# Patient Record
Sex: Female | Born: 1955 | Race: White | Hispanic: No | Marital: Married | Smoking: Never smoker
Health system: Southern US, Community
[De-identification: ages and names within clinical notes are randomized; demographics above are authoritative.]

## PROBLEM LIST (undated history)

## (undated) DIAGNOSIS — G4700 Insomnia, unspecified: Secondary | ICD-10-CM

## (undated) DIAGNOSIS — E559 Vitamin D deficiency, unspecified: Secondary | ICD-10-CM

## (undated) DIAGNOSIS — N943 Premenstrual tension syndrome: Secondary | ICD-10-CM

## (undated) DIAGNOSIS — L409 Psoriasis, unspecified: Secondary | ICD-10-CM

## (undated) DIAGNOSIS — Z9889 Other specified postprocedural states: Secondary | ICD-10-CM

## (undated) DIAGNOSIS — K219 Gastro-esophageal reflux disease without esophagitis: Secondary | ICD-10-CM

## (undated) DIAGNOSIS — J40 Bronchitis, not specified as acute or chronic: Secondary | ICD-10-CM

## (undated) DIAGNOSIS — R112 Nausea with vomiting, unspecified: Secondary | ICD-10-CM

## (undated) DIAGNOSIS — J329 Chronic sinusitis, unspecified: Secondary | ICD-10-CM

## (undated) DIAGNOSIS — K222 Esophageal obstruction: Secondary | ICD-10-CM

## (undated) DIAGNOSIS — K449 Diaphragmatic hernia without obstruction or gangrene: Secondary | ICD-10-CM

## (undated) DIAGNOSIS — M1990 Unspecified osteoarthritis, unspecified site: Secondary | ICD-10-CM

## (undated) DIAGNOSIS — E210 Primary hyperparathyroidism: Secondary | ICD-10-CM

## (undated) DIAGNOSIS — K5730 Diverticulosis of large intestine without perforation or abscess without bleeding: Secondary | ICD-10-CM

## (undated) DIAGNOSIS — Z8719 Personal history of other diseases of the digestive system: Secondary | ICD-10-CM

## (undated) DIAGNOSIS — F419 Anxiety disorder, unspecified: Secondary | ICD-10-CM

## (undated) DIAGNOSIS — J45909 Unspecified asthma, uncomplicated: Secondary | ICD-10-CM

## (undated) DIAGNOSIS — D649 Anemia, unspecified: Secondary | ICD-10-CM

## (undated) DIAGNOSIS — L4050 Arthropathic psoriasis, unspecified: Secondary | ICD-10-CM

## (undated) DIAGNOSIS — J189 Pneumonia, unspecified organism: Secondary | ICD-10-CM

## (undated) DIAGNOSIS — D131 Benign neoplasm of stomach: Secondary | ICD-10-CM

## (undated) HISTORY — DX: Other specified postprocedural states: Z98.890

## (undated) HISTORY — DX: Diverticulosis of large intestine without perforation or abscess without bleeding: K57.30

## (undated) HISTORY — DX: Unspecified asthma, uncomplicated: J45.909

## (undated) HISTORY — DX: Bronchitis, not specified as acute or chronic: J40

## (undated) HISTORY — PX: ENDOMETRIAL ABLATION: SHX621

## (undated) HISTORY — DX: Unspecified osteoarthritis, unspecified site: M19.90

## (undated) HISTORY — DX: Pneumonia, unspecified organism: J18.9

## (undated) HISTORY — PX: NASAL SINUS SURGERY: SHX719

## (undated) HISTORY — DX: Chronic sinusitis, unspecified: J32.9

## (undated) HISTORY — DX: Diaphragmatic hernia without obstruction or gangrene: K44.9

## (undated) HISTORY — DX: Vitamin D deficiency, unspecified: E55.9

## (undated) HISTORY — DX: Premenstrual tension syndrome: N94.3

## (undated) HISTORY — DX: Esophageal obstruction: K22.2

## (undated) HISTORY — DX: Gastro-esophageal reflux disease without esophagitis: K21.9

## (undated) HISTORY — DX: Arthropathic psoriasis, unspecified: L40.50

## (undated) HISTORY — DX: Psoriasis, unspecified: L40.9

## (undated) HISTORY — DX: Anxiety disorder, unspecified: F41.9

## (undated) HISTORY — DX: Personal history of other diseases of the digestive system: Z87.19

## (undated) HISTORY — DX: Insomnia, unspecified: G47.00

## (undated) HISTORY — DX: Primary hyperparathyroidism: E21.0

## (undated) HISTORY — DX: Benign neoplasm of stomach: D13.1

---

## 1998-04-03 ENCOUNTER — Ambulatory Visit (HOSPITAL_COMMUNITY): Admission: RE | Admit: 1998-04-03 | Discharge: 1998-04-03 | Payer: Self-pay | Admitting: Gynecology

## 1998-10-28 ENCOUNTER — Other Ambulatory Visit: Admission: RE | Admit: 1998-10-28 | Discharge: 1998-10-28 | Payer: Self-pay | Admitting: Gynecology

## 1999-06-23 ENCOUNTER — Ambulatory Visit (HOSPITAL_COMMUNITY): Admission: RE | Admit: 1999-06-23 | Discharge: 1999-06-23 | Payer: Self-pay | Admitting: Gynecology

## 1999-06-23 ENCOUNTER — Encounter: Payer: Self-pay | Admitting: Gynecology

## 1999-07-20 ENCOUNTER — Encounter: Payer: Self-pay | Admitting: Gynecology

## 1999-07-20 ENCOUNTER — Ambulatory Visit (HOSPITAL_COMMUNITY): Admission: RE | Admit: 1999-07-20 | Discharge: 1999-07-20 | Payer: Self-pay | Admitting: Gynecology

## 2000-05-26 ENCOUNTER — Other Ambulatory Visit: Admission: RE | Admit: 2000-05-26 | Discharge: 2000-05-26 | Payer: Self-pay | Admitting: Obstetrics and Gynecology

## 2000-08-22 ENCOUNTER — Encounter: Payer: Self-pay | Admitting: Obstetrics and Gynecology

## 2000-08-22 ENCOUNTER — Ambulatory Visit (HOSPITAL_COMMUNITY): Admission: RE | Admit: 2000-08-22 | Discharge: 2000-08-22 | Payer: Self-pay | Admitting: Obstetrics and Gynecology

## 2000-10-25 ENCOUNTER — Encounter: Admission: RE | Admit: 2000-10-25 | Discharge: 2001-01-23 | Payer: Self-pay | Admitting: Gynecology

## 2000-11-07 ENCOUNTER — Other Ambulatory Visit: Admission: RE | Admit: 2000-11-07 | Discharge: 2000-11-07 | Payer: Self-pay | Admitting: Gynecology

## 2001-05-01 ENCOUNTER — Inpatient Hospital Stay (HOSPITAL_COMMUNITY): Admission: AD | Admit: 2001-05-01 | Discharge: 2001-05-04 | Payer: Self-pay | Admitting: Gynecology

## 2001-05-01 ENCOUNTER — Encounter (INDEPENDENT_AMBULATORY_CARE_PROVIDER_SITE_OTHER): Payer: Self-pay

## 2001-05-01 ENCOUNTER — Encounter: Payer: Self-pay | Admitting: Gynecology

## 2001-05-05 ENCOUNTER — Encounter: Admission: RE | Admit: 2001-05-05 | Discharge: 2001-06-04 | Payer: Self-pay | Admitting: Gynecology

## 2001-06-11 ENCOUNTER — Other Ambulatory Visit: Admission: RE | Admit: 2001-06-11 | Discharge: 2001-06-11 | Payer: Self-pay | Admitting: Gynecology

## 2001-09-04 ENCOUNTER — Encounter: Payer: Self-pay | Admitting: Gynecology

## 2001-09-04 ENCOUNTER — Ambulatory Visit (HOSPITAL_COMMUNITY): Admission: RE | Admit: 2001-09-04 | Discharge: 2001-09-04 | Payer: Self-pay | Admitting: Gynecology

## 2002-09-17 ENCOUNTER — Encounter: Payer: Self-pay | Admitting: Gynecology

## 2002-09-17 ENCOUNTER — Ambulatory Visit (HOSPITAL_COMMUNITY): Admission: RE | Admit: 2002-09-17 | Discharge: 2002-09-17 | Payer: Self-pay | Admitting: Gynecology

## 2002-10-02 ENCOUNTER — Other Ambulatory Visit: Admission: RE | Admit: 2002-10-02 | Discharge: 2002-10-02 | Payer: Self-pay | Admitting: Gynecology

## 2002-12-19 HISTORY — PX: TUBAL LIGATION: SHX77

## 2003-12-30 ENCOUNTER — Other Ambulatory Visit: Admission: RE | Admit: 2003-12-30 | Discharge: 2003-12-30 | Payer: Self-pay | Admitting: Gynecology

## 2004-01-06 ENCOUNTER — Ambulatory Visit (HOSPITAL_COMMUNITY): Admission: RE | Admit: 2004-01-06 | Discharge: 2004-01-06 | Payer: Self-pay | Admitting: Gynecology

## 2004-02-27 ENCOUNTER — Ambulatory Visit (HOSPITAL_BASED_OUTPATIENT_CLINIC_OR_DEPARTMENT_OTHER): Admission: RE | Admit: 2004-02-27 | Discharge: 2004-02-27 | Payer: Self-pay | Admitting: Gynecology

## 2005-01-25 ENCOUNTER — Ambulatory Visit (HOSPITAL_COMMUNITY): Admission: RE | Admit: 2005-01-25 | Discharge: 2005-01-25 | Payer: Self-pay | Admitting: Gynecology

## 2005-02-11 ENCOUNTER — Other Ambulatory Visit: Admission: RE | Admit: 2005-02-11 | Discharge: 2005-02-11 | Payer: Self-pay | Admitting: Gynecology

## 2006-02-17 ENCOUNTER — Other Ambulatory Visit: Admission: RE | Admit: 2006-02-17 | Discharge: 2006-02-17 | Payer: Self-pay | Admitting: Gynecology

## 2006-03-06 ENCOUNTER — Ambulatory Visit (HOSPITAL_COMMUNITY): Admission: RE | Admit: 2006-03-06 | Discharge: 2006-03-06 | Payer: Self-pay | Admitting: Gynecology

## 2007-03-08 ENCOUNTER — Ambulatory Visit (HOSPITAL_COMMUNITY): Admission: RE | Admit: 2007-03-08 | Discharge: 2007-03-08 | Payer: Self-pay | Admitting: Gynecology

## 2007-03-14 ENCOUNTER — Other Ambulatory Visit: Admission: RE | Admit: 2007-03-14 | Discharge: 2007-03-14 | Payer: Self-pay | Admitting: Gynecology

## 2007-11-21 ENCOUNTER — Ambulatory Visit: Payer: Self-pay | Admitting: Internal Medicine

## 2007-12-06 ENCOUNTER — Encounter: Payer: Self-pay | Admitting: Internal Medicine

## 2007-12-06 ENCOUNTER — Ambulatory Visit: Payer: Self-pay | Admitting: Internal Medicine

## 2007-12-06 DIAGNOSIS — D131 Benign neoplasm of stomach: Secondary | ICD-10-CM | POA: Insufficient documentation

## 2007-12-06 DIAGNOSIS — K449 Diaphragmatic hernia without obstruction or gangrene: Secondary | ICD-10-CM | POA: Insufficient documentation

## 2007-12-06 DIAGNOSIS — K222 Esophageal obstruction: Secondary | ICD-10-CM | POA: Insufficient documentation

## 2007-12-19 LAB — HM COLONOSCOPY

## 2008-01-04 DIAGNOSIS — K5730 Diverticulosis of large intestine without perforation or abscess without bleeding: Secondary | ICD-10-CM | POA: Insufficient documentation

## 2008-01-04 DIAGNOSIS — K219 Gastro-esophageal reflux disease without esophagitis: Secondary | ICD-10-CM | POA: Insufficient documentation

## 2008-01-04 DIAGNOSIS — J45909 Unspecified asthma, uncomplicated: Secondary | ICD-10-CM | POA: Insufficient documentation

## 2008-03-28 ENCOUNTER — Ambulatory Visit (HOSPITAL_COMMUNITY): Admission: RE | Admit: 2008-03-28 | Discharge: 2008-03-28 | Payer: Self-pay | Admitting: Gynecology

## 2008-04-02 ENCOUNTER — Other Ambulatory Visit: Admission: RE | Admit: 2008-04-02 | Discharge: 2008-04-02 | Payer: Self-pay | Admitting: Gynecology

## 2008-04-17 LAB — CONVERTED CEMR LAB: Pap Smear: NORMAL

## 2008-09-19 ENCOUNTER — Ambulatory Visit: Payer: Self-pay | Admitting: *Deleted

## 2008-09-19 DIAGNOSIS — J309 Allergic rhinitis, unspecified: Secondary | ICD-10-CM | POA: Insufficient documentation

## 2008-09-19 DIAGNOSIS — B3731 Acute candidiasis of vulva and vagina: Secondary | ICD-10-CM | POA: Insufficient documentation

## 2008-09-19 DIAGNOSIS — M1990 Unspecified osteoarthritis, unspecified site: Secondary | ICD-10-CM | POA: Insufficient documentation

## 2008-09-19 DIAGNOSIS — B373 Candidiasis of vulva and vagina: Secondary | ICD-10-CM | POA: Insufficient documentation

## 2008-09-19 DIAGNOSIS — G4700 Insomnia, unspecified: Secondary | ICD-10-CM | POA: Insufficient documentation

## 2008-09-19 DIAGNOSIS — N943 Premenstrual tension syndrome: Secondary | ICD-10-CM | POA: Insufficient documentation

## 2008-10-16 ENCOUNTER — Ambulatory Visit: Payer: Self-pay | Admitting: *Deleted

## 2008-10-16 DIAGNOSIS — F329 Major depressive disorder, single episode, unspecified: Secondary | ICD-10-CM | POA: Insufficient documentation

## 2008-10-16 DIAGNOSIS — F419 Anxiety disorder, unspecified: Secondary | ICD-10-CM | POA: Insufficient documentation

## 2008-10-16 DIAGNOSIS — F3289 Other specified depressive episodes: Secondary | ICD-10-CM | POA: Insufficient documentation

## 2008-10-16 LAB — CONVERTED CEMR LAB
ALT: 24 units/L (ref 0–35)
AST: 20 units/L (ref 0–37)
Albumin: 3.8 g/dL (ref 3.5–5.2)
Alkaline Phosphatase: 60 units/L (ref 39–117)
BUN: 13 mg/dL (ref 6–23)
Basophils Absolute: 0.1 10*3/uL (ref 0.0–0.1)
Basophils Relative: 0.9 % (ref 0.0–3.0)
CO2: 30 meq/L (ref 19–32)
Calcium: 9.4 mg/dL (ref 8.4–10.5)
Chloride: 105 meq/L (ref 96–112)
Cholesterol: 193 mg/dL (ref 0–200)
Creatinine, Ser: 0.8 mg/dL (ref 0.4–1.2)
Eosinophils Absolute: 0.5 10*3/uL (ref 0.0–0.7)
Eosinophils Relative: 7.2 % — ABNORMAL HIGH (ref 0.0–5.0)
GFR calc Af Amer: 97 mL/min
GFR calc non Af Amer: 80 mL/min
Glucose, Bld: 101 mg/dL — ABNORMAL HIGH (ref 70–99)
HCT: 37.8 % (ref 36.0–46.0)
HDL: 48.8 mg/dL (ref >39.0)
Hemoglobin: 13.2 g/dL (ref 12.0–15.0)
LDL Cholesterol: 123 mg/dL — ABNORMAL HIGH (ref 0–99)
Lymphocytes Relative: 21.8 % (ref 12.0–46.0)
MCHC: 34.9 g/dL (ref 30.0–36.0)
MCV: 84.8 fL (ref 78.0–100.0)
Monocytes Absolute: 0.6 10*3/uL (ref 0.1–1.0)
Monocytes Relative: 9.1 % (ref 3.0–12.0)
Neutro Abs: 4 10*3/uL (ref 1.4–7.7)
Neutrophils Relative %: 61 % (ref 43.0–77.0)
Platelets: 229 10*3/uL (ref 150–400)
Potassium: 4.5 meq/L (ref 3.5–5.1)
RBC: 4.46 M/uL (ref 3.87–5.11)
RDW: 13 % (ref 11.5–14.6)
Sodium: 141 meq/L (ref 135–145)
TSH: 0.86 microintl units/mL (ref 0.35–5.50)
Total Bilirubin: 0.5 mg/dL (ref 0.3–1.2)
Total CHOL/HDL Ratio: 4
Total Protein: 7.1 g/dL (ref 6.0–8.3)
Triglycerides: 108 mg/dL (ref 0–149)
VLDL: 22 mg/dL (ref 0–40)
WBC: 6.6 10*3/uL (ref 4.5–10.5)

## 2008-10-20 DIAGNOSIS — E739 Lactose intolerance, unspecified: Secondary | ICD-10-CM | POA: Insufficient documentation

## 2008-11-25 ENCOUNTER — Ambulatory Visit: Payer: Self-pay | Admitting: *Deleted

## 2008-12-03 ENCOUNTER — Telehealth (INDEPENDENT_AMBULATORY_CARE_PROVIDER_SITE_OTHER): Payer: Self-pay | Admitting: *Deleted

## 2008-12-08 ENCOUNTER — Telehealth (INDEPENDENT_AMBULATORY_CARE_PROVIDER_SITE_OTHER): Payer: Self-pay | Admitting: *Deleted

## 2008-12-09 ENCOUNTER — Ambulatory Visit: Payer: Self-pay | Admitting: Gynecology

## 2008-12-16 ENCOUNTER — Ambulatory Visit: Payer: Self-pay | Admitting: *Deleted

## 2009-01-02 ENCOUNTER — Telehealth (INDEPENDENT_AMBULATORY_CARE_PROVIDER_SITE_OTHER): Payer: Self-pay | Admitting: *Deleted

## 2009-04-10 ENCOUNTER — Telehealth: Payer: Self-pay | Admitting: Internal Medicine

## 2009-04-16 ENCOUNTER — Ambulatory Visit: Payer: Self-pay | Admitting: Internal Medicine

## 2009-06-26 ENCOUNTER — Telehealth: Payer: Self-pay | Admitting: Internal Medicine

## 2009-07-17 ENCOUNTER — Ambulatory Visit (HOSPITAL_COMMUNITY): Admission: RE | Admit: 2009-07-17 | Discharge: 2009-07-17 | Payer: Self-pay | Admitting: Gynecology

## 2009-07-17 LAB — HM MAMMOGRAPHY: HM Mammogram: NORMAL

## 2009-07-21 ENCOUNTER — Encounter: Payer: Self-pay | Admitting: Gynecology

## 2009-07-21 ENCOUNTER — Ambulatory Visit: Payer: Self-pay | Admitting: Gynecology

## 2009-07-21 ENCOUNTER — Other Ambulatory Visit: Admission: RE | Admit: 2009-07-21 | Discharge: 2009-07-21 | Payer: Self-pay | Admitting: Gynecology

## 2009-07-21 LAB — CONVERTED CEMR LAB

## 2009-07-23 ENCOUNTER — Ambulatory Visit: Payer: Self-pay | Admitting: Internal Medicine

## 2009-07-23 DIAGNOSIS — G4700 Insomnia, unspecified: Secondary | ICD-10-CM | POA: Insufficient documentation

## 2009-08-27 ENCOUNTER — Telehealth: Payer: Self-pay | Admitting: Internal Medicine

## 2009-09-25 ENCOUNTER — Ambulatory Visit: Payer: Self-pay | Admitting: Internal Medicine

## 2009-09-29 ENCOUNTER — Telehealth: Payer: Self-pay | Admitting: Internal Medicine

## 2009-11-18 HISTORY — PX: TOTAL KNEE ARTHROPLASTY: SHX125

## 2009-11-26 ENCOUNTER — Ambulatory Visit: Payer: Self-pay | Admitting: Internal Medicine

## 2010-01-22 ENCOUNTER — Ambulatory Visit: Payer: Self-pay | Admitting: Internal Medicine

## 2010-01-29 ENCOUNTER — Telehealth: Payer: Self-pay | Admitting: Internal Medicine

## 2010-05-03 ENCOUNTER — Ambulatory Visit: Payer: Self-pay | Admitting: Family

## 2010-06-10 ENCOUNTER — Ambulatory Visit: Payer: Self-pay | Admitting: Internal Medicine

## 2010-06-10 DIAGNOSIS — N390 Urinary tract infection, site not specified: Secondary | ICD-10-CM | POA: Insufficient documentation

## 2010-06-10 LAB — CONVERTED CEMR LAB
Bilirubin Urine: NEGATIVE
Glucose, Urine, Semiquant: NEGATIVE
Ketones, urine, test strip: NEGATIVE
Nitrite: NEGATIVE
Protein, U semiquant: 100
Specific Gravity, Urine: 1.01
Urobilinogen, UA: 0.2
pH: 5

## 2010-06-11 ENCOUNTER — Encounter: Payer: Self-pay | Admitting: Internal Medicine

## 2010-07-16 ENCOUNTER — Telehealth: Payer: Self-pay | Admitting: Internal Medicine

## 2010-07-16 ENCOUNTER — Ambulatory Visit: Payer: Self-pay | Admitting: Internal Medicine

## 2010-07-16 LAB — CONVERTED CEMR LAB
Bilirubin Urine: NEGATIVE
Glucose, Urine, Semiquant: NEGATIVE
Ketones, urine, test strip: NEGATIVE
Nitrite: NEGATIVE
Protein, U semiquant: 30
Specific Gravity, Urine: 1.01
Urobilinogen, UA: 0.2
pH: 7

## 2010-08-30 ENCOUNTER — Ambulatory Visit: Payer: Self-pay | Admitting: Internal Medicine

## 2010-09-17 ENCOUNTER — Ambulatory Visit: Payer: Self-pay | Admitting: Family

## 2010-11-15 ENCOUNTER — Telehealth: Payer: Self-pay | Admitting: Family

## 2010-12-02 ENCOUNTER — Other Ambulatory Visit
Admission: RE | Admit: 2010-12-02 | Discharge: 2010-12-02 | Payer: Self-pay | Source: Home / Self Care | Admitting: Gynecology

## 2010-12-02 ENCOUNTER — Ambulatory Visit: Payer: Self-pay | Admitting: Gynecology

## 2010-12-16 ENCOUNTER — Ambulatory Visit: Payer: Self-pay | Admitting: Gynecology

## 2010-12-16 ENCOUNTER — Ambulatory Visit (HOSPITAL_COMMUNITY)
Admission: RE | Admit: 2010-12-16 | Discharge: 2010-12-16 | Payer: Self-pay | Source: Home / Self Care | Attending: Gynecology | Admitting: Gynecology

## 2010-12-17 ENCOUNTER — Ambulatory Visit: Admit: 2010-12-17 | Payer: Self-pay | Admitting: Family

## 2010-12-17 ENCOUNTER — Ambulatory Visit: Payer: Self-pay | Admitting: Gynecology

## 2011-01-10 ENCOUNTER — Encounter: Payer: Self-pay | Admitting: Gynecology

## 2011-01-14 ENCOUNTER — Ambulatory Visit: Admit: 2011-01-14 | Payer: Self-pay | Admitting: Gynecology

## 2011-01-16 LAB — CONVERTED CEMR LAB: Glucose, Bld: 99 mg/dL (ref 70–99)

## 2011-01-20 NOTE — Assessment & Plan Note (Signed)
Summary: uti/mhf   Vital Signs:  Patient profile:   55 year old female Height:      65.5 inches Weight:      186.50 pounds BMI:     30.67 O2 Sat:      99 % on Room air Temp:     97.5 degrees F oral Pulse rate:   86 / minute Pulse rhythm:   regular Resp:     18 per minute BP sitting:   106 / 70  (right arm) Cuff size:   large  Vitals Entered By: Glendell Docker CMA (June 10, 2010 2:19 PM)  O2 Flow:  Room air CC: Rm 3-  pain with urination, Dysuria Comments pain with urination , frequency and urgency onset 3 days ago, no longer taking Hydrocodone and Azithromycin is completed   Primary Care Provider:  DThomos Lemons DO  CC:  Rm 3-  pain with urination and Dysuria.  History of Present Illness:  Dysuria      This is a 55 year old woman who presents with Dysuria.  The symptoms began 3 days ago.  The patient reports urinary frequency.  The patient denies the following associated symptoms: vomiting, fever, shaking chills, and flank pain.    Allergies: 1)  ! 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   Lungs:  normal respiratory effort and normal breath sounds.   Heart:  normal rate, regular rhythm, and no gallop.   Abdomen:  soft, non-tender, and no masses.  no flank tenderness Extremities:  No lower extremity edema    Impression &  Recommendations:  Problem # 1:  UTI (ICD-599.0)  The following medications were removed from the medication list:    Azithromycin 250 Mg Tabs (Azithromycin) .Marland Kitchen... 2 tabs on day 1, then one by mouth once daily Her updated medication list for this problem includes:    Ciprofloxacin Hcl 500 Mg Tabs (Ciprofloxacin hcl) ..... One by mouth two times a day  Orders: T-Culture, Urine ([id number]) UA Dipstick w/o Micro (manual) (08657) Specimen Handling (99000)  Encouraged to push clear liquids, get enough rest, and take acetaminophen as needed. To be seen in 10 days if no improvement, sooner if worse.  Complete Medication List: 1)  Nexium 40 Mg Cpdr (Esomeprazole magnesium) .... Take 1 cap by mouth once a day 2)  Trazodone Hcl 100 Mg Tabs (Trazodone hcl) .... Take 1/2 to one  tab by mouth at bedtime as needed 3)  Singulair 10 Mg Tabs (Montelukast sodium) .... Take 1 tablet by mouth once a day 4)  Alprazolam 0.25 Mg Tabs (Alprazolam) .... As needed 5)  Spironolactone 25 Mg Tabs (Spironolactone) .... Take 1 tablet by mouth once a day as needed 6)  Meloxicam 7.5 Mg Tabs (Meloxicam) .... Take 1 tablet by mouth once a day 7)  Zyrtec Allergy 10 Mg Caps (Cetirizine hcl) .... Take 1 capsule by mouth once a day as needed 8)  Hydrocodone-acetaminophen  7.5-325 Mg Tabs (Hydrocodone-acetaminophen) .... One tablet every 6 hours as needed pain 9)  Ciprofloxacin Hcl 500 Mg Tabs (Ciprofloxacin hcl) .... One by mouth two times a day  Patient Instructions: 1)  Call our office if your symptoms do not  improve or gets worse. Prescriptions: CIPROFLOXACIN HCL 500 MG TABS (CIPROFLOXACIN HCL) one by mouth two times a day  #14 x 0   Entered and Authorized by:   D. Thomos Lemons DO   Signed by:   D. Thomos Lemons DO on 06/10/2010   Method used:   [facility] to        [facility]  [address] [phone]* (retail)       [address]       [place], [place]  [address]       Ph: [phone] or [phone]        Fax: [phone]   RxID:   [id number]   Laboratory Results   Urine Tests    Routine Urinalysis   Color: yellow Appearance: Clear Glucose: negative   (Normal Range: Negative) Bilirubin: negative   (Normal Range: Negative) Ketone: negative   (Normal Range: Negative) Spec. Gravity: 1.010   (Normal Range: 1.003-1.035) Blood: large   (Normal Range: Negative) pH: 5.0   (Normal Range: 5.0-8.0) Protein: 100   (Normal Range: Negative) Urobilinogen: 0.2   (Normal Range: 0-1) Nitrite: negative   (Normal Range: Negative) Leukocyte Esterace: large   (Normal Range: Negative)

## 2011-01-20 NOTE — Procedures (Signed)
Summary: Gastroenterology EGD  Gastroenterology EGD   Imported By: Lorre Munroe RN 01/07/2008 15:31:34  _____________________________________________________________________  External Attachment:    Type:   Image     Comment:   External Document

## 2011-01-20 NOTE — Progress Notes (Signed)
Summary: REPLACEMENT RX  Phone Note Refill Request Message from:  Fax from Pharmacy on November 15, 2010 3:01 PM  Refills Requested: Medication #1:  CLOTRIMAZOLE-BETAMETHASONE 1-0.05 % CREA apply two times a day as directed   Dosage confirmed as above?Dosage Confirmed   Brand Name Necessary? No   Supply Requested: 1 month THIS IS ON MANUFACTURERS BACK ORDER TILL NEXT YEAR.  PLEASE SEND RX FOR SUBSTITUTE [facility] FAX [phone] PHONE [phone]   Method Requested: Electronic Next Appointment Scheduled: NONE Initial call taken by: Roselle Locus,  November 15, 2010 3:02 PM  Follow-up for Phone Call        Please call patient and ask her what she is using cream for and I will try to come up with an alternative.  This one is on back order at the pharmacy. Follow-up by: Lemont Fillers FNP,  November 15, 2010 4:00 PM  Additional Follow-up for Phone Call Additional follow up Details #1::        Left message on machine to return my call. Nicki Guadalajara Fergerson CMA Duncan Dull)  November 15, 2010 4:20 PM     Additional Follow-up for Phone Call Additional follow up Details #2::    Pt states she uses this for eczema but was told the Clarithromycin was on back order as she was requesting a refill on the antibiotic, not the cream. [facility] and [facility] verified that Clotrimazole Betameth Cream is not on back order but the Clarithromycin is. No substitute given for antibiotic as pt will need to be seen. Pt is aware and states she will call us back if she feels she needs an appt.    Nicki Guadalajara Fergerson CMA Duncan Dull)  November 16, 2010 10:54 AM

## 2011-01-20 NOTE — Progress Notes (Signed)
Summary: Trazadone  Phone Note Refill Request Message from:  Fax from Pharmacy on June 26, 2009 4:53 PM  Refills Requested: Medication #1:  TRAZODONE HCL 100 MG TABS one tab by mouth at bedtime   Dosage confirmed as above?Dosage Confirmed   Brand Name Necessary? No   Supply Requested: 3 months   Last Refilled: 03/23/2009  Method Requested: Telephone to Pharmacy Next Appointment Scheduled: No Future Appointments on file Initial call taken by: Glendell Docker CMA,  June 26, 2009 4:53 PM  Follow-up for Phone Call        refill x 1 month only.  needs OV for refills Follow-up by: D. Thomos Lemons DO,  June 26, 2009 4:59 PM  Additional Follow-up for Phone Call Additional follow up Details #1::        Rx called to pharmacy    Additional Follow-up for Phone Call Additional follow up Details #2::    Patient advised per Dr Artist Pais instructions, she states that she has been taking this medication for years and it is more cost effective for her to get a 90 day supply. She was made aware that office visit is needed for refills patient is not in agreement, she states she has felt like she has been seen on a regualr basis.   Follow-up by: Glendell Docker CMA,  June 29, 2009 11:58 AM  Additional Follow-up for Phone Call Additional follow up Details #3:: Details for Additional Follow-up Action Taken: [facility] for 90 day supply with 1 refill Additional Follow-up by: D. Thomos Lemons DO,  July 13, 2009 9:23 AM  New/Updated Medications: TRAZODONE HCL 100 MG TABS (TRAZODONE HCL) Take 1 tab by mouth at bedtime as needed  Rx changed to 90 day supply per Dr Artist Pais ok, patient has been advised Glendell Docker CMA  July 13, 2009 10:54 AM   Prescriptions: TRAZODONE HCL 100 MG TABS (TRAZODONE HCL) Take 1 tab by mouth at bedtime as needed  #90 x 1   Entered by:   Glendell Docker CMA   Authorized by:   D. Thomos Lemons DO   Signed by:   Glendell Docker CMA on 07/13/2009   Method used:   Telephoned to ...       [facility]  [address]  [phone]* (retail)       [address]       [place], [place]  [address]       Ph: [phone] or [phone]       Fax: [phone]   RxID:   [id number] TRAZODONE HCL 100 MG TABS (TRAZODONE HCL) Take 1 tab by mouth at bedtime as needed  #30 x 0   Entered by:   Glendell Docker CMA   Authorized by:   D. Thomos Lemons DO   Signed by:   Glendell Docker CMA on 06/29/2009   Method used:   Telephoned to ...       [facility]  [address] [phone]* (retail)       [address]       [place], [place]  [address]       Ph: [phone] or [phone]       Fax: [phone]   RxID:   [id number]

## 2011-01-20 NOTE — Assessment & Plan Note (Signed)
Summary: f/u, fasting labs   Vital Signs:  Patient Profile:   55 Years Old Female Height:     65.5 inches Weight:      189 pounds BMI:     31.08 O2 treatment:    Room Air Temp:     97.7 degrees F oral Pulse rate:   80 / minute Pulse rhythm:   regular Resp:     20 per minute BP sitting:   130 / 84  (left arm) Cuff size:   large  Vitals Entered By: Darra Lis RMA (December 16, 2008 9:09 AM)                 Visit Type:  follow up PCP:  Paulo Fruit MD  Chief Complaint:  follow up.  History of Present Illness: patient had labs done in October which showed an elevated glucose - see labs below - and gave her a diagnosis of glucose intolerance.  She is here to repeat the fasting glucose level.  She is working on Patent examiner.  Her lipid levels were stable and acceptable with a slightly elevated LDL - but good HDL and risk ratio, her CBC and TSH were within normal limits and her CMP was within normal limits except for the glucose level.  Patient is seeing an Orthopedist for her right  knee - severe arthritis.  She has had a steroid injection and had physical therapy.  she is having some improvement.  She has a follow up with ortho in 6 months.  Patient is using trazodone for both insomnia and mild depression.  she says it is helping with both very well.    Patient is on singulair and zyrtec for her allergies.  They are working well.  GERD symptoms are stable on current meds.  Patient also requests a screening EKG because she says she checked with her insurance and they will cover it.  She has no chest pain or other cardiac complaints/symptoms.  She understands that often times insurance companies do not cover a screening EKG and that if her insurance denies coverage, she will be responsible for the cost.   Tests: (1) LIPID PROFILE (LIPID)   CHOLESTEROL               193 mg/dL                   1-610       ATP III Classification:             < 200       mg/dL       Desireable            200 - 239    mg/dL      Borderline High            > = 240      mg/dL      High   TRIGLYCERIDES             108 mg/dL                   9-604        Normal:  < 150 mg/dL        Borderline High:  150 - 199 mg/dL   HDL                       54.0 mg/dL                  >  39.0   VLDL CHOLESTEROL          22 mg/dl                    2-95   LDL CHOLESTEROL      [H]  621 mg/dl                   3-08  CHOL/HDL Ratio: CHD Risk                             4.0 CALC  Tests: (2) CBC WITH DIFF (CBCD)   WHITE CELL COUNT          6.6 K/uL                    4.5-10.5   RED CELL COUNT            4.46 Mil/uL                 3.87-5.11   HEMOGLOBIN                13.2 g/dL                   65.7-84.6   HEMATOCRIT                37.8 %                      36.0-46.0   MCV                       84.8 fl                     78.0-100.0   MCHC                      34.9 g/dL                   96.2-95.2   RDW                       13.0 %                      11.5-14.6   PLATELET COUNT            229 K/uL                    150-400   NEUTROPHIL %              61.0 %                      43.0-77.0   LYMPHOCYTE %              21.8 %                      12.0-46.0   MONOCYTE %                9.1 %                       3.0-12.0   EOSINOPHILS %        [H]  7.2 %  0.0-5.0   BASOPHILS %               0.9 %                       0.0-3.0  NEUTROPHILS, ABSOLUTE                             4.0 K/uL                    1.4-7.7   MONOCYTES, ABSOLUTE       0.6 K/uL                    0.1-1.0  EOSINOPHILS, ABSOLUTE                             0.5 K/uL                    0.0-0.7   BASOPHILS, ABSOLUTE       0.1 K/uL                    0.0-0.1  Tests: (3) COMPREHENSIVE PANEL (COMP)   SODIUM                    141 mEq/L                   135-145   POTASSIUM                 4.5 mEq/L                   3.5-5.1   CHLORIDE                  105 mEq/L                   96-112   CARBON DIOXIDE             30 mEq/L                    19-32   GLUCOSE              [H]  101 mg/dL                   21-30   BUN                       13 mg/dL                    8-65   CREATININE                0.8 mg/dL                   7.8-4.6   TOTAL BILIRUBIN           0.5 mg/dL                   9.6-2.9   ALKALINE PHOSPHATASE      60 U/L                      39-117   SGOT (AST)                20 U/L  0-37   SGPT (ALT)                24 U/L                      0-35   TOTAL PROTEIN             7.1 g/dL                    0.9-8.1   ALBUMIN                   3.8 g/dL                    1.9-1.4   CALCIUM                   9.4 mg/dL                   7.8-29.5  GFR (AFRICAN AMERICAN)                             97 mL/min  GFR (NON-AFRICAN AMERICAN)                             80 mL/min  Tests: (4) FastTSH (TSH)   FastTSH                   0.86 uIU/mL                 0.35-5.50   Updated Prior Medication List: NEXIUM 40 MG CPDR (ESOMEPRAZOLE MAGNESIUM) Take 1 tablet by mouth once a day TRAZODONE HCL 100 MG TABS (TRAZODONE HCL) one tab by mouth at bedtime SINGULAIR 10 MG TABS (MONTELUKAST SODIUM) Take 1 tablet by mouth once a day ALPRAZOLAM 0.25 MG TABS (ALPRAZOLAM) as needed SPIRONOLACTONE 25 MG TABS (SPIRONOLACTONE) as needed MELOXICAM 7.5 MG TABS (MELOXICAM) Take 1 tablet by mouth once a day ZYRTEC ALLERGY 10 MG TABS (CETIRIZINE HCL) one tab by mouth once daily LEVAQUIN 750 MG TABS (LEVOFLOXACIN) 1 tab by mouth x 5 days  Current Allergies (reviewed today): ! PCN  Past Medical History:    Reviewed history from 11/25/2008 and no changes required:       PMS (ICD-625.4) - gyn uses alprazolam and spironolactone prn for treatment       GASTRIC POLYP (ICD-211.1)       ESOPHAGEAL STRICTURE (ICD-530.3)       HIATAL HERNIA (ICD-553.3)       DIVERTICULOSIS, COLON (ICD-562.10)       ASTHMA (ICD-493.90)       recurrent bronchitis       GERD (ICD-530.81)  - sees GI       Allergic  rhinitis       recurrent sinusitis       Osteoarthritis - sees ortho - has had steroid shot in the knee  Past Surgical History:    Reviewed history from 01/04/2008 and no changes required:       tubal ligation and ablation 2004       sinus surgery     Review of Systems       no nausea / vomiting / diarrhea / fever / chills / chest pain / SOB    Physical Exam  General:     Well-developed, well nourished, well hydrated, in no acute distress, appropriate  and cooperative   Head:     Normocephalic and atraumatic without obvious abnormalities.  Eyes:     No corneal or conjunctival inflammation. EOMI. PERRLA. Vision grossly normal. Ears:     External ear shows no significant lesions or deformities.  Hearing is grossly normal. Nose:     External nasal examination shows no deformity or inflammation. Neck:     supple, full ROM Lungs:     Normal respiratory effort, chest expands symmetrically with good air flow noted. Lungs without rales or wheezes, or crackles.   Heart:     Normal rate and  rhythm. S1 and S2 normal, without gallop, murmur, click, rub  Extremities:     No clubbing, cyanosis, edema, or deformity noted, grossly normal full range of motion with all joints - except R knee with sl decrease - her baseline   Neurologic:     alert & oriented X3,  CN II-XII intact, gait normal.  no deficit in strength noted, no balance problems noted, neuro is grossly intact Skin:     Intact without suspicious lesions or rashes Psych:     Cognition and judgment appear intact. Alert and cooperative with normal attention span and concentration. No apparent delusions, illusions, hallucinations, normally interactive, good eye contact, not anxious or agitated.       Impression & Recommendations:  Problem # 1:  GLUCOSE INTOLERANCE (ICD-271.3) time to recheck her sugar level.  reviewed treatment protocol, possible progression, etc patient to return in 3-4 months and will recheck again.   patient is going to work on exercise / diet / weight loss Orders: TLB-Glucose, QUANT ([id number])   Problem # 2:  DEPRESSION, MILD (ICD-311) continue with current meds Her updated medication list for this problem includes:    Trazodone Hcl 100 Mg Tabs (Trazodone hcl) ..... One tab by mouth at bedtime    Alprazolam 0.25 Mg Tabs (Alprazolam) .Marland Kitchen... As needed   Problem # 3:  INSOMNIA (ICD-780.52) continue with current meds  Problem # 4:  GERD (ICD-530.81) continue with current meds Her updated medication list for this problem includes:    Nexium 40 Mg Cpdr (Esomeprazole magnesium) .Marland Kitchen... Take 1 tablet by mouth once a day   Problem # 5:  ALLERGIC RHINITIS (ICD-477.9) continue with current meds Her updated medication list for this problem includes:    Zyrtec Allergy 10 Mg Tabs (Cetirizine hcl) ..... One tab by mouth once daily   Problem # 6:  OSTEOARTHRITIS (ICD-715.90) She will continjue with ortho treament plan. Her updated medication list for this problem includes:    Meloxicam 7.5 Mg Tabs (Meloxicam) .Marland Kitchen... Take 1 tablet by mouth once a day   Problem # 7:  SCREENING FOR ISCHEMIC HEART DISEASE (ICD-V81.0) Patient also requests a screening EKG because she says she checked with her insurance and they will cover it.  She has no chest pain or other cardiac complaints/symptoms.  She understands that often times insurance companies do not cover a screening EKG and that if her insurance denies coverage, she will be responsible for the cost.  The EKG shows NSR with 70bpm and no ST elevation/depression or acute changes. Orders: EKG w/ Interpretation (93000)   Complete Medication List: 1)  Nexium 40 Mg Cpdr (Esomeprazole magnesium) .... Take 1 tablet by mouth once a day 2)  Trazodone Hcl 100 Mg Tabs (Trazodone hcl) .... One tab by mouth at bedtime 3)  Singulair 10 Mg Tabs (Montelukast sodium) .... Take 1 tablet by mouth once a day 4)  Alprazolam 0.25 Mg Tabs (Alprazolam) .... As  needed 5)  Spironolactone 25 Mg Tabs (Spironolactone) .... As needed 6)  Meloxicam 7.5 Mg Tabs (Meloxicam) .... Take 1 tablet by mouth once a day 7)  Zyrtec Allergy 10 Mg Tabs (Cetirizine hcl) .... One tab by mouth once daily   Patient Instructions: 1)  Please schedule a follow-up appointment in 3-4 months with MD in the am / fasting.  NOT just a lab appt.    ]

## 2011-01-20 NOTE — Progress Notes (Signed)
Summary: Phone note  Phone Note Call from Patient   Caller: Patient Reason for Call: Refill Medication Summary of Call: patient needs refill on trazodone.  she wants it changed to 100mg  because she takes 2 of the 50mg  tabs every night.  #90/1RF.   Initial call taken by: Paulo Fruit MD,  December 03, 2008 4:28 PM    New/Updated Medications: TRAZODONE HCL 100 MG TABS (TRAZODONE HCL) one tab by mouth at bedtime   Prescriptions: TRAZODONE HCL 100 MG TABS (TRAZODONE HCL) one tab by mouth at bedtime  #90 x 1   Entered and Authorized by:   Paulo Fruit MD   Signed by:   Paulo Fruit MD on 12/03/2008   Method used:   [facility]        [facility]  [address] [phone]* (retail)       [address]       [place], [place]  [address]       Ph: [phone] or [phone]       Fax: [phone]   RxID:   [id number]

## 2011-01-20 NOTE — Progress Notes (Signed)
Summary: ? Medication Reaction  ---- Converted from flag ---- ---- 09/29/2009 12:39 PM, Darral Dash wrote: Pt start medication  (  Cefuroxime 500mg  )  Sunday, face is red and itching, stopped medication.  Please advise    #[phone] ------------------------------  Phone Note Call from Patient Call back at Home Phone [phone]   Summary of Call: CALLED PATIENT SHE IS LONGER RED AND ITCHY  Initial call taken by: Marjorie Farquhar,  October 02, 2009 8:40 AM  Follow-up for Phone Call        recieved a flag from Helen regarding possible allegic reaction to Cefuroxine. Patient called and stated face was red and irritated. Call placed to patient at [phone], no answer voice message left informing patient call returned Follow-up by: Darlene Knight CMA,  September 29, 2009 5:56 PM  Additional Follow-up for Phone Call Additional follow up Details #1::        patient states that she had real red face and skin irritation, she states that it happened 3 days after the onset of the medication. She has taken Benadryl with some relief.   She took 2 days of medication and stopped. She does not feel any better ,and would like to know if Dr Tallen Schnorr would prescribed something different Additional Follow-up by: Darlene Knight CMA,  October 01, 2009 8:22 AM   New Allergies: CEFUROXIME AXETIL (CEFUROXIME AXETIL) Additional Follow-up for Phone Call Additional follow up Details #2::    If pt has still has red face, I need to see her in the office Follow-up by: D. Robert Sinai Illingworth DO,  October 01, 2009 9:54 AM  Additional Follow-up for Phone Call Additional follow up Details #3:: Details for Additional Follow-up Action Taken: see rx.  I suggest OV if pt not feeling better. Additional Follow-up by: D. Robert Shalaine Payson DO,  October 02, 2009 11:49 AM  New/Updated Medications: AZITHROMYCIN 250 MG TABS (AZITHROMYCIN) 2 tabs by mouth x 1, then one by mouth once daily x 4 days New Allergies: CEFUROXIME AXETIL (CEFUROXIME  AXETIL)Prescriptions: AZITHROMYCIN 250 MG TABS (AZITHROMYCIN) 2 tabs by mouth x 1, then one by mouth once daily x 4 days  #6 x 0   Entered and Authorized by:   D. Robert Nonnie Pickney DO   Signed by:   D. Robert Sandford Diop DO on 10/02/2009   Method used:   [facility]        [facility]  [address] [id number]       [address]       [place], [place]  [address]       Ph: [phone] or [phone]       Fax: [phone]   RxID:   [id number]  patient advised per Dr Artist Pais instructions. She is aware that a new rx has been sent to the pharmacy for her, and if no imrpovement she is to return for follow up Glendell Docker CMA  October 02, 2009 11:59 AM

## 2011-01-20 NOTE — Assessment & Plan Note (Signed)
Summary: ?UTI / tf,cma--Rm 3   Vital Signs:  Patient profile:   55 year old female Height:      65.5 inches Weight:      187.50 pounds BMI:     30.84 O2 Sat:      100 % on Room air Temp:     97.7 degrees F oral Pulse rate:   92 / minute Pulse rhythm:   regular Resp:     18 per minute BP sitting:   100 / 70  (right arm) Cuff size:   large  Vitals Entered By: Mervin Kung CMA Duncan Dull) (July 16, 2010 2:16 PM)  O2 Flow:  Room air CC: Room 3   Pt completed Cipro last month. Still has urinary frequency and urgency., Dysuria Is Patient Diabetic? No Comments Pt states she has completed Hydrocodone-Acet and Cipro. Nicki Guadalajara Fergerson CMA [facility] Dull)  July 16, 2010 2:21 PM    Primary Care Provider:  Dondra Spry DO  CC:  Room 3   Pt completed Cipro last month. Still has urinary frequency and urgency. and Dysuria.  History of Present Illness:  Dysuria      This is a 55 year old woman who presents with Dysuria.  The patient reports urinary frequency and urgency.  The patient denies the following associated symptoms: nausea, vomiting, fever, shaking chills, and flank pain.  History is significant for > 3 UTIs in one year.   hx of stress incontinence prev rx'ed estrace crm by urologist but hasn't used regularly  Preventive Screening-Counseling & Management  Alcohol-Tobacco     Smoking Status: never  Allergies: 1)  ! Pcn 2)  Cefuroxime Axetil (Cefuroxime Axetil)  Past History:  Past Medical History: PMS (ICD-625.4) - gyn uses alprazolam and spironolactone prn for treatment GASTRIC POLYP (ICD-211.1) ESOPHAGEAL STRICTURE (ICD-530.3)  HIATAL HERNIA (ICD-553.3)  DIVERTICULOSIS, COLON (ICD-562.10) ASTHMA (ICD-493.90)  recurrent bronchitis   GERD (ICD-530.81)  - sees GI   Allergic rhinitis recurrent sinusitis Osteoarthritis - sees ortho - has had steroid shot in the knee Post menopausal symptoms  Past Surgical History: tubal ligation and ablation 2004 sinus surgery        Right  knee replacement 11/2009   Physical Exam  General:  alert, well-developed, and well-nourished.   Lungs:  normal respiratory effort and normal breath sounds.   Heart:  normal rate, regular rhythm, and no gallop.   Abdomen:  soft and non-tender.  no flank tenderness   Impression & Recommendations:  Problem # 1:  UTI (ICD-599.0) freq UTIs.  use estrace crm.  perform kegel exercises Patient advised to call office if symptoms persist or worsen.  Her updated medication list for this problem includes:    Ciprofloxacin Hcl 500 Mg Tabs (Ciprofloxacin hcl) ..... One by mouth two times a day  Encouraged to push clear liquids, get enough rest, and take acetaminophen as needed. To be seen in 10 days if no improvement, sooner if worse.  Complete Medication List: 1)  Nexium 40 Mg Cpdr (Esomeprazole magnesium) .... Take 1 cap by mouth once a day 2)  Trazodone Hcl 100 Mg Tabs (Trazodone hcl) .... Take 1/2 to one  tab by mouth at bedtime as needed 3)  Singulair 10 Mg Tabs (Montelukast sodium) .... Take 1 tablet by mouth once a day 4)  Alprazolam 0.25 Mg Tabs (Alprazolam) .... As needed 5)  Spironolactone 25 Mg Tabs (Spironolactone) .... Take 1 tablet by mouth once a day as needed 6)  Meloxicam 7.5 Mg Tabs (Meloxicam) .Marland KitchenMarland KitchenMarland Kitchen  Take 1 tablet by mouth once a day 7)  Zyrtec Allergy 10 Mg Caps (Cetirizine hcl) .... Take 1 capsule by mouth once a day as needed 8)  Hydrocodone-acetaminophen 7.5-325 Mg Tabs (Hydrocodone-acetaminophen) .... One tablet every 6 hours as needed pain 9)  Ciprofloxacin Hcl 500 Mg Tabs (Ciprofloxacin hcl) .... One by mouth two times a day 10)  Estrace 0.1 Mg/gm Crea (Estradiol) .Marland Kitchen.. 1g pv 2-3 x per week 11)  Clotrimazole-betamethasone 1-0.05 % Crea (Clotrimazole-betamethasone) .... Apply two times a day as directed  Other Orders: T-Culture, Urine ([id number]) Specimen Handling (46270) UA Dipstick w/o Micro (manual) (35009)  Patient Instructions: 1)  Perform Kegel exercises 2)   Call our office if your symptoms do not  improve or gets worse. Prescriptions: CLOTRIMAZOLE-BETAMETHASONE 1-0.05 % CREA (CLOTRIMAZOLE-BETAMETHASONE) apply two times a day as directed  #30 gms x 0   Entered and Authorized by:   D. Thomos Lemons DO   Signed by:   D. Thomos Lemons DO on 07/16/2010   Method used:   Electronically to        [facility]  [address] [phone][facility] (retail)       [address]       [place], [place]  [address]       Ph: [phone] or [phone]       Fax: [phone]   RxID:   [id number] ESTRACE 0.1 MG/GM CREA (ESTRADIOL) 1g PV 2-3 x per week  #1 month x 3   Entered and Authorized by:   D. Thomos Lemons DO   Signed by:   D. Thomos Lemons DO on 07/16/2010   Method used:   [facility]ronically to        [facility]  [address] [phone]* (retail)       [address]       [place], [place]  [address]       Ph: [phone] or [phone]       Fax: [phone]   RxID:   [id number] CIPROFLOXACIN HCL 500 MG TABS (CIPROFLOXACIN HCL) one by mouth two times a day  #14 x 0   Entered and Authorized by:   D. Thomos Lemons DO   Signed by:   D. Thomos Lemons DO on 07/16/2010   Method used:   [facility]        [facility]  [address] [phone]* (retail)       [address]       [place], [place]  [address]       Ph: [phone] or [phone]       Fax: [phone]   RxID:   [id number]    Current Allergies (reviewed today): ! PCN CEFUROXIME AXETIL (CEFUROXIME AXETIL)  Laboratory Results   Urine Tests    Routine Urinalysis   Color: yellow Appearance: Hazy Glucose: negative   (Normal Range: Negative) Bilirubin: negative   (Normal Range: Negative) Ketone: negative   (Normal Range: Negative) Spec. Gravity: 1.010   (Normal Range: 1.003-1.035) Blood: large   (Normal Range: Negative) pH: 7.0   (Normal Range: 5.0-8.0) Protein: 30   (Normal Range: Negative) Urobilinogen: 0.2   (Normal Range: 0-1) Nitrite: negative   (Normal Range:  Negative) Leukocyte Esterace: moderate   (Normal Range: Negative)    Comments: Urine cultured. Nicki Guadalajara Fergerson CMA [facility])  July 16, 2010 2:27 PM

## 2011-01-20 NOTE — Assessment & Plan Note (Signed)
Summary: ?SINUS INFECTION-[facility] And[facility] Campanile   Vital Signs:  Patient Profile:   55 Years Old Female Height:     65.5 inches Weight:      191 pounds BMI:     31.41 O2 treatment:    Room Air Temp:     97.7 degrees F oral Pulse rate:   84 / minute Pulse rhythm:   regular Resp:     20 per minute BP sitting:   130 / 84  (left arm) Cuff size:   large  Vitals Entered By: Darra Lis RMA (November 25, 2008 11:38 AM)                 PCP:  Paulo Fruit MD  Chief Complaint:  Sinusitis and URI symptoms.  History of Present Illness: Patient c/o of increasing sinus headaches, lightheadedness with sinus drainage and coughing x one week. Patient with a history of recurrent sinus infections in the past.  Z-paks have not worked for her.  The only thing that has worked well in the past is Levaquin.     The patient reports nasal congestion, clear nasal discharge, sore throat, dry cough, earache, and sick contacts.  The patient denies fever, wheezing, rash, vomiting, and diarrhea.  The patient also reports sneezing and increasing sinus headache.  The patient denies muscle aches and severe fatigue.  She has felt a little short of breath intermittently and has had some coughing - she has asthma and has a history of recurrent bronchitis.      Updated Prior Medication List: NEXIUM 40 MG CPDR (ESOMEPRAZOLE MAGNESIUM) Take 1 tablet by mouth once a day TRAZODONE HCL 50 MG TABS (TRAZODONE HCL) 1-3  tab by mouth at bedtime as needed SINGULAIR 10 MG TABS (MONTELUKAST SODIUM) Take 1 tablet by mouth once a day ALPRAZOLAM 0.25 MG TABS (ALPRAZOLAM) as needed SPIRONOLACTONE 25 MG TABS (SPIRONOLACTONE) as needed MELOXICAM 7.5 MG TABS (MELOXICAM) Take 1 tablet by mouth once a day FEXOFENADINE HCL 180 MG TABS (FEXOFENADINE HCL) one tab by mouth once daily as needed Current Allergies (reviewed today): ! PCN  Past Medical History:    PMS (ICD-625.4) - gyn uses alprazolam and spironolactone prn for treatment  GASTRIC POLYP (ICD-211.1)    ESOPHAGEAL STRICTURE (ICD-530.3)    HIATAL HERNIA (ICD-553.3)    DIVERTICULOSIS, COLON (ICD-562.10)    ASTHMA (ICD-493.90)    recurrent bronchitis    GERD (ICD-530.81)  - sees GI    Allergic rhinitis    recurrent sinusitis    Osteoarthritis - sees ortho - has had steroid shot in the knee     Review of Systems       see HPI   Physical Exam  General:     Well-developed, well nourished, well hydrated, in no acute distress, but tired/ill appearing, non-toxic,  appropriate and cooperative   Head:     Normocephalic and atraumatic without obvious abnormalities.  Eyes:     No corneal or conjunctival inflammation. EOMI. PERRLA. Funduscopic exam benign, Vision grossly normal. Ears:     External ear shows no significant lesions or deformities.  Otoscopic examination reveals clear canals, tympanic membranes normal bilaterally. Hearing is grossly normal. Nose:     External nasal examination shows no deformity or inflammation.. Nasal mucosa are pale, boggy and moist without lesions or exudates. plus sinus pain with palpation Mouth:     Oral mucosa and oropharynx without lesions, erythema or exudates, but PND present    Neck:     supple, full  ROM, no LAN Lungs:     Normal respiratory effort, chest expands symmetrically with good air flow noted. Lungs without rales or wheezes, but few scattered crackles.   Heart:     Normal rate and  rhythm. S1 and S2 normal, without gallop, murmur, click, rub  Abdomen:     Bowel sounds positive, abdomen soft and non-tender without masses, organomegaly or hernias noted. no distention  Extremities:     No clubbing, cyanosis, edema, or deformity noted, grossly normal full range of motion with all joints - except R knee with sl decrease.   Neurologic:     alert & oriented X3,  CN II-XII intact, gait normal.  no deficit in strength noted, no balance problems noted, neuro is grossly intact Skin:     Intact without suspicious  lesions or rashes Psych:     Cognition and judgment appear intact. Alert and cooperative with normal attention span and concentration. No apparent delusions, illusions, hallucinations, normally interactive, good eye contact, not anxious or agitated.       Impression & Recommendations:  Problem # 1:  SINUSITIS, ACUTE (ICD-461.9) will treat with levaquin.  Get plenty of rest, drink lots of clear liquids, and use Tylenol or Ibuprofen for fever and comfort. For sinus symptoms,  use warm moist compresses, and over the counter decongestants (only as directed). Call if no improvement in 5-7 days, sooner if increasing pain, fever, or new symptoms.  Her updated medication list for this problem includes:    Levaquin 750 Mg Tabs (Levofloxacin) .Marland Kitchen... 1 tab by mouth x 5 days   Complete Medication List: 1)  Nexium 40 Mg Cpdr (Esomeprazole magnesium) .... Take 1 tablet by mouth once a day 2)  Trazodone Hcl 50 Mg Tabs (Trazodone hcl) .Marland Kitchen.. 1-3  tab by mouth at bedtime as needed 3)  Singulair 10 Mg Tabs (Montelukast sodium) .... Take 1 tablet by mouth once a day 4)  Alprazolam 0.25 Mg Tabs (Alprazolam) .... As needed 5)  Spironolactone 25 Mg Tabs (Spironolactone) .... As needed 6)  Meloxicam 7.5 Mg Tabs (Meloxicam) .... Take 1 tablet by mouth once a day 7)  Fexofenadine Hcl 180 Mg Tabs (Fexofenadine hcl) .... One tab by mouth once daily as needed 8)  Levaquin 750 Mg Tabs (Levofloxacin) .Marland Kitchen.. 1 tab by mouth x 5 days    Prescriptions: LEVAQUIN 750 MG TABS (LEVOFLOXACIN) 1 tab by mouth x 5 days  #5 x 0   Entered and Authorized by:   Paulo Fruit MD   Signed by:   Paulo Fruit MD on 11/25/2008   Method used:   [facility] to        [facility]  [address] [phone]* (retail)       [address]       [place], [place]  [address]       Ph: [phone] or [phone]       Fax: [phone]   RxID:   [id number]  ]

## 2011-01-20 NOTE — Assessment & Plan Note (Signed)
Summary: SINUS INFECTION/HEA   Vital Signs:  Patient profile:   55 year old female Height:      65.5 inches Weight:      185.50 pounds BMI:     30.51 Temp:     98.1 degrees F oral Pulse rate:   102 / minute Pulse rhythm:   regular Resp:     16 per minute BP sitting:   118 / 74  (right arm) Cuff size:   regular  Vitals Entered By: Melissa Middleton CMA (May 03, 2010 10:15 AM) CC: room 5  Productive cough, head congestion x 1 week. Is Patient Diabetic? No   CC:  room 5  Productive cough and head congestion x 1 week.Marland Kitchen  History of Present Illness: Melissa Middleton is a 55 year old female who presents today with complaint of "sinus infection."  Notes 1 week history of nasal drainage which has turned yellow/green drips into chest, + cough.  Uses Singulair at night for asthma/allergies as well as Zyrtec.  Has been using nasal saline spray without improvement.  Denies fever.  Feels "bad".    Preventive Screening-Counseling & Management  Alcohol-Tobacco     Alcohol drinks/day: 0     Smoking Status: never  Caffeine-Diet-Exercise     Caffeine use/day: 3 cups daily     Does Patient Exercise: no  Allergies (verified): 1)  ! Pcn  Past History:  Past Medical History: Allergies Asthma Gerd Arthritis  Past Surgical History: Right Knee Replacement--12/10 Sinus surgery. (deviated septum)--1992, 1993 C-section--2002 Tubal Ligation & ablation--2003  Family History: Family History of Arthritis--father Family History of Asthma--parents Diabetes--father,paternal grandmother HTN--parents  Social History: Married Never Smoked Alcohol use-no Regular exercise-no Smoking Status:  never Caffeine use/day:  3 cups daily Does Patient Exercise:  no  Physical Exam  General:  Well-developed,well-nourished,in no acute distress; alert,appropriate and cooperative throughout examination Head:  Normocephalic and atraumatic without obvious abnormalities. No apparent alopecia or balding. Eyes:   PERRLA Ears:  External ear exam shows no significant lesions or deformities.  Otoscopic examination reveals clear canals, tympanic membranes are intact bilaterally without bulging, retraction, inflammation or discharge. Hearing is grossly normal bilaterally. Mouth:  Oral mucosa and oropharynx without lesions or exudates.  Teeth in good repair. Neck:  No deformities, masses, or tenderness noted. Lungs:  Normal respiratory effort, chest expands symmetrically. Lungs are clear to auscultation, no crackles or wheezes. Heart:  Normal rate and regular rhythm. S1 and S2 normal without gallop, murmur, click, rub or other extra sounds.   Impression & Recommendations:  Problem # 1:  SINUSITIS (ICD-473.9) Assessment New Notes + rash on palms with Penicillin.  Will treat with Ceftin.   Pt advised to follow up/call per pt instructions. Her updated medication list for this problem includes:    Ceftin 500 Mg Tabs (Cefuroxime axetil) ..... One tab by mouth two times a day x 10 days  Complete Medication List: 1)  Nexium 40 Mg Cpdr (Esomeprazole magnesium) .... Take 1 tablet by mouth once a day 2)  Singulair 10 Mg Tabs (Montelukast sodium) .... Take 1 tablet by mouth once a day 3)  Zyrtec Allergy 10 Mg Tbdp (Cetirizine hcl) .... Take 1 tablet by mouth once a day 4)  Trazodone Hcl 100 Mg Tabs (Trazodone hcl) .... 1/2 to 1 tablet at bedtime 5)  Alprazolam 0.25 Mg Tabs (Alprazolam) .... 1/2 as needed. 6)  Ceftin 500 Mg Tabs (Cefuroxime axetil) .... One tab by mouth two times a day x 10 days  Patient Instructions:  1)   Call if you develop fever over 101, increasing sinus pressure, pain with eye movement, increased facial tenderness of swelling, or if you develop visual changes. Prescriptions: CEFTIN 500 MG TABS (CEFUROXIME AXETIL) one tab by mouth two times a day x 10 days  #20 x 0   Entered and Authorized by:   Melissa Fillers FNP   Signed by:   Melissa Fillers FNP on 05/03/2010   Method used:    [facility]        [facility]  [address] [phone]* (retail)       [address]       [place], [place]  [address]       Ph: [phone] or [phone]       Fax: [phone]   RxID:   [id number]   Current Allergies (reviewed today): ! PCN

## 2011-01-20 NOTE — Assessment & Plan Note (Signed)
Summary: ? Sinus Infection- jr   Vital Signs:  Patient profile:   55 year old female Height:      65.5 inches Weight:      191 pounds BMI:     31.41 Temp:     97.9 degrees F oral Pulse rate:   88 / minute Pulse rhythm:   regular Resp:     16 per minute BP sitting:   122 / 70  (right arm) Cuff size:   large  Vitals Entered By: Glendell Docker CMA (April 16, 2009 2:22 PM)  Primary Care Provider:  Paulo Fruit MD  CC:  sinus Infection.  History of Present Illness: c/o head congestion , sinus congestion , low grade fever for past week and half , taken zyrtec, with no relief.  She has hx of chronic allergic rhinitis.  She has seen allergist in the past.  She is allergic to tree pollen.  Allergies: 1)  ! Pcn  Past History:  Past Medical History:    PMS (ICD-625.4) - gyn uses alprazolam and spironolactone prn for treatment    GASTRIC POLYP (ICD-211.1)    ESOPHAGEAL STRICTURE (ICD-530.3)    HIATAL HERNIA (ICD-553.3)    DIVERTICULOSIS, COLON (ICD-562.10)    ASTHMA (ICD-493.90)     recurrent bronchitis    GERD (ICD-530.81)  - sees GI    Allergic rhinitis    recurrent sinusitis    Osteoarthritis - sees ortho - has had steroid shot in the knee  Past Surgical History:    tubal ligation and ablation 2004    sinus surgery   Social History:    Occupation: unemployed Environmental health practitioner    Married    2 children   Physical Exam  General:  alert, well-developed, and well-nourished.   Head:  normocephalic and atraumatic.   Eyes:  vision grossly intact, pupils equal, pupils round, and pupils reactive to light.   Ears:  R ear normal and L ear normal.   Nose:  nasal dischargemucosal pallor and mucosal edema.   Mouth:  postnasal drip.   Neck:  supple and no masses.   Lungs:  normal respiratory effort and normal breath sounds.   Heart:  normal rate, regular rhythm, and no gallop.     Impression & Recommendations:  Problem # 1:  RHINOSINUSITIS, ACUTE (ICD-461.8) Pt  has hx of allergic rhinitis.  She c/o nasal congestion and post nasal gtt.   Low grade fever.   No tooth ache or unilateral symptoms.   I suggested pt try nasal saline irrigation and xyzal for 3-5 days.   Red flag symptoms reviewed for bacterial sinusitis.  Rx for ceftin provided.  Her updated medication list for this problem includes:    Cefuroxime Axetil 500 Mg Tabs (Cefuroxime axetil) ..... One by mouth bid  Complete Medication List: 1)  Nexium 40 Mg Cpdr (Esomeprazole magnesium) .... Take 1 cap by mouth once a day 2)  Trazodone Hcl 100 Mg Tabs (Trazodone hcl) .... One tab by mouth at bedtime 3)  Singulair 10 Mg Tabs (Montelukast sodium) .... Take 1 tablet by mouth once a day (office visit required for new rx) 4)  Alprazolam 0.25 Mg Tabs (Alprazolam) .... As needed 5)  Spironolactone 25 Mg Tabs (Spironolactone) .... As needed 6)  Meloxicam 7.5 Mg Tabs (Meloxicam) .... Take 1 tablet by mouth once a day 7)  Xyzal 5 Mg Tabs (Levocetirizine dihydrochloride) .... One by mouth qd 8)  Cefuroxime Axetil 500 Mg Tabs (Cefuroxime axetil) .... One by mouth  bid  Patient Instructions: 1)  Use netti pot every 3 days. 2)  2 cups of warm water, 1/2 tsp sea salt, 1/2 tsp of baking soda. 3)  Call our office if your symptoms do not  improve or gets worse. Prescriptions: XYZAL 5 MG TABS (LEVOCETIRIZINE DIHYDROCHLORIDE) one by mouth qd  #90 x 1   Entered and Authorized by:   D. Thomos Lemons DO   Signed by:   D. Thomos Lemons DO on 04/16/2009   Method used:   [facility] to        [facility]  [address] [phone]* (retail)       [address]       [place], [place]  [address]       Ph: [phone] or [phone]       Fax: [phone]   RxID:   [id number] XYZAL 5 MG TABS (LEVOCETIRIZINE DIHYDROCHLORIDE) one by mouth qd  #30 x 5   Entered and Authorized by:   D. Thomos Lemons DO   Signed by:   D. Thomos Lemons DO on 04/16/2009   Method used:   [facility]ronically to        [facility]  [address] [phone]*  (retail)       [address]       [place], [place]  [address]       Ph: [phone] or [phone]       Fax: [phone]   RxID:   [id number] CEFUROXIME AXETIL 500 MG TABS (CEFUROXIME AXETIL) one by mouth bid  #20 x 0   Entered and Authorized by:   D. Thomos Lemons DO   Signed by:   D. Thomos Lemons DO on 04/16/2009   Method used:   Print then Give to Patient   RxID:   [id number]     Current Allergies (reviewed today): ! PCN

## 2011-01-20 NOTE — Assessment & Plan Note (Signed)
Summary: NS/NO PERFUMES-JR   Vital Signs:  Patient Profile:   55 Years Old Female Height:     65.5 inches Weight:      192 pounds BMI:     31.58 O2 treatment:    Room Air Temp:     98.1 degrees F oral Pulse rate:   88 / minute Pulse rhythm:   regular Resp:     20 per minute BP sitting:   140 / 70  (right arm) Cuff size:   regular  Vitals Entered By: Darra Lis RMA (September 19, 2008 11:18 AM)  Menstrual History: LMP - Character: ablation Menarche: 11 Menses interval: 28 days Menstrual flow: 5 days             Is Patient Diabetic? No     Visit Type:  new patient  PCP:  Paulo Fruit MD  Chief Complaint:  Establish care with new doctor.Marland Kitchen  History of Present Illness: Patient is here to establish care.  Patient c/o vaginal itching after taking an Abx.   Similar to previous yeast infections.  Would like diflucan.  Also, patient dealing with some insomnia and possible depression since loosing her job 2 years ago.  She has been on trazodone 50mg  for a few years for sleep, but nothing for depression.  She uses xanax as needed for PMS.  Depression Symptoms:  sleep disturbance, appetite disturbance, weight gain, low energy, decreased productivity / effectiveness, some feeling of inadequacy, decreased sense of enjoyment,  less active, crying spells, reduced motivation.   No suicidal/homicidal thoughts/plans.  No other concerns today.  She sees her gyn regularly and is UTD.     Updated Prior Medication List: NEXIUM 40 MG CPDR (ESOMEPRAZOLE MAGNESIUM) Take 1 tablet by mouth once a day TRAZODONE HCL 50 MG TABS (TRAZODONE HCL) one tab by mouth at bedtime as needed SINGULAIR 10 MG TABS (MONTELUKAST SODIUM) Take 1 tablet by mouth once a day ALPRAZOLAM 0.25 MG TABS (ALPRAZOLAM) as needed  Current Allergies (reviewed today): ! PCN  Past Medical History:    Current Problems:     PMS (ICD-625.4) - gyn uses alprazolam for treatment    GASTRIC POLYP (ICD-211.1)  ESOPHAGEAL STRICTURE (ICD-530.3)    HIATAL HERNIA (ICD-553.3)    DIVERTICULOSIS, COLON (ICD-562.10)    ASTHMA (ICD-493.90)    GERD (ICD-530.81)  - sees GI    Allergic rhinitis    Osteoarthritis - sees ortho - has had steroid shot in the knee  Past Surgical History:    Reviewed history from 01/04/2008 and no changes required:       tubal ligation and ablation 2004       sinus surgery   Family History:    Family History of Arthritis    Family History Diabetes 1st degree relative  Social History:    Occupation: unemployed Environmental health practitioner    Married    2 children   Risk Factors:  Tobacco use:  never Passive smoke exposure:  no Drug use:  no HIV high-risk behavior:  no Caffeine use:  1 drinks per day Alcohol use:  no Exercise:  no Seatbelt use:  100 % Sun Exposure:  remote  Family History Risk Factors:    Family History of MI in females < 55 years old:  no    Family History of MI in males < 38 years old:  no  Mammogram History:     Date of Last Mammogram:  04/17/2008    Results:  Normal Bilateral   PAP  Smear History:     Date of Last PAP Smear:  04/17/2008    Results:  Normal   Colonoscopy History:     Date of Last Colonoscopy:  12/19/2007    Results:  Diverticulosis    Review of Systems       The patient denies anorexia, fever, weight loss, weight gain, vision loss, decreased hearing, hoarseness, chest pain, syncope, dyspnea on exertion, peripheral edema, prolonged cough, headaches, hemoptysis, abdominal pain, melena, hematochezia, severe indigestion/heartburn, hematuria, incontinence, genital sores, muscle weakness, transient blindness, difficulty walking, depression, unusual weight change, abnormal bleeding, enlarged lymph nodes, angioedema, and breast masses.     Physical Exam  General:     Well-developed, well nourished, well hydrated, in no acute distress; appropriate and cooperative   Head:     Normocephalic and atraumatic without obvious  abnormalities.  Eyes:     No corneal or conjunctival inflammation. EOMI. PERRLA. Funduscopic exam benign, Vision grossly normal. Ears:     External ear shows no significant lesions or deformities.  Otoscopic examination reveals clear canals, tympanic membranes normal bilaterally. Hearing is grossly normal. Nose:     External nasal examination shows no deformity or inflammation.  Mouth:     Oral mucosa and oropharynx without lesions, erythema or exudates.  Teeth in good repair. no postnasal drip and no tongue abnormalities.   Neck:     supple, full ROM, no masses, and no thyromegaly.   Chest Wall:     No deformities, masses, or tenderness noted. Lungs:     Normal respiratory effort, chest expands symmetrically with good air flow noted. Lungs clear to auscultation with no crackles, rales or wheezes.   Heart:     Normal rate and  rhythm. S1 and S2 normal, without gallop, murmur, click, rub  Abdomen:     Bowel sounds positive, abdomen soft and non-tender without masses, organomegaly or hernias noted. no distention  Msk:     No gross deformity noted of cervical, thoracic or lumbar spine. normal ROM. no muscle atrophy noted.   Extremities:     No clubbing, cyanosis, edema, or deformity noted, grossly normal full range of motion with all joints.   Neurologic:     alert & oriented X3,  gait normal.  no deficit in strength noted, no balance problems noted, neuro is grossly intact Skin:     Intact without suspicious lesions or rashes Psych:     Cognition and judgment appear intact. Alert and cooperative with normal attention span and concentration. No apparent delusions, illusions, hallucinations, normally interactive, good eye contact, not anxious or agitated.  She is tearful when talking about loss of job, but appropriate to situation     Impression & Recommendations:  Problem # 1:  CANDIDIASIS, VAGINAL (ICD-112.1) Will treat with diflucan.  If symptoms increase / change / persist, she  will follow up with gyn Her updated medication list for this problem includes:    Fluconazole 150 Mg Tabs (Fluconazole) ..... One tab by mouth now  Greater than 45 minutes spent with patient today  Problem # 2:  INSOMNIA (ICD-780.52) Long d/w patient and Insomnia and possible mild depression.  patient will try trazodone 50mg  1-3 tabs by mouth at bedtime and hold off on using the xanax.  she will f/u with me in one month.  No suicidal / homicidal thoughts / plans.  Safety contract made.   Will do full PE and labs at f/u appt  Complete Medication List: 1)  Nexium 40 Mg Cpdr (  Esomeprazole magnesium) .... Take 1 tablet by mouth once a day 2)  Trazodone Hcl 50 Mg Tabs (Trazodone hcl) .Marland Kitchen.. 1-3  tab by mouth at bedtime as needed 3)  Singulair 10 Mg Tabs (Montelukast sodium) .... Take 1 tablet by mouth once a day 4)  Alprazolam 0.25 Mg Tabs (Alprazolam) .... As needed 5)  Fluconazole 150 Mg Tabs (Fluconazole) .... One tab by mouth now  Other Orders: Influenza Vaccine NON MCR ([address])   Patient Instructions: 1)  Please schedule a follow-up appointment in 1 month with MD fasting / early morning - not just a lab appt   Prescriptions: FLUCONAZOLE 150 MG TABS (FLUCONAZOLE) one tab by mouth now  #1 x 0   Entered and Authorized by:   Paulo Fruit MD   Signed by:   Paulo Fruit MD on 09/19/2008   Method used:   Electronically to        [facility]  [address] [phone]* (retail)       [address]       [place], [place]  [address]       Ph: [phone] or [phone]       Fax: [phone]   RxID:   [id number] TRAZODONE HCL 50 MG TABS (TRAZODONE HCL) 1-3  tab by mouth at bedtime as needed  #90 x 1   Entered and Authorized by:   Paulo Fruit MD   Signed by:   Paulo Fruit MD on 09/19/2008   Method used:   [facility]ron[facility] to        [facility]  [address] [phone]* (retail)       [address]       [place], [place]  [address]       Ph:  [phone] or [phone]       Fax: [phone]   RxID:   [id number]  ]  Influenza Vaccine    Vaccine Type: Fluvax Non-MCR    Site: left deltoid    Mfr: GlaxoSmithKline    Dose: 0.5 ml    Route: IM    Given by: Darra Lis RMA    Exp. Date: 06/17/2009    Lot #: [id number]  Flu Vaccine Consent Questions    Do you have a history of severe allergic reactions to this vaccine? no    Any prior history of allergic reactions to egg and/or gelatin? no    Do you have a sensitivity to the preservative Thimersol? no    Do you have a past history of Guillan-Barre Syndrome? no    Do you currently have an acute febrile illness? no    Have you ever had a severe reaction to latex? no    Vaccine information given and explained to patient? yes    Are you currently pregnant? no

## 2011-01-20 NOTE — Assessment & Plan Note (Signed)
Summary: bronchitis /hea--rm 6   Vital Signs:  Patient profile:   55 year old female Height:      65.5 inches Weight:      189.50 pounds BMI:     31.17 O2 Sat:      95 % on Room air Temp:     98.3 degrees F oral Pulse rate:   84 / minute Pulse rhythm:   regular Resp:     16 per minute BP sitting:   120 / 82  (right arm) Cuff size:   large  Vitals Entered By: Mervin Kung CMA Duncan Dull) (September 17, 2010 3:41 PM)  O2 Flow:  Room air CC: Rm 6  Pt states she completed antibiotic for sinus infection but feels like she now has bronchitis. Productive yellow cough. Is Patient Diabetic? No Comments Pt has completed Ceftin. Nicki Guadalajara Fergerson CMA Duncan Dull)  September 17, 2010 3:48 PM    CC:  Rm 6  Pt states she completed antibiotic for sinus infection but feels like she now has bronchitis. Productive yellow cough..  History of Present Illness: Melissa Middleton is a 55 year old female who was seen 9/12 for sinusitus.  Complicted Z-pack- then the symptoms started ot move into her chest.  Has been off abx, still feels draggy".  She took clarithromycin last night and again this morning- now feeling better. Has noted + wheezing.  Had cough/SOB, denies fever.  Nasal drainage has resolved.  Mild post-nasal drip.  Allergies: 1)  ! Pcn  Past History:  Past Medical History: Last updated: 05/03/2010 Allergies Asthma Gerd Arthritis  Past Surgical History: Last updated: 05/03/2010 Right Knee Replacement--12/10 Sinus surgery. (deviated septum)--1992, 1993 C-section--2002 Tubal Ligation & ablation--2003  Review of Systems       see HPI  Physical Exam  General:  Well-developed,well-nourished,in no acute distress; alert,appropriate and cooperative throughout examination Head:  Normocephalic and atraumatic without obvious abnormalities. No apparent alopecia or balding. Lungs:  Normal respiratory effort, chest expands symmetrically. Lungs are clear to auscultation, no crackles or wheezes. Heart:   Normal rate and regular rhythm. S1 and S2 normal without gallop, murmur, click, rub or other extra sounds.   Impression & Recommendations:  Problem # 1:  ACUTE BRONCHITIS (ICD-466.0) Assessment New Lungs are clear.  She is responding well to clarithromycin.  Will continue same.  Will also add albuterol as needed for wheezing.   The following medications were removed from the medication list:    Ceftin 500 Mg Tabs (Cefuroxime axetil) ..... One tab by mouth two times a day x 10 days Her updated medication list for this problem includes:    Singulair 10 Mg Tabs (Montelukast sodium) .Marland Kitchen... Take 1 tablet by mouth once a day    Clarithromycin 500 Mg Tabs (Clarithromycin) ..... One tablet by mouth two times a day x 7 days    Ventolin Hfa 108 (90 Base) Mcg/act Aers (Albuterol sulfate) .Marland Kitchen... 2 puffs every 6 hours as neede for wheezing  Complete Medication List: 1)  Nexium 40 Mg Cpdr (Esomeprazole magnesium) .... Take 1 tablet by mouth once a day 2)  Singulair 10 Mg Tabs (Montelukast sodium) .... Take 1 tablet by mouth once a day 3)  Zyrtec Allergy 10 Mg Tbdp (Cetirizine hcl) .... Take 1 tablet by mouth once a day 4)  Trazodone Hcl 100 Mg Tabs (Trazodone hcl) .... 1/2 to 1 tablet at bedtime 5)  Alprazolam 0.25 Mg Tabs (Alprazolam) .... 1/2 as needed. 6)  Clarithromycin 500 Mg Tabs (Clarithromycin) .... One tablet  by mouth two times a day x 7 days 7)  Ventolin Hfa 108 (90 Base) Mcg/act Aers (Albuterol sulfate) .... 2 puffs every 6 hours as neede for wheezing  Patient Instructions: 1)  Please call if symptoms worsen, if you develop fever, or if your symptoms are not resolved by the time you finish the antibiotics.  Prescriptions: CLARITHROMYCIN 500 MG TABS (CLARITHROMYCIN) one tablet by mouth two times a day x 7 days  #14 x 0   Entered and Authorized by:   Lemont Fillers FNP   Signed by:   Lemont Fillers FNP on 09/17/2010   Method used:   [facility] to        [facility]  [address]  [phone]* (retail)       [address]       [place], [place]  [address]       Ph: [phone] or [phone]       Fax: [phone]   RxID:   [id number]

## 2011-01-20 NOTE — Assessment & Plan Note (Signed)
Summary: CLEARANCE FOR SURGERY/MHF   Vital Signs:  Patient profile:   55 year old female Weight:      189.25 pounds BMI:     31.13 O2 Sat:      100 % on Room air Temp:     98.6 degrees F oral Pulse rate:   82 / minute Pulse rhythm:   regular Resp:     16 per minute BP sitting:   110 / 80  (right arm) Cuff size:   large  Vitals Entered By: Glendell Docker CMA (November 26, 2009 11:19 AM)  O2 Flow:  Room air  Primary Care Provider:  D. Thomos Lemons DO  CC:  Surgical Clearance and Pre-op Evaluation.  History of Present Illness:  Pre-Op Evaluation      I was asked to see this delightful patient today for Pre-op Evaluation. She is scheduled for right knee total knee replacement by Dr Louis Matte with [facility] on 12/07/2009.  The patient denies respiratory symptoms, chest pain, heavy ETOH use, and smoking.  Patient has no history of acute or recent MI, unstable or severe angina, and decompensated CHF.  There is no history of antiplatelet agents and diabetes meds.  No unexpected reactions to anesthesia with previous surgeries.  Preventive Screening-Counseling & Management  Alcohol-Tobacco     Alcohol drinks/day: 0     Smoking Status: never     Tobacco Counseling: not indicated; no tobacco use  Caffeine-Diet-Exercise     Caffeine use/day: 1 beveragea daily     Caffeine Counseling: decrease use of caffeine     Does Patient Exercise: no  Allergies: 1)  ! Pcn 2)  Cefuroxime Axetil (Cefuroxime Axetil)  Past History:  Past Medical History: PMS (ICD-625.4) - gyn uses alprazolam and spironolactone prn for treatment GASTRIC POLYP (ICD-211.1) ESOPHAGEAL STRICTURE (ICD-530.3)  HIATAL HERNIA (ICD-553.3) DIVERTICULOSIS, COLON (ICD-562.10) ASTHMA (ICD-493.90)  recurrent bronchitis   GERD (ICD-530.81)  - sees GI Allergic rhinitis recurrent sinusitis Osteoarthritis - sees ortho - has had steroid shot in the knee Post menopausal symptoms  Past Surgical  History: tubal ligation and ablation 2004 sinus surgery       Family History: Family History of Arthritis Family History Diabetes 1st degree relative       Social History: 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   Neck:  supple and no masses.   Lungs:  normal respiratory effort and normal breath sounds.   Heart:  normal rate, regular rhythm, no murmur, and no gallop.   Abdomen:  soft, non-tender, and normal bowel sounds.   Extremities:  No lower extremity edema  Neurologic:  cranial nerves II-XII intact and gait normal.   Psych:  normally interactive, good eye contact, not anxious appearing, and not depressed appearing.     Impression & Recommendations:  Problem # 1:  PREOPERATIVE EXAMINATION (ICD-V72.84) Low cardiac risk.  I would not pursue any additional pre op cardiac testing.   Her respiratory status is stable.   Pt advised to DC HRT due to increased risk of DVT.    Complete Medication List: 1)  Nexium 40 Mg Cpdr (Esomeprazole magnesium) .... Take 1 cap by mouth once a day 2)  Trazodone Hcl 100 Mg Tabs (Trazodone hcl) .... Take 1/2 to one  tab by mouth at bedtime as needed 3)  Singulair 10 Mg Tabs (Montelukast sodium) .... Take 1 tablet  by mouth once a day 4)  Alprazolam 0.25 Mg Tabs (Alprazolam) .... As needed 5)  Spironolactone 25 Mg Tabs (Spironolactone) .... Take 1 tablet by mouth once a day as needed 6)  Meloxicam 7.5 Mg Tabs (Meloxicam) .... Take 1 tablet by mouth once a day 7)  Zyrtec Allergy 10 Mg Caps (Cetirizine hcl) .... Take 1 capsule by mouth once a day as needed  Patient Instructions: 1)  Stop HRT (Hormone replacement therapy).   Current Allergies (reviewed today): ! PCN CEFUROXIME AXETIL (CEFUROXIME AXETIL)

## 2011-01-20 NOTE — Assessment & Plan Note (Signed)
Summary: congestion cough/mhf   Vital Signs:  Patient profile:   55 year old female Weight:      188 pounds BMI:     30.92 O2 Sat:      97 % on Room air Temp:     98.1 degrees F oral Pulse rate:   87 / minute Pulse rhythm:   regular Resp:     18 per minute BP sitting:   108 / 72  (right arm) Cuff size:   large  Vitals Entered By: Glendell Docker CMA (August 30, 2010 1:33 PM)  O2 Flow:  Room air CC: Head Congestion Is Patient Diabetic? No Pain Assessment Patient in pain? no        Primary Care Provider:  Dondra Spry DO  CC:  Head Congestion.  History of Present Illness: 55 y/o white female c/ o nasal drainage, headache , sluggish, productive cough green in color, denies temp-have not checked. ongoing for the past 10 days, taken Zyrtec, took rx for  clarithomycin she had at home.   she is feeling somewhat better today kids are also sick with URI  Preventive Screening-Counseling & Management  Alcohol-Tobacco     Smoking Status: never  Allergies: 1)  ! Pcn 2)  Cefuroxime Axetil (Cefuroxime Axetil)  Past History:  Past Medical History: PMS (ICD-625.4) - gyn uses alprazolam and spironolactone prn for treatment GASTRIC POLYP (ICD-211.1) ESOPHAGEAL STRICTURE (ICD-530.3)  HIATAL HERNIA (ICD-553.3)   DIVERTICULOSIS, COLON (ICD-562.10) ASTHMA (ICD-493.90)  recurrent bronchitis   GERD (ICD-530.81)  - sees GI   Allergic rhinitis recurrent sinusitis Osteoarthritis - sees ortho - has had steroid shot in the knee Post menopausal symptoms  Past Surgical History: tubal ligation and ablation 2004 sinus surgery        Right knee replacement 11/2009    Family History: Family History of Arthritis Family History Diabetes 1st degree relative         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   Ears:  R ear normal and L ear normal.   Nose:   mucosal erythema and mucosal edema.   Mouth:  postnasal drip.   Lungs:  normal respiratory effort and normal breath sounds.   Heart:  normal rate, regular rhythm, and no gallop.     Impression & Recommendations:  Problem # 1:  RHINOSINUSITIS, ACUTE (ICD-461.8) Probable viral and or allergic.  Pt advised to first try nasal saline irrigation and flonase.   we discussed signs of bacterial sinusitis and appropriate use of abx.   The following medications were removed from the medication list:    Ciprofloxacin Hcl 500 Mg Tabs (Ciprofloxacin hcl) ..... One by mouth two times a day Her updated medication list for this problem includes:    Fluticasone Propionate 50 Mcg/act Susp (Fluticasone propionate) .Marland Kitchen... 2 sprays each nostril qd    Azithromycin 250 Mg Tabs (Azithromycin) .Marland Kitchen... 2 tabs on day one, then one by mouth once daily x 4 days  Instructed on treatment. Call if symptoms persist or worsen.   Complete Medication List: 1)  Nexium 40 Mg Cpdr (Esomeprazole magnesium) .... Take 1 cap by mouth once a day 2)  Trazodone Hcl 100 Mg Tabs (Trazodone hcl) .... Take 1/2 to one  tab by mouth at bedtime as needed 3)  Singulair 10 Mg Tabs (Montelukast sodium) .... Take 1 tablet by mouth once a day 4)  Alprazolam  0.25 Mg Tabs (Alprazolam) .... As needed 5)  Spironolactone 25 Mg Tabs (Spironolactone) .... Take 1 tablet by mouth once a day as needed 6)  Meloxicam 7.5 Mg Tabs (Meloxicam) .... Take 1 tablet by mouth once a day 7)  Zyrtec Allergy 10 Mg Caps (Cetirizine hcl) .... Take 1 capsule by mouth once a day as needed 8)  Estrace 0.1 Mg/gm Crea (Estradiol) .Marland Kitchen.. 1g pv 2-3 x per week 9)  Clotrimazole-betamethasone 1-0.05 % Crea (Clotrimazole-betamethasone) .... Apply two times a day as directed 10)  Fluticasone Propionate 50 Mcg/act Susp (Fluticasone propionate) .... 2 sprays each nostril qd 11)  Azithromycin 250 Mg Tabs (Azithromycin) .... 2 tabs on day one, then one by mouth once daily x 4  days  Patient Instructions: 1)  Patient advised to call office if symptoms persist or worsen. Prescriptions: AZITHROMYCIN 250 MG TABS (AZITHROMYCIN) 2 tabs on day one, then one by mouth once daily x 4 days  #6 x 0   Entered and Authorized by:   D. Thomos Lemons DO   Signed by:   D. Thomos Lemons DO on 08/30/2010   Method used:   Print then Give to Patient   RxID:   [id number] CLOTRIMAZOLE-BETAMETHASONE 1-0.05 % CREA (CLOTRIMAZOLE-BETAMETHASONE) apply two times a day as directed  #30 grams x 1   Entered and Authorized by:   D. Thomos Lemons DO   Signed by:   D. Thomos Lemons DO on 08/30/2010   Method used:   [facility]        [facility]  [address] [phone][facility] (retail)       [address]       [place], [place]  [address]       Ph: [phone] or [phone]       Fax: [phone]   RxID:   [id number] FLUTICASONE PROPIONATE 50 MCG/ACT SUSP (FLUTICASONE PROPIONATE) 2 sprays each nostril qd  #1 x 2   Entered and Authorized by:   D. Thomos Lemons DO   Signed by:   D. Thomos Lemons DO on 08/30/2010   Method used:   [facility]        [facility]  [address] [phone]* (retail)       [address]       [place], [place]  [address]       Ph: [phone] or [phone]       Fax: [phone]   RxID:   [id number]   Current Allergies (reviewed today): ! PCN CEFUROXIME AXETIL (CEFUROXIME AXETIL)

## 2011-01-20 NOTE — Progress Notes (Signed)
Summary: EOB -Diagnoses  Change Request  Phone Note Call from Patient Call back at Home Phone [phone]   Caller: Mom Summary of Call: patient called and left voice message questioning her diagnoses for her last office visit. Her message states she was coded for persistent insomnia and she is stating she was just being seen for a medication refill. She is requesting a change in the diagnosis,because her insurance is making her responsible for payment. Initial call taken by: Glendell Docker CMA,  August 27, 2009 1:20 PM  Follow-up for Phone Call        Medication refill was for trazadone which is for chronic insomnia.  I can not change diagnosis Follow-up by: D. Thomos Lemons DO,  August 27, 2009 1:33 PM  Additional Follow-up for Phone Call Additional follow up Details #1::        Pt notified as directed   Additional Follow-up by: Darral Dash,  August 28, 2009 10:08 AM

## 2011-01-20 NOTE — Assessment & Plan Note (Signed)
Summary: Follow up /DK   Vital Signs:  Patient profile:   55 year old female Weight:      187.50 pounds BMI:     30.84 Temp:     98 degrees F oral Pulse rate:   76 / minute Pulse rhythm:   regular Resp:     16 per minute BP sitting:   104 / 78  (right arm) Cuff size:   large  Vitals Entered By: Glendell Docker CMA (July 23, 2009 2:27 PM)  Primary Care Provider:  Paulo Fruit MD  CC:  Follow up medication refill.  History of Present Illness: Follow up medication refill  55 year old white female for followup regarding chronic insomnia. Patient has been using trazodone. She denies adverse effect. She has difficulty maintaining sleep.  There is possible history of anxiety disorder.  She was previously on SSRI.  She was recently placed started on hormone replacement by her GYN. She feels her sleep has improved since starting hormone placement.  Allergies: 1)  ! 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   Lungs:  normal respiratory effort, normal breath sounds, and no wheezes.   Heart:  normal rate, regular rhythm, and no gallop.   Neurologic:  cranial nerves II-XII intact and gait normal.   Psych:  normally interactive, good eye contact, and not depressed appearing.       Impression & Recommendations:  Problem # 1:  INSOMNIA, CHRONIC (ICD-307.42) Continue trazadone for now.  Pt feels sleep is better since starting HRT.  She may have underlying anxiety disorder.   Her GYN tried SSRI but she did not continue.  Discuss further at next OV.  Complete Medication List: 1)  Nexium 40 Mg Cpdr (Esomeprazole magnesium) .... Take 1 cap by mouth once a day 2)  Trazodone Hcl 100 Mg Tabs (Trazodone hcl) .... Take 1/2 to one  tab by mouth at bedtime as needed 3)  Singulair 10 Mg Tabs (Montelukast sodium) .... Take 1 tablet by mouth once a day 4)  Alprazolam 0.25 Mg Tabs (Alprazolam) .... As needed 5)  Spironolactone 25 Mg Tabs (Spironolactone) .... Take 1 tablet by mouth once a day as needed 6)  Meloxicam 7.5 Mg Tabs (Meloxicam) .... Take 1 tablet by mouth once a day 7)  Zyrtec Allergy 10 Mg Caps (Cetirizine hcl) .... Take 1 capsule by mouth once a day as needed 8)  Angeliq 0.5-1 Mg Tabs (Drospirenone-estradiol) .... Take 1 tablet by mouth once a day  Other Orders: Tdap => 22yrs IM (30865) Admin 1st Vaccine (78469)  Patient Instructions: 1)  Please schedule a follow-up appointment in 6 months. Prescriptions: TRAZODONE HCL 100 MG TABS (TRAZODONE HCL) Take 1/2 to one  tab by mouth at bedtime as needed  #90 x 1   Entered and Authorized by:   D. Thomos Lemons DO   Signed by:   D. Thomos Lemons DO on 07/23/2009  Method used:   Electronically to        [facility]  [address] [phone]* (retail)       [address]       [place], [place]  [address]       Ph: [phone] or [phone]       Fax: [phone]   RxID:   [id number] SINGULAIR 10 MG TABS (MONTELUKAST SODIUM) Take 1 tablet by mouth once a day  #90 x 1   Entered and Authorized by:   D. Thomos Lemons DO   Signed by:   D. Thomos Lemons DO on 07/23/2009   Method used:   Electronically to        [facility]  [address] [phone]* (retail)       [address]       [place], [place]   [address]       Ph: [phone] or [phone]       Fax: [phone]   RxID:   [id number] NEXIUM 40 MG CPDR (ESOMEPRAZOLE MAGNESIUM) Take 1 cap by mouth once a day  #90 x 1   Entered and Authorized by:   D. Thomos Lemons DO   Signed by:   D. Thomos Lemons DO on 07/23/2009   Method used:   [facility]ically [facility]        [facility]  [address] [phone]* (retail)       [address]       [place], [place]  [address]       Ph: [phone] or [phone]       Fax: [phone]   RxID:   [id number]   Current Allergies (reviewed today): ! PCN   Preventive Care Screening  Pap Smear:    Date:  07/21/2009    Results:  Done   Mammogram:    Date:  07/17/2009    Results:  normal     Immunizations Administered:  Tetanus Vaccine:    Vaccine Type: Tdap    Site: left deltoid    Mfr: GlaxoSmithKline    Dose: 0.5 ml    Route: IM    Given by: Glendell Docker CMA    Exp. Date: 10/22/2011    Lot #: [id number]    VIS given: 11/06/07 version given July 23, 2009.

## 2011-01-20 NOTE — Progress Notes (Signed)
Summary: Singular Refill  Phone Note Refill Request Message from:  Fax from Pharmacy on April 10, 2009 4:33 PM  Refills Requested: Medication #1:  SINGULAIR 10 MG TABS Take 1 tablet by mouth once a day   Dosage confirmed as above?Dosage Confirmed   Brand Name Necessary? No   Supply Requested: 3 months   Last Refilled: 01/03/2009  Method Requested: Electronic Next Appointment Scheduled: NO FUTURE APPOINTMENTS  Initial call taken by: Glendell Docker CMA,  April 10, 2009 4:34 PM  Follow-up for Phone Call        refill x 3.   needs ov for further refills Follow-up by: D. Thomos Lemons DO,  April 10, 2009 4:41 PM    New/Updated Medications: SINGULAIR 10 MG TABS (MONTELUKAST SODIUM) Take 1 tablet by mouth once a day (OFFICE VISIT REQUIRED FOR NEW RX)   Prescriptions: SINGULAIR 10 MG TABS (MONTELUKAST SODIUM) Take 1 tablet by mouth once a day (OFFICE VISIT REQUIRED FOR NEW RX)  #90 x 0   Entered by:   Glendell Docker CMA   Authorized by:   D. Thomos Lemons DO   Signed by:   Glendell Docker CMA on 04/10/2009   Method used:   [facility]        [facility]  [address] [phone]* (retail)       [address]       [place], [place]  [address]       Ph: [phone] or [phone]       Fax: [phone]   RxID:   [id number]

## 2011-01-20 NOTE — Assessment & Plan Note (Signed)
Summary: sinus infection/mhf   Vital Signs:  Patient profile:   55 year old female Weight:      187.25 pounds BMI:     30.80 Temp:     97.5 degrees F oral Pulse rate:   80 / minute Pulse rhythm:   regular Resp:     16 per minute BP sitting:   120 / 80  (right arm) Cuff size:   large  Vitals Entered By: Glendell Docker CMA (September 25, 2009 2:08 PM)  Primary Care Provider:  Dondra Spry DO  CC:  Sinus Pain.  History of Present Illness: c/o headaches and sinus drainage, sluggish for the past week, took motrin with some relief , occipital and frontal pain  for the past week.  post nasal gtt.  light greenish discoloration to mucus  Allergies: 1)  ! 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   Head:  normocephalic and atraumatic.   Ears:  R ear normal and L ear normal.   Mouth:  pharynx pink and moist.   Neck:  supple and no masses.   Lungs:  normal respiratory effort and normal breath sounds.   Heart:  normal rate, regular rhythm, and no gallop.     Impression & Recommendations:  Problem # 1:  RHINOSINUSITIS, ACUTE (ICD-461.8)  We discussed pros and cons of abx therapy.  I suggest early or mild sinus disease.  Pt will try nasal saline irrigation.   If fever and / or  purulent nasal drainage - use ceftin as directed.   Patient advised to call office if symptoms persist or worsen.  Her updated medication list for this problem includes:    Cefuroxime Axetil 500 Mg Tabs (Cefuroxime axetil) ..... One by mouth two times a day  Complete Medication List: 1)  Nexium 40 Mg Cpdr (Esomeprazole magnesium) .... Take 1 cap by mouth once a day 2)  Trazodone Hcl 100 Mg Tabs (Trazodone hcl) .... Take 1/2 to one  tab by mouth at bedtime as needed 3)  Singulair 10 Mg Tabs (Montelukast sodium) .... Take 1 tablet by mouth once a day 4)  Alprazolam 0.25 Mg Tabs (Alprazolam) .... As needed 5)  Spironolactone 25 Mg Tabs (Spironolactone) .... Take 1 tablet by mouth once a day as needed 6)  Meloxicam 7.5 Mg Tabs (Meloxicam) .... Take 1 tablet by mouth once a day 7)  Zyrtec Allergy 10 Mg Caps (Cetirizine hcl) .... Take 1 capsule by mouth once a day as needed 8)  Activella 1-0.5 Mg Tabs (Estradiol-norethindrone acet) .... Take 1 tablet by mouth once a day 9)  Cefuroxime Axetil 500 Mg Tabs (Cefuroxime axetil) .... One by mouth two times a day  Patient Instructions: 1)  Use  nasal saline irrigation. 2)  Take tylenol as needed. 3)  Call our office if your symptoms do not  improve or  gets worse. Prescriptions: CEFUROXIME AXETIL 500 MG TABS (CEFUROXIME AXETIL) one by mouth two times a day  #20 x 0   Entered and Authorized by:   D. Thomos Lemons DO   Signed by:   D. Thomos Lemons DO on 09/25/2009   Method used:   Print then Give to Patient   RxID:   [id number]    Orders Added: 1)  Est. Patient Level III [29528]   Current Allergies (reviewed today): ! PCN  Appended Document: Orders Update    Clinical Lists Changes  Orders: Added new Service order of Influenza Vaccine NON MCR (41324) - Signed Added new Service order of Admin 1st Vaccine (40102) - Signed Observations: Added new observation of FLU VAX#1VIS: 07/28/2009 (09/25/2009 14:29) Added new observation of FLU  VAXLOT: [id number] (09/25/2009 14:29) Added new observation of FLU VAX EXP: 06/17/2010 (09/25/2009 14:29) Added new observation of FLU VAXBY: Darlene Knight CMA (09/25/2009 14:29) Added new observation of FLU VAXRTE: IM (09/25/2009 14:29) Added new observation of FLU VAX DSE: 0.5 ml (09/25/2009 14:29) Added new observation of FLU VAXMFR: GlaxoSmithKline (09/25/2009 14:29) Added new observation of FLU VAX SITE: left deltoid (09/25/2009 14:29) Added new observation of FLU VAX: Fluvax Non-MCR (09/25/2009 14:29)       Immunizations Administered:  Influenza Vaccine # 1:    Vaccine Type: Fluvax Non-MCR    Site: left deltoid    Mfr: GlaxoSmithKline    Dose: 0.5 ml    Route: IM    Given by: Glendell Docker CMA    Exp. Date: 06/17/2010    Lot #: [id number]    VIS given: 07/28/2009  Flu Vaccine Consent Questions:    Do you have a history of severe allergic reactions to this vaccine? no    Any prior history of allergic reactions to egg and/or gelatin? no    Do you have a sensitivity to the preservative Thimersol? no    Do you have a past history of Guillan-Barre Syndrome? no    Do you currently have an acute febrile illness? no    Have you ever had a severe reaction to latex? no    Vaccine information given and explained to patient? yes    Are you currently pregnant? no

## 2011-01-20 NOTE — Procedures (Signed)
Summary: Gastroenterology colon  Gastroenterology colon   Imported By: Lorre Munroe RN 01/07/2008 15:30:24  _____________________________________________________________________  External Attachment:    Type:   Image     Comment:   External Document

## 2011-01-20 NOTE — Progress Notes (Signed)
Summary: No improvement  Phone Note Call from Patient   Caller: Patient Summary of Call: Pt seen Dr Artist Pais last friday and is not any better wants  to know if she can get antibotic for her sinus infection Initial call taken by: Darral Dash,  January 29, 2010 8:13 AM  Follow-up for Phone Call        patient states she has an increase in weakness, and fever of  100, sinus drainage. She would like to know if she could get a rx other than pcn or Ceftin. She prefers Biaxin or Zpak. Follow-up by: Glendell Docker CMA,  January 29, 2010 8:34 AM  Additional Follow-up for Phone Call Additional follow up Details #1::        Left a detailed mess. letting pt. know Rx. was called into pharmacy. Additional Follow-up by: Michaelle Copas,  January 29, 2010 2:36 PM    New/Updated Medications: AZITHROMYCIN 250 MG TABS (AZITHROMYCIN) 2 tabs on day 1, then one by mouth once daily Prescriptions: AZITHROMYCIN 250 MG TABS (AZITHROMYCIN) 2 tabs on day 1, then one by mouth once daily  #6 x 0   Entered and Authorized by:   D. Thomos Lemons DO   Signed by:   D. Thomos Lemons DO on 01/29/2010   Method used:   [facility]        [facility]  [address] [phone]* (retail)       [address]       [place], [place]  [address]       Ph: [phone] or [phone]       Fax: [phone]   RxID:   [id number]

## 2011-01-20 NOTE — Assessment & Plan Note (Signed)
Summary: f/u   Vital Signs:  Patient Profile:   55 Years Old Female Height:     65.5 inches Weight:      193 pounds BMI:     31.74 O2 treatment:    Room Air Temp:     97.8 degrees F oral Pulse rate:   78 / minute Pulse rhythm:   regular Resp:     18 per minute BP sitting:   122 / 78  (left arm) Cuff size:   regular  Vitals Entered By: Darra Lis RMA (October 16, 2008 8:51 AM)                 Visit Type:  PE PCP:  Paulo Fruit MD  Chief Complaint:  Full PE.  History of Present Illness: Patient is her for a PE and fasting lab.  Patient sees gyn and is UTD on mammogram and pap.    Patient has been seeing an Orthopedist for her right  knee - severe arthritis.  She has had a steroid injection and has started physical therapy.  she is having some improvement.  Patient had her trazodone increased at her last appt for both insomnia and mild depression.  she says it is helping with both very well.    Patient wants to get a refill on allegra for her allergies.  Has used in the past.  she is on singulair already, but needs something as needed when she has flares.    GERD symptoms are stable on current meds.    Updated Prior Medication List: NEXIUM 40 MG CPDR (ESOMEPRAZOLE MAGNESIUM) Take 1 tablet by mouth once a day TRAZODONE HCL 50 MG TABS (TRAZODONE HCL) 1-3  tab by mouth at bedtime as needed SINGULAIR 10 MG TABS (MONTELUKAST SODIUM) Take 1 tablet by mouth once a day ALPRAZOLAM 0.25 MG TABS (ALPRAZOLAM) as needed SPIRONOLACTONE 25 MG TABS (SPIRONOLACTONE) as needed MELOXICAM 7.5 MG TABS (MELOXICAM) Take 1 tablet by mouth once a day  Current Allergies (reviewed today): ! PCN  Past Medical History:    Current Problems:     PMS (ICD-625.4) - gyn uses alprazolam and spironolactone prn for treatment    GASTRIC POLYP (ICD-211.1)    ESOPHAGEAL STRICTURE (ICD-530.3)    HIATAL HERNIA (ICD-553.3)    DIVERTICULOSIS, COLON (ICD-562.10)    ASTHMA (ICD-493.90)    GERD  (ICD-530.81)  - sees GI    Allergic rhinitis    Osteoarthritis - sees ortho - has had steroid shot in the knee  Past Surgical History:    Reviewed history from 01/04/2008 and no changes required:       tubal ligation and ablation 2004       sinus surgery   Family History:    Reviewed history from 09/19/2008 and no changes required:       Family History of Arthritis       Family History Diabetes 1st degree relative  Social History:    Reviewed history from 09/19/2008 and no changes required:       Occupation: unemployed Environmental health practitioner       Married       2 children   Risk Factors: Tobacco use:  never Passive smoke exposure:  no Drug use:  no HIV high-risk behavior:  no Caffeine use:  1 drinks per day Alcohol use:  no Exercise:  no Seatbelt use:  100 % Sun Exposure:  remote  Family History Risk Factors:    Family History of MI in females < 2  years old:  no    Family History of MI in males < 39 years old:  no  Colonoscopy History:    Date of Last Colonoscopy:  12/19/2007  Mammogram History:    Date of Last Mammogram:  04/17/2008  PAP Smear History:    Date of Last PAP Smear:  04/17/2008   Review of Systems       The patient denies anorexia, fever, weight loss, weight gain, vision loss, decreased hearing, hoarseness, chest pain, syncope, dyspnea on exertion, peripheral edema, prolonged cough, headaches, hemoptysis, abdominal pain, melena, hematochezia, severe indigestion/heartburn, hematuria, incontinence, genital sores, muscle weakness, transient blindness, difficulty walking, unusual weight change, abnormal bleeding, enlarged lymph nodes, angioedema, and breast masses.     Physical Exam  General:     Well-developed, well nourished, well hydrated, in no acute distress; appropriate and cooperative   Head:     Normocephalic and atraumatic without obvious abnormalities.  Eyes:     No corneal or conjunctival inflammation. EOMI. PERRLA. Funduscopic exam  benign, Vision grossly normal. Ears:     External ear shows no significant lesions or deformities.  Otoscopic examination reveals clear canals, tympanic membranes normal bilaterally. Hearing is grossly normal. Nose:     External nasal examination shows no deformity or inflammation.. Nasal mucosa are pink and moist without lesions or exudates. Mouth:     Oral mucosa and oropharynx without lesions, erythema or exudates.  no tongue abnormalities.   Neck:     supple, full ROM, no masses, and no thyromegaly.   Chest Wall:     No deformities, masses, or tenderness noted. Lungs:     Normal respiratory effort, chest expands symmetrically with good air flow noted. Lungs clear to auscultation with no crackles, rales or wheezes.   Heart:     Normal rate and  rhythm. S1 and S2 normal, without gallop, murmur, click, rub  Abdomen:     Bowel sounds positive, abdomen soft and non-tender without masses, organomegaly or hernias noted. no distention  Msk:     No gross deformity noted of cervical, thoracic or lumbar spine. normal ROM. no muscle atrophy noted.   Extremities:     No clubbing, cyanosis, edema, or deformity noted, grossly normal full range of motion with all joints - except R knee with sl decrease.   Neurologic:     alert & oriented X3,  CN II-XII intact, gait normal.  no deficit in strength noted, no balance problems noted, neuro is grossly intact Skin:     Intact without suspicious lesions or rashes Psych:     Cognition and judgment appear intact. Alert and cooperative with normal attention span and concentration. No apparent delusions, illusions, hallucinations, normally interactive, good eye contact, not anxious or agitated.       Impression & Recommendations:  Problem # 1:  WELL ADULT (ICD-V70.0) preventive topics reviewed Orders: TLB-CMP (Comprehensive Metabolic Pnl) (80053-COMP) TLB-CBC Platelet - w/Differential (85025-CBCD) TLB-TSH (Thyroid Stimulating Hormone)  (84443-TSH) TLB-Lipid Panel (80061-LIPID)   Problem # 2:  INSOMNIA (ICD-780.52) continue with current meds Orders: TLB-CMP (Comprehensive Metabolic Pnl) (80053-COMP) TLB-CBC Platelet - w/Differential (85025-CBCD) TLB-TSH (Thyroid Stimulating Hormone) (84443-TSH) TLB-Lipid Panel (80061-LIPID)   Problem # 3:  DEPRESSION, MILD (ICD-311) continue with current meds Her updated medication list for this problem includes:    Trazodone Hcl 50 Mg Tabs (Trazodone hcl) .Marland Kitchen... 1-3  tab by mouth at bedtime as needed    Alprazolam 0.25 Mg Tabs (Alprazolam) - given by GYN for PMS -  not  to be used within 8 hours of trazodone.  Orders: TLB-CMP (Comprehensive Metabolic Pnl) (80053-COMP) TLB-CBC Platelet - w/Differential (85025-CBCD) TLB-TSH (Thyroid Stimulating Hormone) (84443-TSH) TLB-Lipid Panel (80061-LIPID)  Her updated medication list for this problem includes:    Trazodone Hcl 50 Mg Tabs (Trazodone hcl) .Marland Kitchen... 1-3  tab by mouth at bedtime as needed    Alprazolam 0.25 Mg Tabs (Alprazolam) .Marland Kitchen... As needed   Problem # 4:  OSTEOARTHRITIS (ICD-715.90) Continue with ortho and current meds. Her updated medication list for this problem includes:    Meloxicam 7.5 Mg Tabs (Meloxicam) .Marland Kitchen... Take 1 tablet by mouth once a day  Orders: TLB-CMP (Comprehensive Metabolic Pnl) (80053-COMP) TLB-CBC Platelet - w/Differential (85025-CBCD) TLB-TSH (Thyroid Stimulating Hormone) (84443-TSH) TLB-Lipid Panel (80061-LIPID)   Problem # 5:  ALLERGIC RHINITIS (ICD-477.9) Med refilled Her updated medication list for this problem includes:    Fexofenadine Hcl 180 Mg Tabs (Fexofenadine hcl) ..... One tab by mouth once daily as needed   Problem # 6:  GERD (ICD-530.81) continue with current meds and handout given updated medication list for this problem includes:    Nexium 40 Mg Cpdr (Esomeprazole magnesium) .Marland Kitchen... Take 1 tablet by mouth once a day  Her updated medication list for this problem includes:     Nexium 40 Mg Cpdr (Esomeprazole magnesium) .Marland Kitchen... Take 1 tablet by mouth once a day   Problem # 7:  ENCOUNTER FOR LONG-TERM USE OF OTHER MEDICATIONS (ICD-V58.69)  Orders: TLB-CMP (Comprehensive Metabolic Pnl) (80053-COMP) TLB-CBC Platelet - w/Differential (85025-CBCD) TLB-TSH (Thyroid Stimulating Hormone) (84443-TSH) TLB-Lipid Panel (80061-LIPID)   Complete Medication List: 1)  Nexium 40 Mg Cpdr (Esomeprazole magnesium) .... Take 1 tablet by mouth once a day 2)  Trazodone Hcl 50 Mg Tabs (Trazodone hcl) .Marland Kitchen.. 1-3  tab by mouth at bedtime as needed 3)  Singulair 10 Mg Tabs (Montelukast sodium) .... Take 1 tablet by mouth once a day 4)  Alprazolam 0.25 Mg Tabs (Alprazolam) .... As needed 5)  Spironolactone 25 Mg Tabs (Spironolactone) .... As needed 6)  Meloxicam 7.5 Mg Tabs (Meloxicam) .... Take 1 tablet by mouth once a day 7)  Fexofenadine Hcl 180 Mg Tabs (Fexofenadine hcl) .... One tab by mouth once daily as needed   Patient Instructions: 1)  Please schedule a follow-up appointment in 6 months.   Prescriptions: FEXOFENADINE HCL 180 MG TABS (FEXOFENADINE HCL) one tab by mouth once daily as needed  #90 x 1   Entered and Authorized by:   Paulo Fruit MD   Signed by:   Paulo Fruit MD on 10/16/2008   Method used:   [facility]        [facility]  [address] [phone][facility] ([facility]ail[facility]       [address]       [place], [place]  [address]       Ph: [phone] or [phone]       Fax: [phone]   RxID:   [id number]  ]

## 2011-01-20 NOTE — Progress Notes (Signed)
Summary: rf nexium  Phone Note Refill Request Message from:  Patient on January 02, 2009 11:41 AM  Refills Requested: Medication #1:  NEXIUM 40 MG CPDR Take 1 tablet by mouth once a day original prescription written by Dr. Juanda Chance would like #[facility]   Method Requested: Electronic Initial call taken by: Shary Decamp,  January 02, 2009 11:42 AM  Follow-up for Phone Call        sent.  please let patient know.  thanks Follow-up by: Paulo Fruit MD,  January 02, 2009 12:00 PM  Additional Follow-up for Phone Call Additional follow up Details #1::        PT AWARE Additional Follow-up by: Shary Decamp,  January 02, 2009 12:03 PM    New/Updated Medications: NEXIUM 40 MG CPDR (ESOMEPRAZOLE MAGNESIUM) Take 1 cap by mouth once a day   Prescriptions: NEXIUM 40 MG CPDR (ESOMEPRAZOLE MAGNESIUM) Take 1 cap by mouth once a day  #90 x 1   Entered and Authorized by:   Paulo Fruit MD   Signed by:   Paulo Fruit MD on 01/02/2009   Method used:   [facility]        [facility]  [address] [phone]* (retail)       [address]       [place], [place]  [address]       Ph: [phone] or [phone]       Fax: [phone]   RxID:   [id number]

## 2011-01-20 NOTE — Assessment & Plan Note (Signed)
Summary: 6 MONTH ROV-CH   Vital Signs:  Patient profile:   55 year old female Weight:      183 pounds BMI:     30.10 O2 Sat:      99 % on Room air Temp:     97.8 degrees F oral Pulse rate:   84 / minute Pulse rhythm:   regular Resp:     16 per minute BP sitting:   100 / 74  (right arm) Cuff size:   large  Vitals Entered By: Glendell Docker CMA (January 22, 2010 4:16 PM)  O2 Flow:  Room air  Primary Care Provider:  D. Thomos Lemons DO  CC:  6 Month Follow up.  History of Present Illness: 6 Month Follow up  55 y/o white female for f/u.  Since prev visit ,  she had knee surgery.  a lot more traumatic than she thought.  going through rehab  URI - mild nasal congestion.  no purulent discharge. needs allergy meds refilled.  Preventive Screening-Counseling & Management  Alcohol-Tobacco     Smoking Status: never  Allergies: 1)  ! 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   Ears:  R ear normal and L ear normal.   Mouth:  pharynx pink and moist.   Lungs:  normal respiratory effort and normal breath sounds.   Heart:  normal rate, regular rhythm, and no gallop.      Impression & Recommendations:  Problem # 1:  ALLERGIC RHINITIS (ICD-477.9) Refilled meds.  Use nasal saline irrigation esp during spring months Her updated medication list for this problem includes:    Zyrtec Allergy 10 Mg Caps (Cetirizine hcl) .Marland Kitchen... Take 1 capsule by mouth once a day as needed  Problem # 2:  GERD (ICD-530.81) Assessment: Unchanged stable.  Maintain current medication regimen.  Her updated medication list for this problem includes:    Nexium 40 Mg Cpdr (Esomeprazole magnesium) .Marland Kitchen... Take 1 cap by mouth once a day  Complete Medication List: 1)  Nexium 40 Mg Cpdr (Esomeprazole magnesium) .... Take 1 cap by mouth once a day 2)  Trazodone Hcl 100 Mg Tabs (Trazodone hcl) .... Take 1/2 to one  tab by mouth at bedtime as needed 3)  Singulair 10 Mg Tabs (Montelukast sodium) .... Take 1 tablet by mouth once a day 4)  Alprazolam 0.25 Mg Tabs (Alprazolam) .... As needed 5)  Spironolactone 25 Mg Tabs (Spironolactone) .... Take 1 tablet by mouth once a day as needed 6)  Meloxicam 7.5 Mg Tabs (Meloxicam) .... Take 1 tablet by mouth once a day 7)  Zyrtec Allergy 10 Mg Caps (Cetirizine hcl) .... Take 1 capsule by mouth once a day as needed 8)  Hydrocodone-acetaminophen 7.5-325 Mg Tabs (Hydrocodone-acetaminophen) .Marland KitchenMarland KitchenMarland Kitchen  One tablet every 6 hours as needed pain 9)  Azithromycin 250 Mg Tabs (Azithromycin) .... 2 tabs on day 1, then one by mouth once daily  Patient Instructions: 1)  Please schedule a follow-up appointment in 6 months. 2)  BMP prior to visit, ICD-9: v70 3)  Lipid Panel prior to visit, ICD-9: v70 4)  TSH prior to visit, ICD-9: v70 5)  CBC prior to visit, ICD-9: v70 6)  Please return for lab work one (1) week before your next appointment.  (fasting) Prescriptions: SINGULAIR 10 MG TABS (MONTELUKAST SODIUM) Take 1 tablet by mouth once a day  #90 x 3   Entered and Authorized by:   D. Thomos Lemons DO   Signed by:   D. Thomos Lemons DO on 01/22/2010   Method used:    [facility]ically to        [facility]  [address] [phone][facility] (retail)       [address]       [place], [place]  [address]       Ph: [phone] or [phone]       Fax: [phone]   RxID:   [id number] TRAZODONE HCL 100 MG TABS (TRAZODONE HCL) Take 1/2 to one  tab by mouth at bedtime as needed  #90 x 3   Entered and Authorized by:   D. Thomos Lemons DO   Signed by:   D. Thomos Lemons DO on 01/22/2010   Method used:   [facility]ically [facility]        [facility]  [address] [phone]* (retail)       [address]       [place], [place]  [address]       Ph: [phone] or [phone]       Fax: [phone]   RxID:   [id number] NEXIUM 40 MG CPDR (ESOMEPRAZOLE MAGNESIUM) Take 1 cap by mouth once a day  #90 x 3   Entered and Authorized by:   D. Thomos Lemons DO   Signed by:   D. Thomos Lemons DO on 01/22/2010   Method used:   [facility]        [facility]  [address] [phone][facility] (retail)       [address]       [place], [place]  [address]       Ph: [phone] or [phone]       Fax: [phone]   RxID:   [id number]       Current Allergies (reviewed today): ! PCN CEFUROXIME AXETIL (CEFUROXIME AXETIL)

## 2011-01-20 NOTE — Progress Notes (Signed)
Summary: Urinary Discomfort  Phone Note Call from Patient Call back at Home Phone [phone]   Caller: Patient Call For: D. Thomos Lemons DO Summary of Call: Patient called and left voice message stating she has never recovered from the last UTI that she had, and would like to know if Dr Artist Pais would send a rx in to the pharmacy for her.   call was returned to patient at [phone]. She states she has urinary urgency,denies fever, but feels cold, taken Motrin with no relief. Patient denies any other symptoms, she states she does not feel well. She was informed that Dr Artist Pais does not prescribe antibiotics without an office visit, and was offered a appointment. She declined to schedule and states she would like for me to check with Dr Artist Pais. She states if she has to come in she will. She is also requesting a rx for Estrace cream for vaginal dryness Initial call taken by: Glendell Docker CMA,  July 16, 2010 9:02 AM  Follow-up for Phone Call        needs OV Follow-up by: D. Thomos Lemons DO,  July 16, 2010 9:37 AM  Additional Follow-up for Phone Call Additional follow up Details #1::        Pt advised and follow up scheduled for today at 2pm. Mervin Kung CMA Duncan Dull)  July 16, 2010 9:55 AM

## 2011-01-20 NOTE — Progress Notes (Signed)
Summary: Phone note  Phone Note Call from Patient   Caller: Patient Summary of Call: Patient called insurance company and EKG is part of her yearly physical and she wants to get one done. Patient states she wants it for a base line and the insurance will cover it if its part of her yearly physical coded as a screening EKG. Patient has an appointment the last week of  December-told patient she could discuss it at her next appointment and if Dr. Andrey Campanile agrees I could do it for her then. Initial call taken by: Darra Lis RMA,  December 08, 2008 2:24 PM

## 2011-02-10 ENCOUNTER — Other Ambulatory Visit (INDEPENDENT_AMBULATORY_CARE_PROVIDER_SITE_OTHER): Payer: PRIVATE HEALTH INSURANCE

## 2011-02-10 DIAGNOSIS — Z5189 Encounter for other specified aftercare: Secondary | ICD-10-CM

## 2011-02-17 ENCOUNTER — Telehealth: Payer: Self-pay | Admitting: Internal Medicine

## 2011-02-24 ENCOUNTER — Ambulatory Visit: Payer: PRIVATE HEALTH INSURANCE | Admitting: Internal Medicine

## 2011-02-24 NOTE — Progress Notes (Signed)
Summary: wants to talk about singulair  Phone Note Call from Patient Call back at Home Phone [phone]   Caller: Patient Call For: Lemont Fillers FNP Summary of Call: pt called regarding her singulair rx. she says that she wants to start getting 90 day instead of 30 day. [facility] [place] [phone]. and would like a call back. please assist. Initial call taken by: Elba Barman,  February 17, 2011 4:39 PM  Follow-up for Phone Call        Pt advised 90 day supply has been sent to the pharmacy but she needs to schedule a follow up appt and have labs 1 week prior to appt. Pt scheduled appt for 02/24/11 @ 11am. Labs order entered and faxed to the lab. Nicki Guadalajara Fergerson CMA Duncan Dull)  February 18, 2011 9:05 AM

## 2011-03-28 ENCOUNTER — Telehealth: Payer: Self-pay | Admitting: Internal Medicine

## 2011-03-28 NOTE — Telephone Encounter (Signed)
Refill- trazodone 100mg  tablet. Take 1/2 to 1 tablet at bedtime as needed. Qty 90. Last fill 1.8.12

## 2011-03-29 NOTE — Telephone Encounter (Signed)
Please verify with pharmacy who prescribed this previously- it was not me.

## 2011-03-30 NOTE — Telephone Encounter (Signed)
[facility], spoke with Junious Dresser she was informed refill denied, patient is due for office visit. She was advised to have patient call to schedule follow up appointment with Dr Artist Pais

## 2011-04-25 ENCOUNTER — Encounter: Payer: Self-pay | Admitting: Family

## 2011-05-02 ENCOUNTER — Telehealth: Payer: Self-pay | Admitting: Internal Medicine

## 2011-05-02 NOTE — Telephone Encounter (Signed)
Pt has appt on 05-11-11. Pt wants to know if she needs to go in this week for lab work.

## 2011-05-03 NOTE — Assessment & Plan Note (Signed)
[facility]                         GASTROENTEROLOGY OFFICE NOTE   [name], [name]                     MRN:          [id number]  DATE:11/21/2007                            DOB:          02/02/1956    REFERRING PHYSICIAN:  Aurelio Brash, N.P.   Melissa Middleton is a 55 year old white female with recent onset of  gastroesophageal reflux.  She also has some troubles with swallowing.  She has changed her bowel habits to having somewhat loose bowel  movements, which appear to be urgent, especially postprandially.  She  denies having history of gastroesophageal reflux until last year, when  she lost her job, became more sedentary and gained about 10-15 pounds.  She has not taken any acid-suppressing agent until recently, when she  was given Nexium 40 mg daily.  She has not started taking it yet.  Her  sister is a patient with Crohn's disease of the terminal ileum.  Ms.  Rallis has had some concern about possibility of Crohn's disease.  She  denies any low back pain, aphthous stomatitis, rectal bleeding.   MEDICATIONS:  1. Trazodone 50 mg q.h.s.  2. Singulair 10 mg daily.  3. Xanax 0.5 mg p.r.n.   PAST HISTORY:  Significant for asthmatic bronchitis, for which she takes  inhalers.   ALLERGIES:  She had two operations for deviated septum.  She had tubal  ligation.   FAMILY HISTORY:  Negative for colon cancer.  Positive for Crohn's  disease in her sister, diabetes in her father.   SOCIAL HISTORY:  Married with two children.  She is an Recruitment consultant.  She does not smoke and does not drink alcohol.   REVIEW OF SYSTEMS:  Positive for allergies.  She wears contact lenses.  She has leakage of urine and she has new depression over loss of her  job.   PHYSICAL EXAM:  Blood pressure 102/68, pulse 80 and weight 188 pounds.  She was alert, oriented, in no distress.  Sclera is anicteric.  Oral cavity normal.  NECK:  Supple, no  lymphadenopathy.  LUNGS:  Clear to auscultation, no wheezes or rales.  COR:  With normal S1, S2.  ABDOMEN:  Soft, nontender, with minimal discomfort at the subxiphoid  area in the midline.  Liver edge at costal margin.  Lower abdomen was  normal.  RECTAL EXAM:  Normal rectal tone.  Stool was soft and Hemoccult-  negative.   IMPRESSION:  1. Patient is a 55 year old white female with new-onset      gastroesophageal reflux disease, most likely related to change in      eating habits and weight-gain, as well as possibly due to stress.      She has some dysphagia to solids and liquids, which is suggestive      of esophageal dysmotility, but we need to rule out possibility of      early esophageal stricture or even Barrett's esophagus.  2. Change in the bowel habits, more consistent with irritable bowel      syndrome, rather than with Crohn's disease, although she has a  family history of Crohn's in her sister.  She is Hemoccult-negative      today.   PLAN:  1. Upper endoscopy scheduled with possible esophageal dilatation, if      needed.  2. Start Nexium 40 mg daily.  3. Antireflux measures, which will include weight-loss and dietary      modifications.  4. Patient is a good candidate for screening colonoscopy because of      her age of 61.  If the bowel habits continue to be urgent and even      incontinent, I would use antispasmodics or fiber supplements.     Melissa Middleton. Melissa Chance, MD  Electronically Signed    DMB/MedQ  DD: 11/21/2007  DT: 11/21/2007  Job #: [id number]

## 2011-05-05 NOTE — Telephone Encounter (Signed)
Look like labs already completed. PCP is Sandford Craze

## 2011-05-06 NOTE — H&P (Signed)
[facility]  Patient:    Melissa Middleton, Melissa Middleton Visit Number: [id number] MRN: [id number]          Service Type: Attending:  Nadyne Coombes. Fontaine, M.D. Dictated by:   Nadyne Coombes. Fontaine, M.D. Adm. Date:  10/26/01                           History and Physical  CHIEF COMPLAINT:              Sterilization.  HISTORY OF PRESENT ILLNESS:   A 55 year old G2 P2 female who desires permanent sterilization.  The patient has been counseled for both reversible contraception as well as vasectomy and she elects for permanent sterilization. She actually recently has had an IUD placed although has changed her mind and does not want this, and wants to proceed with  sterilization.  PAST MEDICAL HISTORY:         None.  PAST SURGICAL HISTORY:        Includes deviated nasal septum repair, wisdom teeth, and cesarean section for abruptio placentae.  ALLERGIES:                    PENICILLIN.  REVIEW OF SYSTEMS:            Noncontributory.  SOCIAL HISTORY:               Noncontributory.  FAMILY HISTORY:               Noncontributory.  MEDICATIONS:                  None.  ADMISSION PHYSICAL EXAMINATION:  VITAL SIGNS:                  Afebrile, vital signs are stable.  HEENT:                        Normal.  LUNGS:                        Clear.  CARDIAC:                      Regular rate without rubs, murmurs, or gallops.  ABDOMEN:                      Benign.  PELVIC:                       External, BUS, vagina normal.  Cervix normal. IUD string visualized.  The uterus normal size, nontender.  Adnexa without masses or tenderness.  ASSESSMENT:                   A 55 year old G2 P2 female, IUD birth control, who desires permanent sterilization after counseling for alternatives.  The risks, benefits, indications, and alternatives for the procedure were reviewed with her and I discussed expected intraoperative and postoperative courses.  I reviewed what is involved  with laparoscopic tubal sterilization to include instrumentation, insufflation, trocar placement, use of Falope rings, as well as bipolar cautery backup.  I reviewed the permanency of the procedure as well as the risk of failure and she understands and accepts the possibilities of failure.  The risk of inadvertent injury to internal organs including bowel, bladder, ureters, vessels, and nerves necessitating major exploratory reparative surgeries and future reparative surgeries including ostomy  formation were all discussed, understood, and accepted.  The risk of hemorrhage necessitating transfusion and the risk of transfusion were reviewed, as well as the risk of infection.  The patients questions were answered to her satisfaction and she is ready to proceed with surgery. Dictated by:   Nadyne Coombes. Fontaine, M.D. Attending:  Nadyne Coombes. Fontaine, M.D. DD:  10/23/01 TD:  10/23/01 Job: [id number]

## 2011-05-06 NOTE — H&P (Signed)
NAME:  Melissa Middleton, Melissa Middleton                        ACCOUNT NO.:  [id number]   MEDICAL RECORD NO.:  [id number]                   PATIENT TYPE:  AMB   LOCATION:  [facility]                                 FACILITY:  [facility]   PHYSICIAN:  Juan H. Lily Peer, M.D.             DATE OF BIRTH:  [birth year]   DATE OF ADMISSION:  DATE OF DISCHARGE:                                HISTORY & PHYSICAL   DATE OF ADMISSION:  Patient is scheduled for surgery Friday, February 27, 2004  at 12:30 P.M. at [facility].   CHIEF COMPLAINT:  1. Menorrhagia.  2. Request for elective permanent sterilization.   HISTORY OF PRESENT ILLNESS:  The patient is a 55 year old gravida 2, para 2  who has been suffering for quite some time with menorrhagia lasting up to 10  days for the past three months.  She had also complained of weight gain.  Her evaluation had consisted of endometrial biopsy on December 30, 2003 which  demonstrated fragmented proliferative endometrium with focal glandular and  stromal breakdown.  There was no evidence of hyperplasia and her Pap smear  recently was normal.  She had a TSH recently which was normal as well.  She  had a sonohysterogram done on February 10, 2004 which was essentially  unremarkable.  Her cavity was normal.  Uterus was normal size.  Right ovary  was normal.  Left ovary had a thick wall cyst characteristic of a corpus  luteum cyst measuring 26 X 25 mm.  The patient was also requesting an  elective permanent sterilization and several options were provided.  She  would like to proceed with a laparoscopic tubal sterilization at the same  time of her endometrial ablation.  She was seen for preoperative  consultation on February 10, 2004.  The risks, benefits, pros and cons were  discussed as well as literature information on both topics being provided.   PAST MEDICAL HISTORY:   ALLERGIES:  She is allergic to PENICILLIN.   OBSTETRICAL HISTORY:  She has had one normal  spontaneous vaginal delivery.   MEDICATIONS:  The patient takes Trazodone for insomnia.  She has been on  Ortho Tri-Cyclen low in an effort to regulate her cycles.  She takes Sarafem  for PMDD on a PRN basis and takes calcium supplementation.  She has also had  one Cesarean section as well.   FAMILY HISTORY:  Significant for diabetes in her father and grandmother but  both non-insulin-dependent diabetes mellitus and hypertension in mother,  father and grandparents.   PHYSICAL EXAMINATION:  VITAL SIGNS:  She is 5 feet, 6 inches tall with a  weight of 172 pounds.  Blood pressure 121/70.  HEENT:  Unremarkable.  NECK:  Supple.  Trachea midline.  No carotid bruits.  No thyromegaly.  LUNGS:  Clear to auscultation without any rhonchi or wheezes.  HEART:  Regular rate and rhythm with no murmurs or gallops.  BREAST:  Examination at time of her annual examination on December 30, 2003  was reportedly normal.  ABDOMEN:  Soft, nontender without rebound, guarding.  PELVIC:  Bartholin's, urethral, Skene's glands within normal limits.  Vagina  and cervix with no lesions or discharge.  Uterus anteverted, normal size,  shape and consistency.  Adnexa without any masses or tenderness.  Rectal  examination is deferred.   ASSESSMENT:  The patient is a 55 year old gravida 2, para 2 with  longstanding history of menorrhagia, negative work up.  Patient is  interested in proceeding with elective sterilization so we have scheduled  her to undergo a laparoscopic tubal sterilization followed by an endometrial  ablation.  Different techniques were discussed with the patient.  We will  proceed with hydrothermal ablation.  Literature information on both surgical  procedures was provided to the patient.  She is fully aware that she will  not be able to have any more children.  The risks, benefits, pros and cons  have been outlined.  We will follow accordingly.   PLAN:  1. Patient is scheduled for laparoscopic  tubal sterilization.  2. Endometrial ablation hydrothermal ablation technique on Friday, February 27, 2004 at 12:30 P.M. in [facility].                                               Juan H. Lily Peer, M.D.    JHF/MEDQ  D:  02/23/2004  T:  02/23/2004  Job:  [id number]

## 2011-05-06 NOTE — Op Note (Signed)
[facility]  Patient:    Melissa Middleton, Melissa Middleton                     MRN: [id number] Proc. Date: 05/01/01 Adm. Date:  [id number] Attending:  Merrily Pew                           Operative Report  PREOPERATIVE DIAGNOSES:       1. Third trimester bleeding.                               2. 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  POSTOPERATIVE DIAGNOSES:      1. Third trimester bleeding.                               2. 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  PROCEDURE:                    Primary low transverse cervical cesarean section.  SURGEON:                      Timothy P. Fontaine, M.D.  ANESTHESIA:                   Spinal.  ESTIMATED BLOOD LOSS:         Approximately 800 cc.  COMPLICATIONS:                None.  SPECIMENS:                    Samples of cord blood, arterial cord pH.  FINDINGS:                     At 29, normal female, Apgars 8 and 9, pH 7.27, weight 6 lb 1 oz.  Pelvic anatomy noted to be normal.  Placenta with large adherent clot at the lower edge with a significant amount of clot between the vertex and the cervix.  DESCRIPTION OF PROCEDURE:     The patient was taken to the operating room and underwent spinal anesthesia.  She was placed in the left tilt supine position and received an abdominal preparation with Betadine scrub and solution.  A Foley catheter was placed with sterile technique.  The patient was draped in the usual fashion.  After assuring adequate anesthesia, the abdomen was sharply entered through a Pfannenstiel incision, achieving adequate hemostasis at all levels.  A bladder flap was sharply and bluntly developed without difficulty.  The uterus was sharply entered in the lower uterine segment and bluntly extended laterally.  The bulging membranes were ruptured and the fluid noted to be clear.  The vertex was delivered.  The nares and mouth were suctioned. The rest of the infant was delivered.  The cord was doubly  clamped and cut.  The infant was handed to pediatrics in attendance.  Samples of cord blood were obtained as well as arterial cord pH.  The placenta was then spontaneously extruded and noted to be intact with description above.  The uterus was exteriorized and the endometrial canal explored with a sponge to remove all placental membrane fragments.  The uterine incision was closed in two layers using 0 Vicryl suture, first in a running interlocking stitch, followed by an imbricating stitch.  Several interrupted figure-of-eight sutures were  subsequently placed to achieve ultimate hemostasis.  The patient received 1 g Cefotan IV prophylaxis.  The uterus was returned to the abdomen, which was copiously irrigated.  Adequate hemostasis was visualized and the anterior fascia was then reapproximated using 0 Vicryl suture in a running stitch.  The subcutaneous tissues were irrigated and adequate hemostasis visualized.  The skin was reapproximated with staples.  A sterile dressing was applied and the patient was taken to the recovery room in good condition, having tolerated the procedure well. DD:  05/01/01 TD:  05/01/01 Job: [id number]

## 2011-05-06 NOTE — Op Note (Signed)
NAME:  Melissa Middleton, Melissa Middleton                        ACCOUNT NO.:  [id number]   MEDICAL RECORD NO.:  [id number]                   PATIENT TYPE:  AMB   LOCATION:  [facility]                                 FACILITY:  [facility]   PHYSICIAN:  Juan H. Lily Peer, M.D.             DATE OF BIRTH:  11-27-1956   DATE OF PROCEDURE:  02/27/2004  DATE OF DISCHARGE:                                 OPERATIVE REPORT   INDICATION FOR OPERATION:  A 55 year old, gravida 2, para 2, with history of  menorrhagia.  Work-up preoperatively has consisted of benign endometrial  biopsy and normal sonohysterogram and normal Pap smear.  The patient also  requesting elective permanent sterilization.   PREOPERATIVE DIAGNOSES:  1. Menorrhagia.  2. Request for elective permanent sterilization.   POSTOPERATIVE DIAGNOSES:  1. Menorrhagia.  2. Request for elective permanent sterilization.   ANESTHESIA:  General endotracheal anesthesia.   SURGEON:  Juan H. Lily Peer, M.D.   PROCEDURES PERFORMED:  1. Laparoscopic tubal sterilization procedure, Hulka clip technique,     bilateral.  2. Endometrial ablation, hydrothermal ablation technique.   DESCRIPTION OF OPERATION:  After the patient was adequately counseled, she  was taken to the operating room where she underwent a successful general  endotracheal anesthesia.  She was placed in the low lithotomy position.  The  abdomen, vagina, and perineum were prepped and draped in the usual sterile  fashion.  A red rubber Robinson advanced and utilized to evacuate the  bladder of its contents for approximately 50 mL.  Examination under  anesthesia demonstrated a slightly anteverted uterus and no palpable adnexal  masses.  A Hulka tenaculum was placed in an effort to manipulate during the  laparoscopic procedure.  An open laparoscopic technique was utilized in an  effort to gain entrance into the peritoneal cavity.  An additional 5 mm port  was made midline 2 cm from the symphysis pubis  under laparoscopic guidance.  A systematic inspection of the uterus and tubes with no gross abnormalities  noted.  The right fallopian tube was under tension, and the proximal one-  third portion was identified, and a Hulka clip was applied completely  occluding that segment of the tube.  A similar procedure was carried out on  the contralateral side.  Of note, 3 liters of carbon dioxide were used as an  insufflation.  Once this was completed, the carbon dioxide was removed from  the peritoneal cavity.  The instruments were removed.  The fascia had been  closed with a pursestring suture of 1-0 __________ 0 Vicryl suture, and the  skin was reapproximated with interrupted sutures of 4-0 plain catgut suture  as was the 5 mm port site as well.  Marcaine 0.25% for approximately 15 mL  was infiltrated in both incision sites for postoperative analgesia.  Attention then was placed in the endometrial ablation portion.  A speculum  was introduced into the vagina.  The Hulka tenaculum  was removed and changed  for a single-tooth tenaculum. The cervix was sounded to approximately 7 mm,  and the uterus was dilated to a 23 mm size dilator, and the [employer] HydroThermAblator ablation probe was introduced into the  endocervical canal under visualization.  Normal saline was the distending  medium.  A thorough inspection in the intrauterine cavity demonstrated a  normal endocervical canal.  No intrauterine pathology, and both tubal ostia  were identified.  A cooling phase was utilized for inspection of the uterine  cavity and when in proper placement of the probe was in the portion of the  internal cervical os, the heating phase commenced to get to a temperature of  80 degrees Celsius, and the endometrium was ablated with heated water for  approximately 10 minute period.  Following this, the cooling phase for  several minutes, and the indicator on the instrument indicated it was safe  to  remove the probe.  Pre and postablation pictures were obtained.  The  single-tooth tenaculum was removed.  Silver nitrate was applied to the  cervical lip where it had been bleeding a little bit from the tenaculum.  The patient was extubated, transferred to recovery room with stable vital  signs.  Blood loss from procedure was minimal, and fluid resuscitation  consisted of 1200 mL of lactated Ringer's.                                               Juan H. Lily Peer, M.D.    JHF/MEDQ  D:  02/27/2004  T:  02/27/2004  Job:  [id number]

## 2011-05-06 NOTE — Discharge Summary (Signed)
[facility]  Patient:    Melissa Middleton, Melissa Middleton                     MRN: [id number] Adm. Date:  [id number] Disc. Date: [id number] Attending:  Merrily Pew Dictator:   Antony Contras, N.P.                           Discharge Summary  DISCHARGE DIAGNOSES:          Intrauterine pregnancy at 35 1/2 weeks, abruption of lower edge of the placenta.  PROCEDURE:                    Low cervical transverse cesarean section with delivery of viable infant.  HISTORY OF PRESENT ILLNESS:   Patient is a 55 year old gravida 2, para 0-1-0-1 with an LMP of August 25, 2000, [facility] June 01, 2001.  Prenatal risk factors include a history of advanced maternal age, previous preterm delivery.  PRENATAL LABORATORIES:        Blood type O+.  Antibody screen negative.  RPR, HBSAG, HIV nonreactive.  Rubella immune.  Amniocentesis was normal.  HOSPITAL COURSE:              Patient presented on May 01, 2001 with painless vaginal bleeding.  She had had no precipitating event such as trauma.  General digital examination was performed to rule out imminent delivery.  Ultrasound did show tissue in front of the vertex.  It was decided at this point to proceed with cesarean section.  This was performed by Dr. Audie Box under spinal anesthesia.  Patient was delivered of an Apgar 8/9 female infant weighing 6 pounds 1 ounce.  The pelvic anatomy was noted to be normal.  The placenta showed a large adherent clot at the lower edge with a ______ amount of clot between the vertex and the cervix.  Postoperative patient remained afebrile, had no difficulty voiding.  She did have significant anemia with hematocrit 20.5, hemoglobin 6.8, WBC 10.4, platelets 260,000.  She was able to be discharged on her third postoperative day in satisfactory condition.  DISPOSITION:                  Follow-up in six weeks.  Continue with prenatal vitamins and iron.  Motrin and Tylox for pain. DD:  05/21/01 TD:   05/22/01 Job: [id number]

## 2011-05-06 NOTE — Telephone Encounter (Signed)
Call placed to patient at [phone] she was advised per Dr Artist Pais instructions. She states that she recently had the blood work done about a week ago. She does not have any concerns at this time

## 2011-05-06 NOTE — Telephone Encounter (Signed)
Call returned to patient at [phone], no answer, no voice mail

## 2011-05-06 NOTE — H&P (Signed)
[facility] of [facility]  Patient:    Melissa Middleton, Melissa Middleton                       MRN: [id number] Adm. Date:  05/01/01 Attending:  Marcial Pacas P. Fontaine, M.D.                         History and Physical  CHIEF COMPLAINT:              Third trimester bleeding.  HISTORY OF PRESENT ILLNESS:   The patient is a 55 year old G2, P32 female at 35-1/[redacted] weeks gestation who enters with complaints of painless vaginal bleeding.  The patient noted that she was doing well until this evening at approximately 12:00 a.m., at which time she had the onset of painless bleeding while she was sleeping, thinking that her water had broke and then realizing that she was bleeding like a heavy period.  Her prenatal course was otherwise uncomplicated, noting normal second trimester ultrasound without evidence of placenta previa.  The patient denies any contractions or pain.  She does have a history of preterm delivery at 36 weeks.  PAST MEDICAL HISTORY:         Uncomplicated.  PAST SURGICAL HISTORY:        Deviated nasal septum repair.  Wisdom teeth.  ALLERGIES:                    Penicillin.  REVIEW OF SYSTEMS:            Noncontributory.  ADMISSION PHYSICAL EXAMINATION:  VITAL SIGNS:                  Afebrile with stable vital signs.  HEENT:                        Normal.  LUNGS:                        Clear.  CARDIAC:                      Regular rate without murmurs, rubs, or gallops.  ABDOMEN:                      Benign.  The uterus is appropriate for dates. The uterus is firm and nontender.  External monitor shows a reactive fetus without regular contractions.  PERINEUM:                     Menses-type bleeding.  Gentle digital exam shows the presenting part high.  The cervix was not palpated although, again, this was limited to a lower vaginal exam.  ASSESSMENT:                   A 55 year old G75, P65 female at 35-1/[redacted] weeks gestation with painless third trimester bleeding and no  precipitating events such as trauma.  External monitor is without evidence of overt labor. Reactive fetus.  Significant bleeding on the perineum.  Gentle digital rule out imminent delivery without palpation of presenting part.  History of second trimester ultrasound without evidence of previa.  Will initiate with ultrasound now.  Rule out abruption or abnormal placentation.  Reevaluate bleeding.  After ultrasound, baseline labs to consider delivery as discussed with the patient. DD:  05/01/01 TD:  05/01/01 Job: [id number]

## 2011-05-11 ENCOUNTER — Ambulatory Visit (INDEPENDENT_AMBULATORY_CARE_PROVIDER_SITE_OTHER): Payer: PRIVATE HEALTH INSURANCE | Admitting: Internal Medicine

## 2011-05-11 ENCOUNTER — Encounter: Payer: Self-pay | Admitting: Internal Medicine

## 2011-05-11 VITALS — BP 120/90 | HR 65 | Temp 97.6°F | Resp 18 | Wt 192.0 lb

## 2011-05-11 DIAGNOSIS — M25561 Pain in right knee: Secondary | ICD-10-CM | POA: Insufficient documentation

## 2011-05-11 DIAGNOSIS — J309 Allergic rhinitis, unspecified: Secondary | ICD-10-CM

## 2011-05-11 DIAGNOSIS — G4700 Insomnia, unspecified: Secondary | ICD-10-CM

## 2011-05-11 DIAGNOSIS — M25569 Pain in unspecified knee: Secondary | ICD-10-CM

## 2011-05-11 MED ORDER — ESOMEPRAZOLE MAGNESIUM 40 MG PO CPDR: 40.0000 mg | DELAYED_RELEASE_CAPSULE | Freq: Every day | ORAL | Status: DC

## 2011-05-11 MED ORDER — MONTELUKAST SODIUM 10 MG PO TABS: 10.0000 mg | ORAL_TABLET | Freq: Every day | ORAL | Status: DC

## 2011-05-11 MED ORDER — TRAZODONE HCL 100 MG PO TABS: 100.0000 mg | ORAL_TABLET | Freq: Every day | ORAL | Status: DC

## 2011-05-11 NOTE — Assessment & Plan Note (Signed)
Trazodone not as effective as it was in the past.   I suggest pt slowly taper off over next 1-2 months I would like to avoid starting sedative in its place.  We discussed using otc melatonin short term. Patient advised to call office if insomnia worsens.

## 2011-05-11 NOTE — Assessment & Plan Note (Signed)
Pt not happy with results if right knee replacement.   She would like second opinion re: whether further improvement possible w/o redoing knee replacement. Refer to Dr. Despina Hick

## 2011-05-11 NOTE — Progress Notes (Signed)
Subjective:    Patient ID: Melissa Middleton, female    DOB: [birth year], 55 y.o.   MRN: [id number]  HPI  55 y/o white female with hx of allergic rhinitis, gerd, and chronic insomnia for followup.  Pt and her family has had bad year for allergies.  She notes otc zyrtec is somewhat expensive but it is the only antihistamine that seems to help her allergy symptoms.  She has tried otc claritin.  singulair also helpful.  Pt notes trazodone not as effective as it was it the past.    Hx of right knee replacement perform at [facility] hospital.  Pt not happy with results.  Despite rehab, she struggles with knee discomfort.  She feels like her right knee is now longer than left which throws off her gait  Review of Systems   negative for cough  Past Medical History  Diagnosis Date  . Premenstrual tension syndromes     gyn uses alprazolam and spironolactone prn ofr treatment  . Benign neoplasm of stomach     gastric polyps  . Stricture and stenosis of esophagus   . Diaphragmatic hernia without mention of obstruction or gangrene   . Diverticulosis of colon (without mention of hemorrhage)   . Unspecified asthma   . Esophageal reflux     see GI  . Bronchitis     recurrent  . Allergic rhinitis   . Sinusitis     recurrent  . Osteoarthritis     sees ortho-has had steroid shot in knee  . Post menopausal problems     symptoms    History   Social History  . Marital Status: Married    Spouse Name: N/A    Number of Children: 2  . Years of Education: N/A   Occupational History  .      Unemployed Environmental health practitioner   Social History Main Topics  . Smoking status: Never Smoker   . Smokeless tobacco: Not on file  . Alcohol Use: Not on file  . Drug Use: Not on file  . Sexually Active: Not on file   Other Topics Concern  . Not on file   Social History Narrative  . No narrative on file    Past Surgical History  Procedure Date  . Tubal ligation 2004    with ablation  . Nasal sinus  surgery   . 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 Arthritis Other   . Diabetes Other     Allergies  Allergen Reactions  . Cefuroxime Axetil     REACTION: red face/rash  . Penicillins     REACTION: rash    Current Outpatient Prescriptions on File Prior to Visit  Medication Sig Dispense Refill  . albuterol (VENTOLIN HFA) 108 (90 BASE) MCG/ACT inhaler Inhale 2 puffs into the lungs every 6 (six) hours as needed. For wheezing       . ALPRAZolam (XANAX) 0.25 MG tablet 1/2 - 1 tablet as needed       . cetirizine (ZYRTEC) 10 MG tablet Take 10 mg by mouth daily.        . clotrimazole-betamethasone (LOTRISONE) cream Apply topically 2 (two) times daily.        Marland Kitchen esomeprazole (NEXIUM) 40 MG capsule Take 40 mg by mouth daily before breakfast.        . estradiol (ESTRACE) 0.1 MG/GM vaginal cream Place 1 g vaginally 3 (three) times a  week.        . fluticasone (FLONASE) 50 MCG/ACT nasal spray 2 sprays each nostril once daily       . meloxicam (MOBIC) 7.5 MG tablet Take 7.5 mg by mouth daily.        . montelukast (SINGULAIR) 10 MG tablet Take 10 mg by mouth at bedtime.        Marland Kitchen spironolactone (ALDACTONE) 25 MG tablet Take 25 mg by mouth daily as needed.        . traZODone (DESYREL) 100 MG tablet Take 1/2-1 tablet by mouth at bedtime as needed         BP 120/90  Pulse 65  Temp(Src) 97.6 F (36.4 C) (Oral)  Resp 18  Wt 192 lb (87.091 kg)  SpO2 99%    Objective:   Physical Exam    Constitutional: Appears well-developed and well-nourished. No distress.  Right Ear: External ear normal.  Left Ear: External ear normal.  Mouth/Throat: Oropharynx is clear and moist.  Eyes: Conjunctivae are normal. Pupils are equal, round, and reactive to light.  Cardiovascular: Normal rate, regular rhythm and normal heart sounds.  Exam reveals no gallop and no friction rub.   No murmur heard. Pulmonary/Chest: Effort normal and breath sounds  normal.  No wheezes. No rales.  Neurological: Alert. No cranial nerve deficit.  Skin: Skin is warm and dry.  Psychiatric: Normal mood and affect. Behavior is normal.  Msk:  Slight gait abnormality Assessment & Plan:

## 2011-05-11 NOTE — Assessment & Plan Note (Signed)
No change.  Continue zyrtec and singulaire

## 2011-07-11 ENCOUNTER — Other Ambulatory Visit: Payer: Self-pay | Admitting: *Deleted

## 2011-07-11 DIAGNOSIS — F419 Anxiety disorder, unspecified: Secondary | ICD-10-CM

## 2011-07-11 MED ORDER — ALPRAZOLAM 0.25 MG PO TABS: 0.2500 mg | ORAL_TABLET | Freq: Every evening | ORAL | Status: DC | PRN

## 2011-07-11 NOTE — Telephone Encounter (Signed)
RX CALLED INTO [facility]. 

## 2011-07-11 NOTE — Telephone Encounter (Signed)
FAXED REFILL REQUEST FROM [facility]. JW

## 2011-10-05 ENCOUNTER — Telehealth: Payer: Self-pay | Admitting: *Deleted

## 2011-10-05 NOTE — Telephone Encounter (Signed)
PT CALLED COMPLAINING OF UTI. FREQUENT URINATION FOR A COUPLE OF DAYS, BLOATING, NO FEVER. NO BIRTH CONTROL NOR PERIODS. PT STARTED A NEW JOB AND WONT BE ABLE TO COME IN UNTIL SOMETIME NEXT WEEK. SHE WOULD LIKE RX TO HELP WITH UTI. PLEASE ADVISE

## 2011-10-06 NOTE — Telephone Encounter (Signed)
Please call in Septra DS one by mouth twice a day for 3 days #6, office visit if no relief.

## 2011-10-06 NOTE — Telephone Encounter (Signed)
Lm on pt voicemail the below note, rx called in to pharamacy

## 2011-10-11 ENCOUNTER — Other Ambulatory Visit: Payer: Self-pay | Admitting: *Deleted

## 2011-10-11 DIAGNOSIS — F419 Anxiety disorder, unspecified: Secondary | ICD-10-CM

## 2011-10-12 MED ORDER — ALPRAZOLAM 0.25 MG PO TABS: 0.2500 mg | ORAL_TABLET | Freq: Every evening | ORAL | Status: DC | PRN

## 2011-10-12 NOTE — Telephone Encounter (Signed)
Rx called in to pharmacy. 

## 2011-11-15 ENCOUNTER — Other Ambulatory Visit: Payer: Self-pay | Admitting: Internal Medicine

## 2011-11-29 ENCOUNTER — Telehealth: Payer: Self-pay | Admitting: Family

## 2011-11-29 MED ORDER — ESOMEPRAZOLE MAGNESIUM 40 MG PO CPDR: 40.0000 mg | DELAYED_RELEASE_CAPSULE | Freq: Every day | ORAL | Status: DC

## 2011-11-29 NOTE — Telephone Encounter (Signed)
Refill- nexium dr 40mg  capsule. Take one capsule before breakfast. Qty 90 last fill 9.16.12

## 2011-11-29 NOTE — Telephone Encounter (Signed)
Pt last seen in May and advised f/u in November. Pt is past due for appt. 90 day supply sent to pharmacy. Please call pt and arrange f/u.

## 2011-11-29 NOTE — Telephone Encounter (Signed)
Left message for patient to return my call.

## 2011-12-01 NOTE — Telephone Encounter (Signed)
Left message for patient to return my call.

## 2011-12-15 ENCOUNTER — Other Ambulatory Visit: Payer: Self-pay | Admitting: Internal Medicine

## 2011-12-15 NOTE — Telephone Encounter (Signed)
Per office note in May 2012, pt was advised to try and wean off Trazadone and try Melatonin OTC and to call if insomnia worsened. Left message for pt to return my call. Need to verify if she tried Melatonin and if so, how did she do while on it? Left message on machine to return my call.

## 2011-12-21 ENCOUNTER — Ambulatory Visit (INDEPENDENT_AMBULATORY_CARE_PROVIDER_SITE_OTHER): Payer: PRIVATE HEALTH INSURANCE | Admitting: Gynecology

## 2011-12-21 ENCOUNTER — Other Ambulatory Visit: Payer: Self-pay | Admitting: Gynecology

## 2011-12-21 ENCOUNTER — Other Ambulatory Visit (HOSPITAL_COMMUNITY)
Admission: RE | Admit: 2011-12-21 | Discharge: 2011-12-21 | Disposition: A | Discharge status: Home / Self Care | Payer: PRIVATE HEALTH INSURANCE | Source: Ambulatory Visit | Attending: Gynecology | Admitting: Gynecology

## 2011-12-21 ENCOUNTER — Encounter: Payer: Self-pay | Admitting: Gynecology

## 2011-12-21 DIAGNOSIS — R300 Dysuria: Secondary | ICD-10-CM

## 2011-12-21 DIAGNOSIS — Z833 Family history of diabetes mellitus: Secondary | ICD-10-CM

## 2011-12-21 DIAGNOSIS — Z01419 Encounter for gynecological examination (general) (routine) without abnormal findings: Secondary | ICD-10-CM

## 2011-12-21 DIAGNOSIS — N952 Postmenopausal atrophic vaginitis: Secondary | ICD-10-CM

## 2011-12-21 DIAGNOSIS — Z1211 Encounter for screening for malignant neoplasm of colon: Secondary | ICD-10-CM

## 2011-12-21 DIAGNOSIS — F419 Anxiety disorder, unspecified: Secondary | ICD-10-CM

## 2011-12-21 DIAGNOSIS — Z780 Asymptomatic menopausal state: Secondary | ICD-10-CM

## 2011-12-21 DIAGNOSIS — Z1231 Encounter for screening mammogram for malignant neoplasm of breast: Secondary | ICD-10-CM

## 2011-12-21 LAB — URINALYSIS
Bilirubin Urine: NEGATIVE
Glucose, UA: NEGATIVE mg/dL
Hgb urine dipstick: NEGATIVE
Ketones, ur: NEGATIVE mg/dL
Nitrite: NEGATIVE
Protein, ur: NEGATIVE mg/dL
Specific Gravity, Urine: 1.02 (ref 1.005–1.030)
Urobilinogen, UA: 0.2 mg/dL (ref 0.0–1.0)
pH: 7 (ref 5.0–8.0)

## 2011-12-21 LAB — CBC WITH DIFFERENTIAL/PLATELET
Basophils Absolute: 0 10*3/uL (ref 0.0–0.1)
Basophils Relative: 0 % (ref 0–1)
Eosinophils Absolute: 0.5 10*3/uL (ref 0.0–0.7)
Eosinophils Relative: 8 % — ABNORMAL HIGH (ref 0–5)
HCT: 40.6 % (ref 36.0–46.0)
Hemoglobin: 13 g/dL (ref 12.0–15.0)
Lymphocytes Relative: 25 % (ref 12–46)
Lymphs Abs: 1.4 10*3/uL (ref 0.7–4.0)
MCH: 27.5 pg (ref 26.0–34.0)
MCHC: 32 g/dL (ref 30.0–36.0)
MCV: 86 fL (ref 78.0–100.0)
Monocytes Absolute: 0.5 10*3/uL (ref 0.1–1.0)
Monocytes Relative: 9 % (ref 3–12)
Neutro Abs: 3.3 10*3/uL (ref 1.7–7.7)
Neutrophils Relative %: 57 % (ref 43–77)
Platelets: 297 10*3/uL (ref 150–400)
RBC: 4.72 MIL/uL (ref 3.87–5.11)
RDW: 14.1 % (ref 11.5–15.5)
WBC: 5.7 10*3/uL (ref 4.0–10.5)

## 2011-12-21 LAB — GLUCOSE, RANDOM: Glucose, Bld: 96 mg/dL (ref 70–99)

## 2011-12-21 LAB — POC HEMOCCULT BLD/STL (OFFICE/1-CARD/DIAGNOSTIC): Fecal Occult Blood, POC: NEGATIVE

## 2011-12-21 LAB — CHOLESTEROL, TOTAL: Cholesterol: 201 mg/dL — ABNORMAL HIGH (ref 0–200)

## 2011-12-21 LAB — TSH: TSH: 1.14 u[IU]/mL (ref 0.350–4.500)

## 2011-12-21 MED ORDER — NYSTATIN-TRIAMCINOLONE 100000-0.1 UNIT/GM-% EX OINT: TOPICAL_OINTMENT | Freq: Two times a day (BID) | CUTANEOUS | Status: AC

## 2011-12-21 MED ORDER — ALPRAZOLAM 0.25 MG PO TABS: 0.2500 mg | ORAL_TABLET | Freq: Every evening | ORAL | Status: DC | PRN

## 2011-12-21 NOTE — Progress Notes (Signed)
Melissa Middleton May 14, 1956 [id number]   History:    56 y.o.  for annual exam with several complaints today. She's been followed by her osteopathic physician for neck pains. She states that she was informed she is bone spurs and possible fusion. He has done some manipulation on her neck and has helped her and she's doing exercises. Review of her records indicated she had a normal bone density study in 2011 although she is not taking her calcium and vitamin D as previously prescribed. She was recently treated for UTI and is asymptomatic today for that standpoint. Her mammogram was in December 2011 and she in frequently does her self breast examination. Last colonoscopy normal 2008. For anxiety she takes adnexa 0.25 mg half tablet when necessary. She has history of vaginal atrophy and 1 a prescription refill for estrogen vaginal cream.  Past medical history,surgical history, family history and social history were all reviewed and documented in the EPIC chart.  Gynecologic History No LMP recorded. Patient has had an ablation. Contraception: tubal ligation Last Pap: 2011 Results were: normal Last mammogram: 2011. Results were: normal  Obstetric History OB History    Grav Para Term Preterm Abortions TAB SAB Ect Mult Living   2 2 2       2      # Outc Date GA Lbr Len/2nd Wgt Sex Del Anes PTL Lv   1 TRM     F SVD  No Yes   2 TRM     M CS  No Yes       ROS:  Was performed and pertinent positives and negatives are included in the history.  Exam: chaperone present  BP 118/80  Ht 5\' 5"  (1.651 m)  Wt 188 lb (85.276 kg)  BMI 31.28 kg/m2  Body mass index is 31.28 kg/(m^2).  General appearance : Well developed well nourished female. No acute distress HEENT: Neck supple, trachea midline, no carotid bruits, no thyroidmegaly Lungs: Clear to auscultation, no rhonchi or wheezes, or rib retractions  Heart: Regular rate and rhythm, no murmurs or gallops Breast:Examined in sitting and supine position  were symmetrical in appearance, no palpable masses or tenderness,  no skin retraction, no nipple inversion, no nipple discharge, no skin discoloration, no axillary or supraclavicular lymphadenopathy Abdomen: no palpable masses or tenderness, no rebound or guarding Extremities: no edema or skin discoloration or tenderness  Pelvic:  Bartholin, Urethra, Skene Glands: Within normal limits             Vagina: No gross lesions or discharge  Cervix: No gross lesions or discharge  Uterus  anteverted, normal size, shape and consistency, non-tender and mobile  Adnexa  Without masses or tenderness  Anus and perineum  normal   Rectovaginal  normal sphincter tone without palpated masses or tenderness. Rawness             Hemoccult obtained results pending at time of this dictation     Assessment/Plan:  56 y.o. female for annual exam with strong family history of diabetes. Patient also with history of psoriasis. Prescription for Estrace vaginal cream to apply twice a week was prescribed. Risk benefits and pros and cons discussed. Patient instructed to take her calcium and vitamin D twice a day for osteoporosis prevention. She will need to schedule her mammogram. Her bone density study will be due next year. We'll be checking a CBC, random blood sugar, TSH, cholesterol, urinalysis and Pap smear today along with fecal occult blood testing were all results pending  at time of this dictation. She was instructed to engage in weightbearing exercises 45 minutes 3 or 4 times a week. Mycolog cream was prescribed to apply perirectal to the apparent yeast infection. Prescription refill present 0.25 mg to take a tablet when necessary was prescribed.    Ok Edwards MD, 12:15 PM 12/21/2011

## 2011-12-21 NOTE — Patient Instructions (Signed)
Remember to schedule your mammogram and do your monthly self breast exam. Also, you need to take caltrate plus or oscal or citracal or viactiv one twice a day for bone health as well as exercise 3 times a week for .

## 2011-12-23 ENCOUNTER — Other Ambulatory Visit: Payer: Self-pay | Admitting: *Deleted

## 2011-12-23 ENCOUNTER — Encounter: Payer: Self-pay | Admitting: *Deleted

## 2011-12-23 DIAGNOSIS — R6889 Other general symptoms and signs: Secondary | ICD-10-CM

## 2011-12-23 NOTE — Progress Notes (Signed)
Patient ID: Melissa Middleton, female   DOB: 12-24-1955, 56 y.o.   MRN: [id number] Pt called stating that her prescription for gen. Xanax 0.25 mg never got to pharmacy, [facility]

## 2011-12-23 NOTE — Telephone Encounter (Signed)
Spoke with pt and she states she had forgot to try and wean off of Trazadone and try melatonin in its place. Pt is a little concerned that Melatonin may not help but will try it until her f/u on 12/28/11 with  Sandford Craze, NP.

## 2011-12-28 ENCOUNTER — Ambulatory Visit: Payer: PRIVATE HEALTH INSURANCE | Admitting: Family

## 2011-12-28 ENCOUNTER — Other Ambulatory Visit (HOSPITAL_COMMUNITY)
Admission: RE | Admit: 2011-12-28 | Discharge: 2011-12-28 | Disposition: A | Discharge status: Home / Self Care | Payer: 59 | Source: Ambulatory Visit | Attending: Gynecology | Admitting: Gynecology

## 2011-12-28 ENCOUNTER — Ambulatory Visit (HOSPITAL_COMMUNITY)
Admission: RE | Admit: 2011-12-28 | Discharge: 2011-12-28 | Disposition: A | Discharge status: Home / Self Care | Payer: 59 | Source: Ambulatory Visit | Attending: Gynecology | Admitting: Gynecology

## 2011-12-28 ENCOUNTER — Ambulatory Visit (INDEPENDENT_AMBULATORY_CARE_PROVIDER_SITE_OTHER): Payer: PRIVATE HEALTH INSURANCE | Admitting: Gynecology

## 2011-12-28 DIAGNOSIS — R6889 Other general symptoms and signs: Secondary | ICD-10-CM

## 2011-12-28 DIAGNOSIS — Z01419 Encounter for gynecological examination (general) (routine) without abnormal findings: Secondary | ICD-10-CM | POA: Insufficient documentation

## 2011-12-28 DIAGNOSIS — N952 Postmenopausal atrophic vaginitis: Secondary | ICD-10-CM

## 2011-12-28 DIAGNOSIS — R87616 Satisfactory cervical smear but lacking transformation zone: Secondary | ICD-10-CM

## 2011-12-28 DIAGNOSIS — Z1231 Encounter for screening mammogram for malignant neoplasm of breast: Secondary | ICD-10-CM | POA: Insufficient documentation

## 2011-12-28 LAB — LIPID PANEL
Cholesterol: 182 mg/dL (ref 0–200)
HDL: 52 mg/dL (ref >39)
LDL Cholesterol: 107 mg/dL — ABNORMAL HIGH (ref 0–99)
Total CHOL/HDL Ratio: 3.5 Ratio
Triglycerides: 115 mg/dL (ref <150)
VLDL: 23 mg/dL (ref 0–40)

## 2011-12-28 MED ORDER — ESTRADIOL 0.1 MG/GM VA CREA: 1.0000 g | TOPICAL_CREAM | VAGINAL | Status: DC

## 2011-12-28 NOTE — Progress Notes (Signed)
Patient is a 56 year old was seen the office on January 2 for her annual exam. Her lab work indicated a total cholesterol 201 and she returned today is instructed for fasting lipid profile also her CBC was repeated due to the fact that her eosinophil was slightly elevated at 8 normal being 0-5%. Also she was here for repeat Pap smear due to the fact that her Pap smear was normal but no endocervical cells were present the Pap smear was repeated today with a vigorous Cytobrush and submitted for cytological evaluation. Recent blood sugar TSH were normal and her fecal occult blood testing was negative. Because of her vaginal atrophy she had been prescribed Estrace vaginal cream to apply to 3 times a week. Risk benefits and pros and cons of been discussed all questions are answered and we'll follow accordingly. We'll see her back in one year or when necessary.

## 2011-12-28 NOTE — Patient Instructions (Signed)
Apply the Estrace vaginal cream three times a week for the vaginal dryness and to prevent infections. Have a good year!

## 2011-12-29 LAB — CBC WITH DIFFERENTIAL/PLATELET
Basophils Absolute: 0 10*3/uL (ref 0.0–0.1)
Basophils Relative: 0 % (ref 0–1)
Eosinophils Absolute: 0.5 10*3/uL (ref 0.0–0.7)
Eosinophils Relative: 8 % — ABNORMAL HIGH (ref 0–5)
HCT: 42.5 % (ref 36.0–46.0)
Hemoglobin: 13 g/dL (ref 12.0–15.0)
Lymphocytes Relative: 27 % (ref 12–46)
Lymphs Abs: 1.6 10*3/uL (ref 0.7–4.0)
MCH: 27.5 pg (ref 26.0–34.0)
MCHC: 30.6 g/dL (ref 30.0–36.0)
MCV: 89.9 fL (ref 78.0–100.0)
Monocytes Absolute: 0.4 10*3/uL (ref 0.1–1.0)
Monocytes Relative: 7 % (ref 3–12)
Neutro Abs: 3.5 10*3/uL (ref 1.7–7.7)
Neutrophils Relative %: 58 % (ref 43–77)
Platelets: 291 10*3/uL (ref 150–400)
RBC: 4.73 MIL/uL (ref 3.87–5.11)
RDW: 14.7 % (ref 11.5–15.5)
WBC: 6 10*3/uL (ref 4.0–10.5)

## 2012-01-04 ENCOUNTER — Ambulatory Visit: Payer: PRIVATE HEALTH INSURANCE | Admitting: Family

## 2012-01-11 ENCOUNTER — Encounter: Payer: Self-pay | Admitting: Family

## 2012-01-11 ENCOUNTER — Ambulatory Visit (INDEPENDENT_AMBULATORY_CARE_PROVIDER_SITE_OTHER): Payer: PRIVATE HEALTH INSURANCE | Admitting: Family

## 2012-01-11 VITALS — BP 122/82 | HR 83 | Temp 97.9°F | Resp 16 | Wt 186.0 lb

## 2012-01-11 DIAGNOSIS — F419 Anxiety disorder, unspecified: Secondary | ICD-10-CM

## 2012-01-11 DIAGNOSIS — M25561 Pain in right knee: Secondary | ICD-10-CM

## 2012-01-11 DIAGNOSIS — G4700 Insomnia, unspecified: Secondary | ICD-10-CM

## 2012-01-11 DIAGNOSIS — M25569 Pain in unspecified knee: Secondary | ICD-10-CM

## 2012-01-11 DIAGNOSIS — F411 Generalized anxiety disorder: Secondary | ICD-10-CM

## 2012-01-11 MED ORDER — MONTELUKAST SODIUM 10 MG PO TABS: 10.0000 mg | ORAL_TABLET | Freq: Every day | ORAL | Status: DC

## 2012-01-11 MED ORDER — ZOLPIDEM TARTRATE 5 MG PO TABS: 5.0000 mg | ORAL_TABLET | Freq: Every evening | ORAL | Status: DC | PRN

## 2012-01-11 MED ORDER — ESOMEPRAZOLE MAGNESIUM 40 MG PO CPDR: 40.0000 mg | DELAYED_RELEASE_CAPSULE | Freq: Every day | ORAL | Status: DC

## 2012-01-11 MED ORDER — TRAZODONE HCL 100 MG PO TABS: 100.0000 mg | ORAL_TABLET | Freq: Every day | ORAL | Status: DC

## 2012-01-11 NOTE — Assessment & Plan Note (Signed)
Will try weaning pt off of trazodone and try Ambien instead.  I also recommended that she try using tylenol at bedtime to help with knee pain and hopefully keep her more comfortable through the night so that she can sleep better.

## 2012-01-11 NOTE — Patient Instructions (Addendum)
Decrease trazadone to 50mg  at bedtime for 1 week, then 25mg  at bedtime for 1 week, then stop.   Start ambien when you are down to the 25mg  dose of trazodone.  Please follow up in 3 months.

## 2012-01-11 NOTE — Assessment & Plan Note (Signed)
Pt is requesting referral to Dr. Despina Hick for second opinion.

## 2012-01-11 NOTE — Progress Notes (Signed)
Subjective:    Patient ID: Melissa Middleton, female    DOB: 1956/12/16, 56 y.o.   MRN: [id number]  HPI  Melissa Middleton is a 56 yr old female who presents today with several concerns:  Insomnia- reports that she started trazadone HS for sleep.  She notes that she is waking up in the middle of the night with joint pain in her right knee and some anxiety. She has been using melatonin for 1 week with some improvement.    Anxiety- She denies current depression symptoms. She feel "high strung". Reports remote hx of panic attacks.  She uses xanax PRN- rarely. Does not feel that her anxiety is interfering much with her quality of life at this point.   R knee pain- she reports hx of R TKR in 2010, which "hasn't worked out that good." She complains of pain in the right knee- sometimes bothers her at night and makes it hard to sleep.  Review of Systems    see HPI  Past Medical History  Diagnosis Date  . Premenstrual tension syndromes     gyn uses alprazolam and spironolactone prn ofr treatment  . Benign neoplasm of stomach     gastric polyps  . Stricture and stenosis of esophagus   . Diaphragmatic hernia without mention of obstruction or gangrene   . Diverticulosis of colon (without mention of hemorrhage)   . Unspecified asthma   . Esophageal reflux     see GI  . Bronchitis     recurrent  . Allergic rhinitis   . Sinusitis     recurrent  . Osteoarthritis     sees ortho-has had steroid shot in knee  . Post menopausal problems     symptoms  . Insomnia   . Anxiety   . Psoriasis     History   Social History  . Marital Status: Married    Spouse Name: N/A    Number of Children: 2  . Years of Education: N/A   Occupational History  .      Administrative work   Social History Main Topics  . Smoking status: Never Smoker   . Smokeless tobacco: Never Used  . Alcohol Use: No  . Drug Use: No  . Sexually Active: Yes   Other Topics Concern  . Not on file   Social History Narrative    . No narrative on file    Past Surgical History  Procedure Date  . Tubal ligation 2004    with ablation  . Nasal sinus surgery   . Total knee arthroplasty 11/2009    Right knee  . Cesarean section   . Endometrial ablation     Family History  Problem Relation Age of Onset  . Arthritis Other   . Hypertension Mother   . Diabetes Father   . Hypertension Father   . Cancer Sister     MELANOMA  . Hypertension Maternal Grandfather   . Diabetes Paternal Grandmother   . Migraines Neg Hx     Allergies  Allergen Reactions  . Cefuroxime Axetil     REACTION: red face/rash  . Penicillins     REACTION: rash    Current Outpatient Prescriptions on File Prior to Visit  Medication Sig Dispense Refill  . albuterol (VENTOLIN HFA) 108 (90 BASE) MCG/ACT inhaler Inhale 2 puffs into the lungs every 6 (six) hours as needed. For wheezing       . ALPRAZolam (XANAX) 0.25 MG tablet Take 1 tablet (0.25 mg  total) by mouth at bedtime as needed for sleep. 1/2 - 1 tablet as needed  30 tablet  5  . cetirizine (ZYRTEC) 10 MG tablet Take 10 mg by mouth daily.        . clotrimazole-betamethasone (LOTRISONE) cream Apply topically 2 (two) times daily.        Marland Kitchen estradiol (ESTRACE) 0.1 MG/GM vaginal cream Place 1 g vaginally 3 (three) times a week.  42.5 g  10  . fluticasone (FLONASE) 50 MCG/ACT nasal spray 2 sprays each nostril once daily       . nystatin-triamcinolone ointment (MYCOLOG) Apply topically 2 (two) times daily.  30 g  1  . spironolactone (ALDACTONE) 25 MG tablet Take 25 mg by mouth daily as needed.          BP 122/82  Pulse 83  Temp(Src) 97.9 F (36.6 C) (Oral)  Resp 16  Wt 186 lb (84.369 kg)  SpO2 95%    Objective:   Physical Exam  Constitutional: She appears well-developed and well-nourished. No distress.  Cardiovascular: Normal rate and regular rhythm.   No murmur heard. Pulmonary/Chest: Effort normal and breath sounds normal. No respiratory distress. She has no wheezes. She has  no rales. She exhibits no tenderness.  Musculoskeletal: She exhibits no edema.  Skin: Skin is warm and dry.  Psychiatric: She has a normal mood and affect. Her behavior is normal. Judgment and thought content normal.          Assessment & Plan:

## 2012-01-11 NOTE — Assessment & Plan Note (Signed)
She describes the symptoms as mild at this time.  Continue PRN alprazolam for now- monitor.

## 2012-01-18 ENCOUNTER — Ambulatory Visit (HOSPITAL_COMMUNITY): Payer: PRIVATE HEALTH INSURANCE

## 2012-01-23 ENCOUNTER — Ambulatory Visit (INDEPENDENT_AMBULATORY_CARE_PROVIDER_SITE_OTHER): Payer: PRIVATE HEALTH INSURANCE | Admitting: Family

## 2012-01-23 ENCOUNTER — Encounter: Payer: Self-pay | Admitting: Family

## 2012-01-23 DIAGNOSIS — J329 Chronic sinusitis, unspecified: Secondary | ICD-10-CM

## 2012-01-23 MED ORDER — ALBUTEROL SULFATE HFA 108 (90 BASE) MCG/ACT IN AERS: 2.0000 | INHALATION_SPRAY | Freq: Four times a day (QID) | RESPIRATORY_TRACT | Status: DC | PRN

## 2012-01-23 MED ORDER — LEVOFLOXACIN 500 MG PO TABS: 500.0000 mg | ORAL_TABLET | Freq: Every day | ORAL | Status: AC

## 2012-01-23 NOTE — Patient Instructions (Addendum)
Please call if your symptoms worsen or if no improvement.   Sinusitis Sinuses are air pockets within the bones of your face. The growth of bacteria within a sinus leads to infection. The infection prevents the sinuses from draining. This infection is called sinusitis. SYMPTOMS  There will be different areas of pain depending on which sinuses have become infected.  The maxillary sinuses often produce pain beneath the eyes.   Frontal sinusitis may cause pain in the middle of the forehead and above the eyes.  Other problems (symptoms) include:  Toothaches.   Colored, pus-like (purulent) drainage from the nose.   Swelling, warmth, and tenderness over the sinus areas may be signs of infection.  TREATMENT  Sinusitis is most often determined by an exam.X-rays may be taken. If x-rays have been taken, make sure you obtain your results or find out how you are to obtain them. Your caregiver may give you medications (antibiotics). These are medications that will help kill the bacteria causing the infection. You may also be given a medication (decongestant) that helps to reduce sinus swelling.  HOME CARE INSTRUCTIONS   Only take over-the-counter or prescription medicines for pain, discomfort, or fever as directed by your caregiver.   Drink extra fluids. Fluids help thin the mucus so your sinuses can drain more easily.   Applying either moist heat or ice packs to the sinus areas may help relieve discomfort.   Use saline nasal sprays to help moisten your sinuses. The sprays can be found at your local drugstore.  SEEK IMMEDIATE MEDICAL CARE IF:  You have a fever.   You have increasing pain, severe headaches, or toothache.   You have nausea, vomiting, or drowsiness.   You develop unusual swelling around the face or trouble seeing.  MAKE SURE YOU:   Understand these instructions.   Will watch your condition.   Will get help right away if you are not doing well or get worse.  Document  Released: 12/05/2005 Document Revised: 08/17/2011 Document Reviewed: 07/04/2007 [facility] Patient Information 2012 [place], [place].

## 2012-01-23 NOTE — Progress Notes (Signed)
Subjective:    Patient ID: Melissa Middleton, female    DOB: 1956-05-15, 56 y.o.   MRN: [id number]  HPI  Melissa Middleton is a 56 yr old female who presents today with chief complaint of sinus pain pressure x 1 week.  Has associated HS cough.   Initially symptoms started with cough, congestion about 2 weeks ago.  Notes associated chills and subjective low grade fever.  Using sinus rinse without significant improvement.  Nasal congestion is light green in color.  She has continued zyrtec     Review of Systems See HPI  Past Medical History  Diagnosis Date  . Premenstrual tension syndromes     gyn uses alprazolam and spironolactone prn ofr treatment  . Benign neoplasm of stomach     gastric polyps  . Stricture and stenosis of esophagus   . Diaphragmatic hernia without mention of obstruction or gangrene   . Diverticulosis of colon (without mention of hemorrhage)   . Unspecified asthma   . Esophageal reflux     see GI  . Bronchitis     recurrent  . Allergic rhinitis   . Sinusitis     recurrent  . Osteoarthritis     sees ortho-has had steroid shot in knee  . Post menopausal problems     symptoms  . Insomnia   . Anxiety   . Psoriasis     History   Social History  . Marital Status: Married    Spouse Name: N/A    Number of Children: 2  . Years of Education: N/A   Occupational History  .      Administrative work   Social History Main Topics  . Smoking status: Never Smoker   . Smokeless tobacco: Never Used  . Alcohol Use: No  . Drug Use: No  . Sexually Active: Yes   Other Topics Concern  . Not on file   Social History Narrative  . No narrative on file    Past Surgical History  Procedure Date  . Tubal ligation 2004    with ablation  . Nasal sinus surgery   . Total knee arthroplasty 11/2009    Right knee  . Cesarean section   . Endometrial ablation     Family History  Problem Relation Age of Onset  . Arthritis Other   . Hypertension Mother   . Diabetes  Father   . Hypertension Father   . Cancer Sister     MELANOMA  . Hypertension Maternal Grandfather   . Diabetes Paternal Grandmother   . Migraines Neg Hx     Allergies  Allergen Reactions  . Cefuroxime Axetil     REACTION: red face/rash  . Penicillins     REACTION: rash    Current Outpatient Prescriptions on File Prior to Visit  Medication Sig Dispense Refill  . ALPRAZolam (XANAX) 0.25 MG tablet Take 1 tablet (0.25 mg total) by mouth at bedtime as needed for sleep. 1/2 - 1 tablet as needed  30 tablet  5  . cetirizine (ZYRTEC) 10 MG tablet Take 10 mg by mouth daily.        . clotrimazole-betamethasone (LOTRISONE) cream Apply topically 2 (two) times daily.        Marland Kitchen esomeprazole (NEXIUM) 40 MG capsule Take 1 capsule (40 mg total) by mouth daily before breakfast.  90 capsule  1  . estradiol (ESTRACE) 0.1 MG/GM vaginal cream Place 1 g vaginally 3 (three) times a week.  42.5 g  10  .  fluticasone (FLONASE) 50 MCG/ACT nasal spray 2 sprays each nostril once daily       . montelukast (SINGULAIR) 10 MG tablet Take 1 tablet (10 mg total) by mouth at bedtime.  90 tablet  1  . nystatin-triamcinolone ointment (MYCOLOG) Apply topically 2 (two) times daily.  30 g  1  . spironolactone (ALDACTONE) 25 MG tablet Take 25 mg by mouth daily as needed.        . traZODone (DESYREL) 100 MG tablet Take 1 tablet (100 mg total) by mouth at bedtime.  30 tablet  0  . zolpidem (AMBIEN) 5 MG tablet Take 1 tablet (5 mg total) by mouth at bedtime as needed for sleep.  30 tablet  0    BP 100/70  Pulse 81  Temp(Src) 97.8 F (36.6 C) (Oral)  Resp 16  Wt 185 lb 0.6 oz (83.934 kg)  SpO2 95%       Objective:   Physical Exam  Constitutional: She appears well-developed and well-nourished. No distress.  HENT:  Head: Normocephalic and atraumatic.       + frontal sinus tenderness to palpation. Bilateral TM's dull.  Eyes: Conjunctivae are normal. Pupils are equal, round, and reactive to light.  Cardiovascular:  Normal rate and regular rhythm.   No murmur heard. Pulmonary/Chest: Effort normal and breath sounds normal. No respiratory distress. She has no wheezes. She has no rales. She exhibits no tenderness.  Lymphadenopathy:    She has no cervical adenopathy.  Psychiatric: She has a normal mood and affect. Her behavior is normal. Judgment and thought content normal.          Assessment & Plan:

## 2012-01-23 NOTE — Assessment & Plan Note (Signed)
Pt allergic to pen/cephalosporins.  Will rx with levaquin.  She is instructed to call if symptoms worsen or if no improvement.

## 2012-01-25 ENCOUNTER — Telehealth: Payer: Self-pay | Admitting: Family

## 2012-01-25 NOTE — Telephone Encounter (Signed)
Patient has called back regarding the status of this.

## 2012-01-25 NOTE — Telephone Encounter (Signed)
Notified pt. She states she "is not going to return to see [place] as she knows what medications work for her and she did not feel the Levaquin was going to help when it was prescribed." I advised pt that Melissa needed to re-evaluate her before changing her medication. Pt states she "is not going to drive all the way back up here today, she will see her ENT or go to urgent care closer to her home." Pt states she is not happy with our office culture and will be coming by to get her records.

## 2012-01-25 NOTE — Telephone Encounter (Signed)
The levoquin is not working for her sinus infection.  She now has an ear ache as well.  She would like to change to biaxin 500mg  two times a day.  [facility].  She has been out of work 3 days and really needs to start this new med asap as she needs to go to work tomorrow.

## 2012-01-25 NOTE — Telephone Encounter (Signed)
I would like to see her back in the office please.

## 2012-03-07 ENCOUNTER — Ambulatory Visit (INDEPENDENT_AMBULATORY_CARE_PROVIDER_SITE_OTHER): Payer: PRIVATE HEALTH INSURANCE | Admitting: Internal Medicine

## 2012-03-07 ENCOUNTER — Encounter: Payer: Self-pay | Admitting: Internal Medicine

## 2012-03-07 VITALS — BP 100/80 | HR 90 | Temp 97.9°F | Resp 18 | Wt 184.0 lb

## 2012-03-07 DIAGNOSIS — J40 Bronchitis, not specified as acute or chronic: Secondary | ICD-10-CM

## 2012-03-07 DIAGNOSIS — B029 Zoster without complications: Secondary | ICD-10-CM | POA: Insufficient documentation

## 2012-03-07 MED ORDER — CLARITHROMYCIN 500 MG PO TABS: 500.0000 mg | ORAL_TABLET | Freq: Two times a day (BID) | ORAL | Status: AC

## 2012-03-07 NOTE — Assessment & Plan Note (Signed)
Attempt biaxin. Use albuterol mdi prn. Followup if no improvement or worsening.

## 2012-03-07 NOTE — Progress Notes (Signed)
  Subjective:    Patient ID: Melissa Middleton, female    DOB: 11/15/1956, 56 y.o.   MRN: [id number]  HPI Pt presents to clinic for evaluation of cough. Notes 6+d h/o cough productive for green sputum without hemoptysis. Notes intermittent wheezing without fever or chills. No alleviating or exacerbating factors. Reports episode of right facial shingles last month evaluated by ENT and optho. No visual changes. S/p ?acyclovir and prednisone with improvement. No pain but has improving dizziness after ?right ear involvement. Wishes to pursue zostavax.  Past Medical History  Diagnosis Date  . Premenstrual tension syndromes     gyn uses alprazolam and spironolactone prn ofr treatment  . Benign neoplasm of stomach     gastric polyps  . Stricture and stenosis of esophagus   . Diaphragmatic hernia without mention of obstruction or gangrene   . Diverticulosis of colon (without mention of hemorrhage)   . Unspecified asthma   . Esophageal reflux     see GI  . Bronchitis     recurrent  . Allergic rhinitis   . Sinusitis     recurrent  . Osteoarthritis     sees ortho-has had steroid shot in knee  . Post menopausal problems     symptoms  . Insomnia   . Anxiety   . Psoriasis    Past Surgical History  Procedure Date  . Tubal ligation 2004    with ablation  . Nasal sinus surgery   . Total knee arthroplasty 11/2009    Right knee  . Cesarean section   . Endometrial ablation     reports that she has never smoked. She has never used smokeless tobacco. She reports that she does not drink alcohol or use illicit drugs. family history includes Arthritis in her other; Cancer in her sister; Diabetes in her father and paternal grandmother; and Hypertension in her father, maternal grandfather, and mother.  There is no history of Migraines. Allergies  Allergen Reactions  . Cefuroxime Axetil     REACTION: red face/rash  . Penicillins     REACTION: rash     Review of Systems see hpi     Objective:     Physical Exam  Nursing note and vitals reviewed. Constitutional: She appears well-developed and well-nourished.  HENT:  Head: Normocephalic and atraumatic.  Right Ear: External ear normal.  Left Ear: External ear normal.  Nose: Nose normal.  Mouth/Throat: Oropharynx is clear and moist. No oropharyngeal exudate.  Eyes: Conjunctivae are normal. Pupils are equal, round, and reactive to light. Right eye exhibits no discharge. Left eye exhibits no discharge. No scleral icterus.  Neck: Neck supple.  Pulmonary/Chest: Effort normal and breath sounds normal. No respiratory distress. She has no wheezes. She has no rales.  Lymphadenopathy:    She has no cervical adenopathy.  Neurological: She is alert.  Skin: Skin is warm and dry. No rash noted.  Psychiatric: She has a normal mood and affect.          Assessment & Plan:

## 2012-03-07 NOTE — Assessment & Plan Note (Signed)
Understands zostavax studied in 60+yo population. Wishes to obtain. Prescription written

## 2012-03-26 ENCOUNTER — Encounter: Payer: Self-pay | Admitting: Internal Medicine

## 2012-03-26 ENCOUNTER — Ambulatory Visit (INDEPENDENT_AMBULATORY_CARE_PROVIDER_SITE_OTHER): Payer: PRIVATE HEALTH INSURANCE | Admitting: Internal Medicine

## 2012-03-26 VITALS — BP 118/80 | HR 81 | Temp 98.1°F | Resp 18 | Wt 181.0 lb

## 2012-03-26 DIAGNOSIS — G4700 Insomnia, unspecified: Secondary | ICD-10-CM

## 2012-03-26 DIAGNOSIS — R0989 Other specified symptoms and signs involving the circulatory and respiratory systems: Secondary | ICD-10-CM

## 2012-03-26 DIAGNOSIS — R0600 Dyspnea, unspecified: Secondary | ICD-10-CM | POA: Insufficient documentation

## 2012-03-26 DIAGNOSIS — R0609 Other forms of dyspnea: Secondary | ICD-10-CM

## 2012-03-26 MED ORDER — CLARITHROMYCIN 500 MG PO TABS: 500.0000 mg | ORAL_TABLET | Freq: Two times a day (BID) | ORAL | Status: AC

## 2012-03-26 MED ORDER — ZOLPIDEM TARTRATE 5 MG PO TABS: 5.0000 mg | ORAL_TABLET | Freq: Every evening | ORAL | Status: DC | PRN

## 2012-03-26 MED ORDER — ALBUTEROL SULFATE HFA 108 (90 BASE) MCG/ACT IN AERS: 2.0000 | INHALATION_SPRAY | Freq: Four times a day (QID) | RESPIRATORY_TRACT | Status: DC | PRN

## 2012-03-26 NOTE — Progress Notes (Signed)
  Subjective:    Patient ID: Melissa Middleton, female    DOB: December 04, 1956, 56 y.o.   MRN: [id number]  HPI Pt presents to clinic for evaluation of dyspnea and abnormal cxr. Treated 3/20 with course of biaxin with improvement. Seen at outside [facility] yesterday after intermittent dyspnea worsened. Denies f/c or wheezing. Outside cxr report reviewed demonstrating possible hyperinflation without infiltrate or mass. No known h/o asthma or copd and is a nonsmoker. Was given medrol dosepak and began late yesterday. No other alleviating or exacerbating factors. States has scheduled appointment with allergy in the near future. Notes chronic insomnia helped by [place] without side effects.   Past Medical History  Diagnosis Date  . Premenstrual tension syndromes     gyn uses alprazolam and spironolactone prn ofr treatment  . Benign neoplasm of stomach     gastric polyps  . Stricture and stenosis of esophagus   . Diaphragmatic hernia without mention of obstruction or gangrene   . Diverticulosis of colon (without mention of hemorrhage)   . Unspecified asthma   . Esophageal reflux     see GI  . Bronchitis     recurrent  . Allergic rhinitis   . Sinusitis     recurrent  . Osteoarthritis     sees ortho-has had steroid shot in knee  . Post menopausal problems     symptoms  . Insomnia   . Anxiety   . Psoriasis    Past Surgical History  Procedure Date  . Tubal ligation 2004    with ablation  . Nasal sinus surgery   . Total knee arthroplasty 11/2009    Right knee  . Cesarean section   . Endometrial ablation     reports that she has never smoked. She has never used smokeless tobacco. She reports that she does not drink alcohol or use illicit drugs. family history includes Arthritis in her other; Cancer in her sister; Diabetes in her father and paternal grandmother; and Hypertension in her father, maternal grandfather, and mother.  There is no history of Migraines. Allergies  Allergen Reactions  .  Cefuroxime Axetil     REACTION: red face/rash  . Penicillins     REACTION: rash     Review of Systems see hpi     Objective:   Physical Exam  Nursing note and vitals reviewed. Constitutional: She appears well-developed and well-nourished. No distress.  HENT:  Head: Normocephalic.  Right Ear: External ear normal.  Left Ear: External ear normal.  Eyes: Conjunctivae are normal. No scleral icterus.  Pulmonary/Chest: Effort normal and breath sounds normal. No respiratory distress. She has no wheezes. She has no rales.  Neurological: She is alert.  Skin: She is not diaphoretic.  Psychiatric: She has a normal mood and affect.          Assessment & Plan:

## 2012-03-26 NOTE — Assessment & Plan Note (Signed)
Stable. rf [employer]

## 2012-03-26 NOTE — Assessment & Plan Note (Signed)
Currently asx. Discussed possible pft and pt now with allergist appt soon. Add biaxin to medrol dosepak. Proceed with pft if allergy does not pursue. Pt agrees to contact clinic

## 2012-03-30 ENCOUNTER — Encounter: Payer: Self-pay | Admitting: Internal Medicine

## 2012-03-30 ENCOUNTER — Ambulatory Visit (INDEPENDENT_AMBULATORY_CARE_PROVIDER_SITE_OTHER): Payer: PRIVATE HEALTH INSURANCE | Admitting: Internal Medicine

## 2012-03-30 VITALS — BP 118/80 | HR 71 | Temp 98.1°F | Resp 18

## 2012-03-30 DIAGNOSIS — J45909 Unspecified asthma, uncomplicated: Secondary | ICD-10-CM

## 2012-03-30 NOTE — Progress Notes (Signed)
  Subjective:    Patient ID: Melissa Middleton, female    DOB: 10/02/1956, 56 y.o.   MRN: [id number]  HPI Pt presents to clinic for follow up of cough. Notes persistent cough and intermittent wheezing. Has allergy appointment pending. Has finished medrol dosepak and is taking biaxin. Believes was given 250mg  of biaxin instead of 500mg . Cough is intermittently productive for clear sputum. Recent cxr suggested hyperinflation without infiltrate. No other alleviating or exacerbating factors.   Past Medical History  Diagnosis Date  . Premenstrual tension syndromes     gyn uses alprazolam and spironolactone prn ofr treatment  . Benign neoplasm of stomach     gastric polyps  . Stricture and stenosis of esophagus   . Diaphragmatic hernia without mention of obstruction or gangrene   . Diverticulosis of colon (without mention of hemorrhage)   . Unspecified asthma   . Esophageal reflux     see GI  . Bronchitis     recurrent  . Allergic rhinitis   . Sinusitis     recurrent  . Osteoarthritis     sees ortho-has had steroid shot in knee  . Post menopausal problems     symptoms  . Insomnia   . Anxiety   . Psoriasis    Past Surgical History  Procedure Date  . Tubal ligation 2004    with ablation  . Nasal sinus surgery   . Total knee arthroplasty 11/2009    Right knee  . Cesarean section   . Endometrial ablation     reports that she has never smoked. She has never used smokeless tobacco. She reports that she does not drink alcohol or use illicit drugs. family history includes Arthritis in her other; Cancer in her sister; Diabetes in her father and paternal grandmother; and Hypertension in her father, maternal grandfather, and mother.  There is no history of Migraines. Allergies  Allergen Reactions  . Cefuroxime Axetil     REACTION: red face/rash  . Penicillins     REACTION: rash     Review of Systems see hpi     Objective:   Physical Exam  Nursing note and vitals  reviewed. Constitutional: She appears well-developed and well-nourished. No distress.  HENT:  Head: Normocephalic and atraumatic.  Right Ear: External ear normal.  Left Ear: External ear normal.  Eyes: Conjunctivae are normal. No scleral icterus.  Pulmonary/Chest: Effort normal and breath sounds normal. No respiratory distress. She has no wheezes. She has no rales.  Neurological: She is alert.  Skin: Skin is warm and dry. She is not diaphoretic.  Psychiatric: She has a normal mood and affect.          Assessment & Plan:

## 2012-03-30 NOTE — Assessment & Plan Note (Signed)
Pt contacted pharmacy and was apparently given biaxin 250mg  2 po bid but has been taking 1 bid. Change today to correct dosing. Stop qvar and begin sample of advair one puff bid and rinse mouth after use. Continue albuterol prn. Followup if no improvement or worsening.

## 2012-05-15 ENCOUNTER — Telehealth: Payer: Self-pay | Admitting: Internal Medicine

## 2012-05-15 NOTE — Telephone Encounter (Signed)
She got an rx for [employer].  She has used it every night and it has helped with sleep.  She has noticed the last few days that she feel depressed and gloomy.  She has not taken the [place] for three days.  Please call her

## 2012-05-15 NOTE — Telephone Encounter (Signed)
Attempted to reach pt, left message to return my call. 

## 2012-05-16 NOTE — Telephone Encounter (Signed)
Pt states she has noticed feelings of gloom and doom x 2 months while taking ambien. Has just felt uneasy.  Has stopped Ambien and has been taking melatonin without relief of sleep. States she taken Trazadone in the past. Wants to know if Ambien could be causing depression or was her depression helped by the Trazadone in the past? Wants to know if she should go back on Trazadone.  If so, pt requests new rx.  Pt aware Provider will not be in office until tomorrow and ok to wait for response. Please advise.

## 2012-05-17 ENCOUNTER — Telehealth: Payer: Self-pay | Admitting: Internal Medicine

## 2012-05-17 MED ORDER — TRAZODONE HCL 100 MG PO TABS: 100.0000 mg | ORAL_TABLET | Freq: Every evening | ORAL | Status: DC | PRN

## 2012-05-17 MED ORDER — MONTELUKAST SODIUM 10 MG PO TABS: 10.0000 mg | ORAL_TABLET | Freq: Every day | ORAL | Status: DC

## 2012-05-17 NOTE — Telephone Encounter (Signed)
Stay off ambien. Resume trazadone at previous dose. rf6

## 2012-05-17 NOTE — Telephone Encounter (Signed)
trazadone in theory yes but i think it's so mild its hard to tell sometimes. Remus Loffler can absolutely make a minority of people feel odd. Ok #90 rf 2

## 2012-05-17 NOTE — Telephone Encounter (Signed)
Pt notified and refill left on pharmacy voicemail with request to deactivate pt's Remus Loffler Rx as she will not be taking it any longer and to call if any questions.

## 2012-05-17 NOTE — Telephone Encounter (Signed)
Rx refill sent to pharmacy. 

## 2012-05-17 NOTE — Telephone Encounter (Signed)
Pt left message wanting to know if trazodone has antidepressant qualities and if you think that it why she did not notice depression while on it? Does Ambien have antidepressant qualities or were her symptoms a side effect of ambien? Also wants to know if she can get a 90 day supply at a time? Please advise.

## 2012-05-17 NOTE — Telephone Encounter (Signed)
Call returned to patient at [phone], she was informed per Dr Rodena Medin instructions and has verbalized understanding.

## 2012-05-17 NOTE — Telephone Encounter (Signed)
Refill- montelukast sod 10mg  tablet. Take one tablet at bedtime. Qty 90 last fill 2.23.13

## 2012-06-11 ENCOUNTER — Telehealth: Payer: Self-pay | Admitting: Internal Medicine

## 2012-06-11 MED ORDER — TRAZODONE HCL 100 MG PO TABS: 100.0000 mg | ORAL_TABLET | Freq: Every evening | ORAL | Status: DC | PRN

## 2012-06-11 NOTE — Telephone Encounter (Signed)
Rx sent to pharmacy   

## 2012-06-11 NOTE — Telephone Encounter (Signed)
Refill- trazodone 100mg  tablet. Qty 90

## 2012-06-27 ENCOUNTER — Telehealth: Payer: Self-pay | Admitting: *Deleted

## 2012-06-27 NOTE — Telephone Encounter (Signed)
Pt called c/o uti burning, frequent urination, pt requesting rx, pt informed [facility]. Pt said she didn't want to "drive all the way over here" she will go urgent care which is much closer.

## 2012-07-02 ENCOUNTER — Other Ambulatory Visit: Payer: Self-pay | Admitting: *Deleted

## 2012-07-02 DIAGNOSIS — F419 Anxiety disorder, unspecified: Secondary | ICD-10-CM

## 2012-07-02 MED ORDER — ALPRAZOLAM 0.25 MG PO TABS: 0.2500 mg | ORAL_TABLET | Freq: Every evening | ORAL | Status: DC | PRN

## 2012-07-02 NOTE — Telephone Encounter (Signed)
Last refill date 05/28/12

## 2012-07-27 ENCOUNTER — Telehealth: Payer: Self-pay | Admitting: Internal Medicine

## 2012-07-27 MED ORDER — ESOMEPRAZOLE MAGNESIUM 40 MG PO CPDR: 40.0000 mg | DELAYED_RELEASE_CAPSULE | Freq: Every day | ORAL | Status: DC

## 2012-07-27 NOTE — Telephone Encounter (Signed)
Refill- nexium dr 40mg  capsule. Take one capsule every day before breakfast. Qty 90 last fill 5.15.13

## 2012-07-27 NOTE — Telephone Encounter (Signed)
Rx done/SLS 

## 2012-11-02 ENCOUNTER — Telehealth: Payer: Self-pay | Admitting: Internal Medicine

## 2012-11-02 MED ORDER — MONTELUKAST SODIUM 10 MG PO TABS: 10.0000 mg | ORAL_TABLET | Freq: Every day | ORAL | Status: DC

## 2012-11-02 NOTE — Telephone Encounter (Signed)
Rx to pharmacy/SLS 

## 2012-11-02 NOTE — Telephone Encounter (Signed)
Refill- montelukast sod 10mg  tablet. Take one tablet at bedtime. Qty 90 last fill 8.20.13

## 2013-01-06 ENCOUNTER — Other Ambulatory Visit: Payer: Self-pay | Admitting: Internal Medicine

## 2013-01-07 NOTE — Telephone Encounter (Signed)
Trazodone request [last Rx 06.24.13 #90x1-Last OV 04.12.13]/SLS Please advise/SLS

## 2013-01-07 NOTE — Telephone Encounter (Signed)
I'm only sending in quantity 90 and no refills. If I do same number and 2 refills that gives another 9 months.  RX sent and pt informed and voiced understanding

## 2013-01-07 NOTE — Telephone Encounter (Signed)
forward

## 2013-01-07 NOTE — Telephone Encounter (Signed)
So OK to refill Trazodone with same sig, same number and 2 rf. After that it will have been a year since she has been in and she will need an appt for an annual exam to get more  meds

## 2013-02-12 ENCOUNTER — Other Ambulatory Visit: Payer: Self-pay | Admitting: Dermatology

## 2013-04-04 ENCOUNTER — Other Ambulatory Visit: Payer: Self-pay | Admitting: Gynecology

## 2013-04-04 DIAGNOSIS — Z1231 Encounter for screening mammogram for malignant neoplasm of breast: Secondary | ICD-10-CM

## 2013-04-11 ENCOUNTER — Other Ambulatory Visit: Payer: Self-pay | Admitting: Gynecology

## 2013-04-11 NOTE — Telephone Encounter (Signed)
Called in [place]

## 2013-04-15 ENCOUNTER — Other Ambulatory Visit: Payer: Self-pay | Admitting: Internal Medicine

## 2013-04-16 ENCOUNTER — Encounter: Payer: Self-pay | Admitting: Family

## 2013-04-16 ENCOUNTER — Ambulatory Visit (INDEPENDENT_AMBULATORY_CARE_PROVIDER_SITE_OTHER): Payer: PRIVATE HEALTH INSURANCE | Admitting: Family

## 2013-04-16 ENCOUNTER — Ambulatory Visit (HOSPITAL_COMMUNITY): Payer: PRIVATE HEALTH INSURANCE

## 2013-04-16 VITALS — BP 100/70 | HR 94 | Temp 97.6°F | Resp 16 | Wt 183.1 lb

## 2013-04-16 DIAGNOSIS — R209 Unspecified disturbances of skin sensation: Secondary | ICD-10-CM

## 2013-04-16 DIAGNOSIS — J45909 Unspecified asthma, uncomplicated: Secondary | ICD-10-CM

## 2013-04-16 DIAGNOSIS — F411 Generalized anxiety disorder: Secondary | ICD-10-CM

## 2013-04-16 DIAGNOSIS — R739 Hyperglycemia, unspecified: Secondary | ICD-10-CM | POA: Insufficient documentation

## 2013-04-16 DIAGNOSIS — R7302 Impaired glucose tolerance (oral): Secondary | ICD-10-CM

## 2013-04-16 DIAGNOSIS — F329 Major depressive disorder, single episode, unspecified: Secondary | ICD-10-CM

## 2013-04-16 DIAGNOSIS — F3289 Other specified depressive episodes: Secondary | ICD-10-CM

## 2013-04-16 DIAGNOSIS — F419 Anxiety disorder, unspecified: Secondary | ICD-10-CM

## 2013-04-16 DIAGNOSIS — R200 Anesthesia of skin: Secondary | ICD-10-CM | POA: Insufficient documentation

## 2013-04-16 DIAGNOSIS — R7309 Other abnormal glucose: Secondary | ICD-10-CM

## 2013-04-16 DIAGNOSIS — H8110 Benign paroxysmal vertigo, unspecified ear: Secondary | ICD-10-CM

## 2013-04-16 LAB — BASIC METABOLIC PANEL
BUN: 13 mg/dL (ref 6–23)
CO2: 29 mEq/L (ref 19–32)
Calcium: 9.6 mg/dL (ref 8.4–10.5)
Chloride: 104 mEq/L (ref 96–112)
Creat: 0.71 mg/dL (ref 0.50–1.10)
Glucose, Bld: 88 mg/dL (ref 70–99)
Potassium: 4.8 mEq/L (ref 3.5–5.3)
Sodium: 138 mEq/L (ref 135–145)

## 2013-04-16 LAB — FOLATE: Folate: 11 ng/mL

## 2013-04-16 LAB — HEMOGLOBIN A1C
Hgb A1c MFr Bld: 5.7 % — ABNORMAL HIGH (ref <5.7)
Mean Plasma Glucose: 117 mg/dL — ABNORMAL HIGH (ref <117)

## 2013-04-16 LAB — VITAMIN B12: Vitamin B-12: 432 pg/mL (ref 211–911)

## 2013-04-16 MED ORDER — ESOMEPRAZOLE MAGNESIUM 40 MG PO CPDR: 40.0000 mg | DELAYED_RELEASE_CAPSULE | Freq: Every day | ORAL | Status: DC

## 2013-04-16 MED ORDER — TRAZODONE HCL 100 MG PO TABS: 100.0000 mg | ORAL_TABLET | Freq: Every day | ORAL | Status: DC

## 2013-04-16 MED ORDER — MONTELUKAST SODIUM 10 MG PO TABS: 10.0000 mg | ORAL_TABLET | Freq: Every day | ORAL | Status: DC

## 2013-04-16 NOTE — Assessment & Plan Note (Signed)
This is being managed by ENT and is stable with prn meclizine.

## 2013-04-16 NOTE — Patient Instructions (Addendum)
Please follow up in 3 months for a complete physical.  Complete lab work prior to leaving.  

## 2013-04-16 NOTE — Assessment & Plan Note (Signed)
Stable with rare use of xanax. This is being prescribed by her GYN.

## 2013-04-16 NOTE — Assessment & Plan Note (Signed)
Bilateral toes- mild. Check b12, folate.  Exam is normal today.

## 2013-04-16 NOTE — Progress Notes (Signed)
Subjective:    Patient ID: Melissa Middleton, female    DOB: 1956/04/10, 57 y.o.   MRN: [id number]  HPI  Melissa Middleton is a 57 yr old female who presents today for follow up.  1) Depression/anxiety- she has chronic insomnia for which she uses trazadone.  She uses xanax on a prn basis. Reports that depression is well controlled as long as she is on the trazadone.   2) Asthma- se is currently maintained on singulair and prn albuterol. She is currently on allergy shots with Dr. Beaulah Dinning and on asthmanex.   3) Glucose intolerance- had glucose 101 back 4 yrs ago per medical record review. Melissa Middleton was diabetic.   4) Hx of vertigo- reports that he had shingles last year (right side of face) , and has some intermittent dizziness ever since.  Uses meclizine prn which helps. She is undergoing a formal evaluation with her ENT.   Review of Systems Reports occasional numbness bilateral toes.     Past Medical History  Diagnosis Date  . Premenstrual tension syndromes     gyn uses alprazolam and spironolactone prn ofr treatment  . Benign neoplasm of stomach     gastric polyps  . Stricture and stenosis of esophagus   . Diaphragmatic hernia without mention of obstruction or gangrene   . Diverticulosis of colon (without mention of hemorrhage)   . Unspecified asthma   . Esophageal reflux     see GI  . Bronchitis     recurrent  . Allergic rhinitis   . Sinusitis     recurrent  . Osteoarthritis     sees ortho-has had steroid shot in knee  . Post menopausal problems     symptoms  . Insomnia   . Anxiety   . Psoriasis     History   Social History  . Marital Status: Married    Spouse Name: N/A    Number of Children: 2  . Years of Education: N/A   Occupational History  .      Administrative work   Social History Main Topics  . Smoking status: Never Smoker   . Smokeless tobacco: Never Used  . Alcohol Use: No  . Drug Use: No  . Sexually Active: Yes   Other Topics Concern  . Not on file    Social History Narrative  . No narrative on file    Past Surgical History  Procedure Laterality Date  . Tubal ligation  2004    with ablation  . Nasal sinus surgery    . Total knee arthroplasty  11/2009    Right knee  . Cesarean section    . Endometrial ablation      Family History  Problem Relation Age of Onset  . Arthritis Other   . Hypertension Mother   . Diabetes Father   . Hypertension Father   . Cancer Sister     MELANOMA  . Hypertension Maternal Grandfather   . Diabetes Paternal Grandmother   . Migraines Neg Hx     Allergies  Allergen Reactions  . Cefuroxime Axetil     REACTION: red face/rash  . Penicillins     REACTION: rash    Current Outpatient Prescriptions on File Prior to Visit  Medication Sig Dispense Refill  . albuterol (VENTOLIN HFA) 108 (90 BASE) MCG/ACT inhaler Inhale 2 puffs into the lungs every 6 (six) hours as needed. For wheezing  1 Inhaler  3  . cetirizine (ZYRTEC) 10 MG tablet Take 10 mg by  mouth daily.        . clotrimazole-betamethasone (LOTRISONE) cream Apply topically 2 (two) times daily.        Marland Kitchen estradiol (ESTRACE) 0.1 MG/GM vaginal cream Place 1 g vaginally 3 (three) times a week.  42.5 g  10  . fluticasone (FLONASE) 50 MCG/ACT nasal spray 2 sprays each nostril once daily       . spironolactone (ALDACTONE) 25 MG tablet Take 25 mg by mouth daily as needed.         No current facility-administered medications on file prior to visit.    BP 100/70  Pulse 94  Temp(Src) 97.6 F (36.4 C) (Oral)  Resp 16  Wt 183 lb 1.9 oz (83.063 kg)  BMI 30.47 kg/m2  SpO2 97%    Objective:   Physical Exam  Constitutional: She is oriented to person, place, and time. She appears well-developed and well-nourished. No distress.  HENT:  Head: Normocephalic and atraumatic.  Cardiovascular: Normal rate and regular rhythm.   No murmur heard. Pulmonary/Chest: Effort normal and breath sounds normal. No respiratory distress. She has no wheezes. She  has no rales. She exhibits no tenderness.  Musculoskeletal: She exhibits no edema.  Neurological: She is alert and oriented to person, place, and time.  Reflex Scores:      Patellar reflexes are 2+ on the right side and 2+ on the left side.      Achilles reflexes are 2+ on the right side and 2+ on the left side. Bilateral foot exam- intact to light touch with monofilament.     Psychiatric: She has a normal mood and affect. Her behavior is normal. Judgment and thought content normal.          Assessment & Plan:

## 2013-04-16 NOTE — Assessment & Plan Note (Signed)
Check A1C 

## 2013-04-16 NOTE — Assessment & Plan Note (Signed)
Stable on Trazadone. Continue same.

## 2013-04-16 NOTE — Assessment & Plan Note (Signed)
Stable on singulair, asmanex,  Albuterol.

## 2013-04-17 ENCOUNTER — Encounter: Payer: Self-pay | Admitting: Family

## 2013-04-23 ENCOUNTER — Ambulatory Visit (HOSPITAL_COMMUNITY): Payer: PRIVATE HEALTH INSURANCE

## 2013-04-23 ENCOUNTER — Ambulatory Visit (INDEPENDENT_AMBULATORY_CARE_PROVIDER_SITE_OTHER): Payer: PRIVATE HEALTH INSURANCE | Admitting: Gynecology

## 2013-04-23 ENCOUNTER — Encounter: Payer: Self-pay | Admitting: Gynecology

## 2013-04-23 VITALS — BP 118/76 | Ht 64.75 in | Wt 185.0 lb

## 2013-04-23 DIAGNOSIS — Z01419 Encounter for gynecological examination (general) (routine) without abnormal findings: Secondary | ICD-10-CM

## 2013-04-23 DIAGNOSIS — L538 Other specified erythematous conditions: Secondary | ICD-10-CM

## 2013-04-23 DIAGNOSIS — N952 Postmenopausal atrophic vaginitis: Secondary | ICD-10-CM

## 2013-04-23 DIAGNOSIS — Z780 Asymptomatic menopausal state: Secondary | ICD-10-CM

## 2013-04-23 DIAGNOSIS — L304 Erythema intertrigo: Secondary | ICD-10-CM

## 2013-04-23 MED ORDER — NYSTATIN-TRIAMCINOLONE 100000-0.1 UNIT/GM-% EX CREA: TOPICAL_CREAM | Freq: Three times a day (TID) | CUTANEOUS | Status: DC

## 2013-04-23 MED ORDER — NONFORMULARY OR COMPOUNDED ITEM: Status: DC

## 2013-04-23 NOTE — Patient Instructions (Addendum)
Shingles Vaccine What You Need to Know WHAT IS SHINGLES?  Shingles is a painful skin rash, often with blisters. It is also called Herpes Zoster or just Zoster.  A shingles rash usually appears on one side of the face or body and lasts from 2 to 4 weeks. Its main symptom is pain, which can be quite severe. Other symptoms of shingles can include fever, headache, chills, and upset stomach. Very rarely, a shingles infection can lead to pneumonia, hearing problems, blindness, brain inflammation (encephalitis), or death.  For about 1 person in 5, severe pain can continue even after the rash clears up. This is called post-herpetic neuralgia.  Shingles is caused by the Varicella Zoster virus. This is the same virus that causes chickenpox. Only someone who has had a case of chickenpox or rarely, has gotten chickenpox vaccine, can get shingles. The virus stays in your body. It can reappear many years later to cause a case of shingles.  You cannot catch shingles from another person with shingles. However, a person who has never had chickenpox (or chickenpox vaccine) could get chickenpox from someone with shingles. This is not very common.  Shingles is far more common in people 50 and older than in younger people. It is also more common in people whose immune systems are weakened because of a disease such as cancer or drugs such as steroids or chemotherapy.  At least 1 million people get shingles per year in the [place]. SHINGLES VACCINE  A vaccine for shingles was licensed in 2006. In clinical trials, the vaccine reduced the risk of shingles by 50%. It can also reduce the pain in people who still get shingles after being vaccinated.  A single dose of shingles vaccine is recommended for adults 60 years of age and older. SOME PEOPLE SHOULD NOT GET SHINGLES VACCINE OR SHOULD WAIT A person should not get shingles vaccine if he or she:  Has ever had a life-threatening allergic reaction to gelatin, the  antibiotic neomycin, or any other component of shingles vaccine. Tell your caregiver if you have any severe allergies.  Has a weakened immune system because of current:  AIDS or another disease that affects the immune system.  Treatment with drugs that affect the immune system, such as prolonged use of high-dose steroids.  Cancer treatment, such as radiation or chemotherapy.  Cancer affecting the bone marrow or lymphatic system, such as leukemia or lymphoma.  Is pregnant, or might be pregnant. Women should not become pregnant until at least 4 weeks after getting shingles vaccine. Someone with a minor illness, such as a cold, may be vaccinated. Anyone with a moderate or severe acute illness should usually wait until he or she recovers before getting the vaccine. This includes anyone with a temperature of 101.3 F (38 C) or higher. WHAT ARE THE RISKS FROM SHINGLES VACCINE?  A vaccine, like any medicine, could possibly cause serious problems, such as severe allergic reactions. However, the risk of a vaccine causing serious harm, or death, is extremely small.  No serious problems have been identified with shingles vaccine. Mild Problems  Redness, soreness, swelling, or itching at the site of the injection (about 1 person in 3).  Headache (about 1 person in 70). Like all vaccines, shingles vaccine is being closely monitored for unusual or severe problems. WHAT IF THERE IS A MODERATE OR SEVERE REACTION? What should I look for? Any unusual condition, such as a severe allergic reaction or a high fever. If a severe allergic reaction   occurred, it would be within a few minutes to an hour after the shot. Signs of a serious allergic reaction can include difficulty breathing, weakness, hoarseness or wheezing, a fast heartbeat, hives, dizziness, paleness, or swelling of the throat. What should I do?  Call your caregiver, or get the person to a caregiver right away.  Tell the caregiver what  happened, the date and time it happened, and when the vaccination was given.  Ask the caregiver to report the reaction by filing a Vaccine Adverse Event Reporting System (VAERS) form. Or, you can file this report through the VAERS web site at www.vaers.LAgents.no or by calling 1-651-761-3217. VAERS does not provide medical advice. HOW CAN I LEARN MORE?  Ask your caregiver. He or she can give you the vaccine package insert or suggest other sources of information.  Contact the Centers for Disease Control and Prevention (CDC):  Call 740-876-4936 (1-800-CDC-INFO).  Visit the CDC website at PicCapture.uy CDC Shingles Vaccine VIS (09/23/08) Document Released: 10/02/2006 Document Revised: 02/27/2012 Document Reviewed: 09/23/2008 [facility] Patient Information 2013 [place], [place].  Intertrigo Intertrigo is a skin condition that occurs in between folds of skin in places on the body that rub together a lot and do not get much ventilation. It is caused by heat, moisture, friction, sweat retention, and lack of air circulation, which produces red, irritated patches and, sometimes, scaling or drainage. People who have diabetes, who are obese, or who have treatment with antibiotics are at increased risk for intertrigo. The most common sites for intertrigo to occur include:  The groin.  The breasts.  The armpits.  Folds of abdominal skin.  Webbed spaces between the fingers or toes. Intertrigo may be aggravated by:  Sweat.  Feces.  Yeast or bacteria that are present near skin folds.  Urine.  Vaginal discharge. HOME CARE INSTRUCTIONS  The following steps can be taken to reduce friction and keep the affected area cool and dry:  Expose skin folds to the air.  Keep deep skin folds separated with cotton or linen cloth. Avoid tight fitting clothing that could cause chafing.  Wear open-toed shoes or sandals to help reduce moisture between the toes.  Apply absorbent powders to affected  areas as directed by your caregiver.  Apply over-the-counter barrier pastes, such as zinc oxide, as directed by your caregiver.  If you develop a fungal infection in the affected area, your caregiver may have you use antifungal creams. SEEK MEDICAL CARE IF:   The rash is not improving after 1 week of treatment.  The rash is getting worse (more red, more swollen, more painful, or spreading).  You have a fever or chills. MAKE SURE YOU:   Understand these instructions.  Will watch your condition.  Will get help right away if you are not doing well or get worse. Document Released: 12/05/2005 Document Revised: 02/27/2012 Document Reviewed: 05/20/2010 [facility] Patient Information 2013 [place], [place].

## 2013-04-23 NOTE — Progress Notes (Signed)
Melissa Middleton Jan 16, 1956 [id number]   History:    57 y.o.  for annual gyn exam with complaint of a small lower abdominal rash. Also vaginal dryness. She had previously been prescribed Estrace vaginal cream to apply twice a week and she has not been using it regularly. Her last colonoscopy was normal in 2008. Her last bone density study was normal in 2011. Patient last year had an outbreak of shingles and has not had a vaccine yet. She does take Xanax 0.25 mg daily when necessary anxiety. She takes Aldactone 25 mg by mouth when necessary fluid retention. 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 Ever since then her Pap smears been normal. Her primary physician is Dr. Rodena Medin who has been doing her lab work.   Past medical history,surgical history, family history and social history were all reviewed and documented in the EPIC chart.  Gynecologic History No LMP recorded. Patient has had an ablation. Contraception: post menopausal status and tubal ligation Last Pap: 2013. Results were: see above Last mammogram: 2013. Results were: normal  Obstetric History OB History   Grav Para Term Preterm Abortions TAB SAB Ect Mult Living   2 2 2       2      # Outc Date GA Lbr Len/2nd Wgt Sex Del Anes PTL Lv   1 TRM     F SVD  No Yes   2 TRM     M CS  No Yes       ROS: A ROS was performed and pertinent positives and negatives are included in the history.  GENERAL: No fevers or chills. HEENT: No change in vision, no earache, sore throat or sinus congestion. NECK: No pain or stiffness. CARDIOVASCULAR: No chest pain or pressure. No palpitations. PULMONARY: No shortness of breath, cough or wheeze. GASTROINTESTINAL: No abdominal pain, nausea, vomiting or diarrhea, melena or bright red blood per rectum. GENITOURINARY: No urinary frequency, urgency, hesitancy or dysuria. MUSCULOSKELETAL: No joint or muscle pain, no back pain, no recent trauma. DERMATOLOGIC:  rash lower abdomen ENDOCRINE: No polyuria, polydipsia, no heat or cold intolerance. No recent change in weight. HEMATOLOGICAL: No anemia or easy bruising or bleeding. NEUROLOGIC: No headache, seizures, numbness, tingling or weakness. PSYCHIATRIC: No depression, no loss of interest in normal activity or change in sleep pattern.     Exam: chaperone present  BP 118/76  Ht 5' 4.75" (1.645 m)  Wt 185 lb (83.915 kg)  BMI 31.01 kg/m2  Body mass index is 31.01 kg/(m^2).  General appearance : Well developed well nourished female. No acute distress HEENT: Neck supple, trachea midline, no carotid bruits, no thyroidmegaly Lungs: Clear to auscultation, no rhonchi or wheezes, or rib retractions  Heart: Regular rate and rhythm, no murmurs or gallops Breast:Examined in sitting and supine position were symmetrical in appearance, no palpable masses or tenderness,  no skin retraction, no nipple inversion, no nipple discharge, no skin discoloration, no axillary or supraclavicular lymphadenopathy Abdomen: intertrigo lower abdomen Extremities: no edema or skin discoloration or tenderness  Pelvic:  Bartholin, Urethra, Skene Glands: Within normal limits             Vagina: No gross lesions or discharge  Cervix: No gross lesions or discharge  Uterus  anteverted, normal size, shape and consistency, non-tender and mobile  Adnexa  Without masses or tenderness  Anus and perineum  normal   Rectovaginal  normal sphincter tone without palpated masses or  tenderness             Hemoccult  Cards provided     Assessment/Plan:  57 y.o. female for annual exam with intertrigo of the lower abdomen. She will be prescribed mytrex cream to apply twice a day for the next 5-7 days. Patient's primary physician will be drawn her lab work. I've given her prescription for the shingles vaccine to obtain her local pharmacy. I've given her literature information on the Tdap vaccine to reason to discuss with her primary physician. She  will also be schedule her bone density study which is overdue. She was reminded to follow up with her mammogram next week and to continue to do her monthly self breast examination. We discussed importance of calcium vitamin D for osteoporosis prevention. No Pap smear was done today. The new screening guidelines were discussed.    Ok Edwards MD, 5:22 PM 04/23/2013

## 2013-04-24 ENCOUNTER — Encounter: Payer: Self-pay | Admitting: Gynecology

## 2013-04-25 ENCOUNTER — Other Ambulatory Visit: Payer: Self-pay | Admitting: Gynecology

## 2013-04-25 ENCOUNTER — Telehealth: Payer: Self-pay

## 2013-04-25 MED ORDER — PREDNISONE 10 MG PO KIT: PACK | ORAL | Status: DC

## 2013-04-25 NOTE — Telephone Encounter (Signed)
Patient said when she was in Tuesday she had a rash and discussed it with you. She had been on Macrobid since Friday and it had been getting worse.  It was decided she was probably having allergic reaction so she discontinued it.  It had been prescribed by a NP at [facility].  She said the rash is now very itchy and she stayed home from work today because she is so itchy.  She has not taken anymore Macrobid since she saw you Tuesday.    She said Benadryl helps a little but she wonders if she needs something more. She said she was so miserable last night after she had taken a [place] and she had one Prednisone tablet leftover from old Rx and she took it and she think it helped. She was wondering if perhaps a round of Prednisone might bring her relief?   She mentioned being curious what her u/a from 5/6 visit with you will show but I do not see it was ordered.

## 2013-04-25 NOTE — Telephone Encounter (Signed)
Patient asked if you think five days of Macrobid was enough. Her UTI symptoms have completely resolved. I let her know that we did not do u/a on 04/23/13.  What to rec?

## 2013-04-25 NOTE — Telephone Encounter (Signed)
That should be fine because she is not having any symptoms. If she does have some symptoms have her come by and we can run a urine culture. I would hold off on any antibiotic now since she is doing fine.

## 2013-04-25 NOTE — Telephone Encounter (Signed)
Please call in a prescription for Sterapred DS   10 mg(for 12 days) and take as directed on the indicated card because her numbers tablets will very over the course of the 12 days.

## 2013-04-25 NOTE — Telephone Encounter (Signed)
Patient advised Sterapred caleld in and of below info.

## 2013-04-30 ENCOUNTER — Ambulatory Visit (HOSPITAL_COMMUNITY)
Admission: RE | Admit: 2013-04-30 | Discharge: 2013-04-30 | Disposition: A | Discharge status: Home / Self Care | Payer: 59 | Source: Ambulatory Visit | Attending: Gynecology | Admitting: Gynecology

## 2013-04-30 DIAGNOSIS — Z1231 Encounter for screening mammogram for malignant neoplasm of breast: Secondary | ICD-10-CM | POA: Insufficient documentation

## 2013-07-15 ENCOUNTER — Telehealth: Payer: Self-pay

## 2013-07-15 ENCOUNTER — Other Ambulatory Visit: Payer: Self-pay | Admitting: Gynecology

## 2013-07-15 NOTE — Telephone Encounter (Signed)
The patient may have Xanax 0.25 mg to take 1 by mouth daily #90 with 3 refills

## 2013-07-15 NOTE — Telephone Encounter (Signed)
Pharmacy contacted [place].  Patient's Rx insurance plan requires 90 day supply Rx.  Patient is requesting refill on Xanax 0.25mg .

## 2013-07-15 NOTE — Telephone Encounter (Signed)
Called into pharmacy

## 2013-07-16 ENCOUNTER — Telehealth: Payer: Self-pay | Admitting: *Deleted

## 2013-07-16 ENCOUNTER — Encounter: Payer: Self-pay | Admitting: Family

## 2013-07-16 ENCOUNTER — Ambulatory Visit (INDEPENDENT_AMBULATORY_CARE_PROVIDER_SITE_OTHER): Payer: PRIVATE HEALTH INSURANCE | Admitting: Family

## 2013-07-16 VITALS — BP 116/82 | HR 75 | Temp 97.5°F | Resp 18 | Ht 65.25 in | Wt 182.1 lb

## 2013-07-16 DIAGNOSIS — Z0000 Encounter for general adult medical examination without abnormal findings: Secondary | ICD-10-CM | POA: Insufficient documentation

## 2013-07-16 LAB — CBC WITH DIFFERENTIAL/PLATELET
Basophils Absolute: 0 10*3/uL (ref 0.0–0.1)
Basophils Relative: 0 % (ref 0–1)
Eosinophils Absolute: 0.5 10*3/uL (ref 0.0–0.7)
Eosinophils Relative: 7 % — ABNORMAL HIGH (ref 0–5)
HCT: 39.7 % (ref 36.0–46.0)
Hemoglobin: 13.3 g/dL (ref 12.0–15.0)
Lymphocytes Relative: 29 % (ref 12–46)
Lymphs Abs: 2.2 10*3/uL (ref 0.7–4.0)
MCH: 28.1 pg (ref 26.0–34.0)
MCHC: 33.5 g/dL (ref 30.0–36.0)
MCV: 83.9 fL (ref 78.0–100.0)
Monocytes Absolute: 0.7 10*3/uL (ref 0.1–1.0)
Monocytes Relative: 9 % (ref 3–12)
Neutro Abs: 4.2 10*3/uL (ref 1.7–7.7)
Neutrophils Relative %: 55 % (ref 43–77)
Platelets: 326 10*3/uL (ref 150–400)
RBC: 4.73 MIL/uL (ref 3.87–5.11)
RDW: 14.1 % (ref 11.5–15.5)
WBC: 7.5 10*3/uL (ref 4.0–10.5)

## 2013-07-16 LAB — LIPID PANEL
Cholesterol: 205 mg/dL — ABNORMAL HIGH (ref 0–200)
HDL: 59 mg/dL (ref >39)
LDL Cholesterol: 122 mg/dL — ABNORMAL HIGH (ref 0–99)
Total CHOL/HDL Ratio: 3.5 Ratio
Triglycerides: 118 mg/dL (ref <150)
VLDL: 24 mg/dL (ref 0–40)

## 2013-07-16 LAB — HEPATIC FUNCTION PANEL
ALT: 11 U/L (ref 0–35)
AST: 14 U/L (ref 0–37)
Albumin: 4.3 g/dL (ref 3.5–5.2)
Alkaline Phosphatase: 66 U/L (ref 39–117)
Bilirubin, Direct: 0.1 mg/dL (ref 0.0–0.3)
Indirect Bilirubin: 0.4 mg/dL (ref 0.0–0.9)
Total Bilirubin: 0.5 mg/dL (ref 0.3–1.2)
Total Protein: 7.1 g/dL (ref 6.0–8.3)

## 2013-07-16 MED ORDER — TRAZODONE HCL 100 MG PO TABS: 100.0000 mg | ORAL_TABLET | Freq: Every day | ORAL | Status: DC

## 2013-07-16 MED ORDER — ESOMEPRAZOLE MAGNESIUM 40 MG PO CPDR: 40.0000 mg | DELAYED_RELEASE_CAPSULE | Freq: Every day | ORAL | Status: DC

## 2013-07-16 MED ORDER — ZOSTER VACCINE LIVE 19400 UNT/0.65ML ~~LOC~~ SOLR: 0.6500 mL | Freq: Once | SUBCUTANEOUS | Status: DC

## 2013-07-16 MED ORDER — MONTELUKAST SODIUM 10 MG PO TABS: 10.0000 mg | ORAL_TABLET | Freq: Every day | ORAL | Status: DC

## 2013-07-16 NOTE — Assessment & Plan Note (Signed)
Rx provided for zostavax. She will bring to her pharmacy.  Obtain fasting lab work. Pt will schedule Dexa.  Td up to date.

## 2013-07-16 NOTE — Telephone Encounter (Signed)
I am happy to write letter.

## 2013-07-16 NOTE — Telephone Encounter (Signed)
Received message from pt stating she checked with her insurance company re: coverage for shingles vaccine and was told we would need to submit a letter of medical necessity. Pt has previous history of shingles that she states has affected her hearing.  Please advise.

## 2013-07-16 NOTE — Progress Notes (Signed)
Subjective:    Patient ID: Melissa Middleton, female    DOB: [birth year], 57 y.o.   MRN: [id number]  HPI  Melissa Middleton is a 57 yr old female who presents today for CPX.  Immunizations: tetanus up to date Diet: reports that her diet is healthy, but eats too many sweets.  Exercise: not exercising regularly. Colonoscopy: up to date Dexa: pending per gyn Pap Smear: up to date Mammogram: up to date  Psoriasis- has spots on her knees.  Not using regularly.  She also has area on her scalp.  Using a medicated shampoo.  Plans to restart steroid cream.   She is concerned about her posture and the fact that she hold her head forward.  Trying to reduce stress. She is now having some neck pain.  Seeing a Land.  She was told by her chiropractor that she has "bone spurs" in her neck.    Vertigo- did VGN with audiology.  She has discussed with Dr. Verdie Drown.  They have placed referral for vestibular rehab.     Review of Systems  Constitutional: Negative for unexpected weight change.  HENT: Negative for hearing loss and congestion.   Eyes: Negative for visual disturbance.  Respiratory: Negative for cough.   Cardiovascular: Negative for chest pain.  Gastrointestinal: Negative for nausea, vomiting and diarrhea.  Genitourinary: Negative for dysuria and frequency.  Musculoskeletal:       Some neck pain  Skin: Positive for rash.  Neurological:       Some headaches with neck pain  Psychiatric/Behavioral:       Reports that she had some anxiety earlier in July.  Cut back work hours which she thinks will help   Past Medical History  Diagnosis Date  . Premenstrual tension syndromes     gyn uses alprazolam and spironolactone prn ofr treatment  . Benign neoplasm of stomach     gastric polyps  . Stricture and stenosis of esophagus   . Diaphragmatic hernia without mention of obstruction or gangrene   . Diverticulosis of colon (without mention of hemorrhage)   . Unspecified asthma(493.90)   .  Esophageal reflux     see GI  . Bronchitis     recurrent  . Allergic rhinitis   . Sinusitis     recurrent  . Osteoarthritis     sees ortho-has had steroid shot in knee  . Post menopausal problems     symptoms  . Insomnia   . Anxiety   . Psoriasis     History   Social History  . Marital Status: Married    Spouse Name: N/A    Number of Children: 2  . Years of Education: N/A   Occupational History  .      Administrative work   Social History Main Topics  . Smoking status: Never Smoker   . Smokeless tobacco: Never Used  . Alcohol Use: No  . Drug Use: No  . Sexually Active: Yes   Other Topics Concern  . Not on file   Social History Narrative  . No narrative on file    Past Surgical History  Procedure Laterality Date  . Tubal ligation  2004    with ablation  . Nasal sinus surgery    . Total knee arthroplasty  11/2009    Right knee  . Cesarean section    . Endometrial ablation      Family History  Problem Relation Age of Onset  . Arthritis Other   . Hypertension  Mother   . Diabetes Father   . Hypertension Father   . Cancer Sister     MELANOMA  . Hypertension Maternal Grandfather   . Diabetes Paternal Grandmother   . Migraines Neg Hx     Allergies  Allergen Reactions  . Macrodantin (Nitrofurantoin Macrocrystal) Itching and Rash  . Cefuroxime Axetil Rash and Other (See Comments)    REACTION: red face/rash  . Penicillins Rash    Current Outpatient Prescriptions on File Prior to Visit  Medication Sig Dispense Refill  . albuterol (VENTOLIN HFA) 108 (90 BASE) MCG/ACT inhaler Inhale 2 puffs into the lungs every 6 (six) hours as needed. For wheezing  1 Inhaler  3  . ALPRAZolam (XANAX) 0.25 MG tablet Take 0.25 mg by mouth daily as needed.       . cetirizine (ZYRTEC) 10 MG tablet Take 10 mg by mouth daily.        . clotrimazole-betamethasone (LOTRISONE) cream Apply topically 2 (two) times daily.        . fluticasone (FLONASE) 50 MCG/ACT nasal spray 2  sprays each nostril once daily       . meclizine (ANTIVERT) 25 MG tablet Take 25 mg by mouth 3 (three) times daily as needed.      . NONFORMULARY OR COMPOUNDED ITEM Estradiol .02% 1 ML Prefilled Applicator Sig: apply vaginally twice a week #90 Day Supply with 4 refills  1 each  4  . nystatin-triamcinolone (MYCOLOG II) cream Apply topically 3 (three) times daily.  30 g  2  . spironolactone (ALDACTONE) 25 MG tablet Take 25 mg by mouth daily as needed.         No current facility-administered medications on file prior to visit.    BP 116/82  Pulse 75  Temp(Src) 97.5 F (36.4 C) (Oral)  Resp 18  Ht 5' 5.25" (1.657 m)  Wt 182 lb 1.9 oz (82.609 kg)  BMI 30.09 kg/m2  SpO2 99%       Objective:   Physical Exam  Skin:  Psoriatic rash noted overlying bilateral anterior knees, anterior scalp line.      Physical Exam  Constitutional: She is oriented to person, place, and time. She appears well-developed and well-nourished. No distress.  HENT:  Head: Normocephalic and atraumatic.  Right Ear: Tympanic membrane and ear canal normal.  Left Ear: Tympanic membrane and ear canal normal.  Mouth/Throat: Oropharynx is clear and moist.  Eyes: Pupils are equal, round, and reactive to light. No scleral icterus.  Neck: Normal range of motion. No thyromegaly present.  Cardiovascular: Normal rate and regular rhythm.   No murmur heard. Pulmonary/Chest: Effort normal and breath sounds normal. No respiratory distress. He has no wheezes. She has no rales. She exhibits no tenderness.  Abdominal: Soft. Bowel sounds are normal. He exhibits no distension and no mass. There is no tenderness. There is no rebound and no guarding.  Musculoskeletal: She exhibits no edema.  Lymphadenopathy:    She has no cervical adenopathy.  Neurological: She is alert and oriented to person, place, and time.  She exhibits normal muscle tone. Coordination normal.  Skin: Skin is warm and dry.  Psychiatric: She has a normal mood  and affect. Her behavior is normal. Judgment and thought content normal.  Breast/pelvic: deferred to GYN          Assessment & Plan:        Assessment & Plan:

## 2013-07-16 NOTE — Telephone Encounter (Signed)
See letter.

## 2013-07-16 NOTE — Patient Instructions (Addendum)
Please complete lab work prior to leaving. Let me know if you would like referral for physical therapy for your neck.   Please follow up in 6 months, sooner if problems/concerns.

## 2013-07-17 ENCOUNTER — Encounter: Payer: Self-pay | Admitting: Family

## 2013-07-17 LAB — URINALYSIS, ROUTINE W REFLEX MICROSCOPIC
Bilirubin Urine: NEGATIVE
Glucose, UA: NEGATIVE mg/dL
Hgb urine dipstick: NEGATIVE
Ketones, ur: NEGATIVE mg/dL
Leukocytes, UA: NEGATIVE
Nitrite: NEGATIVE
Protein, ur: NEGATIVE mg/dL
Specific Gravity, Urine: 1.021 (ref 1.005–1.030)
Urobilinogen, UA: 0.2 mg/dL (ref 0.0–1.0)
pH: 6 (ref 5.0–8.0)

## 2013-07-17 LAB — BASIC METABOLIC PANEL WITH GFR
BUN: 15 mg/dL (ref 6–23)
CO2: 28 mEq/L (ref 19–32)
Calcium: 9.6 mg/dL (ref 8.4–10.5)
Chloride: 103 mEq/L (ref 96–112)
Creat: 0.85 mg/dL (ref 0.50–1.10)
GFR, Est African American: 88 mL/min
GFR, Est Non African American: 76 mL/min
Glucose, Bld: 92 mg/dL (ref 70–99)
Potassium: 4.6 mEq/L (ref 3.5–5.3)
Sodium: 140 mEq/L (ref 135–145)

## 2013-07-17 LAB — TSH: TSH: 1.217 u[IU]/mL (ref 0.350–4.500)

## 2013-07-17 NOTE — Telephone Encounter (Signed)
Left detailed message on pt's cell# re: letter completion. Letter placed at front desk for pt to pick up and to call if any questions.

## 2013-08-08 ENCOUNTER — Telehealth: Payer: Self-pay | Admitting: Family

## 2013-08-08 NOTE — Telephone Encounter (Signed)
Caller: Brylyn/Patient; Phone: [phone]; Reason for Call: Called to request copy of letter from [facility].  Peggyann Juba regarding medical necessity for shingles vaccine be faxed to [employer]; Attention Medical Review at fax [phone].  Please include insurered ID name and number, FUSAKO TANABE ID#: [id number].  Although the office mailed the letter 3 weeks ago, the insurance company never received it.  Please call Perlita's cell (above) to advise when fax has been sent.

## 2013-08-09 NOTE — Telephone Encounter (Signed)
Letter reprinted and faxed to number below. Notified pt.

## 2013-10-16 ENCOUNTER — Other Ambulatory Visit: Payer: Self-pay | Admitting: Family

## 2013-10-16 NOTE — Telephone Encounter (Signed)
Rx request Denied; Last Rx confirmation 07.29.14, #90x1 refill/SLS

## 2013-10-24 ENCOUNTER — Other Ambulatory Visit: Payer: Self-pay

## 2013-12-06 ENCOUNTER — Ambulatory Visit: Payer: 59 | Admitting: Physician Assistant

## 2013-12-30 ENCOUNTER — Telehealth: Payer: Self-pay | Admitting: Family

## 2013-12-30 NOTE — Telephone Encounter (Signed)
Pt wants to know if she has had the pneumonia injection, if she has and we don't have proof, will she be able to get one. Please advise

## 2013-12-30 NOTE — Telephone Encounter (Signed)
She got a letter in July from [place] stating it was medically necessary for her to have a shingles shot.  Her insurance denied it.  She needs a more detailed letter stating she was out of work for 6 weeks due to shingles and the complications that resulted.  She went to eye doctor and then she had to go to the ear doctor because  It was affecting the vestibular nerve causing vertigo.   Every doctor she has seen has said the it is likely to come back in the same area and so it was medically necessary for there to have the shot even though she is not 60 yet.

## 2013-12-31 ENCOUNTER — Encounter: Payer: Self-pay | Admitting: Family

## 2013-12-31 NOTE — Telephone Encounter (Signed)
Notified pt. She requests letter be mailed to her home address. States she will call back to arrange a time to come in for her pneumonia vaccine.  Letter mailed.

## 2013-12-31 NOTE — Telephone Encounter (Signed)
We do not have record of her receiving pneumovax.  Due to asthma history, I would recommend that she receive a dose of the standard pneumovax.  See medical necessity letter re: zostavax.

## 2014-01-14 ENCOUNTER — Ambulatory Visit: Payer: PRIVATE HEALTH INSURANCE | Admitting: Family

## 2014-03-21 ENCOUNTER — Other Ambulatory Visit: Payer: Self-pay | Admitting: Gynecology

## 2014-03-25 NOTE — Telephone Encounter (Signed)
Called into pharmacy

## 2014-04-06 ENCOUNTER — Other Ambulatory Visit: Payer: Self-pay | Admitting: Family

## 2014-04-27 ENCOUNTER — Other Ambulatory Visit: Payer: Self-pay | Admitting: Family

## 2014-04-28 NOTE — Telephone Encounter (Signed)
eScribe request from [facility] for refill on Trazodone & Singulair Last filled - 07.29.14, #90x1 [Both Rxs] Last AEX - 07.24.2014 Next AEX - 6-Mths [no follow-up appt scheduled] Please Advise on refills/SLS

## 2014-04-29 NOTE — Telephone Encounter (Signed)
30 day supply sent to pharmacy. Please call pt to arrange appt within 30 days.

## 2014-04-29 NOTE — Telephone Encounter (Signed)
Ok to send 30 tabs of each, no additional refills until seen in office.

## 2014-04-30 NOTE — Telephone Encounter (Signed)
Informed patient of medication refill and she states that she will call back to schedule appointment °

## 2014-05-13 ENCOUNTER — Other Ambulatory Visit: Payer: Self-pay | Admitting: Gynecology

## 2014-05-13 DIAGNOSIS — Z1231 Encounter for screening mammogram for malignant neoplasm of breast: Secondary | ICD-10-CM

## 2014-05-19 ENCOUNTER — Ambulatory Visit (INDEPENDENT_AMBULATORY_CARE_PROVIDER_SITE_OTHER): Payer: 59 | Admitting: Family

## 2014-05-19 ENCOUNTER — Encounter: Payer: Self-pay | Admitting: Family

## 2014-05-19 ENCOUNTER — Ambulatory Visit (HOSPITAL_COMMUNITY)
Admission: RE | Admit: 2014-05-19 | Discharge: 2014-05-19 | Disposition: A | Discharge status: Home / Self Care | Payer: 59 | Source: Ambulatory Visit | Attending: Gynecology | Admitting: Gynecology

## 2014-05-19 VITALS — BP 130/88 | HR 83 | Temp 98.2°F | Resp 16 | Ht 65.25 in | Wt 189.0 lb

## 2014-05-19 DIAGNOSIS — G4700 Insomnia, unspecified: Secondary | ICD-10-CM

## 2014-05-19 DIAGNOSIS — F419 Anxiety disorder, unspecified: Secondary | ICD-10-CM

## 2014-05-19 DIAGNOSIS — K219 Gastro-esophageal reflux disease without esophagitis: Secondary | ICD-10-CM

## 2014-05-19 DIAGNOSIS — Z1231 Encounter for screening mammogram for malignant neoplasm of breast: Secondary | ICD-10-CM | POA: Insufficient documentation

## 2014-05-19 DIAGNOSIS — F411 Generalized anxiety disorder: Secondary | ICD-10-CM

## 2014-05-19 DIAGNOSIS — J45909 Unspecified asthma, uncomplicated: Secondary | ICD-10-CM

## 2014-05-19 MED ORDER — MONTELUKAST SODIUM 10 MG PO TABS: ORAL_TABLET | ORAL | Status: DC

## 2014-05-19 MED ORDER — FLUTICASONE-SALMETEROL 250-50 MCG/DOSE IN AEPB: 1.0000 | INHALATION_SPRAY | Freq: Two times a day (BID) | RESPIRATORY_TRACT | Status: DC

## 2014-05-19 MED ORDER — ESOMEPRAZOLE MAGNESIUM 40 MG PO CPDR: DELAYED_RELEASE_CAPSULE | ORAL | Status: DC

## 2014-05-19 MED ORDER — TRAZODONE HCL 100 MG PO TABS: ORAL_TABLET | ORAL | Status: DC

## 2014-05-19 NOTE — Assessment & Plan Note (Signed)
Uncontrolled.  D/C asmanex and start advair.

## 2014-05-19 NOTE — Progress Notes (Signed)
Pre visit review using our clinic review tool, if applicable. No additional management support is needed unless otherwise documented below in the visit note. 

## 2014-05-19 NOTE — Patient Instructions (Signed)
Schedule complete physical in August.

## 2014-05-19 NOTE — Progress Notes (Signed)
Subjective:    Patient ID: Melissa Middleton, female    DOB: 07/03/1956, 58 y.o.   MRN: [id number]  HPI Melissa Middleton is here for refill of her medications: nexium, montelukast, and trazadone.  GERD- reports no reflux as long as she takes her nexium.   Sleep-reports that she cannot sleep without taking the trazadone. Sleeps pretty well on trazodone.  Asthma - uses albuterol 3-4 times per week. She also uses singulair and asmanex.  Anxiety- uses xanax rarely Review of Systems  Constitutional: Negative for fever, chills and fatigue.  Respiratory:       No nocturnal dyspnea.  Gastrointestinal: Negative for nausea, diarrhea and constipation.       No blood in stool.   Takes allergy injection weekly  Past Medical History  Diagnosis Date  . Premenstrual tension syndromes     gyn uses alprazolam and spironolactone prn ofr treatment  . Benign neoplasm of stomach     gastric polyps  . Stricture and stenosis of esophagus   . Diaphragmatic hernia without mention of obstruction or gangrene   . Diverticulosis of colon (without mention of hemorrhage)   . Unspecified asthma(493.90)   . Esophageal reflux     see GI  . Bronchitis     recurrent  . Allergic rhinitis   . Sinusitis     recurrent  . Osteoarthritis     sees ortho-has had steroid shot in knee  . Post menopausal problems     symptoms  . Insomnia   . Anxiety   . Psoriasis     History   Social History  . Marital Status: Married    Spouse Name: N/A    Number of Children: 2  . Years of Education: N/A   Occupational History  .      Administrative work   Social History Main Topics  . Smoking status: Never Smoker   . Smokeless tobacco: Never Used  . Alcohol Use: No  . Drug Use: No  . Sexual Activity: Yes   Other Topics Concern  . Not on file   Social History Narrative  . No narrative on file    Past Surgical History  Procedure Laterality Date  . Tubal ligation  2004    with ablation  . Nasal sinus  surgery    . Total knee arthroplasty  11/2009    Right knee  . Cesarean section    . Endometrial ablation      Family History  Problem Relation Age of Onset  . Arthritis Other   . Hypertension Mother   . Diabetes Father   . Hypertension Father   . Cancer Sister     MELANOMA  . Hypertension Maternal Grandfather   . Diabetes Paternal Grandmother   . Migraines Neg Hx     Allergies  Allergen Reactions  . Macrodantin [Nitrofurantoin Macrocrystal] Itching and Rash  . Cefuroxime Axetil Rash and Other (See Comments)    REACTION: red face/rash  . Penicillins Rash    Current Outpatient Prescriptions on File Prior to Visit  Medication Sig Dispense Refill  . albuterol (VENTOLIN HFA) 108 (90 BASE) MCG/ACT inhaler Inhale 2 puffs into the lungs every 6 (six) hours as needed. For wheezing  1 Inhaler  3  . ALPRAZolam (XANAX) 0.25 MG tablet TAKE 1 TABLET EVERY DAY AS NEEDED  90 tablet  0  . cetirizine (ZYRTEC) 10 MG tablet Take 10 mg by mouth daily.        . clotrimazole-betamethasone (LOTRISONE)  cream Apply topically 2 (two) times daily.        . fluticasone (FLONASE) 50 MCG/ACT nasal spray 2 sprays each nostril once daily       . meclizine (ANTIVERT) 25 MG tablet Take 25 mg by mouth 3 (three) times daily as needed.      . montelukast (SINGULAIR) 10 MG tablet TAKE 1 TABLET (10 MG TOTAL) BY MOUTH AT BEDTIME.  30 tablet  0  . NEXIUM 40 MG capsule TAKE 1 CAPSULE (40 MG TOTAL) BY MOUTH DAILY BEFORE BREAKFAST.  90 capsule  1  . NONFORMULARY OR COMPOUNDED ITEM Estradiol .02% 1 ML Prefilled Applicator Sig: apply vaginally twice a week #90 Day Supply with 4 refills  1 each  4  . nystatin-triamcinolone (MYCOLOG II) cream Apply topically 3 (three) times daily.  30 g  2  . spironolactone (ALDACTONE) 25 MG tablet Take 25 mg by mouth daily as needed.        . traZODone (DESYREL) 100 MG tablet TAKE 1 TABLET (100 MG TOTAL) BY MOUTH AT BEDTIME.  30 tablet  0  . zoster vaccine live, PF, (ZOSTAVAX) 10258  UNT/0.65ML injection Inject 19,400 Units into the skin once.  1 each  0   No current facility-administered medications on file prior to visit.    BP 130/88  Pulse 83  Temp(Src) 98.2 F (36.8 C) (Oral)  Resp 16  Ht 5' 5.25" (1.657 m)  Wt 189 lb (85.73 kg)  BMI 31.22 kg/m2  SpO2 99%        Objective:   Physical Exam  Constitutional: She is oriented to person, place, and time. She appears well-developed and well-nourished. No distress.  HENT:  Head: Normocephalic and atraumatic.  Mouth/Throat: No oropharyngeal exudate.  Eyes: No scleral icterus.  Cardiovascular: Normal rate, regular rhythm and normal heart sounds.   No murmur heard. Pulmonary/Chest: Effort normal and breath sounds normal. No respiratory distress.  Abdominal: Soft. Bowel sounds are normal. She exhibits no distension and no mass. There is no tenderness.  Lymphadenopathy:    She has no cervical adenopathy.  Neurological: She is alert and oriented to person, place, and time.  Skin: Skin is warm and dry. She is not diaphoretic.  Psychiatric: She has a normal mood and affect. Her behavior is normal.       Assessment & Plan:  Patient seen by [facility] NP-student.  I have personally seen and examined patient and agree with Melissa Middleton's assessment and plan.

## 2014-05-19 NOTE — Assessment & Plan Note (Signed)
Stable on trazadone.

## 2014-05-19 NOTE — Assessment & Plan Note (Signed)
Stable on prn xanax.  

## 2014-05-19 NOTE — Assessment & Plan Note (Signed)
Stable on prn nexium.

## 2014-05-20 ENCOUNTER — Ambulatory Visit: Payer: 59 | Admitting: Family

## 2014-06-15 ENCOUNTER — Telehealth: Payer: Self-pay | Admitting: Gynecology

## 2014-06-15 MED ORDER — SULFAMETHOXAZOLE-TRIMETHOPRIM 800-160 MG PO TABS: 1.0000 | ORAL_TABLET | Freq: Two times a day (BID) | ORAL | Status: DC

## 2014-06-15 NOTE — Telephone Encounter (Signed)
On call note:  History of UTI's and knows symptoms.  Started last night with pulling sensation, mild dysuria and frequency.  No fevers, chill, N/V, low back pain.  Septra DS bid X 3. OV if continues or worsens.

## 2014-06-27 ENCOUNTER — Encounter: Payer: Self-pay | Admitting: Gynecology

## 2014-06-27 ENCOUNTER — Ambulatory Visit (INDEPENDENT_AMBULATORY_CARE_PROVIDER_SITE_OTHER): Payer: PRIVATE HEALTH INSURANCE | Admitting: Gynecology

## 2014-06-27 VITALS — BP 124/80 | Ht 65.0 in | Wt 181.0 lb

## 2014-06-27 DIAGNOSIS — F419 Anxiety disorder, unspecified: Secondary | ICD-10-CM

## 2014-06-27 DIAGNOSIS — N951 Menopausal and female climacteric states: Secondary | ICD-10-CM

## 2014-06-27 DIAGNOSIS — R609 Edema, unspecified: Secondary | ICD-10-CM

## 2014-06-27 DIAGNOSIS — Z01419 Encounter for gynecological examination (general) (routine) without abnormal findings: Secondary | ICD-10-CM

## 2014-06-27 DIAGNOSIS — Z780 Asymptomatic menopausal state: Secondary | ICD-10-CM

## 2014-06-27 DIAGNOSIS — N952 Postmenopausal atrophic vaginitis: Secondary | ICD-10-CM

## 2014-06-27 DIAGNOSIS — F411 Generalized anxiety disorder: Secondary | ICD-10-CM

## 2014-06-27 DIAGNOSIS — E8779 Other fluid overload: Secondary | ICD-10-CM

## 2014-06-27 MED ORDER — SPIRONOLACTONE 25 MG PO TABS: 25.0000 mg | ORAL_TABLET | Freq: Every day | ORAL | Status: DC | PRN

## 2014-06-27 MED ORDER — ALPRAZOLAM 0.25 MG PO TABS: 0.2500 mg | ORAL_TABLET | Freq: Every evening | ORAL | Status: DC | PRN

## 2014-06-27 MED ORDER — NONFORMULARY OR COMPOUNDED ITEM: Status: DC

## 2014-06-27 NOTE — Progress Notes (Signed)
Melissa Middleton March 14, 1956 [id number]   History:    58 y.o.  for annual gyn exam with complaints of vaginal dryness, anxiety and fluid retention as she has in the past.She had previously been prescribed Estrace vaginal cream to apply twice a week and she has not been using it regularly. Her last colonoscopy was normal in 2008. Her last bone density study was normal in 2011. Patient had a number shingles in 2013 has not had a shingles vaccine as of yet.She does take Xanax 0.25 mg daily when necessary anxiety. She takes Aldactone 25 mg by mouth when necessary fluid retention. 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 Ever since then her Pap smears been normal. Her primary physician is Dr. Elizebeth Koller who has been doing her lab work.   Past medical history,surgical history, family history and social history were all reviewed and documented in the EPIC chart.  Gynecologic History No LMP recorded. Patient has had an ablation. Contraception: post menopausal status Last Pap: 2013. Results were: normal Last mammogram: 2015. Results were: normal  Obstetric History OB History  Gravida Para Term Preterm AB SAB TAB Ectopic Multiple Living  2 2 2       2     # Outcome Date GA Lbr Len/2nd Weight Sex Delivery Anes PTL Lv  2 TRM     M CS  N Y  1 TRM     F SVD  N Y       ROS: A ROS was performed and pertinent positives and negatives are included in the history.  GENERAL: No fevers or chills. HEENT: No change in vision, no earache, sore throat or sinus congestion. NECK: No pain or stiffness. CARDIOVASCULAR: No chest pain or pressure. No palpitations. PULMONARY: No shortness of breath, cough or wheeze. GASTROINTESTINAL: No abdominal pain, nausea, vomiting or diarrhea, melena or bright red blood per rectum. GENITOURINARY: No urinary frequency, urgency, hesitancy or dysuria. MUSCULOSKELETAL: No joint or muscle pain, no back pain, no recent trauma.  DERMATOLOGIC: No rash, no itching, no lesions. ENDOCRINE: No polyuria, polydipsia, no heat or cold intolerance. No recent change in weight. HEMATOLOGICAL: No anemia or easy bruising or bleeding. NEUROLOGIC: No headache, seizures, numbness, tingling or weakness. PSYCHIATRIC: No depression, no loss of interest in normal activity or change in sleep pattern.     Exam: chaperone present  BP 124/80  Ht 5\' 5"  (1.651 m)  Wt 181 lb (82.101 kg)  BMI 30.12 kg/m2  Body mass index is 30.12 kg/(m^2).  General appearance : Well developed well nourished female. No acute distress HEENT: Neck supple, trachea midline, no carotid bruits, no thyroidmegaly Lungs: Clear to auscultation, no rhonchi or wheezes, or rib retractions  Heart: Regular rate and rhythm, no murmurs or gallops Breast:Examined in sitting and supine position were symmetrical in appearance, no palpable masses or tenderness,  no skin retraction, no nipple inversion, no nipple discharge, no skin discoloration, no axillary or supraclavicular lymphadenopathy Abdomen: no palpable masses or tenderness, no rebound or guarding Extremities: no edema or skin discoloration or tenderness  Pelvic:  Bartholin, Urethra, Skene Glands: Within normal limits             Vagina: No gross lesions or discharge, atrophic changes  Cervix: No gross lesions or discharge  Uterus  anteverted, normal size, shape and consistency, non-tender and mobile  Adnexa  Without masses or tenderness  Anus and perineum  normal   Rectovaginal  normal sphincter tone without palpated masses or tenderness             Hemoccult card provided     Assessment/Plan:  58 y.o. female for annual exam who was reminded of her overdue colonoscopy. Patient was prescribed estradiol vaginal cream to apply twice a week for vaginal atrophy. She was given prescription Xanax 0.25 mg to take 1 by mouth daily when necessary anxiety. Her fluid retention she was prescribed once again Aldactone 25 mg  which she takes daily which has helped. Pap smear was not done today the cords the new guidelines. She will schedule her bone density study in the next 2 weeks. We discussed importance of monthly self breast examination. We discussed the importance of calcium vitamin D and regular exercise for osteoporosis prevention. She was reminded to submit to the office of fecal Hemoccult cards for testing.  Note: This dictation was prepared with  Dragon/digital dictation along withSmart phrase technology. Any transcriptional errors that result from this process are unintentional.   Terrance Mass MD, 5:27 PM 06/27/2014

## 2014-06-27 NOTE — Patient Instructions (Signed)

## 2014-08-11 ENCOUNTER — Ambulatory Visit (INDEPENDENT_AMBULATORY_CARE_PROVIDER_SITE_OTHER): Payer: PRIVATE HEALTH INSURANCE

## 2014-08-11 ENCOUNTER — Other Ambulatory Visit: Payer: Self-pay | Admitting: Gynecology

## 2014-08-11 DIAGNOSIS — Z13820 Encounter for screening for osteoporosis: Secondary | ICD-10-CM

## 2014-08-11 DIAGNOSIS — Z780 Asymptomatic menopausal state: Secondary | ICD-10-CM

## 2014-08-15 ENCOUNTER — Other Ambulatory Visit: Payer: Self-pay | Admitting: *Deleted

## 2014-08-15 DIAGNOSIS — Z01419 Encounter for gynecological examination (general) (routine) without abnormal findings: Secondary | ICD-10-CM

## 2014-09-04 ENCOUNTER — Other Ambulatory Visit: Payer: Self-pay | Admitting: Anesthesiology

## 2014-09-04 ENCOUNTER — Other Ambulatory Visit: Payer: PRIVATE HEALTH INSURANCE

## 2014-09-04 DIAGNOSIS — Z01419 Encounter for gynecological examination (general) (routine) without abnormal findings: Secondary | ICD-10-CM

## 2014-09-08 ENCOUNTER — Encounter: Payer: Self-pay | Admitting: Medical

## 2014-09-08 ENCOUNTER — Ambulatory Visit (INDEPENDENT_AMBULATORY_CARE_PROVIDER_SITE_OTHER): Payer: 59 | Admitting: Medical

## 2014-09-08 VITALS — BP 123/83 | HR 72 | Temp 98.0°F | Ht 65.5 in | Wt 180.0 lb

## 2014-09-08 DIAGNOSIS — N390 Urinary tract infection, site not specified: Secondary | ICD-10-CM | POA: Insufficient documentation

## 2014-09-08 DIAGNOSIS — J3089 Other allergic rhinitis: Secondary | ICD-10-CM

## 2014-09-08 DIAGNOSIS — R21 Rash and other nonspecific skin eruption: Secondary | ICD-10-CM

## 2014-09-08 DIAGNOSIS — K219 Gastro-esophageal reflux disease without esophagitis: Secondary | ICD-10-CM

## 2014-09-08 DIAGNOSIS — R3915 Urgency of urination: Secondary | ICD-10-CM

## 2014-09-08 DIAGNOSIS — J309 Allergic rhinitis, unspecified: Secondary | ICD-10-CM

## 2014-09-08 DIAGNOSIS — N3000 Acute cystitis without hematuria: Secondary | ICD-10-CM

## 2014-09-08 DIAGNOSIS — R822 Biliuria: Secondary | ICD-10-CM

## 2014-09-08 DIAGNOSIS — J0100 Acute maxillary sinusitis, unspecified: Secondary | ICD-10-CM

## 2014-09-08 LAB — POCT URINALYSIS DIPSTICK
Glucose, UA: NEGATIVE
Ketones, UA: NEGATIVE
Nitrite, UA: NEGATIVE
Protein, UA: 30
Spec Grav, UA: >=1.03
Urobilinogen, UA: 1
pH, UA: 5

## 2014-09-08 MED ORDER — BECLOMETHASONE DIPROPIONATE 40 MCG/ACT IN AERS: 2.0000 | INHALATION_SPRAY | Freq: Two times a day (BID) | RESPIRATORY_TRACT | Status: DC

## 2014-09-08 MED ORDER — ESOMEPRAZOLE MAGNESIUM 40 MG PO CPDR: DELAYED_RELEASE_CAPSULE | ORAL | Status: DC

## 2014-09-08 MED ORDER — MONTELUKAST SODIUM 10 MG PO TABS: ORAL_TABLET | ORAL | Status: DC

## 2014-09-08 MED ORDER — TRIAMCINOLONE ACETONIDE 0.1 % EX CREA: 1.0000 "application " | TOPICAL_CREAM | Freq: Two times a day (BID) | CUTANEOUS | Status: DC

## 2014-09-08 MED ORDER — SULFAMETHOXAZOLE-TMP DS 800-160 MG PO TABS: 1.0000 | ORAL_TABLET | Freq: Two times a day (BID) | ORAL | Status: DC

## 2014-09-08 MED ORDER — METHYLPREDNISOLONE ACETATE 80 MG/ML IJ SUSP
40.0000 mg | Freq: Once | INTRAMUSCULAR | Status: AC
  Administered 2014-09-08: 40 mg via INTRAMUSCULAR

## 2014-09-08 NOTE — Assessment & Plan Note (Signed)
Found in urine today. I sent message to Santiago Glad LPN to call pt and advise come in a week to get cmp.

## 2014-09-08 NOTE — Assessment & Plan Note (Addendum)
Possible early. Rx of bactrim pending urine culture. Presently treated for sinusitis as well with bactrim.

## 2014-09-08 NOTE — Assessment & Plan Note (Signed)
Hx of intermittent on her rt knee. Hx of psoriasis. End of exam she added would I rx cream. So I advised moisturize cream bid and tx of triamcinolone sent in.

## 2014-09-08 NOTE — Assessment & Plan Note (Signed)
Controlled. Refill nexium

## 2014-09-08 NOTE — Assessment & Plan Note (Signed)
Secondary to allergic rhinitis. Rx of bactrim Ds.

## 2014-09-08 NOTE — Assessment & Plan Note (Signed)
Pt will continue zyrtec, fluticasone, and singulair. Adding dep medrol today. Pt declined oral prednisone taper.

## 2014-09-08 NOTE — Progress Notes (Signed)
Subjective:    Patient ID: Melissa Middleton, female    DOB: [birth year], 58 y.o.   MRN: [id number]  HPI  Pt in for cough, congestion and runny nose. Pt states twice year usually  Fall and early spring gets sinus infections. Pt states nasal congestion for about a week and getting gradually worse. Pt gets allergy injections. She is on zyrtec and a nasal spray.(Recently has been on the nasal spray).  Pt wheezed a little bit last night. Pt not diabetic.  Pt used neb machine last night with albuterol and it did help. Pt is also on singulair.  Pt also  Pt had one day of frequent urination. Hesitant flow. Mild chills. Maybe mild low grade fever(from sinus or uti??). No vomiting. No back pain. No suprapubic pressure.  Gerd- Pt needs refill of nexium. Pt has a hiatal hernia.   Pt mentioned at end of exam rt knee area rash. She states history of psoriasis. She request rx of steroid cream. She states she had psoriais.          Review of Systems  Constitutional: Positive for fever, chills and fatigue.       Maybe fever.  HENT: Positive for postnasal drip, rhinorrhea, sinus pressure and sneezing. Negative for ear discharge, ear pain, facial swelling, sore throat and trouble swallowing.   Respiratory: Positive for cough and wheezing. Negative for chest tightness.        Last night cough.  Cardiovascular: Negative for chest pain and palpitations.  Gastrointestinal: Negative for nausea, abdominal pain, diarrhea, constipation, blood in stool, abdominal distention and rectal pain.       History of reflux. Needs refill of medication.  Genitourinary: Positive for frequency. Negative for dysuria, urgency, hematuria, flank pain, enuresis, difficulty urinating and pelvic pain.       Hesitant urine flow.  Musculoskeletal: Negative for back pain.  Skin:       Rt knee slight pretibial rash.  Hematological: Negative for adenopathy. Does not bruise/bleed easily.  Psychiatric/Behavioral: Negative.       Objective:   Physical Exam  Constitutional: She is oriented to person, place, and time. She appears well-developed and well-nourished. No distress.  HENT:  Head: Normocephalic and atraumatic.  Left Ear: External ear normal.  Nose: Nose normal.  Rt ear canal moderate wax.  Moderate red turbinates. Boggy.  PND present.  Maxillary- sinus pressure.  Eyes: Conjunctivae and EOM are normal. Pupils are equal, round, and reactive to light. Right eye exhibits no discharge. Left eye exhibits no discharge. No scleral icterus.  Neck: Normal range of motion. Neck supple. No JVD present. No tracheal deviation present. No thyromegaly present.  Cardiovascular: Normal rate, regular rhythm and normal heart sounds.  Exam reveals no gallop and no friction rub.   No murmur heard. Pulmonary/Chest: Effort normal and breath sounds normal. No stridor. No respiratory distress. She has no wheezes. She has no rales. She exhibits no tenderness.  Even unlabored.  Abdominal: Soft. Bowel sounds are normal. She exhibits no distension and no mass. There is no tenderness. There is no rebound and no guarding.  No suprapubic tenderness.  Musculoskeletal:  No cva tenderness.  Lymphadenopathy:    She has no cervical adenopathy.  Neurological: She is alert and oriented to person, place, and time. No cranial nerve deficit. Coordination normal.  Skin: She is not diaphoretic.  Rt knee- small area rt pretibial region faint pink rash. Area about 1cm in diameter.  Psychiatric: She has a normal mood and affect.  Her behavior is normal. Judgment and thought content normal.          Assessment & Plan:

## 2014-09-08 NOTE — Patient Instructions (Signed)
For your allergic rhinitis and probable sinusitis. I am adding depo medrol 40 mg im.(You requested this vs low dose taper steroid) You are on zyrtec, fluticasone and singulair so this would be the next step to treat your allergies. Please notify your allergist regarding use of the injection. Also will send bactrim ds to your pharmacy.  For possible uti, I wrote bactrim as well. Urine culture is pending.  For your wheezing. I am prescribing qvar. Use as directed. Continue albuterol neb treatment as well.  Follow up in 7-10 days or as needed.  Rx of triamcinolone for your rash and moisturize the rt knee.  Rx of nexium for your gerd.

## 2014-09-09 ENCOUNTER — Encounter: Payer: Self-pay | Admitting: Gynecology

## 2014-09-09 ENCOUNTER — Other Ambulatory Visit: Payer: Self-pay | Admitting: *Deleted

## 2014-09-09 DIAGNOSIS — E559 Vitamin D deficiency, unspecified: Secondary | ICD-10-CM

## 2014-09-09 MED ORDER — VITAMIN D (ERGOCALCIFEROL) 1.25 MG (50000 UNIT) PO CAPS: 50000.0000 [IU] | ORAL_CAPSULE | ORAL | Status: DC

## 2014-09-11 ENCOUNTER — Telehealth: Payer: Self-pay | Admitting: *Deleted

## 2014-09-11 NOTE — Telephone Encounter (Signed)
Pt called asking if she should also take vitamin d 2 along with the Rx for vitamin d 50,000 units. I explained to pt Dr.Fernandez only want her to take the Rx vitamin d 50,000 units for now until we recheck level. Pt will follow the above

## 2014-09-17 ENCOUNTER — Telehealth: Payer: Self-pay | Admitting: Medical

## 2014-09-17 NOTE — Telephone Encounter (Signed)
Looks like he is handling her vitamin d deficiency. I can't really say what needs to be done without knowing actual level. Also need actually dexa scan report. I see summary but would actually like report. She could ask gyn office to send our office both.

## 2014-09-17 NOTE — Telephone Encounter (Signed)
Patient notified of lab results. States she went to GYN who ordered labs and she has started on Vitamin D because hers was too low. Is there any thing else she needs to be tested for . Please advise.

## 2014-09-17 NOTE — Telephone Encounter (Signed)
Pt had a very low cont of e.coli. Only 9,000. Bactrim was on sensitivity. So 3 days should have be adequate. How is she?

## 2014-09-18 NOTE — Telephone Encounter (Signed)
Patient states all should be in her chart because she sees Dr. Jerilee Hoh who is affiliated with [facility]. There are Bone Density results in chart.

## 2014-09-21 ENCOUNTER — Telehealth: Payer: Self-pay | Admitting: Medical

## 2014-09-21 NOTE — Telephone Encounter (Signed)
Will send not to pt thru LPN regarding her bone density. Dexa scan ordered by gyn.

## 2014-09-22 NOTE — Telephone Encounter (Signed)
Called patient regarding Bone Denisty report. Per ES patient will need to consult Gyn regarding this because they ordered. Advised patient to refer to GYN left message on her answering machine.

## 2014-10-14 ENCOUNTER — Telehealth: Payer: Self-pay | Admitting: *Deleted

## 2014-10-14 ENCOUNTER — Ambulatory Visit: Payer: 59 | Admitting: Medical

## 2014-10-14 DIAGNOSIS — Z0289 Encounter for other administrative examinations: Secondary | ICD-10-CM

## 2014-10-14 NOTE — Telephone Encounter (Signed)
Pt did not show for appointment 10/14/2014 at 10am for "sinus issues still"

## 2014-10-20 ENCOUNTER — Encounter: Payer: Self-pay | Admitting: Gynecology

## 2014-11-13 ENCOUNTER — Other Ambulatory Visit: Payer: Self-pay | Admitting: Family

## 2014-11-17 NOTE — Telephone Encounter (Signed)
90 day supply of trazadone sent to pharmacy. Pt last seen by PCP in June and advised physical in August. If pt had physical with gynecologist she will be due for 6 month follow up in December. Please call pt to arrange appt.

## 2014-11-17 NOTE — Telephone Encounter (Signed)
Left message for patient to return my call.

## 2014-11-17 NOTE — Telephone Encounter (Signed)
Appointment scheduled.

## 2014-12-09 ENCOUNTER — Other Ambulatory Visit: Payer: Self-pay | Admitting: Gynecology

## 2014-12-09 NOTE — Telephone Encounter (Signed)
Rx called in to pharmacy. 

## 2014-12-10 ENCOUNTER — Encounter: Payer: Self-pay | Admitting: Family

## 2014-12-10 ENCOUNTER — Ambulatory Visit (INDEPENDENT_AMBULATORY_CARE_PROVIDER_SITE_OTHER): Payer: 59 | Admitting: Family

## 2014-12-10 VITALS — BP 123/76 | HR 81 | Temp 97.9°F | Ht 65.5 in | Wt 183.4 lb

## 2014-12-10 DIAGNOSIS — E559 Vitamin D deficiency, unspecified: Secondary | ICD-10-CM

## 2014-12-10 DIAGNOSIS — Z0000 Encounter for general adult medical examination without abnormal findings: Secondary | ICD-10-CM

## 2014-12-10 DIAGNOSIS — J0100 Acute maxillary sinusitis, unspecified: Secondary | ICD-10-CM

## 2014-12-10 LAB — CBC WITH DIFFERENTIAL/PLATELET
Basophils Absolute: 0 10*3/uL (ref 0.0–0.1)
Basophils Relative: 0.4 % (ref 0.0–3.0)
Eosinophils Absolute: 0.5 10*3/uL (ref 0.0–0.7)
Eosinophils Relative: 7.9 % — ABNORMAL HIGH (ref 0.0–5.0)
HCT: 38.7 % (ref 36.0–46.0)
Hemoglobin: 12.4 g/dL (ref 12.0–15.0)
Lymphocytes Relative: 28.9 % (ref 12.0–46.0)
Lymphs Abs: 1.7 10*3/uL (ref 0.7–4.0)
MCHC: 32 g/dL (ref 30.0–36.0)
MCV: 85.8 fl (ref 78.0–100.0)
Monocytes Absolute: 0.6 10*3/uL (ref 0.1–1.0)
Monocytes Relative: 10.2 % (ref 3.0–12.0)
Neutro Abs: 3.1 10*3/uL (ref 1.4–7.7)
Neutrophils Relative %: 52.6 % (ref 43.0–77.0)
Platelets: 266 10*3/uL (ref 150.0–400.0)
RBC: 4.51 Mil/uL (ref 3.87–5.11)
RDW: 14.3 % (ref 11.5–15.5)
WBC: 5.9 10*3/uL (ref 4.0–10.5)

## 2014-12-10 LAB — URINALYSIS, ROUTINE W REFLEX MICROSCOPIC
Bilirubin Urine: NEGATIVE
Hgb urine dipstick: NEGATIVE
Ketones, ur: NEGATIVE
Leukocytes, UA: NEGATIVE
Nitrite: NEGATIVE
RBC / HPF: NONE SEEN (ref 0–?)
Specific Gravity, Urine: <=1.005 — AB (ref 1.000–1.030)
Total Protein, Urine: NEGATIVE
Urine Glucose: NEGATIVE
Urobilinogen, UA: 0.2 (ref 0.0–1.0)
WBC, UA: NONE SEEN (ref 0–?)
pH: 6.5 (ref 5.0–8.0)

## 2014-12-10 LAB — HEPATIC FUNCTION PANEL
ALT: 20 U/L (ref 0–35)
AST: 18 U/L (ref 0–37)
Albumin: 4 g/dL (ref 3.5–5.2)
Alkaline Phosphatase: 72 U/L (ref 39–117)
Bilirubin, Direct: 0 mg/dL (ref 0.0–0.3)
Total Bilirubin: 0.4 mg/dL (ref 0.2–1.2)
Total Protein: 7.4 g/dL (ref 6.0–8.3)

## 2014-12-10 LAB — BASIC METABOLIC PANEL
BUN: 14 mg/dL (ref 6–23)
CO2: 26 mEq/L (ref 19–32)
Calcium: 9.2 mg/dL (ref 8.4–10.5)
Chloride: 103 mEq/L (ref 96–112)
Creatinine, Ser: 0.9 mg/dL (ref 0.4–1.2)
GFR: 67.31 mL/min (ref >60.00)
Glucose, Bld: 91 mg/dL (ref 70–99)
Potassium: 4.1 mEq/L (ref 3.5–5.1)
Sodium: 135 mEq/L (ref 135–145)

## 2014-12-10 LAB — LIPID PANEL
Cholesterol: 188 mg/dL (ref 0–200)
HDL: 52.4 mg/dL (ref >39.00)
LDL Cholesterol: 119 mg/dL — ABNORMAL HIGH (ref 0–99)
NonHDL: 135.6
Total CHOL/HDL Ratio: 4
Triglycerides: 85 mg/dL (ref 0.0–149.0)
VLDL: 17 mg/dL (ref 0.0–40.0)

## 2014-12-10 LAB — TSH: TSH: 1.03 u[IU]/mL (ref 0.35–4.50)

## 2014-12-10 LAB — VITAMIN D 25 HYDROXY (VIT D DEFICIENCY, FRACTURES): VITD: 45.25 ng/mL (ref 30.00–100.00)

## 2014-12-10 MED ORDER — DOXYCYCLINE HYCLATE 100 MG PO TABS: 100.0000 mg | ORAL_TABLET | Freq: Two times a day (BID) | ORAL | Status: DC

## 2014-12-10 NOTE — Patient Instructions (Addendum)
Please complete your lab work piror to leaving. Try to work on getting regular exercise and on weight loss. Start augmentin for sinus. Add mucinex to help break up the congestion- 600mg  twice daily. Call if symptoms worsen or if symptoms do not improve.  Follow up in 6 months.

## 2014-12-10 NOTE — Progress Notes (Signed)
Subjective:    Patient ID: Melissa Middleton, female    DOB: [birth year], 58 y.o.   MRN: [id number]  HPI  Melissa Middleton is a 58 yr old female who presents today for cpx.  Immunizations: tetanus and flu shot up to date- wants to wait until 60 for shingles vaccine. Diet: reports diet is healthy Exercise: no Colonoscopy: 2008- due 2018 Dexa:8/15 Pap Smear: up to date Mammogram: 6/15  Reports that she started bactrim 5 days ago for "sinus."  Had 1 week of sinus pressure/drainage prior to starting the bactrim (was her daughter's prescription).  Notes slow improvement in her symptoms.    Vitamin D- just finished the 12 weeks of vit D.     Review of Systems  Constitutional:       Wt Readings from Last 3 Encounters: 12/10/14 : 183 lb 6.4 oz (83.19 kg) 09/08/14 : 180 lb (81.647 kg) 06/27/14 : 181 lb (82.101 kg)   HENT: Negative for hearing loss.   Eyes: Negative for visual disturbance.  Cardiovascular: Negative for leg swelling.  Gastrointestinal: Negative for nausea, vomiting and diarrhea.  Genitourinary: Negative for dysuria and frequency.  Musculoskeletal: Negative for myalgias and arthralgias.  Skin:       Reports mild psoriasis  Neurological: Negative for headaches.  Psychiatric/Behavioral:       Denies depression/anxiety Last xanax dose was a few days ago, just takes part of a pill   Past Medical History  Diagnosis Date  . Premenstrual tension syndromes     gyn uses alprazolam and spironolactone prn ofr treatment  . Benign neoplasm of stomach     gastric polyps  . Stricture and stenosis of esophagus   . Diaphragmatic hernia without mention of obstruction or gangrene   . Diverticulosis of colon (without mention of hemorrhage)   . Unspecified asthma(493.90)   . Esophageal reflux     see GI  . Bronchitis     recurrent  . Allergic rhinitis   . Sinusitis     recurrent  . Osteoarthritis     sees ortho-has had steroid shot in knee  . Post menopausal problems    symptoms  . Insomnia   . Anxiety   . Psoriasis   . Vitamin D deficiency     History   Social History  . Marital Status: Married    Spouse Name: N/A    Number of Children: 2  . Years of Education: N/A   Occupational History  .      Administrative work   Social History Main Topics  . Smoking status: Never Smoker   . Smokeless tobacco: Never Used  . Alcohol Use: No  . Drug Use: No  . Sexual Activity: Yes   Other Topics Concern  . Not on file   Social History Narrative    Past Surgical History  Procedure Laterality Date  . Tubal ligation  2004    with ablation  . Nasal sinus surgery    . Total knee arthroplasty  11/2009    Right knee  . Cesarean section    . Endometrial ablation      Family History  Problem Relation Age of Onset  . Arthritis Other   . Hypertension Mother   . Diabetes Father   . Hypertension Father   . Cancer Sister     MELANOMA  . Hypertension Maternal Grandfather   . Diabetes Paternal Grandmother   . Migraines Neg Hx     Allergies  Allergen Reactions  .  Macrodantin [Nitrofurantoin Macrocrystal] Itching and Rash  . Cefuroxime Axetil Rash and Other (See Comments)    REACTION: red face/rash  . Penicillins Rash    Current Outpatient Prescriptions on File Prior to Visit  Medication Sig Dispense Refill  . albuterol (VENTOLIN HFA) 108 (90 BASE) MCG/ACT inhaler Inhale 2 puffs into the lungs every 6 (six) hours as needed. For wheezing 1 Inhaler 3  . ALPRAZolam (XANAX) 0.25 MG tablet TAKE 1 TABLET BY MOUTH AT BEDTIME AS NEEDED FOR ANXIETY 90 tablet 1  . beclomethasone (QVAR) 40 MCG/ACT inhaler Inhale 2 puffs into the lungs 2 (two) times daily. 1 Inhaler 12  . cetirizine (ZYRTEC) 10 MG tablet Take 10 mg by mouth daily.      Marland Kitchen esomeprazole (NEXIUM) 40 MG capsule TAKE 1 CAPSULE (40 MG TOTAL) BY MOUTH DAILY BEFORE BREAKFAST. 90 capsule 1  . fluticasone (FLONASE) 50 MCG/ACT nasal spray 2 sprays each nostril once daily     . Fluticasone-Salmeterol  (ADVAIR) 250-50 MCG/DOSE AEPB Inhale 1 puff into the lungs 2 (two) times daily. 60 each 2  . montelukast (SINGULAIR) 10 MG tablet TAKE 1 TABLET (10 MG TOTAL) BY MOUTH AT BEDTIME. 90 tablet 1  . NONFORMULARY OR COMPOUNDED ITEM Estradiol .02% 1 ML Prefilled Applicator Sig: apply vaginally twice a week #90 Day Supply with 4 refills 1 each 4  . spironolactone (ALDACTONE) 25 MG tablet Take 1 tablet (25 mg total) by mouth daily as needed. 90 tablet 4  . sulfamethoxazole-trimethoprim (BACTRIM DS) 800-160 MG per tablet Take 1 tablet by mouth 2 (two) times daily. 14 tablet 0  . traZODone (DESYREL) 100 MG tablet TAKE 1 TABLET (100 MG TOTAL) BY MOUTH AT BEDTIME. 90 tablet 0  . triamcinolone cream (KENALOG) 0.1 % Apply 1 application topically 2 (two) times daily. 30 g 0  . Vitamin D, Ergocalciferol, (DRISDOL) 50000 UNITS CAPS capsule Take 1 capsule (50,000 Units total) by mouth every 7 (seven) days. 12 capsule 0   No current facility-administered medications on file prior to visit.    BP 123/76 mmHg  Pulse 81  Temp(Src) 97.9 F (36.6 C) (Oral)  Ht 5' 5.5" (1.664 m)  Wt 183 lb 6.4 oz (83.19 kg)  BMI 30.04 kg/m2  SpO2 97%       Objective:   Physical Exam  Constitutional: She is oriented to person, place, and time. She appears well-developed and well-nourished. No distress.  HENT:  Head: Normocephalic and atraumatic.  Right Ear: Tympanic membrane and ear canal normal.  Left Ear: Tympanic membrane and ear canal normal.  Mouth/Throat: No oropharyngeal exudate or posterior oropharyngeal edema.  Cardiovascular: Normal rate and regular rhythm.   No murmur heard. Pulmonary/Chest: Effort normal and breath sounds normal. No respiratory distress. She has no wheezes. She has no rales. She exhibits no tenderness.  Abdominal: Soft. Bowel sounds are normal.  Musculoskeletal: She exhibits no edema.  Lymphadenopathy:    She has no cervical adenopathy.  Neurological: She is alert and oriented to person,  place, and time.  Skin: Skin is warm and dry.  Psychiatric: She has a normal mood and affect. Her behavior is normal. Judgment and thought content normal.          Assessment & Plan:

## 2014-12-10 NOTE — Progress Notes (Signed)
Pre visit review using our clinic review tool, if applicable. No additional management support is needed unless otherwise documented below in the visit note. 

## 2014-12-10 NOTE — Assessment & Plan Note (Signed)
Discussed healthy diet, exercise, weight loss.  

## 2014-12-10 NOTE — Assessment & Plan Note (Signed)
rx with doxy (she is allergic to penicillin).

## 2014-12-13 ENCOUNTER — Other Ambulatory Visit: Payer: Self-pay | Admitting: Family

## 2014-12-13 ENCOUNTER — Encounter: Payer: Self-pay | Admitting: Family

## 2014-12-13 DIAGNOSIS — E559 Vitamin D deficiency, unspecified: Secondary | ICD-10-CM

## 2014-12-13 MED ORDER — CHOLECALCIFEROL 75 MCG (3000 UT) PO TABS: 1.0000 | ORAL_TABLET | Freq: Every day | ORAL | Status: DC

## 2014-12-15 ENCOUNTER — Telehealth: Payer: Self-pay | Admitting: Family

## 2014-12-15 NOTE — Telephone Encounter (Signed)
Pt inquiring about lab results taken 12/10/2014

## 2014-12-15 NOTE — Telephone Encounter (Signed)
Pt was informed that Per Melissa----Vit D looks good. Stop weekly supplement and start otc vit D 3000 units once daily. Cholesterol is improved. Other labs look good. Hope you had a nice Christmas!   Pt stated understanding.  She stated that she continues not to feel well, but has not gotten any worse.  She said that she would call back to schedule another appointment if her symptoms failed to improve.

## 2014-12-29 NOTE — Telephone Encounter (Signed)
Please contact pt re: unread mychart message. 

## 2014-12-29 NOTE — Telephone Encounter (Signed)
Left detailed message on cell# and for pt to call if any questions.

## 2015-05-06 ENCOUNTER — Other Ambulatory Visit: Payer: Self-pay | Admitting: Medical

## 2015-06-08 ENCOUNTER — Telehealth: Payer: Self-pay | Admitting: Family

## 2015-06-08 NOTE — Telephone Encounter (Signed)
Caller name: Luretta Everly Relationship to patient: self Can be reached: [phone] Pharmacy:  Reason for call: Pt states that she received a letter for a $50 fee from October. She is quite upset and states that this is an exorbitant fee and that she is not going to pay it. She said if she missed an appt it was because we didn't give her a card or call and remind her. Advised pt that the appt from October 2015 was for a sinus infection that she had called in for and was scheduled for the following day. Pt was very upset and stated she remembers calling to cancel that appt and went to the urgent care. She said that if the fee is not waived that she will change doctors and has no problem with that. Pt said to call and leave her a voicemail letting her know if we are going to waive the fee. If we don't then she will not be scheduling her yearly appt.

## 2015-06-09 NOTE — Telephone Encounter (Signed)
Pt called again wanting to speak with the office manager. States to call [phone].

## 2015-06-11 NOTE — Telephone Encounter (Signed)
Will waive no-show fee, left message explaining to patient, asked her to give me a call if she had any further questions

## 2015-06-24 ENCOUNTER — Telehealth: Payer: Self-pay | Admitting: Family

## 2015-06-24 ENCOUNTER — Other Ambulatory Visit: Payer: Self-pay | Admitting: Gynecology

## 2015-06-24 DIAGNOSIS — Z1231 Encounter for screening mammogram for malignant neoplasm of breast: Secondary | ICD-10-CM

## 2015-06-24 MED ORDER — ESOMEPRAZOLE MAGNESIUM 40 MG PO CPDR: DELAYED_RELEASE_CAPSULE | ORAL | Status: DC

## 2015-06-24 NOTE — Telephone Encounter (Signed)
90 day supply sent. Notified pt. 

## 2015-06-24 NOTE — Telephone Encounter (Signed)
Relation to pt: self  Call back number: [phone] Pharmacy: [id number] - [place], [place] - [address]  Reason for call:  Pt requesting a 90 day supply of esomeprazole (NEXIUM) 40 MG capsule. Pt states 90 day supply only due to medication being cheaper. Pt scheduled follow up for 07/06/2015

## 2015-07-02 ENCOUNTER — Other Ambulatory Visit: Payer: Self-pay | Admitting: Family

## 2015-07-06 ENCOUNTER — Encounter: Payer: Self-pay | Admitting: Family

## 2015-07-06 ENCOUNTER — Ambulatory Visit (INDEPENDENT_AMBULATORY_CARE_PROVIDER_SITE_OTHER): Payer: 59 | Admitting: Family

## 2015-07-06 VITALS — BP 120/80 | HR 77 | Temp 98.1°F | Resp 16 | Ht 66.0 in | Wt 192.4 lb

## 2015-07-06 DIAGNOSIS — J45909 Unspecified asthma, uncomplicated: Secondary | ICD-10-CM

## 2015-07-06 DIAGNOSIS — E559 Vitamin D deficiency, unspecified: Secondary | ICD-10-CM | POA: Diagnosis not present

## 2015-07-06 MED ORDER — ALBUTEROL SULFATE HFA 108 (90 BASE) MCG/ACT IN AERS: 2.0000 | INHALATION_SPRAY | Freq: Four times a day (QID) | RESPIRATORY_TRACT | Status: DC | PRN

## 2015-07-06 MED ORDER — ESOMEPRAZOLE MAGNESIUM 40 MG PO CPDR: DELAYED_RELEASE_CAPSULE | ORAL | Status: DC

## 2015-07-06 MED ORDER — MOMETASONE FUROATE 220 MCG/INH IN AEPB: 2.0000 | INHALATION_SPRAY | Freq: Every day | RESPIRATORY_TRACT | Status: DC

## 2015-07-06 MED ORDER — MONTELUKAST SODIUM 10 MG PO TABS: 10.0000 mg | ORAL_TABLET | Freq: Every day | ORAL | Status: DC

## 2015-07-06 NOTE — Patient Instructions (Signed)
Start asmanex. Follow up in 3 months.

## 2015-07-06 NOTE — Progress Notes (Signed)
Pre visit review using our clinic review tool, if applicable. No additional management support is needed unless otherwise documented below in the visit note. 

## 2015-07-06 NOTE — Progress Notes (Addendum)
Subjective:    Patient ID: Melissa Middleton, female    DOB: December 13, 1956, 59 y.o.   MRN: [id number]  HPI  [name] is a 59 yr old female who presents today for follow up.  1) Asthma- reports asthma has been well controlled.  Using singulair.  She stopped advair, but would like to resume asmanex.   2) Vit D deficiency- she is taking vit D 3000.    3) L ear- fullness, mild dizziness with head turning.     Review of Systems See HPI  Past Medical History  Diagnosis Date  . Premenstrual tension syndromes     gyn uses alprazolam and spironolactone prn ofr treatment  . Benign neoplasm of stomach     gastric polyps  . Stricture and stenosis of esophagus   . Diaphragmatic hernia without mention of obstruction or gangrene   . Diverticulosis of colon (without mention of hemorrhage)   . Unspecified asthma(493.90)   . Esophageal reflux     see GI  . Bronchitis     recurrent  . Allergic rhinitis   . Sinusitis     recurrent  . Osteoarthritis     sees ortho-has had steroid shot in knee  . Post menopausal problems     symptoms  . Insomnia   . Anxiety   . Psoriasis   . Vitamin D deficiency     History   Social History  . Marital Status: Married    Spouse Name: N/A  . Number of Children: 2  . Years of Education: N/A   Occupational History  .      Administrative work   Social History Main Topics  . Smoking status: Never Smoker   . Smokeless tobacco: Never Used  . Alcohol Use: No  . Drug Use: No  . Sexual Activity: Yes   Other Topics Concern  . Not on file   Social History Narrative    Past Surgical History  Procedure Laterality Date  . Tubal ligation  2004    with ablation  . Nasal sinus surgery    . Total knee arthroplasty  11/2009    Right knee  . Cesarean section    . Endometrial ablation      Family History  Problem Relation Age of Onset  . Arthritis Other   . Hypertension Mother   . Diabetes Father   . Hypertension Father   . Cancer Sister       MELANOMA  . Hypertension Maternal Grandfather   . Diabetes Paternal Grandmother   . Migraines Neg Hx     Allergies  Allergen Reactions  . Macrodantin [Nitrofurantoin Macrocrystal] Itching and Rash  . Cefuroxime Axetil Rash and Other (See Comments)    REACTION: red face/rash  . Penicillins Rash    Current Outpatient Prescriptions on File Prior to Visit  Medication Sig Dispense Refill  . ALPRAZolam (XANAX) 0.25 MG tablet TAKE 1 TABLET BY MOUTH AT BEDTIME AS NEEDED FOR ANXIETY 90 tablet 1  . cetirizine (ZYRTEC) 10 MG tablet Take 10 mg by mouth daily.      . Cholecalciferol 3000 UNITS TABS Take 1 tablet by mouth daily.    Marland Kitchen esomeprazole (NEXIUM) 40 MG capsule TAKE 1 CAPSULE (40 MG TOTAL) BY MOUTH DAILY BEFORE BREAKFAST. 90 capsule 0  . fluticasone (FLONASE) 50 MCG/ACT nasal spray 2 sprays each nostril once daily     . Fluticasone-Salmeterol (ADVAIR) 250-50 MCG/DOSE AEPB Inhale 1 puff into the lungs 2 (two) times daily. Gage  each 2  . montelukast (SINGULAIR) 10 MG tablet TAKE 1 TABLET (10 MG TOTAL) BY MOUTH AT BEDTIME. 90 tablet 1  . NONFORMULARY OR COMPOUNDED ITEM Estradiol .02% 1 ML Prefilled Applicator Sig: apply vaginally twice a week #90 Day Supply with 4 refills 1 each 4  . spironolactone (ALDACTONE) 25 MG tablet Take 1 tablet (25 mg total) by mouth daily as needed. 90 tablet 4  . traZODone (DESYREL) 100 MG tablet TAKE 1 TABLET (100 MG TOTAL) BY MOUTH AT BEDTIME. 90 tablet 0  . triamcinolone cream (KENALOG) 0.1 % Apply 1 application topically 2 (two) times daily. 30 g 0  . [DISCONTINUED] albuterol (VENTOLIN HFA) 108 (90 BASE) MCG/ACT inhaler Inhale 2 puffs into the lungs every 6 (six) hours as needed. For wheezing 1 Inhaler 3  . beclomethasone (QVAR) 40 MCG/ACT inhaler Inhale 2 puffs into the lungs 2 (two) times daily. (Patient not taking: Reported on 07/06/2015) 1 Inhaler 12   No current facility-administered medications on file prior to visit.    BP 120/80 mmHg  Pulse 77   Temp(Src) 98.1 F (36.7 C) (Oral)  Resp 16  Ht 5\' 6"  (1.676 m)  Wt 192 lb 6.4 oz (87.272 kg)  BMI 31.07 kg/m2  SpO2 96%       Objective:   Physical Exam  Constitutional: She is oriented to person, place, and time. She appears well-developed and well-nourished. No distress.  HENT:  Right Ear: Tympanic membrane and ear canal normal.  Left Ear: Tympanic membrane and ear canal normal.  Cardiovascular: Normal rate and regular rhythm.   No murmur heard. Pulmonary/Chest: Effort normal and breath sounds normal. No respiratory distress. She has no wheezes. She has no rales. She exhibits no tenderness.  Neurological: She is alert and oriented to person, place, and time.  Skin: Skin is warm and dry.  Psychiatric: She has a normal mood and affect. Her behavior is normal. Judgment and thought content normal.          Assessment & Plan:

## 2015-07-12 NOTE — Assessment & Plan Note (Signed)
Vit d level was normal in December.  Continue vit d supplement.

## 2015-07-12 NOTE — Assessment & Plan Note (Signed)
Rx asmanex, continue singulari, albuterol prn.

## 2015-07-16 ENCOUNTER — Ambulatory Visit (HOSPITAL_COMMUNITY)
Admission: RE | Admit: 2015-07-16 | Discharge: 2015-07-16 | Disposition: A | Discharge status: Home / Self Care | Payer: 59 | Source: Ambulatory Visit | Attending: Gynecology | Admitting: Gynecology

## 2015-07-16 DIAGNOSIS — Z1231 Encounter for screening mammogram for malignant neoplasm of breast: Secondary | ICD-10-CM

## 2015-07-24 ENCOUNTER — Other Ambulatory Visit: Payer: Self-pay | Admitting: Gynecology

## 2015-08-05 ENCOUNTER — Encounter: Payer: Self-pay | Admitting: Gynecology

## 2015-08-05 ENCOUNTER — Other Ambulatory Visit (HOSPITAL_COMMUNITY)
Admission: RE | Admit: 2015-08-05 | Discharge: 2015-08-05 | Disposition: A | Discharge status: Home / Self Care | Payer: 59 | Source: Ambulatory Visit | Attending: Gynecology | Admitting: Gynecology

## 2015-08-05 ENCOUNTER — Ambulatory Visit (INDEPENDENT_AMBULATORY_CARE_PROVIDER_SITE_OTHER): Payer: PRIVATE HEALTH INSURANCE | Admitting: Gynecology

## 2015-08-05 VITALS — BP 128/80 | Ht 66.0 in | Wt 190.0 lb

## 2015-08-05 DIAGNOSIS — F4322 Adjustment disorder with anxiety: Secondary | ICD-10-CM | POA: Diagnosis not present

## 2015-08-05 DIAGNOSIS — M4120 Other idiopathic scoliosis, site unspecified: Secondary | ICD-10-CM | POA: Diagnosis not present

## 2015-08-05 DIAGNOSIS — Z1151 Encounter for screening for human papillomavirus (HPV): Secondary | ICD-10-CM | POA: Insufficient documentation

## 2015-08-05 DIAGNOSIS — E8770 Fluid overload, unspecified: Secondary | ICD-10-CM

## 2015-08-05 DIAGNOSIS — Z01411 Encounter for gynecological examination (general) (routine) with abnormal findings: Secondary | ICD-10-CM | POA: Insufficient documentation

## 2015-08-05 DIAGNOSIS — Z8639 Personal history of other endocrine, nutritional and metabolic disease: Secondary | ICD-10-CM | POA: Diagnosis not present

## 2015-08-05 DIAGNOSIS — Z01419 Encounter for gynecological examination (general) (routine) without abnormal findings: Secondary | ICD-10-CM

## 2015-08-05 DIAGNOSIS — N952 Postmenopausal atrophic vaginitis: Secondary | ICD-10-CM

## 2015-08-05 DIAGNOSIS — R609 Edema, unspecified: Secondary | ICD-10-CM | POA: Insufficient documentation

## 2015-08-05 MED ORDER — ALPRAZOLAM 0.25 MG PO TABS: 0.2500 mg | ORAL_TABLET | Freq: Every evening | ORAL | Status: DC | PRN

## 2015-08-05 NOTE — Progress Notes (Signed)
Melissa Middleton 1956-04-08 [id number]   History:    59 y.o.  for annual gyn exam with complaint of vaginal dryness. Patient has been followed by her chiropractor and is scheduled to see Dr. Nelva Bush orthopedic surgeons as result of her symptomatic scoliosis. Patient suffers from anxiety for which she takes Xanax 0.25 mg when necessary daily. Patient passes had history vitamin D deficiency and wants corrected she has done well he is not complaining of any titers or muscle fatigue. She takes vitamin D 2000 units daily. 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 Ever since then her Pap smears been normal. Her primary physician is Dr. Elizebeth Koller who has been doing her lab work. Patient's last colonoscopy was normal in 2008 and her last bone density study was normal in 2015.  Past medical history,surgical history, family history and social history were all reviewed and documented in the EPIC chart.  Gynecologic History No LMP recorded. Patient has had an ablation. Contraception: post menopausal status Last Pap: 2013. Results were: normal Last mammogram: 2016. Results were: normal  Obstetric History OB History  Gravida Para Term Preterm AB SAB TAB Ectopic Multiple Living  2 2 2       2     # Outcome Date GA Lbr Len/2nd Weight Sex Delivery Anes PTL Lv  2 Term     M CS-Unspec  N Y  1 Term     F Vag-Spont  N Y       ROS: A ROS was performed and pertinent positives and negatives are included in the history.  GENERAL: No fevers or chills. HEENT: No change in vision, no earache, sore throat or sinus congestion. NECK: No pain or stiffness. CARDIOVASCULAR: No chest pain or pressure. No palpitations. PULMONARY: No shortness of breath, cough or wheeze. GASTROINTESTINAL: No abdominal pain, nausea, vomiting or diarrhea, melena or bright red blood per rectum. GENITOURINARY: No  urinary frequency, urgency, hesitancy or dysuria. MUSCULOSKELETAL: No joint or muscle pain, no back pain, no recent trauma. DERMATOLOGIC: No rash, no itching, no lesions. ENDOCRINE: No polyuria, polydipsia, no heat or cold intolerance. No recent change in weight. HEMATOLOGICAL: No anemia or easy bruising or bleeding. NEUROLOGIC: No headache, seizures, numbness, tingling or weakness. PSYCHIATRIC: No depression, no loss of interest in normal activity or change in sleep pattern.     Exam: chaperone present  BP 128/80 mmHg  Ht 5\' 6"  (1.676 m)  Wt 190 lb (86.183 kg)  BMI 30.68 kg/m2  Body mass index is 30.68 kg/(m^2).  General appearance : Well developed well nourished female. No acute distress HEENT: Eyes: no retinal hemorrhage or exudates,  Neck supple, trachea midline, no carotid bruits, no thyroidmegaly Lungs: Clear to auscultation, no rhonchi or wheezes, or rib retractions  Heart: Regular rate and rhythm, no murmurs or gallops Breast:Examined in sitting and supine position were symmetrical in appearance, no palpable masses or tenderness,  no skin retraction, no nipple inversion, no nipple discharge, no skin discoloration, no axillary or supraclavicular lymphadenopathy Abdomen: no palpable masses or tenderness, no rebound or guarding Extremities: no edema or skin discoloration or tenderness  Pelvic:  Bartholin, Urethra, Skene Glands: Within normal limits             Vagina: No gross lesions or discharge  Cervix: No gross lesions or discharge  Uterus  anteverted, normal size, shape and consistency, non-tender and  mobile  Adnexa  Without masses or tenderness  Anus and perineum  normal   Rectovaginal  normal sphincter tone without palpated masses or tenderness             Hemoccult cards will be provided     Assessment/Plan:  59 y.o. female for annual exam with vaginal atrophy. Vaginal estrogen prescription to apply twice a week was provided as well as for Xanax 0.25 mg to take when  necessary. She does not need a refill for her Aldactone. Because of her history vitamin D deficiency will check her vitamin D level today. Her PCP will be doing the rest of her blood work. She will submit to the office fecal Hemoccult cards for testing.   Terrance Mass MD, 5:07 PM 08/05/2015

## 2015-08-05 NOTE — Patient Instructions (Signed)
Scoliosis  Scoliosis is the name given to a spine that curves sideways. Scoliosis can cause twisting of your shoulders, hips, chest, back, and rib cage.   CAUSES   The cause of scoliosis is not always known. It may be caused by a birth defect or by a disease that can cause muscular dysfunction and imbalance, such as cerebral palsy and muscular dystrophy.   RISK FACTORS  Having a disease that causes muscle disease or dysfunction.  SIGNS AND SYMPTOMS  Scoliosis often has no signs or symptoms. If they are present, they may include:  · Unequal size of one body side compared to the other (asymmetry).  · Visible curvature of the spine.  · Pain. The pain may limit physical activity.  · Shortness of breath.  · Bowel or bladder issues.  DIAGNOSIS  A skilled health care provider will perform an evaluation. This will involve:  · Taking your history.  · Performing a physical examination.  · Performing a neurological exam to detect nerve or muscle function loss.  · Range of motion studies on the spine.  · X-rays.  An MRI may also be obtained.  TREATMENT   Treatment varies depending on the nature, extent, and severity of the disease. If the curvature is not great, you may need only observation. A brace may be used to prevent scoliosis from progressing. A brace may also be needed during growth spurts. Physical therapy may be of benefit. Surgery may be required.   HOME CARE INSTRUCTIONS   · Your health care provider may suggest exercises to strengthen your muscles. Perform them as directed.  · Ask your health care provider before participating in any sports.    · If you have been prescribed an orthopedic brace, wear it as instructed by your health care provider.   SEEK MEDICAL CARE IF:  Your brace causes the skin to become sore (chafe) or is uncomfortable.   SEEK IMMEDIATE MEDICAL CARE IF:  · You have back pain that is not relieved by the medicines prescribed by your health care provider.    · Your legs feel weak or you lose  function in your legs.  · You lose some bowel or bladder control.    Document Released: 12/02/2000 Document Revised: 12/10/2013 Document Reviewed: 08/12/2013  Exit[employer]® Patient Information ©2015 [employer]. This information is not intended to replace advice given to you by your health care provider. Make sure you discuss any questions you have with your health care provider.

## 2015-08-07 LAB — CYTOLOGY - PAP

## 2015-08-11 ENCOUNTER — Telehealth: Payer: Self-pay

## 2015-08-11 ENCOUNTER — Other Ambulatory Visit: Payer: Self-pay | Admitting: Gynecology

## 2015-08-11 DIAGNOSIS — E559 Vitamin D deficiency, unspecified: Secondary | ICD-10-CM

## 2015-08-11 MED ORDER — VITAMIN D (ERGOCALCIFEROL) 1.25 MG (50000 UNIT) PO CAPS: 50000.0000 [IU] | ORAL_CAPSULE | ORAL | Status: DC

## 2015-08-11 NOTE — Telephone Encounter (Signed)
In that case once we get her vitamin D level back to normal instead of her taking vitamin D3 2000 units daily we will put her then on 50,000 units every other week thereafter

## 2015-08-11 NOTE — Telephone Encounter (Signed)
-----   Message from Terrance Mass, MD sent at 08/10/2015  4:42 PM EDT ----- Please inform patient that she has vitamin D deficiency. Call in prescription for vitamin D 50,000 units. I would like her to take 1 tablet every weekly for 12 weeks. Upon completion of the 12 weeks I would like her to begin taking vitamin D3 2000 units every day thereafter

## 2015-08-11 NOTE — Telephone Encounter (Signed)
Like always yes

## 2015-08-11 NOTE — Telephone Encounter (Signed)
So do you want to recheck her level after 12 weeks on 50,000units weekly? You did not indicate that in your original result note.

## 2015-08-11 NOTE — Telephone Encounter (Signed)
Dr. JF-Patient said last year her Vit D level was 22 and she had been taking 2000 units Vit D prior to that. She said you had her take 50000 units x 12 weeks and she felt remarkably better and even thanked you for it.  She said that she has since taken 2000 units Vit D every day and now it is low again.  She asked if after she finishes this round of 93734 units if perhaps she should take more than 2000 units daily since that does not seem to be maintaining her at a normal level.

## 2015-08-11 NOTE — Telephone Encounter (Signed)
Patient informed that she will need to return for VIT d level check. ONce level to normal will go to 50,000 units Vit D qoweek.  Order placed. Patient will call for lab appt at end of Rx.

## 2015-08-14 ENCOUNTER — Other Ambulatory Visit: Payer: Self-pay | Admitting: Family

## 2015-08-14 NOTE — Telephone Encounter (Signed)
Refill trazadone. rx sent in.

## 2015-08-14 NOTE — Telephone Encounter (Signed)
Trazodone. Melissa's patient  Last seen 07/06/15 and filled 11/17/14 #90  Please advise     KP

## 2015-08-17 ENCOUNTER — Telehealth: Payer: Self-pay | Admitting: Family

## 2015-08-17 MED ORDER — TRAZODONE HCL 100 MG PO TABS: ORAL_TABLET | ORAL | Status: DC

## 2015-08-17 NOTE — Telephone Encounter (Signed)
She was given 90 day supply.  OK to add 1 refill.

## 2015-08-17 NOTE — Telephone Encounter (Signed)
Pt just got her refill on trazodone. She said it has no refills and she is only to f/u every 6 months. She wants to know why she doesn't get refills to last until then. She wants to get 1 year on prescriptions ideally, but at least every 6 months.

## 2015-08-17 NOTE — Telephone Encounter (Signed)
Spoke with pharmacy. They are unable to add additional refills to current Rx. Sent another 90 day Rx with note to place on hold for November refill and advised pt to speak with pharmacist for her next refill.

## 2015-09-14 ENCOUNTER — Telehealth: Payer: Self-pay | Admitting: Medical

## 2015-09-14 NOTE — Telephone Encounter (Signed)
Looked like pt never got cmp that I requested she get due to bilirubin in her urine. Will you call her and remind/ask her to come in and get that done.

## 2015-09-14 NOTE — Telephone Encounter (Signed)
Pt had some biliruin in her urine and I wanted to get cmp. It looks like she did not get this done. Will you call pt and ask her to come in to get that checked.

## 2015-09-15 NOTE — Telephone Encounter (Signed)
Spoke with pt and she already got a urine sample at Urgent Care recently. Pt does not feel that she needs to come in again due to having symptoms.

## 2015-09-29 ENCOUNTER — Ambulatory Visit (INDEPENDENT_AMBULATORY_CARE_PROVIDER_SITE_OTHER): Payer: PRIVATE HEALTH INSURANCE

## 2015-09-29 DIAGNOSIS — J309 Allergic rhinitis, unspecified: Secondary | ICD-10-CM

## 2015-10-05 ENCOUNTER — Telehealth: Payer: Self-pay

## 2015-10-05 ENCOUNTER — Encounter: Payer: Self-pay | Admitting: Medical

## 2015-10-05 ENCOUNTER — Telehealth: Payer: Self-pay | Admitting: Family

## 2015-10-05 ENCOUNTER — Ambulatory Visit (INDEPENDENT_AMBULATORY_CARE_PROVIDER_SITE_OTHER): Payer: 59 | Admitting: Medical

## 2015-10-05 ENCOUNTER — Ambulatory Visit (INDEPENDENT_AMBULATORY_CARE_PROVIDER_SITE_OTHER): Payer: PRIVATE HEALTH INSURANCE | Admitting: *Deleted

## 2015-10-05 VITALS — BP 118/86 | HR 91 | Temp 98.3°F | Resp 16 | Ht 66.0 in | Wt 183.0 lb

## 2015-10-05 DIAGNOSIS — N390 Urinary tract infection, site not specified: Secondary | ICD-10-CM | POA: Diagnosis not present

## 2015-10-05 DIAGNOSIS — R82998 Other abnormal findings in urine: Secondary | ICD-10-CM

## 2015-10-05 DIAGNOSIS — J3489 Other specified disorders of nose and nasal sinuses: Secondary | ICD-10-CM

## 2015-10-05 DIAGNOSIS — R319 Hematuria, unspecified: Secondary | ICD-10-CM | POA: Diagnosis not present

## 2015-10-05 DIAGNOSIS — R5383 Other fatigue: Secondary | ICD-10-CM

## 2015-10-05 DIAGNOSIS — J309 Allergic rhinitis, unspecified: Secondary | ICD-10-CM

## 2015-10-05 DIAGNOSIS — M791 Myalgia: Secondary | ICD-10-CM

## 2015-10-05 DIAGNOSIS — IMO0001 Reserved for inherently not codable concepts without codable children: Secondary | ICD-10-CM

## 2015-10-05 DIAGNOSIS — M609 Myositis, unspecified: Secondary | ICD-10-CM

## 2015-10-05 LAB — POCT URINALYSIS DIPSTICK
Bilirubin, UA: NEGATIVE
Glucose, UA: NEGATIVE
Ketones, UA: NEGATIVE
Nitrite, UA: NEGATIVE
Protein, UA: NEGATIVE
Spec Grav, UA: 1.025
Urobilinogen, UA: 0.2
pH, UA: 6

## 2015-10-05 LAB — POCT INFLUENZA A/B: Influenza A, POC: NEGATIVE

## 2015-10-05 MED ORDER — SULFAMETHOXAZOLE-TRIMETHOPRIM 800-160 MG PO TABS: 1.0000 | ORAL_TABLET | Freq: Two times a day (BID) | ORAL | Status: DC

## 2015-10-05 NOTE — Progress Notes (Signed)
Subjective:    Patient ID: Melissa Middleton, female    DOB: [birth year], 59 y.o.   MRN: [id number]  HPI   Pt in stating that she feels tired. She has some pnd. She gets allergy injection. No other allergy type symptoms.  Pt feel mild achy since Saturday. Diffuse achiness. Decreased appetite. Mild ha points to her sinus area.  Pt states last two times she would get Uti symptoms feeling wiped out and tired without classic uti symptoms.  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 She states with her work she needs speed and accuracy.  Pt not smoker.   Review of Systems  Constitutional: Positive for fatigue. Negative for fever and chills.  Respiratory: Negative for cough, chest tightness, shortness of breath and wheezing.   Cardiovascular: Negative for chest pain and palpitations.  Gastrointestinal: Negative for abdominal pain.  Genitourinary: Negative for dysuria, urgency, frequency, flank pain, vaginal pain and pelvic pain.  Musculoskeletal: Positive for myalgias. Negative for back pain, joint swelling and neck stiffness.  Skin: Negative for pallor and rash.  Neurological: Positive for headaches.       Frontal sinus pressure.     Past Medical History  Diagnosis Date  . Premenstrual tension syndromes     gyn uses alprazolam and spironolactone prn ofr treatment  . Benign neoplasm of stomach     gastric polyps  . Stricture and stenosis of esophagus   . Diaphragmatic hernia without mention of obstruction or gangrene   . Diverticulosis of colon (without mention of hemorrhage)   . Unspecified asthma(493.90)   . Esophageal reflux     see GI  . Bronchitis     recurrent  . Allergic rhinitis   . Sinusitis     recurrent  . Osteoarthritis     sees ortho-has had steroid shot in knee  . Post menopausal problems     symptoms  . Insomnia   . Anxiety   . Psoriasis   . Vitamin D deficiency     Social History   Social History  . Marital Status: Married   Spouse Name: N/A  . Number of Children: 2  . Years of Education: N/A   Occupational History  .      Administrative work   Social History Main Topics  . Smoking status: Never Smoker   . Smokeless tobacco: Never Used  . Alcohol Use: No  . Drug Use: No  . Sexual Activity: Yes     Comment: INTERCOURSE AGE 60, SEXUAL PARTNERS LESS THAN 5   Other Topics Concern  . Not on file   Social History Narrative    Past Surgical History  Procedure Laterality Date  . Tubal ligation  2004    with ablation  . Nasal sinus surgery    . Total knee arthroplasty  11/2009    Right knee  . Cesarean section    . Endometrial ablation      Family History  Problem Relation Age of Onset  . Arthritis Other   . Hypertension Mother   . Diabetes Father   . Hypertension Father   . Cancer Sister     MELANOMA  . Hypertension Maternal Grandfather   . Diabetes Paternal Grandmother   . Migraines Neg Hx     Allergies  Allergen Reactions  . Macrodantin [Nitrofurantoin Macrocrystal] Itching and Rash  . Cefuroxime Axetil Rash and Other (See Comments)    REACTION: red face/rash  . Penicillins Rash    Current  Outpatient Prescriptions on File Prior to Visit  Medication Sig Dispense Refill  . albuterol (PROVENTIL HFA;VENTOLIN HFA) 108 (90 BASE) MCG/ACT inhaler Inhale 2 puffs into the lungs every 6 (six) hours as needed for wheezing or shortness of breath. 3 Inhaler 1  . ALPRAZolam (XANAX) 0.25 MG tablet Take 1 tablet (0.25 mg total) by mouth at bedtime as needed for anxiety. 90 tablet 4  . cetirizine (ZYRTEC) 10 MG tablet Take 10 mg by mouth daily.      . Cholecalciferol 3000 UNITS TABS Take 1 tablet by mouth daily.    Marland Kitchen esomeprazole (NEXIUM) 40 MG capsule TAKE 1 CAPSULE (40 MG TOTAL) BY MOUTH DAILY BEFORE BREAKFAST. 90 capsule 1  . Estradiol POWD APPLY 1ML VAGINALLY TWICE WEEKLY. 24 g 0  . fluticasone (FLONASE) 50 MCG/ACT nasal spray 2 sprays each nostril once daily     . mometasone (ASMANEX 60  METERED DOSES) 220 MCG/INH inhaler Inhale 2 puffs into the lungs daily. 3 Inhaler 1  . montelukast (SINGULAIR) 10 MG tablet Take 1 tablet (10 mg total) by mouth at bedtime. 90 tablet 1  . NONFORMULARY OR COMPOUNDED ITEM Estradiol .02% 1 ML Prefilled Applicator Sig: apply vaginally twice a week #90 Day Supply with 4 refills 1 each 4  . spironolactone (ALDACTONE) 25 MG tablet Take 1 tablet (25 mg total) by mouth daily as needed. 90 tablet 4  . traZODone (DESYREL) 100 MG tablet TAKE 1 TABLET (100 MG TOTAL) BY MOUTH AT BEDTIME. 90 tablet 0  . Vitamin D, Ergocalciferol, (DRISDOL) 50000 UNITS CAPS capsule Take 1 capsule (50,000 Units total) by mouth every 7 (seven) days. 12 capsule 0   No current facility-administered medications on file prior to visit.    BP 118/86 mmHg  Pulse 91  Temp(Src) 98.3 F (36.8 C) (Oral)  Resp 16  Ht 5\' 6"  (1.676 m)  Wt 183 lb (83.008 kg)  BMI 29.55 kg/m2  SpO2 97%       Objective:   Physical Exam   General  Mental Status - Alert. General Appearance - Well groomed. Not in acute distress.  Skin Rashes- No Rashes.  HEENT Head- Normal. Ear Auditory Canal - Left- Normal. Right - Normal.Tympanic Membrane- Left- Normal. Right- Normal. Eye Sclera/Conjunctiva- Left- Normal. Right- Normal. Nose & Sinuses Nasal Mucosa- Left-  Mild  boggy or Congested. Right-  Mild   boggy or Congested. Faint frontal sinus pressure. Mouth & Throat Lips: Upper Lip- Normal: no dryness, cracking, pallor, cyanosis, or vesicular eruption. Lower Lip-Normal: no dryness, cracking, pallor, cyanosis or vesicular eruption. Buccal Mucosa- Bilateral- No Aphthous ulcers. Oropharynx- No Discharge or Erythema. Tonsils: Characteristics- Bilateral- No Erythema or Congestion. Size/Enlargement- Bilateral- No enlargement. Discharge- bilateral-None.  Neck Neck- Supple. No Masses.   Chest and Lung Exam Auscultation: Breath Sounds:- even and unlabored.  Cardiovascular Auscultation:Rythm-  Regular, rate and rhythm. Murmurs & Other Heart Sounds:Ausculatation of the heart reveal- No Murmurs.  Lymphatic Head & Neck General Head & Neck Lymphatics: Bilateral: Description- No Localized lymphadenopathy.   Abdomen Palpation/Percussion: Palpation and Percussion of the abdomen reveal- Non Tender, No Rebound tenderness, No Rigidity(guarding), No Palpable abdominal masses and No jar tenderness. No suprapubic tenderness. Liver:-Normal. Spleen:- Normal. Other Characteristics- No Costovertebral angle tenderness- Left or Costovertebral angle tenderness- Right.  Auscultation: Auscultation of the abdomen reveals- Bowel Sounds normal.  Back- no cva tenderness    Neurologic Cranial Nerve exam:- CN III-XII intact(No nystagmus), symmetric smile. Strength:- 5/5 equal and symmetric strength both upper and lower extremities.  Assessment & Plan:  With your history of uti in the past with minimal symptoms(along with leukocytes in urine), I  decided to rx bactim ds antibiotic.  Bactrim antibiotic may cover you sinuses in event early infection.(antibiotic choice limited based on allergy history.) you could also use flonase otc as well.  I do want to repeat your urine dip in 7-10 days since you report hx of blood in urine when at other clinic.   Your flu test was was negative today. When in for repeat urine would recommend you get flu vaccine.  If you fatigue has not improved by follow up would recommend cbc, tsh, and cmp.

## 2015-10-05 NOTE — Progress Notes (Signed)
Pre visit review using our clinic review tool, if applicable. No additional management support is needed unless otherwise documented below in the visit note. 

## 2015-10-05 NOTE — Telephone Encounter (Signed)
Noted and agree with need for office visit.

## 2015-10-05 NOTE — Patient Instructions (Addendum)
With your history of uti in the past with minimal symptoms(along with leukocytes in urine), I  decided to rx bactim ds antibiotic.  Bactrim antibiotic may cover you sinuses in event early infection.(antibiotic choice limited based on allergy history.) you could also use flonase otc as well.  I do want to repeat your urine dip in 7-10 days since you report hx of blood in urine when at other clinic.   Your flu test was was negative today. When in for repeat urine would recommend you get flu vaccine.  If you fatigue has not improved by follow up would recommend cbc, tsh, and cmp.

## 2015-10-05 NOTE — Telephone Encounter (Signed)
Called patient,states she has had problems in the past and would like to know if she could come in and submit Urine for testing. Discuseed with M.Osullivans nurse,states she need for OV. Appointment scheduled for patient being seen by E. Saguier today.

## 2015-10-05 NOTE — Telephone Encounter (Signed)
Caller name: Melissa Middleton   Relationship to patient: Self   Can be reached: [phone]   Reason for call: Pt called in asking if she can just walk in to Lab to be tested for a UTI, informed pt no, she will have to schedule an appt. She says that CMA told her otherwise and requested a call back to discuss further.

## 2015-10-05 NOTE — Telephone Encounter (Signed)
Patient in for Anticoagulation visit.

## 2015-10-07 ENCOUNTER — Telehealth: Payer: Self-pay | Admitting: *Deleted

## 2015-10-07 LAB — URINE CULTURE: Colony Count: >=100000

## 2015-10-07 NOTE — Telephone Encounter (Signed)
LMOM with contact name and number for return call RE: results and further provider instructions/SLS Medication Detail      Disp Refills Start End     sulfamethoxazole-trimethoprim (BACTRIM DS,SEPTRA DS) 800-160 MG tablet 14 tablet 0 10/05/2015     Sig - Route: Take 1 tablet by mouth 2 (two) times daily. - Oral    E-Prescribing Status: Receipt confirmed by pharmacy (10/05/2015 4:59 PM EDT)   Pharmacy    [id number] - [place], [place] - [id number]   SEE Result Note.

## 2015-10-07 NOTE — Telephone Encounter (Signed)
-----   Message from Mackie Pai, PA-C sent at 10/07/2015  7:59 AM EDT ----- Pt has uti. Over 100,000 bacteria. Rx bactrim should help but will confirm when sensitivity report comes in.

## 2015-10-13 ENCOUNTER — Ambulatory Visit (INDEPENDENT_AMBULATORY_CARE_PROVIDER_SITE_OTHER): Payer: PRIVATE HEALTH INSURANCE | Admitting: *Deleted

## 2015-10-13 DIAGNOSIS — J309 Allergic rhinitis, unspecified: Secondary | ICD-10-CM | POA: Diagnosis not present

## 2015-10-21 ENCOUNTER — Ambulatory Visit (INDEPENDENT_AMBULATORY_CARE_PROVIDER_SITE_OTHER): Payer: PRIVATE HEALTH INSURANCE

## 2015-10-21 DIAGNOSIS — J309 Allergic rhinitis, unspecified: Secondary | ICD-10-CM

## 2015-10-26 ENCOUNTER — Encounter: Payer: Self-pay | Admitting: Medical

## 2015-10-26 ENCOUNTER — Telehealth: Payer: Self-pay | Admitting: Medical

## 2015-10-26 ENCOUNTER — Ambulatory Visit (INDEPENDENT_AMBULATORY_CARE_PROVIDER_SITE_OTHER): Payer: 59 | Admitting: Medical

## 2015-10-26 VITALS — BP 124/80 | HR 88 | Temp 97.8°F | Ht 66.0 in | Wt 186.0 lb

## 2015-10-26 DIAGNOSIS — N390 Urinary tract infection, site not specified: Secondary | ICD-10-CM

## 2015-10-26 DIAGNOSIS — R5383 Other fatigue: Secondary | ICD-10-CM

## 2015-10-26 DIAGNOSIS — K921 Melena: Secondary | ICD-10-CM | POA: Diagnosis not present

## 2015-10-26 DIAGNOSIS — Z1211 Encounter for screening for malignant neoplasm of colon: Secondary | ICD-10-CM

## 2015-10-26 DIAGNOSIS — R82998 Other abnormal findings in urine: Secondary | ICD-10-CM

## 2015-10-26 DIAGNOSIS — R319 Hematuria, unspecified: Secondary | ICD-10-CM

## 2015-10-26 LAB — POCT URINALYSIS DIPSTICK
Bilirubin, UA: NEGATIVE
Blood, UA: NEGATIVE
Glucose, UA: NEGATIVE
Leukocytes, UA: NEGATIVE
Nitrite, UA: NEGATIVE
Spec Grav, UA: >=1.03
Urobilinogen, UA: 0.2
pH, UA: 5.5

## 2015-10-26 NOTE — Progress Notes (Signed)
Pre visit review using our clinic review tool, if applicable. No additional management support is needed unless otherwise documented below in the visit note. 

## 2015-10-26 NOTE — Progress Notes (Signed)
Subjective:    Patient ID: Melissa Middleton, female    DOB: 24-Jul-1956, 59 y.o.   MRN: [id number]  HPI  Pt in states that she feels tired all day yesterday. She has slight had ha. Pt feels very stressed. She just found out yesterday that her sister has ovarian cancer.  Pt has uti on last visit. I rx'd bactrim.  Pt has some hx of hematuria on last ua.   Last time in had vague symptoms but had uti by culture. In fact recently when she had uti she states had no symptoms.   Pt does feel fatigued and sluggish last 24 hours.   Pt saw Dr. Toney Rakes in August her pap smear was negative. Bi-manual exam have been done.    Review of Systems  Constitutional: Positive for fatigue. Negative for fever and chills.  Respiratory: Negative for cough, chest tightness, shortness of breath and wheezing.   Cardiovascular: Negative for chest pain and palpitations.  Gastrointestinal: Negative for abdominal pain.       Occasional bright red blood when she wipes. No rectal itching.   Musculoskeletal: Negative for arthralgias and gait problem.  Neurological: Negative for dizziness, weakness, light-headedness, numbness and headaches.  Hematological: Negative for adenopathy. Does not bruise/bleed easily.     Past Medical History  Diagnosis Date  . Premenstrual tension syndromes     gyn uses alprazolam and spironolactone prn ofr treatment  . Benign neoplasm of stomach     gastric polyps  . Stricture and stenosis of esophagus   . Diaphragmatic hernia without mention of obstruction or gangrene   . Diverticulosis of colon (without mention of hemorrhage)   . Unspecified asthma(493.90)   . Esophageal reflux     see GI  . Bronchitis     recurrent  . Allergic rhinitis   . Sinusitis     recurrent  . Osteoarthritis     sees ortho-has had steroid shot in knee  . Post menopausal problems     symptoms  . Insomnia   . Anxiety   . Psoriasis   . Vitamin D deficiency     Social History   Social  History  . Marital Status: Married    Spouse Name: N/A  . Number of Children: 2  . Years of Education: N/A   Occupational History  .      Administrative work   Social History Main Topics  . Smoking status: Never Smoker   . Smokeless tobacco: Never Used  . Alcohol Use: No  . Drug Use: No  . Sexual Activity: Yes     Comment: INTERCOURSE AGE 78, SEXUAL PARTNERS LESS THAN 5   Other Topics Concern  . Not on file   Social History Narrative    Past Surgical History  Procedure Laterality Date  . Tubal ligation  2004    with ablation  . Nasal sinus surgery    . Total knee arthroplasty  11/2009    Right knee  . Cesarean section    . Endometrial ablation      Family History  Problem Relation Age of Onset  . Arthritis Other   . Hypertension Mother   . Diabetes Father   . Hypertension Father   . Cancer Sister     MELANOMA  . Hypertension Maternal Grandfather   . Diabetes Paternal Grandmother   . Migraines Neg Hx     Allergies  Allergen Reactions  . Macrodantin [Nitrofurantoin Macrocrystal] Itching and Rash  . Cefuroxime Axetil Rash  and Other (See Comments)    REACTION: red face/rash  . Penicillins Rash    Current Outpatient Prescriptions on File Prior to Visit  Medication Sig Dispense Refill  . albuterol (PROVENTIL HFA;VENTOLIN HFA) 108 (90 BASE) MCG/ACT inhaler Inhale 2 puffs into the lungs every 6 (six) hours as needed for wheezing or shortness of breath. 3 Inhaler 1  . ALPRAZolam (XANAX) 0.25 MG tablet Take 1 tablet (0.25 mg total) by mouth at bedtime as needed for anxiety. 90 tablet 4  . cetirizine (ZYRTEC) 10 MG tablet Take 10 mg by mouth daily.      . Cholecalciferol 3000 UNITS TABS Take 1 tablet by mouth daily.    Marland Kitchen esomeprazole (NEXIUM) 40 MG capsule TAKE 1 CAPSULE (40 MG TOTAL) BY MOUTH DAILY BEFORE BREAKFAST. 90 capsule 1  . Estradiol POWD APPLY 1ML VAGINALLY TWICE WEEKLY. 24 g 0  . fluticasone (FLONASE) 50 MCG/ACT nasal spray 2 sprays each nostril once  daily     . mometasone (ASMANEX 60 METERED DOSES) 220 MCG/INH inhaler Inhale 2 puffs into the lungs daily. 3 Inhaler 1  . montelukast (SINGULAIR) 10 MG tablet Take 1 tablet (10 mg total) by mouth at bedtime. 90 tablet 1  . NONFORMULARY OR COMPOUNDED ITEM Estradiol .02% 1 ML Prefilled Applicator Sig: apply vaginally twice a week #90 Day Supply with 4 refills 1 each 4  . spironolactone (ALDACTONE) 25 MG tablet Take 1 tablet (25 mg total) by mouth daily as needed. 90 tablet 4  . sulfamethoxazole-trimethoprim (BACTRIM DS,SEPTRA DS) 800-160 MG tablet Take 1 tablet by mouth 2 (two) times daily. 14 tablet 0  . traZODone (DESYREL) 100 MG tablet TAKE 1 TABLET (100 MG TOTAL) BY MOUTH AT BEDTIME. 90 tablet 0  . Vitamin D, Ergocalciferol, (DRISDOL) 50000 UNITS CAPS capsule Take 1 capsule (50,000 Units total) by mouth every 7 (seven) days. 12 capsule 0   No current facility-administered medications on file prior to visit.    BP 124/80 mmHg  Pulse 88  Temp(Src) 97.8 F (36.6 C) (Oral)  Ht 5\' 6"  (1.676 m)  Wt 186 lb (84.369 kg)  BMI 30.04 kg/m2  SpO2 98%       Objective:   Physical Exam   General Appearance- Not in acute distress.  HEENT Eyes- Scleraeral/Conjuntiva-bilat- Not Yellow. Mouth & Throat- Normal.  Chest and Lung Exam Auscultation: Breath sounds:-Normal. Adventitious sounds:- No Adventitious sounds.  Cardiovascular Auscultation:Rythm - Regular. Heart Sounds -Normal heart sounds.  Abdomen Inspection:-Inspection Normal.  Palpation/Perucssion: Palpation and Percussion of the abdomen reveal- Non Tender, No Rebound tenderness, No rigidity(Guarding) and No Palpable abdominal masses.  Liver:-Normal.  Spleen:- Normal.    Back- no cva tenderness. Rectal- on inspection. No obvious hemorrhoid. No fissure. No bleeding.       Assessment & Plan:  Will get a urine dip today and get a culture. If any blood in urine the refer to urologist.  Ask that you contact Dr. Toney Rakes and  explain sister hx of ovarian cancer. He may want to do ca-125 and [place].  For fatigue get cbc, tsh  and cmp.  If stool card test + then will refer to GI for colonsocpy.  Follow up in 7 days or as needed

## 2015-10-26 NOTE — Telephone Encounter (Signed)
Did pt have any blood in her urine on October 26, 2015. If so I want to refer her to urology.

## 2015-10-26 NOTE — Telephone Encounter (Signed)
No blood in urine, I also put results in.

## 2015-10-26 NOTE — Patient Instructions (Addendum)
Will get a urine dip today and get a culture. If any blood in urine the refer to urologist.  Ask that you contact Dr. Toney Rakes and explain sister hx of ovarian cancer. He may want to do ca-125 and [place].  For fatigue get cbc, tsh  and cmp.  If stool card test + then will refer to GI for colonsocpy.  Follow up in 7 days or as needed

## 2015-10-27 ENCOUNTER — Other Ambulatory Visit: Payer: Self-pay

## 2015-10-27 ENCOUNTER — Ambulatory Visit (INDEPENDENT_AMBULATORY_CARE_PROVIDER_SITE_OTHER): Payer: PRIVATE HEALTH INSURANCE

## 2015-10-27 DIAGNOSIS — J309 Allergic rhinitis, unspecified: Secondary | ICD-10-CM

## 2015-10-27 LAB — COMPREHENSIVE METABOLIC PANEL
ALT: 15 U/L (ref 0–35)
AST: 16 U/L (ref 0–37)
Albumin: 4.1 g/dL (ref 3.5–5.2)
Alkaline Phosphatase: 73 U/L (ref 39–117)
BUN: 16 mg/dL (ref 6–23)
CO2: 29 mEq/L (ref 19–32)
Calcium: 9.6 mg/dL (ref 8.4–10.5)
Chloride: 103 mEq/L (ref 96–112)
Creatinine, Ser: 0.81 mg/dL (ref 0.40–1.20)
GFR: 76.76 mL/min (ref >60.00)
Glucose, Bld: 99 mg/dL (ref 70–99)
Potassium: 3.6 mEq/L (ref 3.5–5.1)
Sodium: 142 mEq/L (ref 135–145)
Total Bilirubin: 0.3 mg/dL (ref 0.2–1.2)
Total Protein: 7.2 g/dL (ref 6.0–8.3)

## 2015-10-27 LAB — CBC WITH DIFFERENTIAL/PLATELET
Basophils Absolute: 0 10*3/uL (ref 0.0–0.1)
Basophils Relative: 0.5 % (ref 0.0–3.0)
Eosinophils Absolute: 0.3 10*3/uL (ref 0.0–0.7)
Eosinophils Relative: 4.3 % (ref 0.0–5.0)
HCT: 40.2 % (ref 36.0–46.0)
Hemoglobin: 13.1 g/dL (ref 12.0–15.0)
Lymphocytes Relative: 21.9 % (ref 12.0–46.0)
Lymphs Abs: 1.7 10*3/uL (ref 0.7–4.0)
MCHC: 32.5 g/dL (ref 30.0–36.0)
MCV: 86.3 fl (ref 78.0–100.0)
Monocytes Absolute: 0.6 10*3/uL (ref 0.1–1.0)
Monocytes Relative: 7.6 % (ref 3.0–12.0)
Neutro Abs: 5.1 10*3/uL (ref 1.4–7.7)
Neutrophils Relative %: 65.7 % (ref 43.0–77.0)
Platelets: 300 10*3/uL (ref 150.0–400.0)
RBC: 4.66 Mil/uL (ref 3.87–5.11)
RDW: 14.1 % (ref 11.5–15.5)
WBC: 7.7 10*3/uL (ref 4.0–10.5)

## 2015-10-27 LAB — TSH: TSH: 0.87 u[IU]/mL (ref 0.35–4.50)

## 2015-10-27 MED ORDER — EPINEPHRINE 0.3 MG/0.3ML IJ SOAJ: 0.3000 mg | Freq: Once | INTRAMUSCULAR | Status: DC

## 2015-10-30 ENCOUNTER — Telehealth: Payer: Self-pay | Admitting: Medical

## 2015-10-30 ENCOUNTER — Telehealth: Payer: Self-pay | Admitting: *Deleted

## 2015-10-30 LAB — URINE CULTURE: Colony Count: 15000

## 2015-10-30 MED ORDER — LEVOFLOXACIN 500 MG PO TABS: 500.0000 mg | ORAL_TABLET | Freq: Every day | ORAL | Status: DC

## 2015-10-30 NOTE — Telephone Encounter (Signed)
Pt called to follow up telephone encounter on 08/11/15. Pt will come have Vitamin D level rechecked as noted.

## 2015-10-30 NOTE — Telephone Encounter (Signed)
Sent in levofloxin. If symptomatic for uti.

## 2015-10-31 ENCOUNTER — Other Ambulatory Visit: Payer: Self-pay | Admitting: Gynecology

## 2015-11-03 ENCOUNTER — Ambulatory Visit (INDEPENDENT_AMBULATORY_CARE_PROVIDER_SITE_OTHER): Payer: PRIVATE HEALTH INSURANCE | Admitting: *Deleted

## 2015-11-03 ENCOUNTER — Telehealth: Payer: Self-pay | Admitting: Medical

## 2015-11-03 DIAGNOSIS — Z1211 Encounter for screening for malignant neoplasm of colon: Secondary | ICD-10-CM

## 2015-11-03 DIAGNOSIS — J309 Allergic rhinitis, unspecified: Secondary | ICD-10-CM

## 2015-11-03 NOTE — Telephone Encounter (Signed)
referral colonoscopy put in.

## 2015-11-04 ENCOUNTER — Telehealth: Payer: Self-pay | Admitting: Medical

## 2015-11-04 NOTE — Telephone Encounter (Signed)
Patient mailed off sample yesterday afternoon 11/03/15. Awaiting results.

## 2015-11-04 NOTE — Telephone Encounter (Signed)
Let pt know that GI office  pointed out she had colonoscopy  just recently. But still doe the stool test for blood. If that comes back positive will recommend seeing GI. If test come back negative then won't refer.

## 2015-11-04 NOTE — Telephone Encounter (Signed)
Acutually she is due for one in 2018. But if stool positive for blood will refer her sooner. So please do the test.

## 2015-11-05 ENCOUNTER — Other Ambulatory Visit: Payer: 59

## 2015-11-05 ENCOUNTER — Telehealth: Payer: Self-pay | Admitting: Family

## 2015-11-05 DIAGNOSIS — Z1211 Encounter for screening for malignant neoplasm of colon: Secondary | ICD-10-CM

## 2015-11-05 NOTE — Telephone Encounter (Signed)
Order in      KP 

## 2015-11-05 NOTE — Telephone Encounter (Signed)
Caller name: Niner   Relationship to patient: [facility] office   Can be reached: [phone]  Reason for call: she called in to get a pt's lab order status changed to future. (IFOB)

## 2015-11-09 ENCOUNTER — Other Ambulatory Visit (INDEPENDENT_AMBULATORY_CARE_PROVIDER_SITE_OTHER): Payer: 59

## 2015-11-09 DIAGNOSIS — Z1211 Encounter for screening for malignant neoplasm of colon: Secondary | ICD-10-CM | POA: Diagnosis not present

## 2015-11-09 LAB — FECAL OCCULT BLOOD, IMMUNOCHEMICAL: Fecal Occult Bld: NEGATIVE

## 2015-11-10 ENCOUNTER — Ambulatory Visit (INDEPENDENT_AMBULATORY_CARE_PROVIDER_SITE_OTHER): Payer: PRIVATE HEALTH INSURANCE

## 2015-11-10 DIAGNOSIS — J309 Allergic rhinitis, unspecified: Secondary | ICD-10-CM | POA: Diagnosis not present

## 2015-12-01 ENCOUNTER — Ambulatory Visit (INDEPENDENT_AMBULATORY_CARE_PROVIDER_SITE_OTHER): Payer: PRIVATE HEALTH INSURANCE

## 2015-12-01 DIAGNOSIS — J309 Allergic rhinitis, unspecified: Secondary | ICD-10-CM | POA: Diagnosis not present

## 2015-12-08 ENCOUNTER — Encounter: Payer: Self-pay | Admitting: Gynecology

## 2015-12-08 ENCOUNTER — Ambulatory Visit (INDEPENDENT_AMBULATORY_CARE_PROVIDER_SITE_OTHER): Payer: PRIVATE HEALTH INSURANCE | Admitting: Gynecology

## 2015-12-08 VITALS — BP 132/86

## 2015-12-08 DIAGNOSIS — Z8041 Family history of malignant neoplasm of ovary: Secondary | ICD-10-CM | POA: Diagnosis not present

## 2015-12-08 DIAGNOSIS — Z8639 Personal history of other endocrine, nutritional and metabolic disease: Secondary | ICD-10-CM | POA: Diagnosis not present

## 2015-12-08 DIAGNOSIS — Z780 Asymptomatic menopausal state: Secondary | ICD-10-CM | POA: Diagnosis not present

## 2015-12-08 NOTE — Progress Notes (Signed)
    patient is a 59 year old the presented to the office today to check her vitamin D level. Patient with history vitamin D deficiency in the past. At the time of office visit on August of this year her vitamin D level was 28.8 and she was started on 50,000 units of vitamin D daily weekly for 12 weeks and has completed and is here for vitamin D level. Patient prior to that had been on vitamin D 2000 units daily. Patient also brought up to my attention that her sister which is several years younger than her was diagnosed recently with stage IC ovarian cancer and is currently undergoing chemotherapy. Review of patient's record we had done an ultrasound here in the office back in 2011 because of vaginismus and her ultrasound was otherwise normal.    I have recommended that beginning next year at time of her annual exam we delivered ultrasound of her pelvis for ovarian cancer screening. If she feels any abdominal bloating or any unusual pain before that she'll return to the office for screening. If her vitamin D level has not returned back to normal we'll consider placing on vitamin D 50,000 units every other week for 6 months and then going back to vitamin D 3 2000 units daily after rechecking her vitamin D level. We discussed importance  Of weightbearing exercises as well for osteoporosis prevention. Patient's bone density study  In 2015 was normal adenoid no statistically significant changes when compared with 2011  With the exception of right hip decrease in bone mineralization of 10% but otherwise normal T scores.

## 2015-12-22 ENCOUNTER — Ambulatory Visit (INDEPENDENT_AMBULATORY_CARE_PROVIDER_SITE_OTHER): Payer: PRIVATE HEALTH INSURANCE | Admitting: *Deleted

## 2015-12-22 DIAGNOSIS — J309 Allergic rhinitis, unspecified: Secondary | ICD-10-CM | POA: Diagnosis not present

## 2015-12-23 ENCOUNTER — Ambulatory Visit: Payer: 59 | Admitting: Family

## 2015-12-23 ENCOUNTER — Telehealth: Payer: Self-pay

## 2015-12-23 NOTE — Telephone Encounter (Signed)
Per staff message from Dr. Moshe Salisbury  "Please tell patient that her vitamin D level is slightly below normal. I would like her to take a 50,000 unit tablet every other week for 6 months and then I would like to check her vitamin D level at that time and then go back to the vitamin D 2000 units daily. For now have her stop the vitamin D3 2000 units daily."

## 2015-12-29 ENCOUNTER — Other Ambulatory Visit: Payer: Self-pay | Admitting: Gynecology

## 2015-12-29 DIAGNOSIS — E559 Vitamin D deficiency, unspecified: Secondary | ICD-10-CM

## 2015-12-29 MED ORDER — VITAMIN D (ERGOCALCIFEROL) 1.25 MG (50000 UNIT) PO CAPS: ORAL_CAPSULE | ORAL | Status: DC

## 2015-12-29 NOTE — Telephone Encounter (Signed)
Patient called. Informed. Rx sent. Order and recall placed.

## 2016-01-05 ENCOUNTER — Ambulatory Visit (INDEPENDENT_AMBULATORY_CARE_PROVIDER_SITE_OTHER): Payer: PRIVATE HEALTH INSURANCE | Admitting: Pediatrics

## 2016-01-05 ENCOUNTER — Encounter: Payer: Self-pay | Admitting: Pediatrics

## 2016-01-05 VITALS — BP 142/82 | HR 96 | Temp 97.9°F | Resp 20 | Ht 65.4 in | Wt 192.7 lb

## 2016-01-05 DIAGNOSIS — J3089 Other allergic rhinitis: Secondary | ICD-10-CM

## 2016-01-05 DIAGNOSIS — J4541 Moderate persistent asthma with (acute) exacerbation: Secondary | ICD-10-CM

## 2016-01-05 DIAGNOSIS — L501 Idiopathic urticaria: Secondary | ICD-10-CM | POA: Insufficient documentation

## 2016-01-05 DIAGNOSIS — J302 Other seasonal allergic rhinitis: Secondary | ICD-10-CM | POA: Insufficient documentation

## 2016-01-05 DIAGNOSIS — J4540 Moderate persistent asthma, uncomplicated: Secondary | ICD-10-CM | POA: Insufficient documentation

## 2016-01-05 LAB — PULMONARY FUNCTION TEST

## 2016-01-05 MED ORDER — FLUTICASONE PROPIONATE 50 MCG/ACT NA SUSP: NASAL | Status: DC

## 2016-01-05 MED ORDER — ALBUTEROL SULFATE HFA 108 (90 BASE) MCG/ACT IN AERS: 2.0000 | INHALATION_SPRAY | RESPIRATORY_TRACT | Status: DC | PRN

## 2016-01-05 NOTE — Patient Instructions (Signed)
Continue on her current medications Add prednisone 20 mg twice a day for 3 days, 20 mg on day 4, 10 mg on day 5 She may get her next allergy injection due next week Continue on Zyrtec 10 mg once a day but she may use Zyrtec 10 mg twice a day if needed for itching She will call me if she does not clear up on this treatment plan

## 2016-01-05 NOTE — Progress Notes (Signed)
[place] [id number] Dept: [phone]  FOLLOW UP NOTE  Patient ID: Melissa Middleton, female    DOB: [birth year]  Age: 60 y.o. MRN: [id number] Date of Office Visit: 01/05/2016  Assessment Chief Complaint: Rash  HPI Melissa Middleton presents for evaluation of an itchy rash that has been present for about a week. She had a cold last week and has had some coughing. She has had a flushing sensation and has felt itchy. She does not have a history of hives. She is tolerating her allergy injections well and gets her allergy injections every 3 weeks. Except for the cough for the past week her asthma has been well controlled. Her nasal symptoms are well controlled..  Current medications are Pro-air 2 puffs every 4 hours if needed, fluticasone 2 sprays per nostril once a day, Asmanex 2 20-2 puffs once a day, cetirizine 10 mg once a day,  esomeprazole 40 mg once a day, montelukast 10 mg once a day. Her other medications are outlined in the chart     Drug Allergies:  Allergies  Allergen Reactions  . Macrodantin [Nitrofurantoin Macrocrystal] Itching and Rash  . Cefuroxime Axetil Rash and Other (See Comments)    REACTION: red face/rash  . Penicillins Rash    Physical Exam: BP 142/82 mmHg  Pulse 96  Temp(Src) 97.9 F (36.6 C) (Oral)  Resp 20  Ht 5' 5.4" (1.661 m)  Wt 192 lb 10.9 oz (87.4 kg)  BMI 31.68 kg/m2   Physical Exam  Constitutional: She is oriented to person, place, and time. She appears well-developed and well-nourished.  HENT:  Eyes normal. Ears normal. Nose normal. Pharynx normal.  Neck: Neck supple. No thyromegaly present.  Cardiovascular:  S1 and S2 normal no murmurs  Pulmonary/Chest:  Clear to percussion and auscultation  Lymphadenopathy:    She has no cervical adenopathy.  Neurological: She is alert and oriented to person, place, and time.  Skin:  Mild erythema around her neck and upper thorax  Psychiatric: She has a normal mood and affect. Her  behavior is normal. Judgment and thought content normal.  Vitals reviewed.   Diagnostics:  FVC 2.79 L FEV1 1.81 L. Predicted FVC 3.45 L predicted FEV1 2.67 L-shows a mild reduction in the FEV1  Assessment and Plan: 1. Moderate persistent asthma, with acute exacerbation   2. Idiopathic urticaria   3. Other allergic rhinitis     Meds ordered this encounter  Medications  . fluticasone (FLONASE) 50 MCG/ACT nasal spray    Sig: TWO SPRAYS EACH NOSTRIL ONCE A DAY FOR NASAL CONGESTION OR DRAINAGE.    Dispense:  16 g    Refill:  5  . albuterol (PROVENTIL HFA;VENTOLIN HFA) 108 (90 Base) MCG/ACT inhaler    Sig: Inhale 2 puffs into the lungs every 4 (four) hours as needed for wheezing or shortness of breath.    Dispense:  8 g    Refill:  1    Patient Instructions  Continue on her current medications Add prednisone 20 mg twice a day for 3 days, 20 mg on day 4, 10 mg on day 5 She may get her next allergy injection due next week Continue on Zyrtec 10 mg once a day but she may use Zyrtec 10 mg twice a day if needed for itching She will call me if she does not clear up on this treatment plan    Return in about 6 months (around 07/04/2016).    Thank you for the opportunity to  care for this patient.  Please do not hesitate to contact me with questions.  Penne Lash, M.D.  [facility] [address] [place], [place] [address] [phone]

## 2016-01-06 ENCOUNTER — Encounter: Payer: Self-pay | Admitting: Pediatrics

## 2016-01-12 ENCOUNTER — Ambulatory Visit (INDEPENDENT_AMBULATORY_CARE_PROVIDER_SITE_OTHER): Payer: PRIVATE HEALTH INSURANCE | Admitting: *Deleted

## 2016-01-12 DIAGNOSIS — J309 Allergic rhinitis, unspecified: Secondary | ICD-10-CM

## 2016-01-14 ENCOUNTER — Other Ambulatory Visit: Payer: Self-pay | Admitting: Family

## 2016-01-14 DIAGNOSIS — J3089 Other allergic rhinitis: Secondary | ICD-10-CM | POA: Diagnosis not present

## 2016-01-15 DIAGNOSIS — J301 Allergic rhinitis due to pollen: Secondary | ICD-10-CM | POA: Diagnosis not present

## 2016-01-15 NOTE — Telephone Encounter (Signed)
Refilled patients rx request refill for #90 w 1 rf

## 2016-01-23 ENCOUNTER — Telehealth: Payer: Self-pay | Admitting: Family

## 2016-01-25 NOTE — Telephone Encounter (Signed)
Asmanex refill sent to pharmacy. Pt last seen by PCP 06/2015 and advised 3 month follow up. Pt is past due and will need office visit before further refills can be given.  Please call pt to schedule f/u with Melissa soon. Thanks!

## 2016-01-28 NOTE — Telephone Encounter (Signed)
Called patient made her aware that at last visit, Melissa wanted patient follow up in 3 months.  Pt stated she didn't understand why.  Try to explain the process here in our office.  Pt stated she's constantly going to the doctor and doesn't need to go as often as advised, but would call back tomorrow to schedule appt.

## 2016-01-28 NOTE — Telephone Encounter (Signed)
Called pt. Pt would like a call back from [place]. She says that she isn't sure why she need a FU appt.   CB: [phone]

## 2016-01-29 NOTE — Telephone Encounter (Signed)
Noted  

## 2016-02-02 ENCOUNTER — Ambulatory Visit (INDEPENDENT_AMBULATORY_CARE_PROVIDER_SITE_OTHER): Payer: PRIVATE HEALTH INSURANCE | Admitting: *Deleted

## 2016-02-02 DIAGNOSIS — J309 Allergic rhinitis, unspecified: Secondary | ICD-10-CM | POA: Diagnosis not present

## 2016-02-03 ENCOUNTER — Other Ambulatory Visit: Payer: Self-pay | Admitting: Gynecology

## 2016-02-04 NOTE — Telephone Encounter (Signed)
Per office note on 08/05/15 # 90 with 4 refill, it is time for refill Rx will be called, with 1 refill that will give pt enough until annual in august.

## 2016-02-09 ENCOUNTER — Encounter: Payer: Self-pay | Admitting: Medical

## 2016-02-09 ENCOUNTER — Ambulatory Visit (INDEPENDENT_AMBULATORY_CARE_PROVIDER_SITE_OTHER): Payer: 59 | Admitting: Medical

## 2016-02-09 VITALS — BP 123/85 | HR 82 | Temp 98.7°F | Wt 191.0 lb

## 2016-02-09 DIAGNOSIS — L853 Xerosis cutis: Secondary | ICD-10-CM | POA: Diagnosis not present

## 2016-02-09 DIAGNOSIS — L309 Dermatitis, unspecified: Secondary | ICD-10-CM | POA: Diagnosis not present

## 2016-02-09 MED ORDER — TRIAMCINOLONE ACETONIDE 0.1 % EX CREA: 1.0000 "application " | TOPICAL_CREAM | Freq: Two times a day (BID) | CUTANEOUS | Status: DC

## 2016-02-09 MED ORDER — AMMONIUM LACTATE 12 % EX CREA: TOPICAL_CREAM | CUTANEOUS | Status: DC

## 2016-02-09 MED ORDER — HYDROXYZINE HCL 25 MG PO TABS: 25.0000 mg | ORAL_TABLET | Freq: Three times a day (TID) | ORAL | Status: DC | PRN

## 2016-02-09 MED ORDER — PREDNISONE 10 MG PO TABS: ORAL_TABLET | ORAL | Status: DC

## 2016-02-09 NOTE — Progress Notes (Signed)
Subjective:    Patient ID: Melissa Middleton, female    DOB: [birth year], 60 y.o.   MRN: [id number]  HPI   Pt in for described dry skin and rash to her skin over past month. Pt states in past she feels like her skin flaky. This has been for years. But recently rash on her face is new. Pt did put some lavender on her skin. Hx of sensitive skin to some soaps. So she thought lavender might have set off allergic reaction.  Pt went to her allergist 1 month ago. Pt allergist gave her prednisone and she felt better. Pt thinks rash is more prominent on her face then and now. But she states she did respond to prednisone but then her symptoms return.  Pt sees allergist and get some immunotherapy. Pt has been on zytec and singulair.  Pt states sensitive skin to her hands/mcp joints. Sometimes over the years. Will occur if she gets a lot of water exposure/washing her hands also.   Hx of rt knee circular red dry lesion since 2010.   Pt took benadryl last night. Seemed to help some.     Review of Systems  Constitutional: Negative for fever, chills and fatigue.  Respiratory: Negative for cough, shortness of breath and wheezing.   Cardiovascular: Negative for chest pain and palpitations.  Skin:       See hpi.  Hematological: Negative for adenopathy. Does not bruise/bleed easily.   Past Medical History  Diagnosis Date  . Premenstrual tension syndromes     gyn uses alprazolam and spironolactone prn ofr treatment  . Benign neoplasm of stomach     gastric polyps  . Stricture and stenosis of esophagus   . Diaphragmatic hernia without mention of obstruction or gangrene   . Diverticulosis of colon (without mention of hemorrhage)   . Unspecified asthma(493.90)   . Esophageal reflux     see GI  . Bronchitis     recurrent  . Allergic rhinitis   . Sinusitis     recurrent  . Osteoarthritis     sees ortho-has had steroid shot in knee  . Post menopausal problems     symptoms  . Insomnia   .  Anxiety   . Psoriasis   . Vitamin D deficiency     Social History   Social History  . Marital Status: Married    Spouse Name: N/A  . Number of Children: 2  . Years of Education: N/A   Occupational History  .      Administrative work   Social History Main Topics  . Smoking status: Never Smoker   . Smokeless tobacco: Never Used  . Alcohol Use: No  . Drug Use: No  . Sexual Activity: Yes     Comment: INTERCOURSE AGE 47, SEXUAL PARTNERS LESS THAN 5   Other Topics Concern  . Not on file   Social History Narrative    Past Surgical History  Procedure Laterality Date  . Tubal ligation  2004    with ablation  . Nasal sinus surgery    . Total knee arthroplasty  11/2009    Right knee  . Cesarean section    . Endometrial ablation      Family History  Problem Relation Age of Onset  . Arthritis Other   . Hypertension Mother   . Diabetes Father   . Hypertension Father   . Cancer Sister     MELANOMA  . Hypertension Maternal Grandfather   .  Diabetes Paternal Grandmother   . Migraines Neg Hx     Allergies  Allergen Reactions  . Macrodantin [Nitrofurantoin Macrocrystal] Itching and Rash  . Cefuroxime Axetil Rash and Other (See Comments)    REACTION: red face/rash  . Penicillins Rash    Current Outpatient Prescriptions on File Prior to Visit  Medication Sig Dispense Refill  . albuterol (PROVENTIL HFA;VENTOLIN HFA) 108 (90 Base) MCG/ACT inhaler Inhale 2 puffs into the lungs every 4 (four) hours as needed for wheezing or shortness of breath. 8 g 1  . ALPRAZolam (XANAX) 0.25 MG tablet TAKE 1 TABLET AT BEDTIME AS NEEDED FOR ANXIETY 90 tablet 1  . ASMANEX 60 METERED DOSES 220 MCG/INH inhaler INHALE 2 PUFFS INTO THE LUNGS DAILY. 3 Inhaler 0  . Cholecalciferol 3000 UNITS TABS Take 1 tablet by mouth daily.    Marland Kitchen EPINEPHrine (EPIPEN 2-PAK) 0.3 mg/0.3 mL IJ SOAJ injection Inject 0.3 mLs (0.3 mg total) into the muscle once. 2 Device 1  . esomeprazole (NEXIUM) 40 MG capsule TAKE 1  CAPSULE BY MOUTH DAILY BEFORE BREAKFAST. 90 capsule 1  . fluticasone (FLONASE) 50 MCG/ACT nasal spray TWO SPRAYS EACH NOSTRIL ONCE A DAY FOR NASAL CONGESTION OR DRAINAGE. 16 g 5  . montelukast (SINGULAIR) 10 MG tablet Take 1 tablet (10 mg total) by mouth at bedtime. 90 tablet 1  . NONFORMULARY OR COMPOUNDED ITEM Estradiol .02% 1 ML Prefilled Applicator Sig: apply vaginally twice a week #90 Day Supply with 4 refills 1 each 4  . spironolactone (ALDACTONE) 25 MG tablet Take 1 tablet (25 mg total) by mouth daily as needed. 90 tablet 4  . traZODone (DESYREL) 100 MG tablet TAKE 1 TABLET (100 MG TOTAL) BY MOUTH AT BEDTIME. 90 tablet 0  . Vitamin D, Ergocalciferol, (DRISDOL) 50000 units CAPS capsule Take one capsule po q other week. 6 capsule 1   No current facility-administered medications on file prior to visit.    BP 123/85 mmHg  Pulse 82  Temp(Src) 98.7 F (37.1 C)  Wt 191 lb (86.637 kg)  SpO2 99%       Objective:   Physical Exam  General  Mental Status - Alert. General Appearance - Well groomed. Not in acute distress.  Skin Mild faint redness to face. Dry flaky skin very prominent behind her ears. Some dry appearance to hands. Left hand most prominent at base of 5th digit.  reft lower ext- below tibial tuberosity region. 3.0 cm circucular reddish dry lesion with flaky skin.  Chest and Lung Exam Auscultation: Breath Sounds:-Clear even and unlabored.  Cardiovascular Auscultation:Rythm- Regular, rate and rhythm. Murmurs & Other Heart Sounds:Ausculatation of the heart reveal- No Murmurs.  Lymphatic Head & Neck General Head & Neck Lymphatics: Bilateral: Description- No Localized lymphadenopathy.       Assessment & Plan:  You have dry feeling skin with history  of rashes consistent with eczema type presentation.  Will rx lac-hydrin to use twice daily.  kenalog cream to use twice to daily to small dry inflamed patches(but not on face).  Taper dose of prednisone and  hydroxyzine for itching.  You will see dermatologist end of March. Follow up here as needed before then.

## 2016-02-09 NOTE — Patient Instructions (Addendum)
You have dry feeling skin with history  of rashes consistent with eczema type presentation.  Will rx lac-hydrin to use twice daily.  kenalog cream to use twice to daily to small dry inflamed patches(but not on face).  Taper dose of prednisone and hydroxyzine for itching.  You will see dermatologist end of March. Follow up here as needed before then.

## 2016-02-09 NOTE — Progress Notes (Signed)
Pre visit review using our clinic review tool, if applicable. No additional management support is needed unless otherwise documented below in the visit note. 

## 2016-02-18 ENCOUNTER — Other Ambulatory Visit: Payer: Self-pay | Admitting: Family

## 2016-02-18 NOTE — Telephone Encounter (Signed)
Last seen 07/06/15 and filled 08/17/15 #90  Please advise      KP

## 2016-02-22 ENCOUNTER — Ambulatory Visit: Payer: 59 | Admitting: Family

## 2016-02-22 ENCOUNTER — Ambulatory Visit (INDEPENDENT_AMBULATORY_CARE_PROVIDER_SITE_OTHER): Payer: PRIVATE HEALTH INSURANCE

## 2016-02-22 DIAGNOSIS — J309 Allergic rhinitis, unspecified: Secondary | ICD-10-CM

## 2016-02-29 ENCOUNTER — Ambulatory Visit (INDEPENDENT_AMBULATORY_CARE_PROVIDER_SITE_OTHER): Payer: 59 | Admitting: Family

## 2016-02-29 ENCOUNTER — Encounter: Payer: Self-pay | Admitting: Family

## 2016-02-29 VITALS — BP 126/62 | HR 75 | Temp 98.1°F | Resp 16 | Ht 66.0 in | Wt 191.6 lb

## 2016-02-29 DIAGNOSIS — L309 Dermatitis, unspecified: Secondary | ICD-10-CM | POA: Diagnosis not present

## 2016-02-29 DIAGNOSIS — E559 Vitamin D deficiency, unspecified: Secondary | ICD-10-CM

## 2016-02-29 DIAGNOSIS — J45909 Unspecified asthma, uncomplicated: Secondary | ICD-10-CM

## 2016-02-29 DIAGNOSIS — F419 Anxiety disorder, unspecified: Secondary | ICD-10-CM | POA: Diagnosis not present

## 2016-02-29 MED ORDER — MONTELUKAST SODIUM 10 MG PO TABS: 10.0000 mg | ORAL_TABLET | Freq: Every day | ORAL | Status: DC

## 2016-02-29 MED ORDER — HYDROXYZINE HCL 25 MG PO TABS: 25.0000 mg | ORAL_TABLET | Freq: Three times a day (TID) | ORAL | Status: DC | PRN

## 2016-02-29 MED ORDER — ALBUTEROL SULFATE HFA 108 (90 BASE) MCG/ACT IN AERS: 2.0000 | INHALATION_SPRAY | RESPIRATORY_TRACT | Status: DC | PRN

## 2016-02-29 MED ORDER — TRIAMCINOLONE ACETONIDE 0.1 % EX CREA: 1.0000 "application " | TOPICAL_CREAM | Freq: Two times a day (BID) | CUTANEOUS | Status: DC

## 2016-02-29 NOTE — Progress Notes (Signed)
Pre visit review using our clinic review tool, if applicable. No additional management support is needed unless otherwise documented below in the visit note. 

## 2016-02-29 NOTE — Patient Instructions (Addendum)
You may use triamcinolone cream to the lesion on your right knee.  For facial itching- ok to use OTC hydrocortisone sparingly, but not near the eyes.   Do not use triamcinolone on your face.

## 2016-02-29 NOTE — Progress Notes (Signed)
Subjective:    Patient ID: Melissa Middleton, female    DOB: 14-Jun-1956, 60 y.o.   MRN: [id number]  HPI  Ms. Critcher is a 60 yr old female who presents today for follow up of multiple medical problems:  1) Asthma- currently using singulair, asmanex and prn albuterol.Reports that this has been wel controlled.  Rarely needing albuterol.    2) Anxiety- She is maintained on prn xanax. She takes HS.  Reports that she is taking trazadone PRN but not on the nights she takes xanax.  Falls asleep easily but sometimes wakes during the night.    3) Vit D deficiency-  She continues vit D 50000 iu daily. This was recently checked by Dr. Toney Rakes (GYN) has been taking every 2 weeks.      Review of Systems  Past Medical History  Diagnosis Date  . Premenstrual tension syndromes     gyn uses alprazolam and spironolactone prn ofr treatment  . Benign neoplasm of stomach     gastric polyps  . Stricture and stenosis of esophagus   . Diaphragmatic hernia without mention of obstruction or gangrene   . Diverticulosis of colon (without mention of hemorrhage)   . Unspecified asthma(493.90)   . Esophageal reflux     see GI  . Bronchitis     recurrent  . Allergic rhinitis   . Sinusitis     recurrent  . Osteoarthritis     sees ortho-has had steroid shot in knee  . Post menopausal problems     symptoms  . Insomnia   . Anxiety   . Psoriasis   . Vitamin D deficiency     Social History   Social History  . Marital Status: Married    Spouse Name: N/A  . Number of Children: 2  . Years of Education: N/A   Occupational History  .      Administrative work   Social History Main Topics  . Smoking status: Never Smoker   . Smokeless tobacco: Never Used  . Alcohol Use: No  . Drug Use: No  . Sexual Activity: Yes     Comment: INTERCOURSE AGE 26, SEXUAL PARTNERS LESS THAN 5   Other Topics Concern  . Not on file   Social History Narrative    Past Surgical History  Procedure Laterality Date    . Tubal ligation  2004    with ablation  . Nasal sinus surgery    . Total knee arthroplasty  11/2009    Right knee  . Cesarean section    . Endometrial ablation      Family History  Problem Relation Age of Onset  . Arthritis Other   . Hypertension Mother   . Diabetes Father   . Hypertension Father   . Cancer Sister     MELANOMA  . Hypertension Maternal Grandfather   . Diabetes Paternal Grandmother   . Migraines Neg Hx     Allergies  Allergen Reactions  . Macrodantin [Nitrofurantoin Macrocrystal] Itching and Rash  . Cefuroxime Axetil Rash and Other (See Comments)    REACTION: red face/rash  . Penicillins Rash    Current Outpatient Prescriptions on File Prior to Visit  Medication Sig Dispense Refill  . albuterol (PROVENTIL HFA;VENTOLIN HFA) 108 (90 Base) MCG/ACT inhaler Inhale 2 puffs into the lungs every 4 (four) hours as needed for wheezing or shortness of breath. 8 g 1  . ALPRAZolam (XANAX) 0.25 MG tablet TAKE 1 TABLET AT BEDTIME AS NEEDED FOR ANXIETY  90 tablet 1  . ammonium lactate (LAC-HYDRIN) 12 % cream Apply to area twice daily 140 g 1  . ASMANEX 60 METERED DOSES 220 MCG/INH inhaler INHALE 2 PUFFS INTO THE LUNGS DAILY. 3 Inhaler 0  . EPINEPHrine (EPIPEN 2-PAK) 0.3 mg/0.3 mL IJ SOAJ injection Inject 0.3 mLs (0.3 mg total) into the muscle once. 2 Device 1  . esomeprazole (NEXIUM) 40 MG capsule TAKE 1 CAPSULE BY MOUTH DAILY BEFORE BREAKFAST. 90 capsule 1  . fluticasone (FLONASE) 50 MCG/ACT nasal spray TWO SPRAYS EACH NOSTRIL ONCE A DAY FOR NASAL CONGESTION OR DRAINAGE. 16 g 5  . hydrOXYzine (ATARAX/VISTARIL) 25 MG tablet Take 1 tablet (25 mg total) by mouth 3 (three) times daily as needed. 30 tablet 0  . montelukast (SINGULAIR) 10 MG tablet Take 1 tablet (10 mg total) by mouth at bedtime. 90 tablet 1  . NONFORMULARY OR COMPOUNDED ITEM Estradiol .02% 1 ML Prefilled Applicator Sig: apply vaginally twice a week #90 Day Supply with 4 refills 1 each 4  . spironolactone  (ALDACTONE) 25 MG tablet Take 1 tablet (25 mg total) by mouth daily as needed. 90 tablet 4  . traZODone (DESYREL) 100 MG tablet TAKE 1 TABLET AT BEDTIME 90 tablet 0  . triamcinolone cream (KENALOG) 0.1 % Apply 1 application topically 2 (two) times daily. 30 g 0  . Vitamin D, Ergocalciferol, (DRISDOL) 50000 units CAPS capsule Take one capsule po q other week. 6 capsule 1   No current facility-administered medications on file prior to visit.    BP 126/62 mmHg  Pulse 75  Temp(Src) 98.1 F (36.7 C) (Oral)  Resp 16  Ht 5\' 6"  (1.676 m)  Wt 191 lb 9.6 oz (86.909 kg)  BMI 30.94 kg/m2  SpO2 99%       Objective:   Physical Exam  Constitutional: She appears well-developed and well-nourished.  Cardiovascular: Normal rate, regular rhythm and normal heart sounds.   No murmur heard. Pulmonary/Chest: Effort normal and breath sounds normal. No respiratory distress. She has no wheezes.  Skin:  Pink round eczematous lesion right knee. Mild dry skin noted on face.   Psychiatric: She has a normal mood and affect. Her behavior is normal. Judgment and thought content normal.          Assessment & Plan:  Eczema- advised pt as follows: You may use triamcinolone cream to the lesion on your right knee.  For facial itching- ok to use OTC hydrocortisone sparingly, but not near the eyes.   Do not use triamcinolone on your face.

## 2016-03-01 NOTE — Assessment & Plan Note (Signed)
Stable on current meds, continue same.  

## 2016-03-01 NOTE — Assessment & Plan Note (Signed)
Stable on current meds.  Continue same. 

## 2016-03-01 NOTE — Assessment & Plan Note (Signed)
She will follow up with Dr. Toney Rakes for vit D level.

## 2016-03-17 ENCOUNTER — Ambulatory Visit: Payer: Self-pay

## 2016-03-22 ENCOUNTER — Ambulatory Visit (INDEPENDENT_AMBULATORY_CARE_PROVIDER_SITE_OTHER): Payer: PRIVATE HEALTH INSURANCE

## 2016-03-22 DIAGNOSIS — J309 Allergic rhinitis, unspecified: Secondary | ICD-10-CM | POA: Diagnosis not present

## 2016-03-29 ENCOUNTER — Ambulatory Visit (INDEPENDENT_AMBULATORY_CARE_PROVIDER_SITE_OTHER): Payer: PRIVATE HEALTH INSURANCE | Admitting: *Deleted

## 2016-03-29 DIAGNOSIS — J309 Allergic rhinitis, unspecified: Secondary | ICD-10-CM | POA: Diagnosis not present

## 2016-04-05 ENCOUNTER — Ambulatory Visit (INDEPENDENT_AMBULATORY_CARE_PROVIDER_SITE_OTHER): Payer: PRIVATE HEALTH INSURANCE | Admitting: *Deleted

## 2016-04-05 DIAGNOSIS — J309 Allergic rhinitis, unspecified: Secondary | ICD-10-CM | POA: Diagnosis not present

## 2016-04-12 ENCOUNTER — Ambulatory Visit (INDEPENDENT_AMBULATORY_CARE_PROVIDER_SITE_OTHER): Payer: PRIVATE HEALTH INSURANCE | Admitting: *Deleted

## 2016-04-12 DIAGNOSIS — J309 Allergic rhinitis, unspecified: Secondary | ICD-10-CM

## 2016-04-20 ENCOUNTER — Ambulatory Visit (INDEPENDENT_AMBULATORY_CARE_PROVIDER_SITE_OTHER): Payer: PRIVATE HEALTH INSURANCE

## 2016-04-20 DIAGNOSIS — J309 Allergic rhinitis, unspecified: Secondary | ICD-10-CM

## 2016-05-10 ENCOUNTER — Ambulatory Visit (INDEPENDENT_AMBULATORY_CARE_PROVIDER_SITE_OTHER): Payer: PRIVATE HEALTH INSURANCE

## 2016-05-10 DIAGNOSIS — J309 Allergic rhinitis, unspecified: Secondary | ICD-10-CM

## 2016-05-13 ENCOUNTER — Other Ambulatory Visit: Payer: Self-pay | Admitting: Family

## 2016-05-13 MED ORDER — ZOSTER VACCINE LIVE 19400 UNT/0.65ML ~~LOC~~ SUSR: 0.6500 mL | Freq: Once | SUBCUTANEOUS | Status: DC

## 2016-05-13 NOTE — Telephone Encounter (Signed)
Pt would like to have a shingles vacc Rx.  Pt says that she has confirmed with her insurance. She says that they will cover once she turned 22.   Pharmacy: [facility]    CB: [phone]

## 2016-05-13 NOTE — Telephone Encounter (Signed)
Melissa Middleton-- I have pended medication for your approval. Please advise.

## 2016-05-17 NOTE — Telephone Encounter (Signed)
Notified pt. 

## 2016-05-17 NOTE — Telephone Encounter (Signed)
Pt states that her current pharmacy is unable to give the shingles vaccine but they may transfer her Rx to another pharmacy that can give vaccines. Pt will let us know if she has any problems getting the vaccine.

## 2016-05-31 ENCOUNTER — Ambulatory Visit (INDEPENDENT_AMBULATORY_CARE_PROVIDER_SITE_OTHER): Payer: PRIVATE HEALTH INSURANCE | Admitting: *Deleted

## 2016-05-31 DIAGNOSIS — J309 Allergic rhinitis, unspecified: Secondary | ICD-10-CM | POA: Diagnosis not present

## 2016-06-22 ENCOUNTER — Ambulatory Visit (INDEPENDENT_AMBULATORY_CARE_PROVIDER_SITE_OTHER): Payer: PRIVATE HEALTH INSURANCE

## 2016-06-22 DIAGNOSIS — J309 Allergic rhinitis, unspecified: Secondary | ICD-10-CM

## 2016-07-05 DIAGNOSIS — J3089 Other allergic rhinitis: Secondary | ICD-10-CM | POA: Diagnosis not present

## 2016-07-06 DIAGNOSIS — J301 Allergic rhinitis due to pollen: Secondary | ICD-10-CM | POA: Diagnosis not present

## 2016-07-12 ENCOUNTER — Ambulatory Visit (INDEPENDENT_AMBULATORY_CARE_PROVIDER_SITE_OTHER): Payer: PRIVATE HEALTH INSURANCE | Admitting: *Deleted

## 2016-07-12 DIAGNOSIS — J309 Allergic rhinitis, unspecified: Secondary | ICD-10-CM | POA: Diagnosis not present

## 2016-08-02 ENCOUNTER — Ambulatory Visit (INDEPENDENT_AMBULATORY_CARE_PROVIDER_SITE_OTHER): Payer: PRIVATE HEALTH INSURANCE

## 2016-08-02 DIAGNOSIS — J309 Allergic rhinitis, unspecified: Secondary | ICD-10-CM

## 2016-08-05 ENCOUNTER — Encounter: Payer: PRIVATE HEALTH INSURANCE | Admitting: Gynecology

## 2016-08-09 ENCOUNTER — Other Ambulatory Visit: Payer: Self-pay | Admitting: Gynecology

## 2016-08-09 ENCOUNTER — Telehealth: Payer: Self-pay | Admitting: *Deleted

## 2016-08-09 DIAGNOSIS — Z1231 Encounter for screening mammogram for malignant neoplasm of breast: Secondary | ICD-10-CM

## 2016-08-09 DIAGNOSIS — Z8041 Family history of malignant neoplasm of ovary: Secondary | ICD-10-CM

## 2016-08-09 NOTE — Telephone Encounter (Signed)
Rosemarie Ax will call and relay, order placed.

## 2016-08-09 NOTE — Telephone Encounter (Signed)
Annual exam first and then follow-up for the ultrasound

## 2016-08-09 NOTE — Telephone Encounter (Signed)
Pt has annual scheduled on 08/23/16, per note on 12/08/15 "I have recommended that beginning next year at time of her annual exam we delivered ultrasound of her pelvis for ovarian cancer screening."  Pt would like to have ultrasound same day of annual, aware insurance may not pay for same day as annual. Pt asked can she have ultrasound before annual or do you prefer have annual first the schedule ultrasound later? Please advise

## 2016-08-12 ENCOUNTER — Other Ambulatory Visit: Payer: Self-pay | Admitting: Family

## 2016-08-16 ENCOUNTER — Ambulatory Visit (HOSPITAL_BASED_OUTPATIENT_CLINIC_OR_DEPARTMENT_OTHER)
Admission: RE | Admit: 2016-08-16 | Discharge: 2016-08-16 | Disposition: A | Discharge status: Home / Self Care | Payer: 59 | Source: Ambulatory Visit | Attending: Gynecology | Admitting: Gynecology

## 2016-08-16 DIAGNOSIS — Z1231 Encounter for screening mammogram for malignant neoplasm of breast: Secondary | ICD-10-CM

## 2016-08-23 ENCOUNTER — Encounter: Payer: Self-pay | Admitting: Gynecology

## 2016-08-23 ENCOUNTER — Ambulatory Visit (INDEPENDENT_AMBULATORY_CARE_PROVIDER_SITE_OTHER): Payer: PRIVATE HEALTH INSURANCE | Admitting: Gynecology

## 2016-08-23 ENCOUNTER — Ambulatory Visit (INDEPENDENT_AMBULATORY_CARE_PROVIDER_SITE_OTHER): Payer: PRIVATE HEALTH INSURANCE

## 2016-08-23 VITALS — BP 130/78 | Ht 65.0 in | Wt 188.0 lb

## 2016-08-23 DIAGNOSIS — Z01419 Encounter for gynecological examination (general) (routine) without abnormal findings: Secondary | ICD-10-CM

## 2016-08-23 DIAGNOSIS — E8770 Fluid overload, unspecified: Secondary | ICD-10-CM

## 2016-08-23 DIAGNOSIS — Z8041 Family history of malignant neoplasm of ovary: Secondary | ICD-10-CM

## 2016-08-23 DIAGNOSIS — Z780 Asymptomatic menopausal state: Secondary | ICD-10-CM

## 2016-08-23 DIAGNOSIS — F419 Anxiety disorder, unspecified: Secondary | ICD-10-CM | POA: Diagnosis not present

## 2016-08-23 DIAGNOSIS — N952 Postmenopausal atrophic vaginitis: Secondary | ICD-10-CM

## 2016-08-23 DIAGNOSIS — Z79890 Hormone replacement therapy (postmenopausal): Secondary | ICD-10-CM | POA: Diagnosis not present

## 2016-08-23 DIAGNOSIS — R609 Edema, unspecified: Secondary | ICD-10-CM

## 2016-08-23 DIAGNOSIS — J309 Allergic rhinitis, unspecified: Secondary | ICD-10-CM

## 2016-08-23 DIAGNOSIS — Z8639 Personal history of other endocrine, nutritional and metabolic disease: Secondary | ICD-10-CM | POA: Diagnosis not present

## 2016-08-23 MED ORDER — ALPRAZOLAM 0.25 MG PO TABS: 0.2500 mg | ORAL_TABLET | Freq: Every evening | ORAL | 1 refills | Status: DC | PRN

## 2016-08-23 MED ORDER — ESTRADIOL 10 MCG VA TABS: 1.0000 | ORAL_TABLET | VAGINAL | 4 refills | Status: DC

## 2016-08-23 MED ORDER — ESTRADIOL 10 MCG VA TABS: 1.0000 | ORAL_TABLET | VAGINAL | 11 refills | Status: DC

## 2016-08-23 MED ORDER — SPIRONOLACTONE 25 MG PO TABS: 25.0000 mg | ORAL_TABLET | Freq: Every day | ORAL | 4 refills | Status: DC | PRN

## 2016-08-23 NOTE — Progress Notes (Signed)
Called into pharmacy

## 2016-08-23 NOTE — Progress Notes (Signed)
Melissa Middleton 09-22-1956 [id number]   History:    60 y.o.  for annual gyn exam with no complaints today but requesting refill on her Xanax which she takes for anxiety on a when necessary basis. Also she has had history vitamin D deficiency in the past currently taking 2000 units daily. We'll check her vitamin D level today. Her PCP has been doing her blood work which she is scheduled for November. She is overdue for bone density study which was normal in 2015.Patient also brought up to my attention that her sister which is several years younger than her was diagnosed recently with stage IC ovarian cancer and is currently undergoing chemotherapy. Review of patient's record we had done an ultrasound here in the office back in 2011 because of vaginismus and her ultrasound was otherwise normal. Last year we had discussed about annual ultrasounds and CA 125 for ovarian cancer screening which she will have this year. Patient with no previous history of any abnormal Pap smears in the past. She had a normal colonoscopy in 2008. She also suffers from vaginal atrophy for which in the past she been using vaginal estrogen and did not get the prescription refill. She denies any vaginal bleeding.  Past medical history,surgical history, family history and social history were all reviewed and documented in the EPIC chart.  Gynecologic History No LMP recorded. Patient has had an ablation. Contraception: post menopausal status Last Pap: 2016. Results were: normal Last mammogram: 2017. Results were: normal  Obstetric History OB History  Gravida Para Term Preterm AB Living  2 2 2     2   SAB TAB Ectopic Multiple Live Births          2    # Outcome Date GA Lbr Len/2nd Weight Sex Delivery Anes PTL Lv  2 Term     M CS-Unspec  N LIV  1 Term     F Vag-Spont  N LIV       ROS: A ROS was performed and pertinent positives and negatives are included in the history.  GENERAL: No fevers or chills. HEENT: No change  in vision, no earache, sore throat or sinus congestion. NECK: No pain or stiffness. CARDIOVASCULAR: No chest pain or pressure. No palpitations. PULMONARY: No shortness of breath, cough or wheeze. GASTROINTESTINAL: No abdominal pain, nausea, vomiting or diarrhea, melena or bright red blood per rectum. GENITOURINARY: No urinary frequency, urgency, hesitancy or dysuria. MUSCULOSKELETAL: No joint or muscle pain, no back pain, no recent trauma. DERMATOLOGIC: No rash, no itching, no lesions. ENDOCRINE: No polyuria, polydipsia, no heat or cold intolerance. No recent change in weight. HEMATOLOGICAL: No anemia or easy bruising or bleeding. NEUROLOGIC: No headache, seizures, numbness, tingling or weakness. PSYCHIATRIC: No depression, no loss of interest in normal activity or change in sleep pattern.     Exam: chaperone present  BP 130/78   Ht 5\' 5"  (1.651 m)   Wt 188 lb (85.3 kg)   BMI 31.28 kg/m   Body mass index is 31.28 kg/m.  General appearance : Well developed well nourished female. No acute distress HEENT: Eyes: no retinal hemorrhage or exudates,  Neck supple, trachea midline, no carotid bruits, no thyroidmegaly Lungs: Clear to auscultation, no rhonchi or wheezes, or rib retractions  Heart: Regular rate and rhythm, no murmurs or gallops Breast:Examined in sitting and supine position were symmetrical in appearance, no palpable masses or tenderness,  no skin retraction, no nipple inversion, no nipple discharge, no skin discoloration, no axillary  or supraclavicular lymphadenopathy Abdomen: no palpable masses or tenderness, no rebound or guarding Extremities: no edema or skin discoloration or tenderness  Pelvic:  Bartholin, Urethra, Skene Glands: Within normal limits             Vagina: No gross lesions or discharge, atrophic changes  Cervix: No gross lesions or discharge  Uterus  anteverted, normal size, shape and consistency, non-tender and mobile  Adnexa  Without masses or tenderness  Anus  and perineum  normal   Rectovaginal  normal sphincter tone without palpated masses or tenderness             Hemoccult cards will be provided     Assessment/Plan:  60 y.o. female for annual exam will return back to the office next week for an ultrasound screen for ovarian cancer due to her sister's history of ovarian cancer. We'll also have her stop by the lab to obtain a CA 125 ovarian cancer screening blood tests along with a vitamin D level because of her past history vitamin D deficiency. Pap smear not indicated this year. Prescription refill for Xanax 0.25 mg to take 1 by mouth daily at bedtime when necessary was provided. We are going to change her vaginal estrogen to Vagifem 10 g tablet intravaginally twice a week. The risks benefits and pros and cons were discussed. We discussed importance of calcium vitamin D and weightbearing exercises for osteoporosis prevention. In October she will need her follow-up bone density study.   Terrance Mass MD, 5:05 PM 08/23/2016  Patient ID: Melissa Middleton, female   DOB: 10-14-1956, 60 y.o.   MRN: [id number]

## 2016-08-23 NOTE — Patient Instructions (Addendum)
CA-125 Tumor Marker Test WHY AM I HAVING THIS TEST? This test is used to check the level of cancer antigen 125 (CA-125) in your blood. The CA-125 tumor marker test can be helpful in detecting ovarian cancer. The test is only performed if you are considered at high risk for ovarian cancer. Your health care provider may recommend this test if:  You have a strong family history of ovarian cancer.  You have a breast cancer antigen (BRCA) genetic defect. If you have already been diagnosed with ovarian cancer, your health care provider may use this test to help identify the extent of the disease and to monitor your response to treatment. WHAT KIND OF SAMPLE IS TAKEN? A blood sample is required for this test. It is usually collected by inserting a needle into a vein. HOW DO I PREPARE FOR THE TEST? There is no preparation required for this test. WHAT ARE THE REFERENCE RANGES? Reference ranges are considered healthy ranges established after testing a large group of healthy people. Reference ranges may vary among different people, labs, and hospitals. It is your responsibility to obtain your test results. Ask the lab or department performing the test when and how you will get your results. The reference range for this test is 0-35 units/mL or less than 35 kunits/L (SI units). WHAT DO THE RESULTS MEAN? Increased levels of CA-125 may indicate:  Certain types of cancer, including:  Ovarian cancer.  Pancreatic cancer.  Colon cancer.  Lung cancer.  Breast cancer.  Lymphoma.  Noncancerous (benign) disorders, including:  Cirrhosis.  Pregnancy.  Endometriosis.  Pancreatitis.  Pelvic inflammatory disease (PID). Talk with your health care provider to discuss your results, treatment options, and if necessary, the need for more tests. Talk with your health care provider if you have any questions about your results.   This information is not intended to replace advice given to you by your  health care provider. Make sure you discuss any questions you have with your health care provider.   Document Released: 12/27/2004 Document Revised: 12/26/2014 Document Reviewed: 04/24/2014 Elsevier Interactive Patient Education 2016 [place]. Bone Densitometry Bone densitometry is an imaging test that uses a special X-ray to measure the amount of calcium and other minerals in your bones (bone density). This test is also known as a bone mineral density test or dual-energy X-ray absorptiometry (DXA). The test can measure bone density at your hip and your spine. It is similar to having a regular X-ray. You may have this test to:  Diagnose a condition that causes weak or thin bones (osteoporosis).  Predict your risk of a broken bone (fracture).  Determine how well osteoporosis treatment is working. LET [facility] CARE PROVIDER KNOW ABOUT:  Any allergies you have.  All medicines you are taking, including vitamins, herbs, eye drops, creams, and over-the-counter medicines.  Previous problems you or members of your family have had with the use of anesthetics.  Any blood disorders you have.  Previous surgeries you have had.  Medical conditions you have.  Possibility of pregnancy.  Any other medical test you had within the previous 14 days that used contrast material. RISKS AND COMPLICATIONS Generally, this is a safe procedure. However, problems can occur and may include the following:  This test exposes you to a very small amount of radiation.  The risks of radiation exposure may be greater to unborn children. BEFORE THE PROCEDURE  Do not take any calcium supplements for 24 hours before having the test. You can otherwise  eat and drink what you usually do.  Take off all metal jewelry, eyeglasses, dental appliances, and any other metal objects. PROCEDURE  You may lie on an exam table. There will be an X-ray generator below you and an imaging device above you.  Other devices,  such as boxes or braces, may be used to position your body properly for the scan.  You will need to lie still while the machine slowly scans your body.  The images will show up on a computer monitor. AFTER THE PROCEDURE You may need more testing at a later time.   This information is not intended to replace advice given to you by your health care provider. Make sure you discuss any questions you have with your health care provider.   Document Released: 12/27/2004 Document Revised: 12/26/2014 Document Reviewed: 05/15/2014 Elsevier Interactive Patient Education 2016 Elsevier Inc. Estradiol vaginal tablets What is this medicine? ESTRADIOL (es tra DYE ole) vaginal tablet is used to help relieve symptoms of vaginal irritation and dryness that occurs in some women during menopause. This medicine may be used for other purposes; ask your health care provider or pharmacist if you have questions. What should I tell my health care provider before I take this medicine? They need to know if you have any of these conditions: -abnormal vaginal bleeding -blood vessel disease or blood clots -breast, cervical, endometrial, ovarian, liver, or uterine cancer -dementia -diabetes -gallbladder disease -heart disease or recent heart attack -high blood pressure -high cholesterol -high level of calcium in the blood -hysterectomy -kidney disease -liver disease -migraine headaches -protein C deficiency -protein S deficiency -stroke -systemic lupus erythematosus (SLE) -tobacco smoker -an unusual or allergic reaction to estrogens, other hormones, medicines, foods, dyes, or preservatives -pregnant or trying to get pregnant -breast-feeding How should I use this medicine? This medicine is only for use in the vagina. Do not take by mouth. Wash and dry your hands before and after use. Read package directions carefully. Unwrap the applicator package. Be sure to use a new applicator for each dose. Use at the  same time each day. If the tablet has fallen out of the applicator, but is still in the package, carefully place it back into the applicator. If the tablet has fallen out of the package, that applicator should be thrown out and you should use a new applicator containing a new tablet. Lie on your back, part and bend your knees. Gently insert the applicator as far as comfortably possible into the vagina. Then, gently press the plunger until the plunger is fully depressed. This will release the tablet into the vagina. Gently remove the applicator. Throw away the applicator after use. Do not use your medicine more often than directed. Do not stop using except on the advice of your doctor or health care professional. Talk to your pediatrician regarding the use of this medicine in children. This medicine is not approved for use in children. A patient package insert for the product will be given with each prescription and refill. Read this sheet carefully each time. The sheet may change frequently. Overdosage: If you think you have taken too much of this medicine contact a poison control center or emergency room at once. NOTE: This medicine is only for you. Do not share this medicine with others. What if I miss a dose? If you miss a dose, take it as soon as you can. If it is almost time for your next dose, take only that dose. Do not take double or  extra doses. What may interact with this medicine? Do not take this medicine with any of the following medications: -aromatase inhibitors like aminoglutethimide, anastrozole, exemestane, letrozole, testolactone This medicine may also interact with the following medications: -antibiotics used to treat tuberculosis like rifabutin, rifampin and rifapentene -raloxifene or tamoxifen -warfarin This list may not describe all possible interactions. Give your health care provider a list of all the medicines, herbs, non-prescription drugs, or dietary supplements you use.  Also tell them if you smoke, drink alcohol, or use illegal drugs. Some items may interact with your medicine. What should I watch for while using this medicine? Visit your health care professional for regular checks on your progress. You will need a regular breast and pelvic exam. You should also discuss the need for regular mammograms with your health care professional, and follow his or her guidelines. This medicine can make your body retain fluid, making your fingers, hands, or ankles swell. Your blood pressure can go up. Contact your doctor or health care professional if you feel you are retaining fluid. If you have any reason to think you are pregnant; stop taking this medicine at once and contact your doctor or health care professional. Tobacco smoking increases the risk of getting a blood clot or having a stroke, especially if you are more than 60 years old. You are strongly advised not to smoke. If you wear contact lenses and notice visual changes, or if the lenses begin to feel uncomfortable, consult your eye care specialist. If you are going to have elective surgery, you may need to stop taking this medicine beforehand. Consult your health care professional for advice prior to scheduling the surgery. What side effects may I notice from receiving this medicine? Side effects that you should report to your doctor or health care professional as soon as possible: -allergic reactions like skin rash, itching or hives, swelling of the face, lips, or tongue -breast tissue changes or discharge -changes in vision -chest pain -confusion, trouble speaking or understanding -dark urine -general ill feeling or flu-like symptoms -light-colored stools -nausea, vomiting -pain, swelling, warmth in the leg -right upper belly pain -severe headaches -shortness of breath -sudden numbness or weakness of the face, arm or leg -trouble walking, dizziness, loss of balance or coordination -unusual vaginal  bleeding -yellowing of the eyes or skin Side effects that usually do not require medical attention (report to your doctor or health care professional if they continue or are bothersome): -hair loss -increased hunger or thirst -increased urination -symptoms of vaginal infection like itching, irritation or unusual discharge -unusually weak or tired This list may not describe all possible side effects. Call your doctor for medical advice about side effects. You may report side effects to FDA at 1-800-FDA-1088. Where should I keep my medicine? Keep out of the reach of children. Store at room temperature between 15 and 30 degrees C (59 and 86 degrees F). Throw away any unused medicine after the expiration date. NOTE: This sheet is a summary. It may not cover all possible information. If you have questions about this medicine, talk to your doctor, pharmacist, or health care provider.    2016, Elsevier/Gold Standard. (2014-11-19 09:22:51)

## 2016-08-24 LAB — CA 125: CA 125: 13 U/mL (ref <35)

## 2016-08-24 LAB — VITAMIN D 25 HYDROXY (VIT D DEFICIENCY, FRACTURES): Vit D, 25-Hydroxy: 49 ng/mL (ref 30–100)

## 2016-08-30 ENCOUNTER — Ambulatory Visit (INDEPENDENT_AMBULATORY_CARE_PROVIDER_SITE_OTHER): Payer: PRIVATE HEALTH INSURANCE

## 2016-08-30 DIAGNOSIS — J309 Allergic rhinitis, unspecified: Secondary | ICD-10-CM | POA: Diagnosis not present

## 2016-09-06 ENCOUNTER — Ambulatory Visit (INDEPENDENT_AMBULATORY_CARE_PROVIDER_SITE_OTHER): Payer: PRIVATE HEALTH INSURANCE

## 2016-09-06 DIAGNOSIS — J309 Allergic rhinitis, unspecified: Secondary | ICD-10-CM | POA: Diagnosis not present

## 2016-09-07 ENCOUNTER — Ambulatory Visit: Payer: PRIVATE HEALTH INSURANCE

## 2016-09-07 ENCOUNTER — Ambulatory Visit (INDEPENDENT_AMBULATORY_CARE_PROVIDER_SITE_OTHER): Payer: PRIVATE HEALTH INSURANCE | Admitting: Gynecology

## 2016-09-07 ENCOUNTER — Encounter: Payer: Self-pay | Admitting: Gynecology

## 2016-09-07 VITALS — BP 130/80

## 2016-09-07 DIAGNOSIS — Z8041 Family history of malignant neoplasm of ovary: Secondary | ICD-10-CM | POA: Diagnosis not present

## 2016-09-07 DIAGNOSIS — N3281 Overactive bladder: Secondary | ICD-10-CM

## 2016-09-07 MED ORDER — MIRABEGRON ER 25 MG PO TB24: 25.0000 mg | ORAL_TABLET | Freq: Every day | ORAL | 11 refills | Status: DC

## 2016-09-07 NOTE — Progress Notes (Signed)
   Patient is a 60 year old was seen in the office early this month for her annual exam. She is here as part of her annual screening ultrasound and CA 125 because of her sister's history of ovarian cancer whereby she was diagnosed with stage IC and is currently going chemotherapy. Patient CA 125 here in the office early this month was normal. She brought to my attention that she has a lot of urgency during the day if she does not get to the bathroom time she may leak some urine. She also has to get up at night once and up to 2 times to urinate. She denies any dysuria, back pain, fever, chills, nausea, vomiting.  Ultrasound today: Uterus measured 9.0 x 5.6 x 4.97 m with endometrial stripe of 2.7 mm. No apparent adnexal masses. A small right involuting follicle 9 x 6 mm was noted. Left ovary was normal.  Assessment/plan: #1 patient's sister with history of ovarian cancer. Patient's ultrasound and CA 125 urine the office normal. 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 She will be a candidate for Myrbetriq 25 mg daily to help with her symptoms. Patient has no history of hypertension or closed angle call,. The risks benefits and pros and cons of medication were discussed and literature information was provided.

## 2016-09-07 NOTE — Patient Instructions (Signed)
Mirabegron extended-release tablets What is this medicine? MIRABEGRON (MIR a BEG ron) is used to treat overactive bladder. This medicine reduces the amount of bathroom visits. It Bonebrake also help to control wetting accidents. This medicine Trost be used for other purposes; ask your health care provider or pharmacist if you have questions. What should I tell my health care provider before I take this medicine? They need to know if you have any of these conditions: -difficulty passing urine -high blood pressure -kidney disease -liver disease -an unusual or allergic reaction to mirabegron, other medicines, foods, dyes, or preservatives -pregnant or trying to get pregnant -breast-feeding How should I use this medicine? Take this medicine by mouth with a glass of water. Follow the directions on the prescription label. Do not cut, crush or chew this medicine. You can take it with or without food. If it upsets your stomach, take it with food. Take your medicine at regular intervals. Do not take it more often than directed. Do not stop taking except on your doctor's advice. Talk to your pediatrician regarding the use of this medicine in children. Special care Jamerson be needed. Overdosage: If you think you have taken too much of this medicine contact a poison control center or emergency room at once. NOTE: This medicine is only for you. Do not share this medicine with others. What if I miss a dose? If you miss a dose, take it as soon as you can. If it is almost time for your next dose, take only that dose. Do not take double or extra doses. What Coccia interact with this medicine? -certain medicines for bladder problems like fesoterodine, oxybutynin, solifenacin, tolterodine -desipramine -digoxin -flecainide -ketoconazole -MAOIs like Carbex, Eldepryl, Marplan, Nardil, and Parnate -metoprolol -propafenone -thioridazine -warfarin This list Oregon not describe all possible interactions. Give your health care  provider a list of all the medicines, herbs, non-prescription drugs, or dietary supplements you use. Also tell them if you smoke, drink alcohol, or use illegal drugs. Some items Miotke interact with your medicine. What should I watch for while using this medicine? It Hendel take 8 weeks to notice the full benefit from this medicine. You Cassar need to limit your intake tea, coffee, caffeinated sodas, and alcohol. These drinks Markert make your symptoms worse. Visit your doctor or health care professional for regular checks on your progress. Check your blood pressure as directed. Ask your doctor or health care professional what your blood pressure should be and when you should contact him or her. What side effects Shadoan I notice from receiving this medicine? Side effects that you should report to your doctor or health care professional as soon as possible: -allergic reactions like skin rash, itching or hives, swelling of the face, lips, or tongue -chest pain or palpitations -severe or sudden headache -high blood pressure -fast, irregular heartbeat -redness, blistering, peeling or loosening of the skin, including inside the mouth -signs of infection like fever or chills; cough; sore throat; pain or difficulty passing urine -trouble passing urine or change in the amount of urine Side effects that usually do not require medical attention (Report these to your doctor or health care professional if they continue or are bothersome.): -constipation -diarrhea -dizziness -dry eyes -joint pain -mild headache -nausea -runny nose This list Mundorf not describe all possible side effects. Call your doctor for medical advice about side effects. You Trim report side effects to FDA at 1-800-FDA-1088. Where should I keep my medicine? Keep out of the reach of children. Store   at room temperature between 15 and 30 degrees C (59 and 86 degrees F). Throw away any unused medicine after the expiration date. NOTE: This sheet is a  summary. It may not cover all possible information. If you have questions about this medicine, talk to your doctor, pharmacist, or health care provider.    2016, Elsevier/Gold Standard. (2015-08-06 10:22:20) Overactive Bladder, Adult Overactive bladder is a group of urinary symptoms. With overactive bladder, you may suddenly feel the need to pass urine (urinate) right away. After feeling this sudden urge, you might also leak urine if you cannot get to the bathroom fast enough (urinary incontinence). These symptoms might interfere with your daily work or social activities. Overactive bladder symptoms may also wake you up at night. Overactive bladder affects the nerve signals between your bladder and your brain. Your bladder may get the signal to empty before it is full. Very sensitive muscles can also make your bladder squeeze too soon. CAUSES Many things can cause an overactive bladder. Possible causes include:  Urinary tract infection.  Infection of nearby tissues, such as the prostate.  Prostate enlargement.  Being pregnant with twins or more (multiples).  Surgery on the uterus or urethra.  Bladder stones, inflammation, or tumors.  Drinking too much caffeine or alcohol.  Certain medicines, especially those that you take to help your body get rid of extra fluid (diuretics) by increasing urine production.  Muscle or nerve weakness, especially from:  A spinal cord injury.  Stroke.  Multiple sclerosis.  Parkinson disease.  Diabetes. This can cause a high urine volume that fills the bladder so quickly that the normal urge to urinate is triggered very strongly.  Constipation. A buildup of too much stool can put pressure on your bladder. RISK FACTORS You may be at greater risk for overactive bladder if you:  Are an older adult.  Smoke.  Are going through menopause.  Have prostate problems.  Have a neurological disease, such as stroke, dementia, Parkinson disease, or  multiple sclerosis (MS).  Eat or drink things that irritate the bladder. These include alcohol, spicy food, and caffeine.  Are overweight or obese. SIGNS AND SYMPTOMS  The signs and symptoms of an overactive bladder include:  Sudden, strong urges to urinate.  Leaking urine.  Urinating eight or more times per day.  Waking up to urinate two or more times per night. DIAGNOSIS Your health care provider may suspect overactive bladder based on your symptoms. The health care provider will do a physical exam and take your medical history. Blood or urine tests may also be done. For example, you might need to have a bladder function test to check how well you can hold your urine. You might also need to see a health care provider who specializes in the urinary tract (urologist). TREATMENT Treatment for overactive bladder depends on the cause of your condition and whether it is mild or severe. Certain treatments can be done in your health care provider's office or clinic. You can also make lifestyle changes at home. Options include: Behavioral Treatments  Biofeedback. A specialist uses sensors to help you become aware of your body's signals.  Keeping a daily log of when you need to urinate and what happens after the urge. This may help you manage your condition.  Bladder training. This helps you learn to control the urge to urinate by following a schedule that directs you to urinate at regular intervals (timed voiding). At first, you might have to wait a few minutes after  feeling the urge. In time, you should be able to schedule bathroom visits an hour or more apart.  Kegel exercises. These are exercises to strengthen the pelvic floor muscles, which support the bladder. Toning these muscles can help you control urination, even if your bladder muscles are overactive. A specialist will teach you how to do these exercises correctly. They require daily practice.  Weight loss. If you are obese or  overweight, losing weight might relieve your symptoms of overactive bladder. Talk to your health care provider about losing weight and whether there is a specific program or method that would work best for you.  Diet change. This might help if constipation is making your overactive bladder worse. Your health care provider or a dietitian can explain ways to change what you eat to ease constipation. You might also need to consume less alcohol and caffeine or drink other fluids at different times of the day.  Stopping smoking.  Wearing pads to absorb leakage while you wait for other treatments to take effect. Physical Treatments  Electrical stimulation. Electrodes send gentle pulses of electricity to strengthen the nerves or muscles that help to control the bladder. Sometimes, the electrodes are placed outside of the body. In other cases, they might be placed inside the body (implanted). This treatment can take several months to have an effect.  Supportive devices. Women may need a plastic device that fits into the vagina and supports the bladder (pessary). Medicines Several medicines can help treat overactive bladder and are usually used along with other treatments. Some are injected into the muscles involved in urination. Others come in pill form. Your health care provider may prescribe:  Antispasmodics. These medicines block the signals that the nerves send to the bladder. This keeps the bladder from releasing urine at the wrong time.  Tricyclic antidepressants. These types of antidepressants also relax bladder muscles. Surgery  You may have a device implanted to help manage the nerve signals that indicate when you need to urinate.  You may have surgery to implant electrodes for electrical stimulation.  Sometimes, very severe cases of overactive bladder require surgery to change the shape of the bladder. HOME CARE INSTRUCTIONS   Take medicines only as directed by your health care  provider.  Use any implants or a pessary as directed by your health care provider.  Make any diet or lifestyle changes that are recommended by your health care provider. These might include:  Drinking less fluid or drinking at different times of the day. If you need to urinate often during the night, you may need to stop drinking fluids early in the evening.  Cutting down on caffeine or alcohol. Both can make an overactive bladder worse. Caffeine is found in coffee, tea, and sodas.  Doing Kegel exercises to strengthen muscles.  Losing weight if you need to.  Eating a healthy and balanced diet to prevent constipation.  Keep a journal or log to track how much and when you drink and also when you feel the need to urinate. This will help your health care provider to monitor your condition. SEEK MEDICAL CARE IF:  Your symptoms do not get better after treatment.  Your pain and discomfort are getting worse.  You have more frequent urges to urinate.  You have a fever. SEEK IMMEDIATE MEDICAL CARE IF: You are not able to control your bladder at all.   This information is not intended to replace advice given to you by your health care provider. Make sure you  discuss any questions you have with your health care provider.   Document Released: 10/01/2009 Document Revised: 12/26/2014 Document Reviewed: 04/30/2014 Elsevier Interactive Patient Education [employer].

## 2016-09-09 ENCOUNTER — Telehealth: Payer: Self-pay | Admitting: *Deleted

## 2016-09-09 MED ORDER — MIRABEGRON ER 25 MG PO TB24: 25.0000 mg | ORAL_TABLET | Freq: Every day | ORAL | 3 refills | Status: DC

## 2016-09-09 NOTE — Telephone Encounter (Signed)
Pt Rx for Mybetriq 25 mg will be cheaper for 90 day supply rather 30 day supply. Rx will be sent. Pt aware.

## 2016-09-13 ENCOUNTER — Ambulatory Visit (INDEPENDENT_AMBULATORY_CARE_PROVIDER_SITE_OTHER): Payer: PRIVATE HEALTH INSURANCE

## 2016-09-13 DIAGNOSIS — J309 Allergic rhinitis, unspecified: Secondary | ICD-10-CM | POA: Diagnosis not present

## 2016-09-20 ENCOUNTER — Ambulatory Visit (INDEPENDENT_AMBULATORY_CARE_PROVIDER_SITE_OTHER): Payer: PRIVATE HEALTH INSURANCE | Admitting: *Deleted

## 2016-09-20 DIAGNOSIS — J309 Allergic rhinitis, unspecified: Secondary | ICD-10-CM | POA: Diagnosis not present

## 2016-09-28 ENCOUNTER — Other Ambulatory Visit: Payer: Self-pay | Admitting: Family

## 2016-09-29 ENCOUNTER — Telehealth: Payer: Self-pay | Admitting: Family

## 2016-09-29 NOTE — Telephone Encounter (Signed)
Patient is requesting a refill of esomeprazole (NEXIUM) 40 MG capsule   Patient phone: [phone] Pharmacy: [id number] - [place], [place] - [address]

## 2016-09-30 MED ORDER — ESOMEPRAZOLE MAGNESIUM 40 MG PO CPDR: DELAYED_RELEASE_CAPSULE | ORAL | 0 refills | Status: DC

## 2016-10-10 ENCOUNTER — Ambulatory Visit (INDEPENDENT_AMBULATORY_CARE_PROVIDER_SITE_OTHER): Payer: 59 | Admitting: Family

## 2016-10-10 ENCOUNTER — Encounter: Payer: Self-pay | Admitting: Family

## 2016-10-10 VITALS — BP 122/82 | HR 73 | Temp 98.3°F | Resp 16 | Ht 66.0 in | Wt 190.8 lb

## 2016-10-10 DIAGNOSIS — M1990 Unspecified osteoarthritis, unspecified site: Secondary | ICD-10-CM

## 2016-10-10 DIAGNOSIS — F419 Anxiety disorder, unspecified: Secondary | ICD-10-CM | POA: Diagnosis not present

## 2016-10-10 DIAGNOSIS — K219 Gastro-esophageal reflux disease without esophagitis: Secondary | ICD-10-CM

## 2016-10-10 DIAGNOSIS — J45909 Unspecified asthma, uncomplicated: Secondary | ICD-10-CM | POA: Diagnosis not present

## 2016-10-10 NOTE — Progress Notes (Signed)
Subjective:    Patient ID: Melissa Middleton, female    DOB: 12/22/1955, 60 y.o.   MRN: [id number]  HPI  Melissa Middleton is a 60 yr old female who presents today for follow up.  1) anxiety- reports that she has had a lot of stress.  Work is stressful.    2) GERD-maintained on nexium. Stable.   3) Joint pain- reports "pain all over" x 2 weeks. Has swelling in hands x 1 week.   4) Psoriasis-  She is using knalog (left lateral small finger, righ tknee) also has rrash beneath her breasts. Reports that for the past few months her rings have felt tighter. She notes some increased swlling at the base of the 1st and second   5) Asthma- she has not been using asthmanex recently. Stable.   Review of Systems See HPI  Past Medical History:  Diagnosis Date  . Allergic rhinitis   . Anxiety   . Benign neoplasm of stomach    gastric polyps  . Bronchitis    recurrent  . Diaphragmatic hernia without mention of obstruction or gangrene   . Diverticulosis of colon (without mention of hemorrhage)   . Esophageal reflux    see GI  . Insomnia   . Osteoarthritis    sees ortho-has had steroid shot in knee  . Post menopausal problems    symptoms  . Premenstrual tension syndromes    gyn uses alprazolam and spironolactone prn ofr treatment  . Psoriasis   . Sinusitis    recurrent  . Stricture and stenosis of esophagus   . Unspecified asthma(493.90)   . Vitamin D deficiency      Social History   Social History  . Marital status: Married    Spouse name: N/A  . Number of children: 2  . Years of education: N/A   Occupational History  .  Unemployed    Administrative work   Social History Main Topics  . Smoking status: Never Smoker  . Smokeless tobacco: Never Used  . Alcohol use No  . Drug use: No  . Sexual activity: Yes     Comment: INTERCOURSE AGE 37, SEXUAL PARTNERS LESS THAN 5   Other Topics Concern  . Not on file   Social History Narrative  . No narrative on file    Past  Surgical History:  Procedure Laterality Date  . CESAREAN SECTION    . ENDOMETRIAL ABLATION    . NASAL SINUS SURGERY    . TOTAL KNEE ARTHROPLASTY  11/2009   Right knee  . TUBAL LIGATION  2004   with ablation    Family History  Problem Relation Age of Onset  . Arthritis Other   . Hypertension Mother   . Diabetes Father   . Hypertension Father   . Cancer Sister     MELANOMA  . Hypertension Maternal Grandfather   . Diabetes Paternal Grandmother   . Migraines Neg Hx     Allergies  Allergen Reactions  . Macrodantin [Nitrofurantoin Macrocrystal] Itching and Rash  . Cefuroxime Axetil Rash and Other (See Comments)    REACTION: red face/rash  . Penicillins Rash    Current Outpatient Prescriptions on File Prior to Visit  Medication Sig Dispense Refill  . albuterol (PROVENTIL HFA;VENTOLIN HFA) 108 (90 Base) MCG/ACT inhaler Inhale 2 puffs into the lungs every 4 (four) hours as needed for wheezing or shortness of breath. 8 g 1  . ALPRAZolam (XANAX) 0.25 MG tablet Take 1 tablet (0.25 mg total)  by mouth at bedtime as needed for anxiety. 90 tablet 1  . ammonium lactate (LAC-HYDRIN) 12 % cream Apply to area twice daily 140 g 1  . ASMANEX 60 METERED DOSES 220 MCG/INH inhaler INHALE 2 PUFFS INTO THE LUNGS DAILY. (Patient taking differently: INHALE 2 PUFFS INTO THE LUNGS DAILY, AS NEEDED) 3 Inhaler 0  . EPINEPHrine (EPIPEN 2-PAK) 0.3 mg/0.3 mL IJ SOAJ injection Inject 0.3 mLs (0.3 mg total) into the muscle once. 2 Device 1  . esomeprazole (NEXIUM) 40 MG capsule TAKE 1 CAPSULE BY MOUTH DAILY BEFORE BREAKFAST. 90 capsule 0  . Estradiol 10 MCG TABS vaginal tablet Place 1 tablet (10 mcg total) vaginally 2 (two) times a week. 12 tablet 4  . fluticasone (FLONASE) 50 MCG/ACT nasal spray TWO SPRAYS EACH NOSTRIL ONCE A DAY FOR NASAL CONGESTION OR DRAINAGE. 16 g 5  . montelukast (SINGULAIR) 10 MG tablet TAKE 1 TABLET AT BEDTIME 90 tablet 0  . spironolactone (ALDACTONE) 25 MG tablet Take 1 tablet (25 mg  total) by mouth daily as needed. 90 tablet 4  . traZODone (DESYREL) 100 MG tablet TAKE 1 TABLET AT BEDTIME 90 tablet 0  . triamcinolone cream (KENALOG) 0.1 % Apply 1 application topically 2 (two) times daily. 15 g 0   No current facility-administered medications on file prior to visit.     BP 122/82 (BP Location: Right Arm, Cuff Size: Normal)   Pulse 73   Temp 98.3 F (36.8 C) (Oral)   Resp 16   Ht 5\' 6"  (1.676 m)   Wt 190 lb 12.8 oz (86.5 kg)   SpO2 97% Comment: room air  BMI 30.80 kg/m       Objective:   Physical Exam  Constitutional: She is oriented to person, place, and time. She appears well-developed and well-nourished.  Cardiovascular: Normal rate, regular rhythm and normal heart sounds.   No murmur heard. Pulmonary/Chest: Effort normal and breath sounds normal. No respiratory distress. She has no wheezes.  Musculoskeletal:  + swelling of bilateral 1st and second MCP joints  Neurological: She is alert and oriented to person, place, and time.  Skin: Skin is warm and dry.  Psychiatric: She has a normal mood and affect. Her behavior is normal. Judgment and thought content normal.          Assessment & Plan:  Arthritis-  ? Psoriatic arthritis. Will refer her to rheumatology for further evaluation.

## 2016-10-10 NOTE — Progress Notes (Signed)
Pre visit review using our clinic review tool, if applicable. No additional management support is needed unless otherwise documented below in the visit note. 

## 2016-10-10 NOTE — Patient Instructions (Signed)
You will be contacted about your referral to rheumatology.

## 2016-10-11 ENCOUNTER — Ambulatory Visit (INDEPENDENT_AMBULATORY_CARE_PROVIDER_SITE_OTHER): Payer: PRIVATE HEALTH INSURANCE | Admitting: *Deleted

## 2016-10-11 DIAGNOSIS — J309 Allergic rhinitis, unspecified: Secondary | ICD-10-CM | POA: Diagnosis not present

## 2016-10-12 NOTE — Assessment & Plan Note (Signed)
Stable on nexium.  Continue same.

## 2016-10-12 NOTE — Assessment & Plan Note (Signed)
Reports that she has some work stress.  Overall anxiety is stable Uses xanax HS prn and this is prescribed by her gynecologist.

## 2016-10-12 NOTE — Assessment & Plan Note (Signed)
Stable.  Monitor.  

## 2016-10-17 ENCOUNTER — Ambulatory Visit (INDEPENDENT_AMBULATORY_CARE_PROVIDER_SITE_OTHER): Payer: PRIVATE HEALTH INSURANCE

## 2016-10-17 DIAGNOSIS — Z13820 Encounter for screening for osteoporosis: Secondary | ICD-10-CM

## 2016-10-17 DIAGNOSIS — Z79890 Hormone replacement therapy (postmenopausal): Secondary | ICD-10-CM

## 2016-10-17 DIAGNOSIS — Z8639 Personal history of other endocrine, nutritional and metabolic disease: Secondary | ICD-10-CM

## 2016-10-17 DIAGNOSIS — Z780 Asymptomatic menopausal state: Secondary | ICD-10-CM

## 2016-10-18 ENCOUNTER — Other Ambulatory Visit: Payer: Self-pay | Admitting: Gynecology

## 2016-10-18 DIAGNOSIS — Z8639 Personal history of other endocrine, nutritional and metabolic disease: Secondary | ICD-10-CM

## 2016-10-18 DIAGNOSIS — Z79890 Hormone replacement therapy (postmenopausal): Secondary | ICD-10-CM

## 2016-10-18 DIAGNOSIS — Z13820 Encounter for screening for osteoporosis: Secondary | ICD-10-CM

## 2016-10-24 ENCOUNTER — Encounter: Payer: 59 | Admitting: Family

## 2016-11-01 ENCOUNTER — Ambulatory Visit (INDEPENDENT_AMBULATORY_CARE_PROVIDER_SITE_OTHER): Payer: PRIVATE HEALTH INSURANCE | Admitting: Pediatrics

## 2016-11-01 ENCOUNTER — Ambulatory Visit: Payer: Self-pay

## 2016-11-01 ENCOUNTER — Encounter: Payer: Self-pay | Admitting: Pediatrics

## 2016-11-01 VITALS — BP 110/80 | HR 81 | Temp 97.9°F | Resp 16

## 2016-11-01 DIAGNOSIS — J4540 Moderate persistent asthma, uncomplicated: Secondary | ICD-10-CM

## 2016-11-01 DIAGNOSIS — J309 Allergic rhinitis, unspecified: Secondary | ICD-10-CM

## 2016-11-01 DIAGNOSIS — K219 Gastro-esophageal reflux disease without esophagitis: Secondary | ICD-10-CM

## 2016-11-01 DIAGNOSIS — J3089 Other allergic rhinitis: Secondary | ICD-10-CM

## 2016-11-01 MED ORDER — MONTELUKAST SODIUM 10 MG PO TABS: 10.0000 mg | ORAL_TABLET | Freq: Every day | ORAL | 5 refills | Status: DC

## 2016-11-01 NOTE — Progress Notes (Signed)
  [place] [address] Dept: [phone]  FOLLOW UP NOTE  Patient ID: Melissa Middleton, female    DOB: 09-26-1956  Age: 60 y.o. MRN: [id number] Date of Office Visit: 11/01/2016  Assessment  Chief Complaint: Asthma and Allergies  HPI [name] presents for follow-up of asthma and allergic rhinitis. Her asthma has been well controlled. Her nasal symptoms are well controlled. She is on allergy injections every 3 weeks. She has had a flu vaccination  Current medications will be summarized in her after visit summary. Her other current medications are outlined in the chart.   Drug Allergies:  Allergies  Allergen Reactions  . Macrodantin [Nitrofurantoin Macrocrystal] Itching and Rash  . Cefuroxime Axetil Rash and Other (See Comments)    REACTION: red face/rash  . Penicillins Rash    Physical Exam: BP 110/80   Pulse 81   Temp 97.9 F (36.6 C) (Oral)   Resp 16   SpO2 96%    Physical Exam  Constitutional: She is oriented to person, place, and time. She appears well-developed and well-nourished.  HENT:  Eyes normal. Ears normal. Nose normal. Pharynx normal.  Neck: Neck supple.  Cardiovascular:  S1 and S2 normal no murmurs  Pulmonary/Chest:  Clear to percussion auscultation  Neurological: She is alert and oriented to person, place, and time.  Psychiatric: She has a normal mood and affect. Her behavior is normal. Judgment and thought content normal.  Vitals reviewed.   Diagnostics:  FVC 3.09 L FEV1 2.40 L. Predicted FVC 2.57 L predicted FEV1 2.76 L-the spirometry is in the normal range  Assessment and Plan: 1. Moderate persistent asthma without complication   2. Other allergic rhinitis   3. Gastroesophageal reflux disease without esophagitis     Meds ordered this encounter  Medications  . montelukast (SINGULAIR) 10 MG tablet    Sig: Take 1 tablet (10 mg total) by mouth at bedtime.    Dispense:  30 tablet    Refill:  5    On hold, patient  will call.    Patient Instructions  Zyrtec 10 mg once a day if needed for runny nose Fluticasone 2 sprays per nostril once a day if needed for stuffy nose Pro-air 2 puffs every 4 hours if needed for wheezing or coughing spells Asmanex 220-2 puffs once a day to prevent coughing or wheezing Montelukast 10 mg once a day for coughing or wheezing Esomeprazole 40 mg once a day for reflux Continue your other medications Allergy injections every 4 weeks for the next year   Return in about 1 year (around 11/01/2017).    Thank you for the opportunity to care for this patient.  Please do not hesitate to contact me with questions.  Penne Lash, M.D.  [facility] [address] [place], [place] [address] [phone]

## 2016-11-01 NOTE — Patient Instructions (Signed)
Zyrtec 10 mg once a day if needed for runny nose Fluticasone 2 sprays per nostril once a day if needed for stuffy nose Pro-air 2 puffs every 4 hours if needed for wheezing or coughing spells Asmanex 220-2 puffs once a day to prevent coughing or wheezing Montelukast 10 mg once a day for coughing or wheezing Esomeprazole 40 mg once a day for reflux Continue your other medications Allergy injections every 4 weeks for the next year

## 2016-11-09 ENCOUNTER — Other Ambulatory Visit: Payer: Self-pay | Admitting: Family

## 2016-11-16 DIAGNOSIS — J3089 Other allergic rhinitis: Secondary | ICD-10-CM | POA: Diagnosis not present

## 2016-11-17 DIAGNOSIS — J301 Allergic rhinitis due to pollen: Secondary | ICD-10-CM | POA: Diagnosis not present

## 2016-11-23 ENCOUNTER — Other Ambulatory Visit: Payer: Self-pay | Admitting: Anesthesiology

## 2016-11-23 DIAGNOSIS — Z1211 Encounter for screening for malignant neoplasm of colon: Secondary | ICD-10-CM

## 2016-11-29 ENCOUNTER — Ambulatory Visit (INDEPENDENT_AMBULATORY_CARE_PROVIDER_SITE_OTHER): Payer: PRIVATE HEALTH INSURANCE

## 2016-11-29 DIAGNOSIS — J309 Allergic rhinitis, unspecified: Secondary | ICD-10-CM

## 2016-12-27 ENCOUNTER — Ambulatory Visit (INDEPENDENT_AMBULATORY_CARE_PROVIDER_SITE_OTHER): Payer: PRIVATE HEALTH INSURANCE

## 2016-12-27 DIAGNOSIS — J309 Allergic rhinitis, unspecified: Secondary | ICD-10-CM | POA: Diagnosis not present

## 2016-12-29 ENCOUNTER — Other Ambulatory Visit: Payer: Self-pay | Admitting: Medical

## 2016-12-29 ENCOUNTER — Other Ambulatory Visit: Payer: Self-pay | Admitting: Family

## 2017-01-02 ENCOUNTER — Other Ambulatory Visit: Payer: Self-pay | Admitting: Family

## 2017-01-07 NOTE — Addendum Note (Signed)
Addended by: Felipa Emory on: 01/07/2017 10:19 AM   Modules accepted: Orders

## 2017-02-02 ENCOUNTER — Ambulatory Visit (INDEPENDENT_AMBULATORY_CARE_PROVIDER_SITE_OTHER): Payer: PRIVATE HEALTH INSURANCE

## 2017-02-02 DIAGNOSIS — J309 Allergic rhinitis, unspecified: Secondary | ICD-10-CM

## 2017-02-06 ENCOUNTER — Telehealth: Payer: Self-pay | Admitting: Family

## 2017-02-06 NOTE — Telephone Encounter (Signed)
Trazodone refill sen to pharmacy. Pt last seen by Melissa on 10/10/16 and advised to schedule cpe. Please call pt to schedule appt. Thanks!

## 2017-02-08 NOTE — Telephone Encounter (Signed)
Pt has been scheduled. Pt has been receiving her CPE with her GYN. Advised that insurance only covers 1 CPE a calendar year. Pt scheduled a medication F/U with PCP. Pt's last CPE was in Sept. She will have her CPE with PCP then.

## 2017-02-20 ENCOUNTER — Ambulatory Visit (INDEPENDENT_AMBULATORY_CARE_PROVIDER_SITE_OTHER): Payer: 59 | Admitting: Family

## 2017-02-20 ENCOUNTER — Encounter: Payer: Self-pay | Admitting: Family

## 2017-02-20 DIAGNOSIS — F419 Anxiety disorder, unspecified: Secondary | ICD-10-CM | POA: Diagnosis not present

## 2017-02-20 DIAGNOSIS — K219 Gastro-esophageal reflux disease without esophagitis: Secondary | ICD-10-CM | POA: Diagnosis not present

## 2017-02-20 DIAGNOSIS — J45909 Unspecified asthma, uncomplicated: Secondary | ICD-10-CM | POA: Diagnosis not present

## 2017-02-20 DIAGNOSIS — L4050 Arthropathic psoriasis, unspecified: Secondary | ICD-10-CM | POA: Insufficient documentation

## 2017-02-20 MED ORDER — MONTELUKAST SODIUM 10 MG PO TABS: 10.0000 mg | ORAL_TABLET | Freq: Every day | ORAL | 1 refills | Status: DC

## 2017-02-20 MED ORDER — TRAZODONE HCL 100 MG PO TABS: 100.0000 mg | ORAL_TABLET | Freq: Every day | ORAL | 0 refills | Status: DC

## 2017-02-20 NOTE — Progress Notes (Signed)
Pre visit review using our clinic review tool, if applicable. No additional management support is needed unless otherwise documented below in the visit note. 

## 2017-02-20 NOTE — Assessment & Plan Note (Signed)
Stable on ppi, continue same.  

## 2017-02-20 NOTE — Assessment & Plan Note (Addendum)
New diagnosis for pt. She is on methotrexate and is being followed by rheumatology (med rec also shows that they are prescribing her prednisone. She feels that her symptoms are starting to show improvement.

## 2017-02-20 NOTE — Assessment & Plan Note (Signed)
Stable.  Trazodone HS for sleep.

## 2017-02-20 NOTE — Assessment & Plan Note (Signed)
Stable, continue singulair, asmanex as needed.

## 2017-02-20 NOTE — Progress Notes (Signed)
Subjective:    Patient ID: Melissa Middleton, female    DOB: 1956-08-27, 61 y.o.   MRN: [id number]  HPI  Melissa Middleton is a 61 yr old female who presents today for follow up.  1) Anxiety- just got laid off.  She is maintained on trazodone. Generally falls asleep OK.   2) GERD- maintained on nexium.  Reports that this is well controlled.  Has recurrent sxs if she skips the nexium.   3) Asthma- reports that asthma is well controlled. Using asmanex prn.  Has not needed recently.   4) psoriatic arthritis- she is on methotrexate x 4 weeks and notes that her hand swelling and stiffness is improved.  Notes that stairs are still hard to deal.    Review of Systems See HPI  Past Medical History:  Diagnosis Date  . Allergic rhinitis   . Anxiety   . Benign neoplasm of stomach    gastric polyps  . Bronchitis    recurrent  . Diaphragmatic hernia without mention of obstruction or gangrene   . Diverticulosis of colon (without mention of hemorrhage)   . Esophageal reflux    see GI  . Insomnia   . Osteoarthritis    sees ortho-has had steroid shot in knee  . Post menopausal problems    symptoms  . Premenstrual tension syndromes    gyn uses alprazolam and spironolactone prn ofr treatment  . Psoriasis   . Sinusitis    recurrent  . Stricture and stenosis of esophagus   . Unspecified asthma(493.90)   . Vitamin D deficiency      Social History   Social History  . Marital status: Married    Spouse name: N/A  . Number of children: 2  . Years of education: N/A   Occupational History  .  Unemployed    Administrative work   Social History Main Topics  . Smoking status: Never Smoker  . Smokeless tobacco: Never Used  . Alcohol use No  . Drug use: No  . Sexual activity: Yes     Comment: INTERCOURSE AGE 22, SEXUAL PARTNERS LESS THAN 5   Other Topics Concern  . Not on file   Social History Narrative  . No narrative on file    Past Surgical History:  Procedure Laterality Date    . CESAREAN SECTION    . ENDOMETRIAL ABLATION    . NASAL SINUS SURGERY    . TOTAL KNEE ARTHROPLASTY  11/2009   Right knee  . TUBAL LIGATION  2004   with ablation    Family History  Problem Relation Age of Onset  . Arthritis Other   . Hypertension Mother   . Diabetes Father   . Hypertension Father   . Cancer Sister     MELANOMA  . Hypertension Maternal Grandfather   . Diabetes Paternal Grandmother   . Migraines Neg Hx     Allergies  Allergen Reactions  . Macrodantin [Nitrofurantoin Macrocrystal] Itching and Rash  . Cefuroxime Axetil Rash and Other (See Comments)    REACTION: red face/rash  . Penicillins Rash    Current Outpatient Prescriptions on File Prior to Visit  Medication Sig Dispense Refill  . albuterol (PROVENTIL HFA;VENTOLIN HFA) 108 (90 Base) MCG/ACT inhaler Inhale 2 puffs into the lungs every 4 (four) hours as needed for wheezing or shortness of breath. 8 g 1  . ALPRAZolam (XANAX) 0.25 MG tablet Take 1 tablet (0.25 mg total) by mouth at bedtime as needed for anxiety. 90 tablet  1  . ammonium lactate (AMLACTIN) 12 % cream APPLY TO AREA TWICE A DAY 140 g 1  . ASMANEX 60 METERED DOSES 220 MCG/INH inhaler INHALE 2 PUFFS INTO THE LUNGS DAILY. (Patient taking differently: INHALE 2 PUFFS INTO THE LUNGS DAILY, AS NEEDED) 3 Inhaler 0  . EPINEPHrine (EPIPEN 2-PAK) 0.3 mg/0.3 mL IJ SOAJ injection Inject 0.3 mLs (0.3 mg total) into the muscle once. 2 Device 1  . esomeprazole (NEXIUM) 40 MG capsule TAKE 1 CAPSULE BY MOUTH DAILY BEFORE BREAKFAST. 90 capsule 1  . Estradiol 10 MCG TABS vaginal tablet Place 1 tablet (10 mcg total) vaginally 2 (two) times a week. 12 tablet 4  . fluticasone (FLONASE) 50 MCG/ACT nasal spray TWO SPRAYS EACH NOSTRIL ONCE A DAY FOR NASAL CONGESTION OR DRAINAGE. 16 g 5  . montelukast (SINGULAIR) 10 MG tablet Take 1 tablet (10 mg total) by mouth at bedtime. 30 tablet 5  . MYRBETRIQ 25 MG TB24 tablet Take 25 mg by mouth daily.  3  . spironolactone  (ALDACTONE) 25 MG tablet Take 1 tablet (25 mg total) by mouth daily as needed. 90 tablet 4  . traZODone (DESYREL) 100 MG tablet TAKE 1 TABLET AT BEDTIME 90 tablet 0  . triamcinolone cream (KENALOG) 0.1 % Apply 1 application topically 2 (two) times daily. 15 g 0   No current facility-administered medications on file prior to visit.     BP 122/84 (BP Location: Left Arm, Patient Position: Sitting, Cuff Size: Normal)   Pulse 81   Temp 98.2 F (36.8 C) (Oral)   Ht 5\' 6"  (1.676 m)   Wt 187 lb (84.8 kg)   BMI 30.18 kg/m       Objective:   Physical Exam  Constitutional: She is oriented to person, place, and time. She appears well-developed and well-nourished.  Cardiovascular: Normal rate, regular rhythm and normal heart sounds.   No murmur heard. Pulmonary/Chest: Effort normal and breath sounds normal. No respiratory distress. She has no wheezes.  Musculoskeletal: She exhibits no edema.  Neurological: She is alert and oriented to person, place, and time.  Skin:  Psoriatic plaque noted on left knee and lower back  Psychiatric: She has a normal mood and affect. Her behavior is normal. Judgment and thought content normal.          Assessment & Plan:

## 2017-02-23 ENCOUNTER — Other Ambulatory Visit: Payer: Self-pay | Admitting: Gynecology

## 2017-02-23 NOTE — Telephone Encounter (Signed)
Rx called [facility]

## 2017-02-28 ENCOUNTER — Ambulatory Visit (INDEPENDENT_AMBULATORY_CARE_PROVIDER_SITE_OTHER): Payer: PRIVATE HEALTH INSURANCE

## 2017-02-28 DIAGNOSIS — J309 Allergic rhinitis, unspecified: Secondary | ICD-10-CM

## 2017-03-09 ENCOUNTER — Other Ambulatory Visit: Payer: Self-pay | Admitting: Gynecology

## 2017-04-03 ENCOUNTER — Ambulatory Visit (INDEPENDENT_AMBULATORY_CARE_PROVIDER_SITE_OTHER): Payer: PRIVATE HEALTH INSURANCE

## 2017-04-03 DIAGNOSIS — J309 Allergic rhinitis, unspecified: Secondary | ICD-10-CM | POA: Diagnosis not present

## 2017-04-13 ENCOUNTER — Ambulatory Visit (INDEPENDENT_AMBULATORY_CARE_PROVIDER_SITE_OTHER): Payer: PRIVATE HEALTH INSURANCE | Admitting: *Deleted

## 2017-04-13 DIAGNOSIS — J309 Allergic rhinitis, unspecified: Secondary | ICD-10-CM | POA: Diagnosis not present

## 2017-04-17 ENCOUNTER — Telehealth: Payer: Self-pay | Admitting: Family

## 2017-04-17 DIAGNOSIS — L4050 Arthropathic psoriasis, unspecified: Secondary | ICD-10-CM

## 2017-04-17 NOTE — Telephone Encounter (Signed)
Caller name: Keymoni Mccaster Relationship to patient: self Can be reached: [phone]  Reason for call: Pt is requesting referral to Dr. Amil Amen at [facility]. She is stating that she does not like Dr. Earnest Conroy at [place]. She has called [facility] and they told her referral from PCP is required and records from previous Rheumatologist. She does not want records sent from Dr. Earnest Conroy because she said that she has another visit with her and can't get in to Dr. Amil Amen until June/July. She doesn't want her to know anything about the transfer.

## 2017-04-18 ENCOUNTER — Ambulatory Visit (INDEPENDENT_AMBULATORY_CARE_PROVIDER_SITE_OTHER): Payer: PRIVATE HEALTH INSURANCE

## 2017-04-18 DIAGNOSIS — J309 Allergic rhinitis, unspecified: Secondary | ICD-10-CM

## 2017-04-25 ENCOUNTER — Ambulatory Visit (INDEPENDENT_AMBULATORY_CARE_PROVIDER_SITE_OTHER): Payer: PRIVATE HEALTH INSURANCE | Admitting: Pediatrics

## 2017-04-25 ENCOUNTER — Encounter: Payer: Self-pay | Admitting: Pediatrics

## 2017-04-25 VITALS — BP 118/66 | HR 75 | Temp 97.9°F | Resp 16

## 2017-04-25 DIAGNOSIS — J4530 Mild persistent asthma, uncomplicated: Secondary | ICD-10-CM | POA: Diagnosis not present

## 2017-04-25 DIAGNOSIS — J301 Allergic rhinitis due to pollen: Secondary | ICD-10-CM | POA: Diagnosis not present

## 2017-04-25 DIAGNOSIS — K219 Gastro-esophageal reflux disease without esophagitis: Secondary | ICD-10-CM

## 2017-04-25 DIAGNOSIS — L4050 Arthropathic psoriasis, unspecified: Secondary | ICD-10-CM | POA: Diagnosis not present

## 2017-04-25 MED ORDER — EPINEPHRINE 0.3 MG/0.3ML IJ SOAJ: INTRAMUSCULAR | 2 refills | Status: DC

## 2017-04-25 NOTE — Patient Instructions (Addendum)
Zyrtec 10 mg once a day if needed for runny nose Fluticasone 2 sprays per nostril once a day if needed for stuffy nose Proventil 2 puffs every 4 hours if needed for wheezing or coughing spells Montelukast  10 mg once a day for coughing or wheezing Follow-up to do skin testing to foods.  Stop Zyrtec 3 days before his skin testing to foods  Avoid peanuts and shellfish until we can do skin testing. If you have an allergic reaction take Benadryl 50 mg every 4 hours and if you have  life-threatening symptoms inject  with EpiPen 0.3 mg

## 2017-04-25 NOTE — Progress Notes (Signed)
  [place] [address] Dept: [phone]  FOLLOW UP NOTE  Patient ID: Melissa Middleton, female    DOB: 08/03/1956  Age: 61 y.o. MRN: [id number] Date of Office Visit: 04/25/2017  Assessment  Chief Complaint: Allergic Reaction (red itchy face with cashews, crab, shrimp)  HPI Melissa Middleton presents for evaluation of an itchy red face after eating peanuts, crab and cashews. With crab, she also has itchy eyes. She has done well with her allergy injections. She gets her allergy injections every 4 weeks She is on grass and tree pollen in one vial and dust mite in  the other vial. She has done without Asmanex for several months and has done well. She has psoriatic arthritis and is on methotrexate. She also received the first dose of Humira last week  Current asthma medications rhinitis medications will be outlined in the after visit summary. Her other medications are outlined in the chart   Drug Allergies:  Allergies  Allergen Reactions  . Macrodantin [Nitrofurantoin Macrocrystal] Itching and Rash  . Cefuroxime Axetil Rash and Other (See Comments)    REACTION: red face/rash  . Penicillins Rash    Physical Exam: BP 118/66   Pulse 75   Temp 97.9 F (36.6 C) (Oral)   Resp 16   SpO2 97%    Physical Exam  Constitutional: She is oriented to person, place, and time. She appears well-developed and well-nourished.  HENT:  Eyes normal. Ears normal. Nose normal. Pharynx normal.  Neck: Neck supple.  Cardiovascular:  S1 and S2 normal no murmurs  Pulmonary/Chest:  Clear to percussion and auscultation  Lymphadenopathy:    She has no cervical adenopathy.  Neurological: She is alert and oriented to person, place, and time.  Skin:  Clear  Psychiatric: She has a normal mood and affect. Her behavior is normal. Judgment and thought content normal.  Vitals reviewed.   Diagnostics:  FVC 2.76 L FEV1 1.97 L. Predicted FVC 3.54 L predicted FEV1 2.74 L-this shows a mild  reduction in the forced vital capacity  Assessment and Plan: 1. Mild persistent asthma without complication   2. Seasonal allergic rhinitis due to pollen   3. Psoriatic arthritis ([place])   4. Gastroesophageal reflux disease without esophagitis     Meds ordered this encounter  Medications  . EPINEPHrine (EPIPEN 2-PAK) 0.3 mg/0.3 mL IJ SOAJ injection    Sig: Use as directed for severe allergic reactions    Dispense:  2 Device    Refill:  2    Patient Instructions  Zyrtec 10 mg once a day if needed for runny nose Fluticasone 2 sprays per nostril once a day if needed for stuffy nose Proventil 2 puffs every 4 hours if needed for wheezing or coughing spells Montelukast  10 mg once a day for coughing or wheezing Follow-up to do skin testing to foods.  Stop Zyrtec 3 days before his skin testing to foods  Avoid peanuts and shellfish until we can do skin testing. If you have an allergic reaction take Benadryl 50 mg every 4 hours and if you have  life-threatening symptoms inject  with EpiPen 0.3 mg   Return in about 2 weeks (around 05/09/2017).    Thank you for the opportunity to care for this patient.  Please do not hesitate to contact me with questions.  Penne Lash, M.D.  [facility] [address] [place], [place] [address] [phone]

## 2017-04-26 ENCOUNTER — Ambulatory Visit (INDEPENDENT_AMBULATORY_CARE_PROVIDER_SITE_OTHER): Payer: PRIVATE HEALTH INSURANCE | Admitting: *Deleted

## 2017-04-26 DIAGNOSIS — J309 Allergic rhinitis, unspecified: Secondary | ICD-10-CM

## 2017-05-03 ENCOUNTER — Encounter: Payer: Self-pay | Admitting: Gynecology

## 2017-05-04 ENCOUNTER — Ambulatory Visit (INDEPENDENT_AMBULATORY_CARE_PROVIDER_SITE_OTHER): Payer: PRIVATE HEALTH INSURANCE

## 2017-05-04 DIAGNOSIS — J309 Allergic rhinitis, unspecified: Secondary | ICD-10-CM | POA: Diagnosis not present

## 2017-05-08 ENCOUNTER — Ambulatory Visit (INDEPENDENT_AMBULATORY_CARE_PROVIDER_SITE_OTHER): Payer: PRIVATE HEALTH INSURANCE | Admitting: Pediatrics

## 2017-05-08 ENCOUNTER — Encounter: Payer: Self-pay | Admitting: Pediatrics

## 2017-05-08 VITALS — BP 122/78 | HR 70 | Temp 98.2°F | Resp 16

## 2017-05-08 DIAGNOSIS — T7800XD Anaphylactic reaction due to unspecified food, subsequent encounter: Secondary | ICD-10-CM

## 2017-05-08 DIAGNOSIS — J3089 Other allergic rhinitis: Secondary | ICD-10-CM | POA: Diagnosis not present

## 2017-05-08 DIAGNOSIS — J4530 Mild persistent asthma, uncomplicated: Secondary | ICD-10-CM

## 2017-05-08 DIAGNOSIS — L4050 Arthropathic psoriasis, unspecified: Secondary | ICD-10-CM

## 2017-05-08 DIAGNOSIS — T7800XA Anaphylactic reaction due to unspecified food, initial encounter: Secondary | ICD-10-CM | POA: Insufficient documentation

## 2017-05-08 DIAGNOSIS — K219 Gastro-esophageal reflux disease without esophagitis: Secondary | ICD-10-CM

## 2017-05-08 NOTE — Patient Instructions (Addendum)
Continue on her allergy injections every 4 weeks Zyrtec 10 mg once a day if needed for runny nose or itchy eyes Fluticasone 2 sprays per nostril once a day if needed for stuffy nose Proventil 2 puffs every 4 hours if needed for wheezing or coughing spells Montelukast 10 mg once a day for coughing or wheezing Continue on your other medications Call me if you're not doing well on this treatment plan  Avoid peanuts, cashew and shellfish. If you have an allergic reaction , take Benadryl 50 mg every 4 hours and if you have life-threatening symptoms inject with EpiPen 0.3 mg . Dust mite allergen  cross reacts with shrimp

## 2017-05-08 NOTE — Progress Notes (Signed)
  [place] [address] Dept: [phone]  FOLLOW UP NOTE  Patient ID: Melissa Middleton, female    DOB: 12-04-1956  Age: 61 y.o. MRN: [id number] Date of Office Visit: 05/08/2017  Assessment  Chief Complaint: Allergy Testing  HPI Melissa Middleton presents for for allergy testing. She has noted that if she eats peanuts, crab, or cashews she has itchy eyes and a rash on her face. She is on allergy injections to grass and tree pollens in one vial and dust mite in the other vial. She has developed psoriatic arthritis and is on methotrexate and Humira .  Current medications will be outlined in the afternoon visit summary. Her other medications are outlined in the chart   Drug Allergies:  Allergies  Allergen Reactions  . Macrodantin [Nitrofurantoin Macrocrystal] Itching and Rash  . Cefuroxime Axetil Rash and Other (See Comments)    REACTION: red face/rash  . Penicillins Rash    Physical Exam: BP 122/78   Pulse 70   Temp 98.2 F (36.8 C) (Oral)   Resp 16   SpO2 98%    Physical Exam  Constitutional: She is oriented to person, place, and time. She appears well-developed and well-nourished.  HENT:  Eyes normal. Ears normal. Nose mild swelling of nasal turbinates. Pharynx normal.  Neck: Neck supple.  Cardiovascular:  S1 and S2 normal no murmurs  Pulmonary/Chest:  Clear to percussion and auscultation  Lymphadenopathy:    She has no cervical adenopathy.  Neurological: She is alert and oriented to person, place, and time.  Skin:  Several psoriatic lesions  Psychiatric: She has a normal mood and affect. Her behavior is normal. Judgment and thought content normal.  Vitals reviewed.   Diagnostics:  Allergy skin tests were positive to dust mite and grass pollen but much less than in the past in the case of grass pollens. She had mild reactivity to oak , cockroach and some molds. Skin testing to peanut was positive  Assessment and Plan: 1. Other allergic rhinitis    2. Anaphylactic shock due to food, subsequent encounter   3. Psoriatic arthritis ([place])   4. Gastroesophageal reflux disease without esophagitis   5. Mild persistent asthma without complication        Patient Instructions  Continue on her allergy injections every 4 weeks Zyrtec 10 mg once a day if needed for runny nose or itchy eyes Fluticasone 2 sprays per nostril once a day if needed for stuffy nose Proventil 2 puffs every 4 hours if needed for wheezing or coughing spells Montelukast 10 mg once a day for coughing or wheezing Continue on your other medications Call me if you're not doing well on this treatment plan  Avoid peanuts, cashew and shellfish. If you have an allergic reaction , take Benadryl 50 mg every 4 hours and if you have life-threatening symptoms inject with EpiPen 0.3 mg . Dust mite allergen  cross reacts with shrimp   Return in about 3 months (around 08/08/2017).    Thank you for the opportunity to care for this patient.  Please do not hesitate to contact me with questions.  Penne Lash, M.D.  [facility] [address] [place], [place] [address] [phone]

## 2017-05-30 ENCOUNTER — Ambulatory Visit (INDEPENDENT_AMBULATORY_CARE_PROVIDER_SITE_OTHER): Payer: PRIVATE HEALTH INSURANCE

## 2017-05-30 DIAGNOSIS — J309 Allergic rhinitis, unspecified: Secondary | ICD-10-CM

## 2017-06-23 DIAGNOSIS — J3089 Other allergic rhinitis: Secondary | ICD-10-CM | POA: Diagnosis not present

## 2017-06-26 ENCOUNTER — Other Ambulatory Visit: Payer: Self-pay | Admitting: *Deleted

## 2017-06-26 MED ORDER — ESOMEPRAZOLE MAGNESIUM 40 MG PO CPDR: DELAYED_RELEASE_CAPSULE | ORAL | 1 refills | Status: DC

## 2017-06-26 NOTE — Progress Notes (Signed)
[facility]ed [facility] from [facility] Esomep[facility]ole Mag DR 40 mg for  Last filled by MD on 12/30/16, #90x1 Last AEX - 02/20/17 Refill sent per [facility] refill protocol/SLS

## 2017-06-29 ENCOUNTER — Ambulatory Visit (INDEPENDENT_AMBULATORY_CARE_PROVIDER_SITE_OTHER): Payer: PRIVATE HEALTH INSURANCE

## 2017-06-29 DIAGNOSIS — J309 Allergic rhinitis, unspecified: Secondary | ICD-10-CM

## 2017-08-01 ENCOUNTER — Ambulatory Visit (INDEPENDENT_AMBULATORY_CARE_PROVIDER_SITE_OTHER): Payer: PRIVATE HEALTH INSURANCE

## 2017-08-01 DIAGNOSIS — J309 Allergic rhinitis, unspecified: Secondary | ICD-10-CM | POA: Diagnosis not present

## 2017-08-08 ENCOUNTER — Ambulatory Visit: Payer: PRIVATE HEALTH INSURANCE | Admitting: Pediatrics

## 2017-08-25 ENCOUNTER — Encounter: Payer: Self-pay | Admitting: Family

## 2017-08-25 ENCOUNTER — Ambulatory Visit (INDEPENDENT_AMBULATORY_CARE_PROVIDER_SITE_OTHER): Payer: 59 | Admitting: Family

## 2017-08-25 VITALS — BP 134/74 | HR 81 | Temp 98.1°F | Ht 65.6 in | Wt 185.0 lb

## 2017-08-25 DIAGNOSIS — E559 Vitamin D deficiency, unspecified: Secondary | ICD-10-CM

## 2017-08-25 DIAGNOSIS — R5383 Other fatigue: Secondary | ICD-10-CM | POA: Diagnosis not present

## 2017-08-25 DIAGNOSIS — L4050 Arthropathic psoriasis, unspecified: Secondary | ICD-10-CM | POA: Diagnosis not present

## 2017-08-25 DIAGNOSIS — Z0000 Encounter for general adult medical examination without abnormal findings: Secondary | ICD-10-CM

## 2017-08-25 DIAGNOSIS — M542 Cervicalgia: Secondary | ICD-10-CM

## 2017-08-25 DIAGNOSIS — Z23 Encounter for immunization: Secondary | ICD-10-CM | POA: Diagnosis not present

## 2017-08-25 LAB — CBC WITH DIFFERENTIAL/PLATELET
Basophils Absolute: 0 10*3/uL (ref 0.0–0.1)
Basophils Relative: 0.4 % (ref 0.0–3.0)
Eosinophils Absolute: 0.3 10*3/uL (ref 0.0–0.7)
Eosinophils Relative: 6.1 % — ABNORMAL HIGH (ref 0.0–5.0)
HCT: 39.2 % (ref 36.0–46.0)
Hemoglobin: 12.9 g/dL (ref 12.0–15.0)
Lymphocytes Relative: 33.8 % (ref 12.0–46.0)
Lymphs Abs: 1.8 10*3/uL (ref 0.7–4.0)
MCHC: 32.8 g/dL (ref 30.0–36.0)
MCV: 87.7 fl (ref 78.0–100.0)
Monocytes Absolute: 0.6 10*3/uL (ref 0.1–1.0)
Monocytes Relative: 11.5 % (ref 3.0–12.0)
Neutro Abs: 2.6 10*3/uL (ref 1.4–7.7)
Neutrophils Relative %: 48.2 % (ref 43.0–77.0)
Platelets: 234 10*3/uL (ref 150.0–400.0)
RBC: 4.47 Mil/uL (ref 3.87–5.11)
RDW: 13.5 % (ref 11.5–15.5)
WBC: 5.3 10*3/uL (ref 4.0–10.5)

## 2017-08-25 LAB — LIPID PANEL
Cholesterol: 208 mg/dL — ABNORMAL HIGH (ref 0–200)
HDL: 55.2 mg/dL (ref >39.00)
LDL Cholesterol: 126 mg/dL — ABNORMAL HIGH (ref 0–99)
NonHDL: 152.84
Total CHOL/HDL Ratio: 4
Triglycerides: 136 mg/dL (ref 0.0–149.0)
VLDL: 27.2 mg/dL (ref 0.0–40.0)

## 2017-08-25 LAB — URINALYSIS, ROUTINE W REFLEX MICROSCOPIC
Bilirubin Urine: NEGATIVE
Hgb urine dipstick: NEGATIVE
Ketones, ur: NEGATIVE
Leukocytes, UA: NEGATIVE
Nitrite: NEGATIVE
Specific Gravity, Urine: 1.02 (ref 1.000–1.030)
Total Protein, Urine: NEGATIVE
Urine Glucose: NEGATIVE
Urobilinogen, UA: 0.2 (ref 0.0–1.0)
pH: 6.5 (ref 5.0–8.0)

## 2017-08-25 LAB — BASIC METABOLIC PANEL
BUN: 17 mg/dL (ref 6–23)
CO2: 28 mEq/L (ref 19–32)
Calcium: 10 mg/dL (ref 8.4–10.5)
Chloride: 102 mEq/L (ref 96–112)
Creatinine, Ser: 0.76 mg/dL (ref 0.40–1.20)
GFR: 82.11 mL/min (ref >60.00)
Glucose, Bld: 94 mg/dL (ref 70–99)
Potassium: 4.9 mEq/L (ref 3.5–5.1)
Sodium: 138 mEq/L (ref 135–145)

## 2017-08-25 LAB — HEPATIC FUNCTION PANEL
ALT: 21 U/L (ref 0–35)
AST: 23 U/L (ref 0–37)
Albumin: 4.2 g/dL (ref 3.5–5.2)
Alkaline Phosphatase: 57 U/L (ref 39–117)
Bilirubin, Direct: 0.1 mg/dL (ref 0.0–0.3)
Total Bilirubin: 0.4 mg/dL (ref 0.2–1.2)
Total Protein: 7 g/dL (ref 6.0–8.3)

## 2017-08-25 LAB — TSH: TSH: 1.58 u[IU]/mL (ref 0.35–4.50)

## 2017-08-25 NOTE — Progress Notes (Signed)
Subjective:    Patient ID: Melissa Middleton, female    DOB: [birth year], 61 y.o.   MRN: [id number]  HPI  Patient presents today for complete physical.  Immunizations:Tetanus up to date Colonoscopy: 12/19/07 Dexa: 10/17/16 - normal bone density.   Pap Smear:  8/16, negative, sees GYN Mammogram: 08/16/16- she will follow up with GYN for  mammog  Fatigue-   Wt Readings from Last 3 Encounters:  08/25/17 185 lb (83.9 kg)  02/20/17 187 lb (84.8 kg)  10/10/16 190 lb 12.8 oz (86.5 kg)    Eline- in [place] with [employer]. Neck pain and worried about curvature.  She is seeing a Restaurant manager, fast food.    Review of Systems  Constitutional: Negative for unexpected weight change.  HENT: Negative for hearing loss and rhinorrhea.   Eyes: Negative for visual disturbance.  Respiratory: Negative for cough and shortness of breath.   Cardiovascular: Negative for chest pain.  Gastrointestinal: Negative for constipation and diarrhea.  Genitourinary: Negative for dysuria and frequency.  Musculoskeletal: Positive for arthralgias.  Skin:       Some psoriasis- but this is improved  Neurological: Negative for headaches.  Hematological: Negative for adenopathy.  Psychiatric/Behavioral:       Denies depression/anxiety   Past Medical History:  Diagnosis Date  . Allergic rhinitis   . Anxiety   . Benign neoplasm of stomach    gastric polyps  . Bronchitis    recurrent  . Diaphragmatic hernia without mention of obstruction or gangrene   . Diverticulosis of colon (without mention of hemorrhage)   . Esophageal reflux    see GI  . Insomnia   . Osteoarthritis    sees ortho-has had steroid shot in knee  . Post menopausal problems    symptoms  . Premenstrual tension syndromes    gyn uses alprazolam and spironolactone prn ofr treatment  . Psoriasis   . Sinusitis    recurrent  . Stricture and stenosis of esophagus   . Unspecified asthma(493.90)   . Vitamin D deficiency      Social History   Social  History  . Marital status: Married    Spouse name: N/A  . Number of children: 2  . Years of education: N/A   Occupational History  .  Unemployed    Administrative work   Social History Main Topics  . Smoking status: Never Smoker  . Smokeless tobacco: Never Used  . Alcohol use No  . Drug use: No  . Sexual activity: Yes     Comment: INTERCOURSE AGE 69, SEXUAL PARTNERS LESS THAN 5   Other Topics Concern  . Not on file   Social History Narrative  . No narrative on file    Past Surgical History:  Procedure Laterality Date  . CESAREAN SECTION    . ENDOMETRIAL ABLATION    . NASAL SINUS SURGERY    . TOTAL KNEE ARTHROPLASTY  11/2009   Right knee  . TUBAL LIGATION  2004   with ablation    Family History  Problem Relation Age of Onset  . Arthritis Other   . Hypertension Mother        died from an asthma attack  . Diabetes Father        died from sepsis  . Hypertension Father   . Cancer Sister        MELANOMA  . Ovarian cancer Sister   . Hypertension Maternal Grandfather   . Diabetes Paternal Grandmother   . Migraines Neg Hx  Allergies  Allergen Reactions  . Macrodantin [Nitrofurantoin Macrocrystal] Itching and Rash  . Cefuroxime Axetil Rash and Other (See Comments)    REACTION: red face/rash  . Penicillins Rash    Current Outpatient Prescriptions on File Prior to Visit  Medication Sig Dispense Refill  . Adalimumab 40 MG/0.8ML PNKT Inject 40 mg into the skin.    Marland Kitchen albuterol (PROVENTIL HFA;VENTOLIN HFA) 108 (90 Base) MCG/ACT inhaler Inhale 2 puffs into the lungs every 4 (four) hours as needed for wheezing or shortness of breath. 8 g 1  . ALPRAZolam (XANAX) 0.25 MG tablet TAKE 1 TABLET AT BEDTIME AS NEEDED FOR ANXIETY 90 tablet 3  . ammonium lactate (AMLACTIN) 12 % cream APPLY TO AREA TWICE A DAY 140 g 1  . ASMANEX 60 METERED DOSES 220 MCG/INH inhaler INHALE 2 PUFFS INTO THE LUNGS DAILY. 3 Inhaler 0  . EPINEPHrine (EPIPEN 2-PAK) 0.3 mg/0.3 mL IJ SOAJ injection  Use as directed for severe allergic reactions 2 Device 2  . esomeprazole (NEXIUM) 40 MG capsule TAKE 1 CAPSULE BY MOUTH DAILY BEFORE BREAKFAST. 90 capsule 1  . Estradiol 10 MCG TABS vaginal tablet Place 1 tablet (10 mcg total) vaginally 2 (two) times a week. 12 tablet 4  . fluticasone (FLONASE) 50 MCG/ACT nasal spray TWO SPRAYS EACH NOSTRIL ONCE A DAY FOR NASAL CONGESTION OR DRAINAGE. 16 g 5  . folic acid (FOLVITE) 1 MG tablet Take 1 mg by mouth daily.    . montelukast (SINGULAIR) 10 MG tablet Take 1 tablet (10 mg total) by mouth at bedtime. 90 tablet 1  . spironolactone (ALDACTONE) 25 MG tablet Take 1 tablet (25 mg total) by mouth daily as needed. 90 tablet 4  . traZODone (DESYREL) 100 MG tablet Take 1 tablet (100 mg total) by mouth at bedtime. 90 tablet 0  . triamcinolone cream (KENALOG) 0.1 % Apply 1 application topically 2 (two) times daily. 15 g 0   No current facility-administered medications on file prior to visit.     BP 134/74   Pulse 81   Temp 98.1 F (36.7 C) (Oral)   Ht 5' 5.6" (1.666 m)   Wt 185 lb (83.9 kg)   SpO2 98%   BMI 30.23 kg/m       Objective:   Physical Exam  Physical Exam  Constitutional: She is oriented to person, place, and time. She appears well-developed and well-nourished. No distress.  HENT:  Head: Normocephalic and atraumatic.  Right Ear: Tympanic membrane and ear canal normal.  Left Ear: Tympanic membrane and ear canal normal.  Mouth/Throat: Oropharynx is clear and moist.  Eyes: Pupils are equal, round, and reactive to light. No scleral icterus.  Neck: Normal range of motion. No thyromegaly present.  mild kyphosis is noted. Cardiovascular: Normal rate and regular rhythm.   No murmur heard. Pulmonary/Chest: Effort normal and breath sounds normal. No respiratory distress. He has no wheezes. She has no rales. She exhibits no tenderness.  Abdominal: Soft. Bowel sounds are normal. She exhibits no distension and no mass. There is no tenderness. There  is no rebound and no guarding.  Musculoskeletal: She exhibits no edema.  Lymphadenopathy:    She has no cervical adenopathy.  Neurological: She is alert and oriented to person, place, and time. She has normal patellar reflexes. She exhibits normal muscle tone. Coordination normal.  Skin: Skin is warm and dry.  Psychiatric: She has a normal mood and affect. Her behavior is normal. Judgment and thought content normal.  Breast/pelvic: deferred  Assessment & Plan:    Preventative care- suggested Shingrix (can check with pharmacy as we are out of stock).  Tetanus is up-to-date. She is due for colonoscopy and I placed referral. She is also due for mammogram however she prefers to do this with her gynecologist and will schedule follow-up. Obtain routine lab work. Flu shot today.  Vitamin D deficiency-will check follow-up vitamin D level.  Fatigue-could be related to her psoriatic arthritis. I will check a blood count and TSH as part of her physical. I told her that insurance is unlikely to pay for a vitamin B12 and vitamin C level due to fatigue. She wishes to hold off on this testing at this time.  Neck pain-neck pain is mild. She is more concerned about her kyphosis. I advised her that she has a normal bone density. 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 She is requesting referral to orthopedics. Referral has been placed.   Psoriatic arthritis-currently stable on current treatment. Management per rheumatology.     Assessment & Plan:

## 2017-08-25 NOTE — Patient Instructions (Addendum)
Please see if your pharmacy has Shingrix vaccine. This is a 2 dose series. Complete lab work prior to leaving. You should be contacted about your referral to ortho (neck) and GI (colonoscopy).

## 2017-08-29 ENCOUNTER — Ambulatory Visit (INDEPENDENT_AMBULATORY_CARE_PROVIDER_SITE_OTHER): Payer: PRIVATE HEALTH INSURANCE | Admitting: *Deleted

## 2017-08-29 DIAGNOSIS — J309 Allergic rhinitis, unspecified: Secondary | ICD-10-CM | POA: Diagnosis not present

## 2017-08-30 ENCOUNTER — Telehealth: Payer: Self-pay | Admitting: *Deleted

## 2017-08-30 LAB — VITAMIN D 1,25 DIHYDROXY
Vitamin D 1, 25 (OH)2 Total: 48 pg/mL (ref 18–72)
Vitamin D2 1, 25 (OH)2: <8 pg/mL
Vitamin D3 1, 25 (OH)2: 48 pg/mL

## 2017-08-30 MED ORDER — TRAZODONE HCL 100 MG PO TABS
100.0000 mg | ORAL_TABLET | Freq: Every day | ORAL | 0 refills | Status: DC
Start: 2017-08-30 — End: 2017-11-25

## 2017-08-30 NOTE — Telephone Encounter (Signed)
[facility] trazodone 100mg , #90. Refill sent.

## 2017-09-07 ENCOUNTER — Other Ambulatory Visit: Payer: Self-pay

## 2017-09-07 NOTE — Telephone Encounter (Signed)
We can discuss this at the annual exam which is due now.

## 2017-09-07 NOTE — Telephone Encounter (Signed)
Refills denied with pharmacy with note to have patient call the office. Needs to schedule CE to discuss.

## 2017-09-07 NOTE — Telephone Encounter (Signed)
Dr. Durenda Guthrie patient. Requesting refills on Aldactone and Xanax.  Dr. Durenda Guthrie  08/23/2016 "Requesting refill on her Xanax which she takes for anxiety on a when necessary basis."  Dr. Durenda Guthrie 06/27/2014 visit note "She does take Xanax 0.25 mg daily when necessary anxiety. She takes Aldactone 25 mg by mouth when necessary fluid retention."

## 2017-09-12 ENCOUNTER — Telehealth: Payer: Self-pay | Admitting: Family

## 2017-09-12 NOTE — Telephone Encounter (Signed)
Is she having swallowing issues? If so, will need OV with GI first and then they will decide on medical need to schedule endo with colo.

## 2017-09-12 NOTE — Telephone Encounter (Signed)
Spoke with pt. She states her pills aren't going down as easliy as they have been and she will discuss with GI at her upcoming appt.

## 2017-09-12 NOTE — Telephone Encounter (Signed)
Caller name: Ramanda  Relation to pt: self Call back number: [phone] Pharmacy:  Reason for call: Pt states has been referral to [facility] for a colonoscopy but also would like to have a referral added for an endo also, 10 years ago pt had the same process done and would like to have it done the same way at the same time being seen at the [facility], (pt states had to have her esophagus stretched). Please advise.

## 2017-09-18 ENCOUNTER — Other Ambulatory Visit: Payer: Self-pay

## 2017-09-18 ENCOUNTER — Encounter: Payer: Self-pay | Admitting: Gastroenterology

## 2017-09-18 MED ORDER — SPIRONOLACTONE 25 MG PO TABS: 25.0000 mg | ORAL_TABLET | Freq: Every day | ORAL | 0 refills | Status: DC | PRN

## 2017-09-18 NOTE — Telephone Encounter (Signed)
Former patient of Dr. Moshe Salisbury. 08/05/2015 visit Dr. Moshe Salisbury wrote " Patient for fluid retention has taking Aldactone 25 mg. daily when necessary."  Patient has CE scheduled to see you  On 10/23/17. (Past CE was 09/07/2016.)

## 2017-09-20 ENCOUNTER — Telehealth: Payer: Self-pay | Admitting: Family

## 2017-09-20 NOTE — Telephone Encounter (Signed)
Pt states she has not been notified of lab results from 08/25/17. Please call pt .

## 2017-09-20 NOTE — Telephone Encounter (Signed)
Dawn, are you able to see what codes I associated with each lab please?

## 2017-09-20 NOTE — Telephone Encounter (Signed)
The patient called in reference to a bill that she received for labs that was drawn 08/25/17. She called the billing department and they told her to call the office and have them to re-code the visit because the labs were for her physical. They also told the patient that her insurance only covers lipid panels every 5 years. She would like to have something noted in her chart so she doesn't get another lipid panel until she needs one.   Please Advise

## 2017-09-22 NOTE — Telephone Encounter (Signed)
Hey,   Looks like Z00.00 with all of labs except Vit d, which was Vit. D deficiency. Dawn

## 2017-09-25 ENCOUNTER — Other Ambulatory Visit: Payer: Self-pay | Admitting: Women's Health

## 2017-09-25 DIAGNOSIS — Z1231 Encounter for screening mammogram for malignant neoplasm of breast: Secondary | ICD-10-CM

## 2017-09-26 ENCOUNTER — Ambulatory Visit (INDEPENDENT_AMBULATORY_CARE_PROVIDER_SITE_OTHER): Payer: PRIVATE HEALTH INSURANCE

## 2017-09-26 ENCOUNTER — Ambulatory Visit (HOSPITAL_BASED_OUTPATIENT_CLINIC_OR_DEPARTMENT_OTHER)
Admission: RE | Admit: 2017-09-26 | Discharge: 2017-09-26 | Disposition: A | Discharge status: Home / Self Care | Payer: 59 | Source: Ambulatory Visit | Attending: Women's Health | Admitting: Women's Health

## 2017-09-26 DIAGNOSIS — Z1231 Encounter for screening mammogram for malignant neoplasm of breast: Secondary | ICD-10-CM | POA: Insufficient documentation

## 2017-09-26 DIAGNOSIS — J309 Allergic rhinitis, unspecified: Secondary | ICD-10-CM | POA: Diagnosis not present

## 2017-09-27 ENCOUNTER — Encounter: Payer: Self-pay | Admitting: Women's Health

## 2017-10-23 ENCOUNTER — Encounter: Payer: Self-pay | Admitting: Women's Health

## 2017-10-23 ENCOUNTER — Ambulatory Visit: Payer: PRIVATE HEALTH INSURANCE | Admitting: Women's Health

## 2017-10-23 VITALS — BP 122/76 | Ht 65.0 in | Wt 189.0 lb

## 2017-10-23 DIAGNOSIS — F419 Anxiety disorder, unspecified: Secondary | ICD-10-CM

## 2017-10-23 DIAGNOSIS — Z01419 Encounter for gynecological examination (general) (routine) without abnormal findings: Secondary | ICD-10-CM

## 2017-10-23 MED ORDER — SPIRONOLACTONE 25 MG PO TABS: 25.0000 mg | ORAL_TABLET | Freq: Every day | ORAL | 0 refills | Status: DC | PRN

## 2017-10-23 MED ORDER — ALPRAZOLAM 0.25 MG PO TABS: ORAL_TABLET | ORAL | 1 refills | Status: DC

## 2017-10-23 MED ORDER — ESTRADIOL 10 MCG VA TABS: 1.0000 | ORAL_TABLET | VAGINAL | 4 refills | Status: DC

## 2017-10-23 NOTE — Patient Instructions (Signed)
Health Maintenance for Postmenopausal Women Menopause is a normal process in which your reproductive ability comes to an end. This process happens gradually over a span of months to years, usually between the ages of 22 and 9. Menopause is complete when you have missed 12 consecutive menstrual periods. It is important to talk with your health care provider about some of the most common conditions that affect postmenopausal women, such as heart disease, cancer, and bone loss (osteoporosis). Adopting a healthy lifestyle and getting preventive care can help to promote your health and wellness. Those actions can also lower your chances of developing some of these common conditions. What should I know about menopause? During menopause, you may experience a number of symptoms, such as:  Moderate-to-severe hot flashes.  Night sweats.  Decrease in sex drive.  Mood swings.  Headaches.  Tiredness.  Irritability.  Memory problems.  Insomnia.  Choosing to treat or not to treat menopausal changes is an individual decision that you make with your health care provider. What should I know about hormone replacement therapy and supplements? Hormone therapy products are effective for treating symptoms that are associated with menopause, such as hot flashes and night sweats. Hormone replacement carries certain risks, especially as you become older. If you are thinking about using estrogen or estrogen with progestin treatments, discuss the benefits and risks with your health care provider. What should I know about heart disease and stroke? Heart disease, heart attack, and stroke become more likely as you age. This may be due, in part, to the hormonal changes that your body experiences during menopause. These can affect how your body processes dietary fats, triglycerides, and cholesterol. Heart attack and stroke are both medical emergencies. There are many things that you can do to help prevent heart disease  and stroke:  Have your blood pressure checked at least every 1-2 years. High blood pressure causes heart disease and increases the risk of stroke.  If you are 53-22 years old, ask your health care provider if you should take aspirin to prevent a heart attack or a stroke.  Do not use any tobacco products, including cigarettes, chewing tobacco, or electronic cigarettes. If you need help quitting, ask your health care provider.  It is important to eat a healthy diet and maintain a healthy weight. ? Be sure to include plenty of vegetables, fruits, low-fat dairy products, and lean protein. ? Avoid eating foods that are high in solid fats, added sugars, or salt (sodium).  Get regular exercise. This is one of the most important things that you can do for your health. ? Try to exercise for at least 150 minutes each week. The type of exercise that you do should increase your heart rate and make you sweat. This is known as moderate-intensity exercise. ? Try to do strengthening exercises at least twice each week. Do these in addition to the moderate-intensity exercise.  Know your numbers.Ask your health care provider to check your cholesterol and your blood glucose. Continue to have your blood tested as directed by your health care provider.  What should I know about cancer screening? There are several types of cancer. Take the following steps to reduce your risk and to catch any cancer development as early as possible. Breast Cancer  Practice breast self-awareness. ? This means understanding how your breasts normally appear and feel. ? It also means doing regular breast self-exams. Let your health care provider know about any changes, no matter how small.  If you are 40  or older, have a clinician do a breast exam (clinical breast exam or CBE) every year. Depending on your age, family history, and medical history, it may be recommended that you also have a yearly breast X-ray (mammogram).  If you  have a family history of breast cancer, talk with your health care provider about genetic screening.  If you are at high risk for breast cancer, talk with your health care provider about having an MRI and a mammogram every year.  Breast cancer (BRCA) gene test is recommended for women who have family members with BRCA-related cancers. Results of the assessment will determine the need for genetic counseling and BRCA1 and for BRCA2 testing. BRCA-related cancers include these types: ? Breast. This occurs in males or females. ? Ovarian. ? Tubal. This may also be called fallopian tube cancer. ? Cancer of the abdominal or pelvic lining (peritoneal cancer). ? Prostate. ? Pancreatic.  Cervical, Uterine, and Ovarian Cancer Your health care provider may recommend that you be screened regularly for cancer of the pelvic organs. These include your ovaries, uterus, and vagina. This screening involves a pelvic exam, which includes checking for microscopic changes to the surface of your cervix (Pap test).  For women ages 21-65, health care providers may recommend a pelvic exam and a Pap test every three years. For women ages 79-65, they may recommend the Pap test and pelvic exam, combined with testing for human papilloma virus (HPV), every five years. Some types of HPV increase your risk of cervical cancer. Testing for HPV may also be done on women of any age who have unclear Pap test results.  Other health care providers may not recommend any screening for nonpregnant women who are considered low risk for pelvic cancer and have no symptoms. Ask your health care provider if a screening pelvic exam is right for you.  If you have had past treatment for cervical cancer or a condition that could lead to cancer, you need Pap tests and screening for cancer for at least 20 years after your treatment. If Pap tests have been discontinued for you, your risk factors (such as having a new sexual partner) need to be  reassessed to determine if you should start having screenings again. Some women have medical problems that increase the chance of getting cervical cancer. In these cases, your health care provider may recommend that you have screening and Pap tests more often.  If you have a family history of uterine cancer or ovarian cancer, talk with your health care provider about genetic screening.  If you have vaginal bleeding after reaching menopause, tell your health care provider.  There are currently no reliable tests available to screen for ovarian cancer.  Lung Cancer Lung cancer screening is recommended for adults 69-62 years old who are at high risk for lung cancer because of a history of smoking. A yearly low-dose CT scan of the lungs is recommended if you:  Currently smoke.  Have a history of at least 30 pack-years of smoking and you currently smoke or have quit within the past 15 years. A pack-year is smoking an average of one pack of cigarettes per day for one year.  Yearly screening should:  Continue until it has been 15 years since you quit.  Stop if you develop a health problem that would prevent you from having lung cancer treatment.  Colorectal Cancer  This type of cancer can be detected and can often be prevented.  Routine colorectal cancer screening usually begins at  age 42 and continues through age 45.  If you have risk factors for colon cancer, your health care provider may recommend that you be screened at an earlier age.  If you have a family history of colorectal cancer, talk with your health care provider about genetic screening.  Your health care provider may also recommend using home test kits to check for hidden blood in your stool.  A small camera at the end of a tube can be used to examine your colon directly (sigmoidoscopy or colonoscopy). This is done to check for the earliest forms of colorectal cancer.  Direct examination of the colon should be repeated every  5-10 years until age 71. However, if early forms of precancerous polyps or small growths are found or if you have a family history or genetic risk for colorectal cancer, you may need to be screened more often.  Skin Cancer  Check your skin from head to toe regularly.  Monitor any moles. Be sure to tell your health care provider: ? About any new moles or changes in moles, especially if there is a change in a mole's shape or color. ? If you have a mole that is larger than the size of a pencil eraser.  If any of your family members has a history of skin cancer, especially at a [place] age, talk with your health care provider about genetic screening.  Always use sunscreen. Apply sunscreen liberally and repeatedly throughout the day.  Whenever you are outside, protect yourself by wearing long sleeves, pants, a wide-brimmed hat, and sunglasses.  What should I know about osteoporosis? Osteoporosis is a condition in which bone destruction happens more quickly than new bone creation. After menopause, you may be at an increased risk for osteoporosis. To help prevent osteoporosis or the bone fractures that can happen because of osteoporosis, the following is recommended:  If you are 46-71 years old, get at least 1,000 mg of calcium and at least 600 mg of vitamin D per day.  If you are older than age 55 but younger than age 65, get at least 1,200 mg of calcium and at least 600 mg of vitamin D per day.  If you are older than age 54, get at least 1,200 mg of calcium and at least 800 mg of vitamin D per day.  Smoking and excessive alcohol intake increase the risk of osteoporosis. Eat foods that are rich in calcium and vitamin D, and do weight-bearing exercises several times each week as directed by your health care provider. What should I know about how menopause affects my mental health? Depression may occur at any age, but it is more common as you become older. Common symptoms of depression  include:  Low or sad mood.  Changes in sleep patterns.  Changes in appetite or eating patterns.  Feeling an overall lack of motivation or enjoyment of activities that you previously enjoyed.  Frequent crying spells.  Talk with your health care provider if you think that you are experiencing depression. What should I know about immunizations? It is important that you get and maintain your immunizations. These include:  Tetanus, diphtheria, and pertussis (Tdap) booster vaccine.  Influenza every year before the flu season begins.  Pneumonia vaccine.  Shingles vaccine.  Your health care provider may also recommend other immunizations. This information is not intended to replace advice given to you by your health care provider. Make sure you discuss any questions you have with your health care provider. Document Released: 01/27/2006  Document Revised: 06/24/2016 Document Reviewed: 09/08/2015 Elsevier Interactive Patient Education  2018 Elsevier Inc.  

## 2017-10-23 NOTE — Progress Notes (Signed)
Melissa Middleton Apr 25, 1956 [id number]    History:    Presents for annual exam.  Postmenopausal on Vagifem  with no bleeding. Normal Pap and mammogram history. 09/2016 normal DEXA. Negative colonoscopy 2008. Sr. history of ovarian cancer, has had a negative/normal ultrasound with negative CA-125. Primary care manages labs. Requested refill of Xanax. Biggest problem this past year psoriatic arthritis currently on Humira with some relief. Has had Pneumovax.  Past medical history, past surgical history, family history and social history were all reviewed and documented in the EPIC chart. Retired, 2 children both doing well.  ROS:  A ROS was performed and pertinent positives and negatives are included.  Exam:  Vitals:   10/23/17 1204  BP: 122/76  Weight: 189 lb (85.7 kg)  Height: 5\' 5"  (1.651 m)   Body mass index is 31.45 kg/m.   General appearance:  Normal Thyroid:  Symmetrical, normal in size, without palpable masses or nodularity. Respiratory  Auscultation:  Clear without wheezing or rhonchi Cardiovascular  Auscultation:  Regular rate, without rubs, murmurs or gallops  Edema/varicosities:  Not grossly evident Abdominal  Soft,nontender, without masses, guarding or rebound.  Liver/spleen:  No organomegaly noted  Hernia:  None appreciated  Skin  Inspection:  Grossly normal   Breasts: Examined lying and sitting.     Right: Without masses, retractions, discharge or axillary adenopathy.     Left: Without masses, retractions, discharge or axillary adenopathy. Gentitourinary   Inguinal/mons:  Normal without inguinal adenopathy  External genitalia:  Normal  BUS/Urethra/Skene's glands:  Normal  Vagina:  Normal  Cervix:  Normal  Uterus:   normal in size, shape and contour.  Midline and mobile  Adnexa/parametria:     Rt: Without masses or tenderness.   Lt: Without masses or tenderness.  Anus and perineum: Normal  Digital rectal exam: Normal sphincter tone without palpated masses  or tenderness  Assessment/Plan:  61 y.o. MWF G2 P2 for annual exam with no complaints.  Postmenopausal/no bleeding on Vagifem Psoriatic arthritis-rheumatologist managing Anxiety-occasional Xanax use Primary care-labs  Plan: Vagifem prescription, proper use, continue twice weekly vaginal inserts. Vaginal lubricants as needed. Xanax 0.25 prescription, proper use given and reviewed addictive properties into use sparingly. SBE's, continue annual screening mammogram, calcium rich diet, vitamin D 2000 daily encouraged. Reviewed importance of exercise, home safety, fall prevention discussed. Pap normal 2016, new screening guidelines reviewed.    [place], 1:30 PM 10/23/2017

## 2017-11-02 ENCOUNTER — Ambulatory Visit (INDEPENDENT_AMBULATORY_CARE_PROVIDER_SITE_OTHER): Payer: PRIVATE HEALTH INSURANCE

## 2017-11-02 DIAGNOSIS — J309 Allergic rhinitis, unspecified: Secondary | ICD-10-CM | POA: Diagnosis not present

## 2017-11-15 ENCOUNTER — Encounter: Payer: Self-pay | Admitting: Gastroenterology

## 2017-11-15 ENCOUNTER — Ambulatory Visit (INDEPENDENT_AMBULATORY_CARE_PROVIDER_SITE_OTHER): Payer: 59 | Admitting: Gastroenterology

## 2017-11-15 VITALS — BP 118/76 | HR 96 | Ht 65.0 in | Wt 189.5 lb

## 2017-11-15 DIAGNOSIS — R1310 Dysphagia, unspecified: Secondary | ICD-10-CM | POA: Diagnosis not present

## 2017-11-15 DIAGNOSIS — K449 Diaphragmatic hernia without obstruction or gangrene: Secondary | ICD-10-CM

## 2017-11-15 DIAGNOSIS — R1319 Other dysphagia: Secondary | ICD-10-CM

## 2017-11-15 DIAGNOSIS — Z1211 Encounter for screening for malignant neoplasm of colon: Secondary | ICD-10-CM

## 2017-11-15 MED ORDER — NA SULFATE-K SULFATE-MG SULF 17.5-3.13-1.6 GM/177ML PO SOLN: 1.0000 | Freq: Once | ORAL | 0 refills | Status: AC

## 2017-11-15 NOTE — Progress Notes (Signed)
[facility] Consult Note:  History: Melissa Middleton 11/15/2017  Referring physician: Debbrah Alar, NP  Reason for consult/chief complaint: Dysphagia and Colon Cancer Screening   Subjective  HPI:  This is a 61 year old woman last seen in this clinic 10 years ago. At point she had an upper endoscopy for dysphagia, revealing a hiatal hernia and a distal stricture which dilation was performed. A colonoscopy at that time revealed diverticulosis but no polyps. She has occasional heartburn and occasional dysphagia to solids or pills with a [place] briefly hung up in the neck or upper chest. She denies odynophagia, nausea, vomiting, early satiety or weight loss. Bowel habits are regular without rectal bleeding.  ROS:  Review of Systems  Constitutional: Positive for fatigue. Negative for appetite change and unexpected weight change.  HENT: Negative for mouth sores and voice change.   Eyes: Negative for pain and redness.  Respiratory: Negative for cough and shortness of breath.   Cardiovascular: Negative for chest pain and palpitations.  Genitourinary: Negative for dysuria and hematuria.  Musculoskeletal: Positive for arthralgias. Negative for myalgias.  Skin: Negative for pallor and rash.  Neurological: Negative for weakness and headaches.  Hematological: Negative for adenopathy.   she recently started Humira for her psoriatic arthritis, and it has apparently worked very well for her. She is bothered by profound fatigue that she attributes to her rheumatologic condition and perhaps a Humira. She is under the care of a local rheumatologist.  Past Medical History: Past Medical History:  Diagnosis Date  . Allergic rhinitis   . Anxiety   . Asthma   . Benign neoplasm of stomach    gastric polyps  . Bronchitis    recurrent  . Diaphragmatic hernia without mention of obstruction or gangrene   . Diverticulosis of colon (without mention of hemorrhage)   .  Esophageal reflux    see GI  . GERD (gastroesophageal reflux disease)   . Insomnia   . Osteoarthritis    sees ortho-has had steroid shot in knee  . Pneumonia   . Post menopausal problems    symptoms  . Premenstrual tension syndromes    gyn uses alprazolam and spironolactone prn ofr treatment  . Psoriasis   . Psoriatic arthritis ([place])   . Sinusitis    recurrent  . Status post dilation of esophageal narrowing   . Stricture and stenosis of esophagus   . Unspecified asthma(493.90)   . Vitamin D deficiency      Past Surgical History: Past Surgical History:  Procedure Laterality Date  . CESAREAN SECTION    . ENDOMETRIAL ABLATION    . NASAL SINUS SURGERY    . TOTAL KNEE ARTHROPLASTY Right 11/2009  . TUBAL LIGATION  2004   with ablation     Family History: Family History  Problem Relation Age of Onset  . Hypertension Mother        died from an asthma attack  . Asthma Mother   . Diabetes Father        died from sepsis  . Hypertension Father   . Arthritis Father   . Ovarian cancer Sister   . Melanoma Sister   . Hypertension Maternal Grandfather   . Heart attack Maternal Grandfather   . Diabetes Paternal Grandmother   . Arthritis Paternal Grandmother   . Migraines Neg Hx     Social History: Social History   Socioeconomic History  . Marital status: Married    Spouse name: None  . Number of children: 2  .  Years of education: None  . Highest education level: None  Social Needs  . Financial resource strain: None  . Food insecurity - worry: None  . Food insecurity - inability: None  . Transportation needs - medical: None  . Transportation needs - non-medical: None  Occupational History    Employer: UNEMPLOYED    Comment: Administrative work  Tobacco Use  . Smoking status: Never Smoker  . Smokeless tobacco: Never Used  Substance and Sexual Activity  . Alcohol use: No    Alcohol/week: 0.0 oz  . Drug use: No  . Sexual activity: Yes    Comment: INTERCOURSE  AGE 48, SEXUAL PARTNERS LESS THAN 5  Other Topics Concern  . None  Social History Narrative   Daughter age 88   Son age 41   Married   Enjoys Chief Executive Officer, [employer] shows    Allergies: Allergies  Allergen Reactions  . Macrodantin [Nitrofurantoin Macrocrystal] Itching and Rash  . Cefuroxime Axetil Rash and Other (See Comments)    REACTION: red face/rash  . Penicillins Rash    Outpatient Meds: Current Outpatient Medications  Medication Sig Dispense Refill  . Adalimumab 40 MG/0.8ML PNKT Inject 40 mg into the skin.    Marland Kitchen albuterol (PROVENTIL HFA;VENTOLIN HFA) 108 (90 Base) MCG/ACT inhaler Inhale 2 puffs into the lungs every 4 (four) hours as needed for wheezing or shortness of breath. 8 g 1  . ALPRAZolam (XANAX) 0.25 MG tablet TAKE 1 TABLET AT BEDTIME AS NEEDED FOR ANXIETY 90 tablet 1  . ammonium lactate (AMLACTIN) 12 % cream APPLY TO AREA TWICE A DAY 140 g 1  . ASMANEX 60 METERED DOSES 220 MCG/INH inhaler INHALE 2 PUFFS INTO THE LUNGS DAILY. 3 Inhaler 0  . doxycycline (VIBRA-TABS) 100 MG tablet Take 1 tablet by mouth 2 (two) times daily. For 7 days    . esomeprazole (NEXIUM) 40 MG capsule TAKE 1 CAPSULE BY MOUTH DAILY BEFORE BREAKFAST. 90 capsule 1  . Estradiol 10 MCG TABS vaginal tablet Place 1 tablet (10 mcg total) 2 (two) times a week vaginally. 12 tablet 4  . fluticasone (FLONASE) 50 MCG/ACT nasal spray TWO SPRAYS EACH NOSTRIL ONCE A DAY FOR NASAL CONGESTION OR DRAINAGE. 16 g 5  . montelukast (SINGULAIR) 10 MG tablet Take 1 tablet (10 mg total) by mouth at bedtime. 90 tablet 1  . spironolactone (ALDACTONE) 25 MG tablet Take 1 tablet (25 mg total) daily as needed by mouth. 90 tablet 0  . traZODone (DESYREL) 100 MG tablet Take 1 tablet (100 mg total) by mouth at bedtime. 90 tablet 0  . triamcinolone cream (KENALOG) 0.1 % Apply 1 application topically 2 (two) times daily. 15 g 0  . EPINEPHrine (EPIPEN 2-PAK) 0.3 mg/0.3 mL IJ SOAJ injection Use as directed for severe allergic reactions  (Patient not taking: Reported on 10/23/2017) 2 Device 2  . Na Sulfate-K Sulfate-Mg Sulf 17.5-3.13-1.6 GM/177ML SOLN Take 1 kit by mouth once for 1 dose. 354 mL 0   No current facility-administered medications for this visit.       ___________________________________________________________________ Objective   Exam:  BP 118/76 (BP Location: Left Arm, Patient Position: Sitting, Cuff Size: Normal)   Pulse 96   Ht _0  (1.651 m) Comment: height measured without shoes  Wt 189 lb 8 oz (86 kg)   BMI 31.53 kg/m    General: this is a(n) well-appearing woman   Eyes: sclera anicteric, no redness  ENT: oral mucosa moist without lesions, no cervical or supraclavicular lymphadenopathy, good dentition  CV: RRR without murmur, S1/S2, no JVD, no peripheral edema  Resp: clear to auscultation bilaterally, normal RR and effort noted  GI: soft, no tenderness, with active bowel sounds. No guarding or palpable organomegaly noted.  Skin; warm and dry, no rash or jaundice noted  Neuro: awake, alert and oriented x 3. Normal gross motor function and fluent speech  Labs:  CBC Latest Ref Rng & Units 08/25/2017 10/26/2015 12/10/2014  WBC 4.0 - 10.5 K/uL 5.3 7.7 5.9  Hemoglobin 12.0 - 15.0 g/dL 12.9 13.1 12.4  Hematocrit 36.0 - 46.0 % 39.2 40.2 38.7  Platelets 150.0 - 400.0 K/uL 234.0 300.0 266.0   CMP Latest Ref Rng & Units 08/25/2017 10/26/2015 12/10/2014  Glucose 70 - 99 mg/dL 94 99 91  BUN 6 - 23 mg/dL _0 Creatinine 0.40 - 1.20 mg/dL 0.76 0.81 0.9  Sodium 135 - 145 mEq/L 138 142 135  Potassium 3.5 - 5.1 mEq/L 4.9 3.6 4.1  Chloride 96 - 112 mEq/L 102 103 103  CO2 19 - 32 mEq/L _1 Calcium 8.4 - 10.5 mg/dL 10.0 9.6 9.2  Total Protein 6.0 - 8.3 g/dL 7.0 7.2 7.4  Total Bilirubin 0.2 - 1.2 mg/dL 0.4 0.3 0.4  Alkaline Phos 39 - 117 U/L 57 73 72  AST 0 - 37 U/L _2 ALT 0 - 35 U/L _3 Assessment: Encounter Diagnoses  Name Primary?  . Esophageal dysphagia Yes  .  Hiatal hernia   . Special screening for malignant neoplasms, colon     It is difficult to tell if her dysphagia is from a stricture or motility issue. She seems to have very little heartburn or regurgitation. I advised her to have an upper endoscopy to see if there is any lesion amenable to dilation. She is agreeable, and also would like to have a colonoscopy since it is been 10 years from the last one. Plan:  EGD and colonoscopy  The benefits and risks of the planned procedure were described in detail with the patient or (when appropriate) their health care proxy.  Risks were outlined as including, but not limited to, bleeding, infection, perforation, adverse medication reaction leading to cardiac or pulmonary decompensation, or pancreatitis (if ERCP).  The limitation of incomplete mucosal visualization was also discussed.  No guarantees or warranties were given.   Thank you for the courtesy of this consult.  Please call me with any questions or concerns.  [name]  CC: Debbrah Alar, NP

## 2017-11-15 NOTE — Patient Instructions (Signed)
If you are age 61 or older, your body mass index should be between 23-30. Your Body mass index is 31.53 kg/m. If this is out of the aforementioned range listed, please consider follow up with your Primary Care Provider.  If you are age 68 or younger, your body mass index should be between 19-25. Your Body mass index is 31.53 kg/m. If this is out of the aformentioned range listed, please consider follow up with your Primary Care Provider.   You have been scheduled for an endoscopy and colonoscopy. Please follow the written instructions given to you at your visit today. Please pick up your prep supplies at the pharmacy within the next 1-3 days. If you use inhalers (even only as needed), please bring them with you on the day of your procedure. Your physician has requested that you go to www.startemmi.com and enter the access code given to you at your visit today. This web site gives a general overview about your procedure. However, you should still follow specific instructions given to you by our office regarding your preparation for the procedure.  Thank you for choosing [facility]  Dr Wilfrid Lund III

## 2017-11-22 ENCOUNTER — Ambulatory Visit (INDEPENDENT_AMBULATORY_CARE_PROVIDER_SITE_OTHER): Payer: PRIVATE HEALTH INSURANCE

## 2017-11-22 DIAGNOSIS — J309 Allergic rhinitis, unspecified: Secondary | ICD-10-CM

## 2017-11-25 ENCOUNTER — Other Ambulatory Visit: Payer: Self-pay | Admitting: Family

## 2017-11-30 ENCOUNTER — Ambulatory Visit (INDEPENDENT_AMBULATORY_CARE_PROVIDER_SITE_OTHER): Payer: PRIVATE HEALTH INSURANCE

## 2017-11-30 DIAGNOSIS — J309 Allergic rhinitis, unspecified: Secondary | ICD-10-CM

## 2017-12-07 ENCOUNTER — Ambulatory Visit (INDEPENDENT_AMBULATORY_CARE_PROVIDER_SITE_OTHER): Payer: PRIVATE HEALTH INSURANCE

## 2017-12-07 DIAGNOSIS — J309 Allergic rhinitis, unspecified: Secondary | ICD-10-CM

## 2017-12-18 ENCOUNTER — Ambulatory Visit (INDEPENDENT_AMBULATORY_CARE_PROVIDER_SITE_OTHER): Payer: PRIVATE HEALTH INSURANCE

## 2017-12-18 DIAGNOSIS — J309 Allergic rhinitis, unspecified: Secondary | ICD-10-CM | POA: Diagnosis not present

## 2017-12-22 ENCOUNTER — Other Ambulatory Visit: Payer: Self-pay | Admitting: Family

## 2017-12-22 NOTE — Telephone Encounter (Signed)
Last OV 08/25/17 and due for 6 month f/u 02/22/18. Refill sent for 90 day supply of nexium. Mychart message sent re: need to schedule appt.

## 2017-12-26 ENCOUNTER — Ambulatory Visit (INDEPENDENT_AMBULATORY_CARE_PROVIDER_SITE_OTHER): Payer: PRIVATE HEALTH INSURANCE

## 2017-12-26 DIAGNOSIS — J309 Allergic rhinitis, unspecified: Secondary | ICD-10-CM | POA: Diagnosis not present

## 2018-01-04 ENCOUNTER — Encounter: Payer: Self-pay | Admitting: Gastroenterology

## 2018-01-10 ENCOUNTER — Ambulatory Visit (AMBULATORY_SURGERY_CENTER): Payer: 59 | Admitting: Gastroenterology

## 2018-01-10 ENCOUNTER — Encounter: Payer: Self-pay | Admitting: Gastroenterology

## 2018-01-10 ENCOUNTER — Other Ambulatory Visit: Payer: Self-pay

## 2018-01-10 VITALS — BP 111/66 | HR 64 | Temp 97.7°F | Resp 12 | Ht 65.0 in | Wt 189.0 lb

## 2018-01-10 DIAGNOSIS — Z1211 Encounter for screening for malignant neoplasm of colon: Secondary | ICD-10-CM

## 2018-01-10 DIAGNOSIS — Z1212 Encounter for screening for malignant neoplasm of rectum: Secondary | ICD-10-CM

## 2018-01-10 DIAGNOSIS — R1310 Dysphagia, unspecified: Secondary | ICD-10-CM

## 2018-01-10 MED ORDER — SODIUM CHLORIDE 0.9 % IV SOLN: 500.0000 mL | Freq: Once | INTRAVENOUS | Status: DC

## 2018-01-10 NOTE — Op Note (Signed)
[facility] Patient Name: Melissa Middleton Procedure Date: 01/10/2018 10:14 AM MRN: [id number] Endoscopist: Mallie Mussel L. Loletha Carrow , MD Age: 62 Referring MD:  Date of Birth: [birth year] Gender: Female Account #: [id number] Procedure:                Colonoscopy Indications:              Screening for colorectal malignant neoplasm (no                            polyps on 2008 colonoscopy) Medicines:                Monitored Anesthesia Care Procedure:                Pre-Anesthesia Assessment:                           - Prior to the procedure, a History and Physical                            was performed, and patient medications and                            allergies were reviewed. The patient's tolerance of                            previous anesthesia was also reviewed. The risks                            and benefits of the procedure and the sedation                            options and risks were discussed with the patient.                            All questions were answered, and informed consent                            was obtained. Prior Anticoagulants: The patient has                            taken no previous anticoagulant or antiplatelet                            agents. ASA Grade Assessment: II - A patient with                            mild systemic disease. After reviewing the risks                            and benefits, the patient was deemed in                            satisfactory condition to undergo the procedure.  After obtaining informed consent, the colonoscope                            was passed under direct vision. Throughout the                            procedure, the patient's blood pressure, pulse, and                            oxygen saturations were monitored continuously. The                            Model CF-HQ190L [id number]) scope was introduced                            through the anus and advanced to  the the cecum,                            identified by appendiceal orifice and ileocecal                            valve. The colonoscopy was performed without                            difficulty. The patient tolerated the procedure                            well. The quality of the bowel preparation was                            excellent. The ileocecal valve, appendiceal                            orifice, and rectum were photographed. The quality                            of the bowel preparation was evaluated using the                            BBPS [facility] Bowel Preparation Scale) with scores                            of: Right Colon = 3, Transverse Colon = 3 and Left                            Colon = 3 (entire mucosa seen well with no residual                            staining, small fragments of stool or opaque                            liquid). The total BBPS score equals 9. Scope In: 10:27:27 AM Scope Out: 10:39:12 AM Scope Withdrawal Time:  0 hours 8 minutes 10 seconds  Total Procedure Duration: 0 hours 11 minutes 45 seconds  Findings:                 The perianal and digital rectal examinations were                            normal.                           Multiple small-mouthed diverticula were found in                            the left colon.                           The exam was otherwise without abnormality on                            direct and retroflexion views. Complications:            No immediate complications. Estimated Blood Loss:     Estimated blood loss: none. Impression:               - Diverticulosis in the left colon.                           - The examination was otherwise normal on direct                            and retroflexion views.                           - No specimens collected. Recommendation:           - Patient has a contact number available for                            emergencies. The signs and symptoms of potential                             delayed complications were discussed with the                            patient. Return to normal activities tomorrow.                            Written discharge instructions were provided to the                            patient.                           - Resume previous diet.                           - Continue present medications.                           -  Repeat colonoscopy in 10 years for screening                            purposes. Alea Ryer L. Loletha Carrow, MD 01/10/2018 10:53:01 AM This report has been signed electronically.

## 2018-01-10 NOTE — Op Note (Signed)
[name] Procedure Date: 01/10/2018 10:14 AM MRN: [id number] Endoscopist: Mallie Mussel L. Loletha Carrow , MD Age: 62 Referring MD:  Date of Birth: Jun 22, 1956 Gender: Female Account #: [id number] Procedure:                Upper GI endoscopy Indications:              Pharyngeal phase dysphagia Medicines:                Monitored Anesthesia Care Procedure:                Pre-Anesthesia Assessment:                           - Prior to the procedure, a History and Physical                            was performed, and patient medications and                            allergies were reviewed. The patient's tolerance of                            previous anesthesia was also reviewed. The risks                            and benefits of the procedure and the sedation                            options and risks were discussed with the patient.                            All questions were answered, and informed consent                            was obtained. Prior Anticoagulants: The patient has                            taken no previous anticoagulant or antiplatelet                            agents. ASA Grade Assessment: II - A patient with                            mild systemic disease. After reviewing the risks                            and benefits, the patient was deemed in                            satisfactory condition to undergo the procedure.                           After obtaining informed consent, the endoscope was  passed under direct vision. Throughout the                            procedure, the patient's blood pressure, pulse, and                            oxygen saturations were monitored continuously. The                            Endoscope was introduced through the mouth, and                            advanced to the second part of duodenum. The upper                            GI endoscopy was  accomplished without difficulty.                            The patient tolerated the procedure well. Scope In: Scope Out: Findings:                 The larynx was normal.                           An 8 cm hiatal hernia with a para-esophageal                            component was present.                           The exam of the esophagus was otherwise normal.                           The lower third of the esophagus was moderately                            tortuous.                           A few small sessile fundic gland polyps were found                            in the gastric fundus.                           The cardia and gastric fundus were normal on                            retroflexion.                           The examined duodenum was normal. Complications:            No immediate complications. Estimated Blood Loss:     Estimated blood loss: none. Impression:               - Normal larynx.                           -  8 cm hiatal hernia.                           - Tortuous esophagus.                           - A few fundic gland polyps. Common and benign                            finding.                           - Normal examined duodenum.                           - No specimens collected.                           Unclear how much the hiatal hernia is contributing                            to the dysphagia, which is intermittent and sounds                            more like cricopharyngeal dysfunction. Recommendation:           - Patient has a contact number available for                            emergencies. The signs and symptoms of potential                            delayed complications were discussed with the                            patient. Return to normal activities tomorrow.                            Written discharge instructions were provided to the                            patient.                           - Resume previous  diet.                           - Continue present medications.                           - See the other procedure note for documentation of                            additional recommendations. Latysha Thackston L. Loletha Carrow, MD 01/10/2018 10:51:23 AM This report has been signed electronically.

## 2018-01-10 NOTE — Patient Instructions (Signed)
YOU HAD AN ENDOSCOPIC PROCEDURE TODAY AT [facility]:   Refer to the procedure report that was given to you for any specific questions about what was found during the examination.  If the procedure report does not answer your questions, please call your gastroenterologist to clarify.  If you requested that your care partner not be given the details of your procedure findings, then the procedure report has been included in a sealed envelope for you to review at your convenience later.  YOU SHOULD EXPECT: Some feelings of bloating in the abdomen. Passage of more gas than usual.  Walking can help get rid of the air that was put into your GI tract during the procedure and reduce the bloating. If you had a lower endoscopy (such as a colonoscopy or flexible sigmoidoscopy) you may notice spotting of blood in your stool or on the toilet paper. If you underwent a bowel prep for your procedure, you may not have a normal bowel movement for a few days.  Please Note:  You might notice some irritation and congestion in your nose or some drainage.  This is from the oxygen used during your procedure.  There is no need for concern and it should clear up in a day or so.  SYMPTOMS TO REPORT IMMEDIATELY:   Following lower endoscopy (colonoscopy or flexible sigmoidoscopy):  Excessive amounts of blood in the stool  Significant tenderness or worsening of abdominal pains  Swelling of the abdomen that is new, acute  Fever of 100F or higher   Following upper endoscopy (EGD)  Vomiting of blood or coffee ground material  New chest pain or pain under the shoulder blades  Painful or persistently difficult swallowing  New shortness of breath  Fever of 100F or higher  Black, tarry-looking stools  For urgent or emergent issues, a gastroenterologist can be reached at any hour by calling [phone].   DIET:  We do recommend a small meal at first, but then you may proceed to your regular diet.  Drink  plenty of fluids but you should avoid alcoholic beverages for 24 hours.  ACTIVITY:  You should plan to take it easy for the rest of today and you should NOT DRIVE or use heavy machinery until tomorrow (because of the sedation medicines used during the test).    FOLLOW UP: Our staff will call the number listed on your records the next business day following your procedure to check on you and address any questions or concerns that you may have regarding the information given to you following your procedure. If we do not reach you, we will leave a message.  However, if you are feeling well and you are not experiencing any problems, there is no need to return our call.  We will assume that you have returned to your regular daily activities without incident.  If any biopsies were taken you will be contacted by phone or by letter within the next 1-3 weeks.  Please call us at [phone] if you have not heard about the biopsies in 3 weeks.   Repeat screening Colonoscopy in 10 year Diverticulosis (handout given) Hiatal Hernia (handout given)  SIGNATURES/CONFIDENTIALITY: You and/or your care partner have signed paperwork which will be entered into your electronic medical record.  These signatures attest to the fact that that the information above on your After Visit Summary has been reviewed and is understood.  Full responsibility of the confidentiality of this discharge information lies with you and/or your  care-partner.

## 2018-01-11 ENCOUNTER — Telehealth: Payer: Self-pay

## 2018-01-11 NOTE — Telephone Encounter (Signed)
  Follow up Call-  Call back number 01/10/2018  Post procedure Call Back phone  # [phone]  Permission to leave phone message Yes  Some recent data might be hidden     Patient questions:  Do you have a fever, pain , or abdominal swelling? No. Pain Score  0 *  Have you tolerated food without any problems? Yes.    Have you been able to return to your normal activities? Yes.    Do you have any questions about your discharge instructions: Diet   No. Medications  No. Follow up visit  No.  Do you have questions or concerns about your Care? No.  Actions: * If pain score is 4 or above: No action needed, pain <4.

## 2018-01-12 DIAGNOSIS — M542 Cervicalgia: Secondary | ICD-10-CM | POA: Insufficient documentation

## 2018-01-12 DIAGNOSIS — M5416 Radiculopathy, lumbar region: Secondary | ICD-10-CM | POA: Insufficient documentation

## 2018-01-12 DIAGNOSIS — M545 Low back pain, unspecified: Secondary | ICD-10-CM | POA: Insufficient documentation

## 2018-01-24 ENCOUNTER — Ambulatory Visit (INDEPENDENT_AMBULATORY_CARE_PROVIDER_SITE_OTHER): Payer: PRIVATE HEALTH INSURANCE

## 2018-01-24 DIAGNOSIS — J309 Allergic rhinitis, unspecified: Secondary | ICD-10-CM | POA: Diagnosis not present

## 2018-01-25 ENCOUNTER — Telehealth: Payer: Self-pay | Admitting: *Deleted

## 2018-01-25 NOTE — Telephone Encounter (Signed)
Received request for Medical records from [employer], forwarded to [place] for email/scan/SLS 02/07    

## 2018-02-07 ENCOUNTER — Telehealth: Payer: Self-pay | Admitting: *Deleted

## 2018-02-07 MED ORDER — MONTELUKAST SODIUM 10 MG PO TABS: 10.0000 mg | ORAL_TABLET | Freq: Every day | ORAL | 0 refills | Status: DC

## 2018-02-07 NOTE — Telephone Encounter (Signed)
Received fax from [facility] requesting refill of montelukast. Refill sent. Pt is due for follow up 02/22/18, mychart reminder sent.

## 2018-02-09 ENCOUNTER — Other Ambulatory Visit: Payer: Self-pay

## 2018-02-09 ENCOUNTER — Telehealth: Payer: Self-pay | Admitting: *Deleted

## 2018-02-09 MED ORDER — MONTELUKAST SODIUM 10 MG PO TABS: 10.0000 mg | ORAL_TABLET | Freq: Every day | ORAL | 0 refills | Status: DC

## 2018-02-09 NOTE — Telephone Encounter (Signed)
Received request for Medical records from [facility], forwarded to [place] for email/scan/SLS 02/22

## 2018-02-21 ENCOUNTER — Ambulatory Visit (INDEPENDENT_AMBULATORY_CARE_PROVIDER_SITE_OTHER): Payer: PRIVATE HEALTH INSURANCE

## 2018-02-21 DIAGNOSIS — J309 Allergic rhinitis, unspecified: Secondary | ICD-10-CM

## 2018-02-27 ENCOUNTER — Ambulatory Visit: Payer: 59 | Admitting: Family

## 2018-02-27 ENCOUNTER — Encounter: Payer: Self-pay | Admitting: Family

## 2018-02-27 VITALS — BP 119/70 | HR 76 | Resp 16 | Ht 65.0 in | Wt 191.6 lb

## 2018-02-27 DIAGNOSIS — J45909 Unspecified asthma, uncomplicated: Secondary | ICD-10-CM

## 2018-02-27 DIAGNOSIS — L4050 Arthropathic psoriasis, unspecified: Secondary | ICD-10-CM | POA: Diagnosis not present

## 2018-02-27 DIAGNOSIS — K219 Gastro-esophageal reflux disease without esophagitis: Secondary | ICD-10-CM

## 2018-02-27 DIAGNOSIS — G4700 Insomnia, unspecified: Secondary | ICD-10-CM

## 2018-02-27 MED ORDER — TRAZODONE HCL 100 MG PO TABS: ORAL_TABLET | ORAL | 1 refills | Status: DC

## 2018-02-27 MED ORDER — ESOMEPRAZOLE MAGNESIUM 40 MG PO CPDR: DELAYED_RELEASE_CAPSULE | ORAL | 1 refills | Status: DC

## 2018-02-27 MED ORDER — MONTELUKAST SODIUM 10 MG PO TABS: 10.0000 mg | ORAL_TABLET | Freq: Every day | ORAL | 1 refills | Status: DC

## 2018-02-27 NOTE — Progress Notes (Signed)
Subjective:    Patient ID: Melissa Middleton, female    DOB: [birth year], 62 y.o.   MRN: [id number]  HPI   Patient is a 62 yr old female who presents today for follow up.  GERD- stable "as long as I take it."   Asthma- stable not using asthmanex.  Insomnia- stable on trazodone.  Sleeps well.  psoriatitc arthritis- on humira.     Review of Systems Se HPI  Past Medical History:  Diagnosis Date  . Allergic rhinitis   . Anxiety   . Asthma   . Benign neoplasm of stomach    gastric polyps  . Bronchitis    recurrent  . Diaphragmatic hernia without mention of obstruction or gangrene   . Diverticulosis of colon (without mention of hemorrhage)   . Esophageal reflux    see GI  . GERD (gastroesophageal reflux disease)   . Insomnia   . Osteoarthritis    sees ortho-has had steroid shot in knee  . Pneumonia   . Post menopausal problems    symptoms  . Premenstrual tension syndromes    gyn uses alprazolam and spironolactone prn ofr treatment  . Psoriasis   . Psoriatic arthritis ([place])   . Sinusitis    recurrent  . Status post dilation of esophageal narrowing   . Stricture and stenosis of esophagus   . Unspecified asthma(493.90)   . Vitamin D deficiency      Social History   Socioeconomic History  . Marital status: Married    Spouse name: Not on file  . Number of children: 2  . Years of education: Not on file  . Highest education level: Not on file  Social Needs  . Financial resource strain: Not on file  . Food insecurity - worry: Not on file  . Food insecurity - inability: Not on file  . Transportation needs - medical: Not on file  . Transportation needs - non-medical: Not on file  Occupational History    Employer: UNEMPLOYED    Comment: Administrative work  Tobacco Use  . Smoking status: Never Smoker  . Smokeless tobacco: Never Used  Substance and Sexual Activity  . Alcohol use: No    Alcohol/week: 0.0 oz  . Drug use: No  . Sexual activity: Yes    Comment:  INTERCOURSE AGE 51, SEXUAL PARTNERS LESS THAN 5  Other Topics Concern  . Not on file  Social History Narrative   Daughter age 64   Son age 65   Married   Enjoys Chief Executive Officer, [employer] shows    Past Surgical History:  Procedure Laterality Date  . CESAREAN SECTION    . ENDOMETRIAL ABLATION    . NASAL SINUS SURGERY    . TOTAL KNEE ARTHROPLASTY Right 11/2009  . TUBAL LIGATION  2004   with ablation    Family History  Problem Relation Age of Onset  . Hypertension Mother        died from an asthma attack  . Asthma Mother   . Diabetes Father        died from sepsis  . Hypertension Father   . Arthritis Father   . Ovarian cancer Sister   . Melanoma Sister   . Hypertension Maternal Grandfather   . Heart attack Maternal Grandfather   . Diabetes Paternal Grandmother   . Arthritis Paternal Grandmother   . Migraines Neg Hx     Allergies  Allergen Reactions  . Macrodantin [Nitrofurantoin Macrocrystal] Itching and Rash  . Cefuroxime Axetil Rash and  Other (See Comments)    REACTION: red face/rash  . Penicillins Rash    Current Outpatient Medications on File Prior to Visit  Medication Sig Dispense Refill  . Adalimumab 40 MG/0.8ML PNKT Inject 40 mg into the skin.    Marland Kitchen albuterol (PROVENTIL HFA;VENTOLIN HFA) 108 (90 Base) MCG/ACT inhaler Inhale 2 puffs into the lungs every 4 (four) hours as needed for wheezing or shortness of breath. 8 g 1  . ALPRAZolam (XANAX) 0.25 MG tablet TAKE 1 TABLET AT BEDTIME AS NEEDED FOR ANXIETY 90 tablet 1  . ammonium lactate (AMLACTIN) 12 % cream APPLY TO AREA TWICE A DAY 140 g 1  . ASMANEX 60 METERED DOSES 220 MCG/INH inhaler INHALE 2 PUFFS INTO THE LUNGS DAILY. 3 Inhaler 0  . EPINEPHrine (EPIPEN 2-PAK) 0.3 mg/0.3 mL IJ SOAJ injection Use as directed for severe allergic reactions 2 Device 2  . esomeprazole (NEXIUM) 40 MG capsule TAKE 1 CAPSULE BY MOUTH DAILY BEFORE BREAKFAST. 90 capsule 0  . Estradiol 10 MCG TABS vaginal tablet Place 1 tablet (10 mcg total) 2  (two) times a week vaginally. 12 tablet 4  . fluticasone (FLONASE) 50 MCG/ACT nasal spray TWO SPRAYS EACH NOSTRIL ONCE A DAY FOR NASAL CONGESTION OR DRAINAGE. 16 g 5  . montelukast (SINGULAIR) 10 MG tablet Take 1 tablet (10 mg total) by mouth at bedtime. 90 tablet 0  . spironolactone (ALDACTONE) 25 MG tablet Take 1 tablet (25 mg total) daily as needed by mouth. 90 tablet 0  . traZODone (DESYREL) 100 MG tablet TAKE 1 TABLET BY MOUTH EVERYDAY AT BEDTIME 90 tablet 0  . triamcinolone cream (KENALOG) 0.1 % Apply 1 application topically 2 (two) times daily. 15 g 0   Current Facility-Administered Medications on File Prior to Visit  Medication Dose Route Frequency Provider Last Rate Last Dose  . 0.9 %  sodium chloride infusion  500 mL Intravenous Once Danis, Estill Cotta III, MD        BP 119/70 (BP Location: Right Arm, Patient Position: Sitting, Cuff Size: Large)   Pulse 76   Resp 16   Ht 5\' 5"  (1.651 m)   Wt 191 lb 9.6 oz (86.9 kg)   SpO2 100%   BMI 31.88 kg/m       Objective:   Physical Exam  Constitutional: She is oriented to person, place, and time. She appears well-developed and well-nourished.  HENT:  Head: Normocephalic and atraumatic.  Cardiovascular: Normal rate, regular rhythm and normal heart sounds.  No murmur heard. Pulmonary/Chest: Effort normal and breath sounds normal. No respiratory distress. She has no wheezes.  Musculoskeletal: She exhibits no edema.  Neurological: She is alert and oriented to person, place, and time.  Psychiatric: She has a normal mood and affect. Her behavior is normal. Judgment and thought content normal.          Assessment & Plan:  GERD-stable on proton pump inhibitor.  Continue same.  Psoriatic arthritis-currently stable on Humira continue same.  She is followed by rheumatology.  Asthma-symptoms currently stable.  Off of Asmanex.  Continue to monitor.  Insomnia- stable on trazodone continue same.

## 2018-03-06 ENCOUNTER — Other Ambulatory Visit: Payer: Self-pay | Admitting: Obstetrics & Gynecology

## 2018-03-15 ENCOUNTER — Other Ambulatory Visit: Payer: Self-pay | Admitting: Women's Health

## 2018-03-16 NOTE — Telephone Encounter (Signed)
You and Dr. Moshe Salisbury have both prescribed it in the past. Most recently you prescribed it at her 10/23/2017 CE #90 with no refills.   Dr. Durenda Guthrie note from 06/27/2014 states "She takes Aldactone 25 mg by mouth when necessary fluid retention.Melissa Middleton"

## 2018-03-16 NOTE — Telephone Encounter (Signed)
I don't see where we have given? How often does she take? Who originally gave her?

## 2018-03-16 NOTE — Telephone Encounter (Signed)
Sorry guess I did not read enough, ok for rx

## 2018-03-18 ENCOUNTER — Other Ambulatory Visit: Payer: Self-pay | Admitting: Obstetrics & Gynecology

## 2018-03-19 ENCOUNTER — Other Ambulatory Visit: Payer: Self-pay

## 2018-03-19 NOTE — Telephone Encounter (Signed)
Looks like this was last refilled less than 2 weeks ago?  If not refilled okay

## 2018-03-19 NOTE — Telephone Encounter (Signed)
I actually called pharmacy when they sent that refill on 03/06/18 and they told me that she still had a refill from November left and just to ignore that refill request they would use the refill from November. I will just refuse this with that note. Sorry I forgot about that.

## 2018-03-19 NOTE — Telephone Encounter (Signed)
I spoke with pharmacy. Melissa Middleton did get the refill from November filled on 03/12/18.  Pharmacy was not sure why this was sent today and said to just ignore.

## 2018-03-20 ENCOUNTER — Ambulatory Visit (INDEPENDENT_AMBULATORY_CARE_PROVIDER_SITE_OTHER): Payer: PRIVATE HEALTH INSURANCE

## 2018-03-20 DIAGNOSIS — J309 Allergic rhinitis, unspecified: Secondary | ICD-10-CM

## 2018-03-27 DIAGNOSIS — M4802 Spinal stenosis, cervical region: Secondary | ICD-10-CM | POA: Insufficient documentation

## 2018-03-27 DIAGNOSIS — M5136 Other intervertebral disc degeneration, lumbar region: Secondary | ICD-10-CM | POA: Insufficient documentation

## 2018-03-27 DIAGNOSIS — M5126 Other intervertebral disc displacement, lumbar region: Secondary | ICD-10-CM | POA: Insufficient documentation

## 2018-04-02 DIAGNOSIS — J3089 Other allergic rhinitis: Secondary | ICD-10-CM | POA: Diagnosis not present

## 2018-04-02 NOTE — Progress Notes (Signed)
VIALS EXP 04-03-19

## 2018-04-18 ENCOUNTER — Ambulatory Visit (INDEPENDENT_AMBULATORY_CARE_PROVIDER_SITE_OTHER): Payer: PRIVATE HEALTH INSURANCE

## 2018-04-18 DIAGNOSIS — J309 Allergic rhinitis, unspecified: Secondary | ICD-10-CM

## 2018-05-15 ENCOUNTER — Ambulatory Visit (INDEPENDENT_AMBULATORY_CARE_PROVIDER_SITE_OTHER): Payer: PRIVATE HEALTH INSURANCE

## 2018-05-15 DIAGNOSIS — J309 Allergic rhinitis, unspecified: Secondary | ICD-10-CM

## 2018-06-26 ENCOUNTER — Ambulatory Visit (INDEPENDENT_AMBULATORY_CARE_PROVIDER_SITE_OTHER): Payer: PRIVATE HEALTH INSURANCE

## 2018-06-26 DIAGNOSIS — J309 Allergic rhinitis, unspecified: Secondary | ICD-10-CM

## 2018-07-07 ENCOUNTER — Other Ambulatory Visit: Payer: Self-pay | Admitting: Obstetrics & Gynecology

## 2018-07-10 ENCOUNTER — Ambulatory Visit (INDEPENDENT_AMBULATORY_CARE_PROVIDER_SITE_OTHER): Payer: PRIVATE HEALTH INSURANCE

## 2018-07-10 ENCOUNTER — Encounter: Payer: Self-pay | Admitting: Medical

## 2018-07-10 ENCOUNTER — Ambulatory Visit: Payer: 59 | Admitting: Medical

## 2018-07-10 ENCOUNTER — Ambulatory Visit (HOSPITAL_BASED_OUTPATIENT_CLINIC_OR_DEPARTMENT_OTHER)
Admission: RE | Admit: 2018-07-10 | Discharge: 2018-07-10 | Disposition: A | Discharge status: Home / Self Care | Payer: 59 | Source: Ambulatory Visit | Attending: Medical | Admitting: Medical

## 2018-07-10 VITALS — BP 114/76 | HR 98 | Temp 98.2°F | Ht 65.0 in | Wt 188.6 lb

## 2018-07-10 DIAGNOSIS — R7989 Other specified abnormal findings of blood chemistry: Secondary | ICD-10-CM | POA: Diagnosis not present

## 2018-07-10 DIAGNOSIS — M79609 Pain in unspecified limb: Secondary | ICD-10-CM

## 2018-07-10 DIAGNOSIS — M79662 Pain in left lower leg: Secondary | ICD-10-CM | POA: Insufficient documentation

## 2018-07-10 DIAGNOSIS — R252 Cramp and spasm: Secondary | ICD-10-CM

## 2018-07-10 DIAGNOSIS — G629 Polyneuropathy, unspecified: Secondary | ICD-10-CM

## 2018-07-10 DIAGNOSIS — J309 Allergic rhinitis, unspecified: Secondary | ICD-10-CM

## 2018-07-10 DIAGNOSIS — M2550 Pain in unspecified joint: Secondary | ICD-10-CM

## 2018-07-10 LAB — COMPREHENSIVE METABOLIC PANEL
ALT: 17 U/L (ref 0–35)
AST: 15 U/L (ref 0–37)
Albumin: 4.2 g/dL (ref 3.5–5.2)
Alkaline Phosphatase: 63 U/L (ref 39–117)
BUN: 21 mg/dL (ref 6–23)
CO2: 30 mEq/L (ref 19–32)
Calcium: 10 mg/dL (ref 8.4–10.5)
Chloride: 101 mEq/L (ref 96–112)
Creatinine, Ser: 0.96 mg/dL (ref 0.40–1.20)
GFR: 62.53 mL/min (ref >60.00)
Glucose, Bld: 89 mg/dL (ref 70–99)
Potassium: 4.7 mEq/L (ref 3.5–5.1)
Sodium: 138 mEq/L (ref 135–145)
Total Bilirubin: 0.5 mg/dL (ref 0.2–1.2)
Total Protein: 7.5 g/dL (ref 6.0–8.3)

## 2018-07-10 LAB — VITAMIN D 25 HYDROXY (VIT D DEFICIENCY, FRACTURES): VITD: 32.58 ng/mL (ref 30.00–100.00)

## 2018-07-10 LAB — VITAMIN B12: Vitamin B-12: 326 pg/mL (ref 211–911)

## 2018-07-10 LAB — MAGNESIUM: Magnesium: 2.1 mg/dL (ref 1.5–2.5)

## 2018-07-10 MED ORDER — CLARITHROMYCIN ER 500 MG PO TB24: 1000.0000 mg | ORAL_TABLET | Freq: Every day | ORAL | 0 refills | Status: DC

## 2018-07-10 NOTE — Telephone Encounter (Signed)
Called into pharmacy

## 2018-07-10 NOTE — Patient Instructions (Signed)
For your recent left calf pain and popliteal pain, I did place order for you to get left lower extremity ultrasound.  Please go downstairs and get that scheduled.  Also for your  Lower extremity cramping and numbness at times, I did place order for ABI.  That test will need to be scheduled out.  If no one calls you we then a week regarding that test please call us for update.  Or my chart me.  Also for neuropathy type symptoms, did place order for B12 and B1 vitamin.  For low vitamin D history, placed vitamin D lab.  For cramping, placed metabolic panel and magnesium level.  Continue current medications and if any signs or symptoms worsen or change please let us know.  For possible sinus infection, prescribe 5 more days of Biaxin to add to your recent 3 days you used over the weekend.  At your request added HLA-B 27 for your arthralgia history.   Follow-up in 10 to 14 days or as needed.

## 2018-07-10 NOTE — Progress Notes (Signed)
Subjective:    Patient ID: Melissa Middleton, female    DOB: April 09, 1956, 62 y.o.   MRN: [id number]  HPI  Pt in for evaluation.  She states her legs over past year of so her feet cold and some numbness at times to her toes. Numbness to toes at time occurs in left foot at times.Then states some numbness to rt toes but worse on left side.  Pt has some heaviness to legs. Dull ache to both legs. Mild pain in back of left  Leg/popliteal area. But some diffuse achiness both side.. Pt concerned since her since had recent dvt and PE. Her dad also had dvt.  Pt thinks some left calf cramping at times.  Incidental finding on mri of back showed herniated disk lower back but she denies radicular.     Review of Systems  Constitutional: Negative for chills, fatigue and fever.  HENT: Positive for congestion, sinus pressure and sinus pain.        Had symptoms on weekend. 3 days biaxin did help. Mild presure.  Respiratory: Negative for cough, chest tightness and wheezing.   Cardiovascular: Negative for chest pain and palpitations.  Gastrointestinal: Negative for abdominal pain.  Musculoskeletal: Positive for arthralgias.       See hpi as well.  Neurological: Negative for dizziness, syncope, weakness and headaches.  Hematological: Negative for adenopathy. Does not bruise/bleed easily.  Psychiatric/Behavioral: Negative for behavioral problems, confusion, self-injury, sleep disturbance and suicidal ideas. The patient is not nervous/anxious.     Past Medical History:  Diagnosis Date  . Allergic rhinitis   . Anxiety   . Asthma   . Benign neoplasm of stomach    gastric polyps  . Bronchitis    recurrent  . Diaphragmatic hernia without mention of obstruction or gangrene   . Diverticulosis of colon (without mention of hemorrhage)   . Esophageal reflux    see GI  . GERD (gastroesophageal reflux disease)   . Insomnia   . Osteoarthritis    sees ortho-has had steroid shot in knee  . Pneumonia   .  Post menopausal problems    symptoms  . Premenstrual tension syndromes    gyn uses alprazolam and spironolactone prn ofr treatment  . Psoriasis   . Psoriatic arthritis ([place])   . Sinusitis    recurrent  . Status post dilation of esophageal narrowing   . Stricture and stenosis of esophagus   . Unspecified asthma(493.90)   . Vitamin D deficiency      Social History   Socioeconomic History  . Marital status: Married    Spouse name: Not on file  . Number of children: 2  . Years of education: Not on file  . Highest education level: Not on file  Occupational History    Employer: UNEMPLOYED    Comment: Administrative work  Social Needs  . Financial resource strain: Not on file  . Food insecurity:    Worry: Not on file    Inability: Not on file  . Transportation needs:    Medical: Not on file    Non-medical: Not on file  Tobacco Use  . Smoking status: Never Smoker  . Smokeless tobacco: Never Used  Substance and Sexual Activity  . Alcohol use: No    Alcohol/week: 0.0 oz  . Drug use: No  . Sexual activity: Yes    Comment: INTERCOURSE AGE 44, SEXUAL PARTNERS LESS THAN 5  Lifestyle  . Physical activity:    Days per week: Not on  file    Minutes per session: Not on file  . Stress: Not on file  Relationships  . Social connections:    Talks on phone: Not on file    Gets together: Not on file    Attends religious service: Not on file    Active member of club or organization: Not on file    Attends meetings of clubs or organizations: Not on file    Relationship status: Not on file  . Intimate partner violence:    Fear of current or ex partner: Not on file    Emotionally abused: Not on file    Physically abused: Not on file    Forced sexual activity: Not on file  Other Topics Concern  . Not on file  Social History Narrative   Daughter age 36   Son age 55   Married   Enjoys Chief Executive Officer, [employer]arry manager shows    Past Surgical History:  Procedure Laterality Date  . CESAREAN SECTION     . ENDOMETRIAL ABLATION    . NASAL SINUS SURGERY    . TOTAL KNEE ARTHROPLASTY Right 11/2009  . TUBAL LIGATION  2004   with ablation    Family History  Problem Relation Age of Onset  . Hypertension Mother        died from an asthma attack  . Asthma Mother   . Diabetes Father        died from sepsis  . Hypertension Father   . Arthritis Father   . Ovarian cancer Sister   . Melanoma Sister   . Hypertension Maternal Grandfather   . Heart attack Maternal Grandfather   . Diabetes Paternal Grandmother   . Arthritis Paternal Grandmother   . Migraines Neg Hx     Allergies  Allergen Reactions  . Macrodantin [Nitrofurantoin Macrocrystal] Itching and Rash  . Cefuroxime Axetil Rash and Other (See Comments)    REACTION: red face/rash  . Penicillins Rash    Current Outpatient Medications on File Prior to Visit  Medication Sig Dispense Refill  . Adalimumab 40 MG/0.8ML PNKT Inject 40 mg into the skin.    Marland Kitchen albuterol (PROVENTIL HFA;VENTOLIN HFA) 108 (90 Base) MCG/ACT inhaler Inhale 2 puffs into the lungs every 4 (four) hours as needed for wheezing or shortness of breath. 8 g 1  . ALPRAZolam (XANAX) 0.25 MG tablet TAKE 1 TABLET AT BEDTIME AS NEEDED FOR ANXIETY 90 tablet 1  . ammonium lactate (AMLACTIN) 12 % cream APPLY TO AREA TWICE A DAY 140 g 1  . EPINEPHrine (EPIPEN 2-PAK) 0.3 mg/0.3 mL IJ SOAJ injection Use as directed for severe allergic reactions 2 Device 2  . esomeprazole (NEXIUM) 40 MG capsule Take 1 tablet by mouth once daily 90 capsule 1  . Estradiol 10 MCG TABS vaginal tablet Place 1 tablet (10 mcg total) 2 (two) times a week vaginally. 12 tablet 4  . fluticasone (FLONASE) 50 MCG/ACT nasal spray TWO SPRAYS EACH NOSTRIL ONCE A DAY FOR NASAL CONGESTION OR DRAINAGE. 16 g 5  . montelukast (SINGULAIR) 10 MG tablet Take 1 tablet (10 mg total) by mouth at bedtime. 90 tablet 1  . spironolactone (ALDACTONE) 25 MG tablet TAKE 1 TABLET (25 MG TOTAL) DAILY AS NEEDED BY MOUTH. 90 tablet 1    . traZODone (DESYREL) 100 MG tablet TAKE 1 TABLET BY MOUTH EVERYDAY AT BEDTIME 90 tablet 1  . triamcinolone cream (KENALOG) 0.1 % Apply 1 application topically 2 (two) times daily. 15 g 0   Current Facility-Administered Medications on  File Prior to Visit  Medication Dose Route Frequency Provider Last Rate Last Dose  . 0.9 %  sodium chloride infusion  500 mL Intravenous Once Danis, Estill Cotta III, MD        BP 114/76 (BP Location: Left Arm, Patient Position: Sitting, Cuff Size: Large)   Pulse 98   Temp 98.2 F (36.8 C) (Oral)   Ht _0  (1.651 m)   Wt 188 lb 9.6 oz (85.5 kg)   SpO2 98%   BMI 31.38 kg/m       Objective:   Physical Exam  General Mental Status- Alert. General Appearance- Not in acute distress.   Skin General: Color- Normal Color. Moisture- Normal Moisture.  Neck Carotid Arteries- Normal color. Moisture- Normal Moisture. No carotid bruits. No JVD.  Chest and Lung Exam Auscultation: Breath Sounds:-Normal.  Cardiovascular Auscultation:Rythm- Regular. Murmurs & Other Heart Sounds:Auscultation of the heart reveals- No Murmurs.  Abdomen Inspection:-Inspeection Normal. Palpation/Percussion:Note:No mass. Palpation and Percussion of the abdomen reveal- Non Tender, Non Distended + BS, no rebound or guarding.    Neurologic Cranial Nerve exam:- CN III-XII intact(No nystagmus), symmetric smile. Strength:- 5/5 equal and symmetric strength both upper and lower extremities.  Lower ext- faint fullness left politeal area. Left calf not swollen. Good capillary refills. No abnormal warmth or tenderness to feet.  Heent- mild sinus pressure on palpation.    Assessment & Plan:  For your recent left calf pain and popliteal pain, I did place order for you to get left lower extremity ultrasound.  Please go downstairs and get that scheduled.  Also for your  Lower extremity cramping and numbness at times, I did place order for ABI.  That test will need to be scheduled out.   If no one calls you we then a week regarding that test please call us for update.  Or my chart me.  Also for neuropathy type symptoms, did place order for B12 and B1 vitamin.  For low vitamin D history, placed vitamin D lab.  For cramping, placed metabolic panel and magnesium level.  Continue current medications and if any signs or symptoms worsen or change please let us know.  For possible sinus infection, prescribe 5 more days of Biaxin to add to your recent 3 days you used over the weekend.  At your request added HLA-B 27 for your arthralgia history.   Follow-up in 10 to 14 days or as needed.  40 minutes spent with pt. 50% of time spent counseling on her leg pain, popliteal pain, cramping, neuropathy, low vitamin d, significance of [id number] test and hx of low vitamin D.   Mackie Pai, PA-C

## 2018-07-11 LAB — HLA-B27 ANTIGEN: HLA-B27 Antigen: NEGATIVE

## 2018-07-12 ENCOUNTER — Telehealth: Payer: Self-pay | Admitting: Family

## 2018-07-12 NOTE — Telephone Encounter (Signed)
Patient calling to obtain lab results. Triage currently unavailable. Please advise.   Copied from [place] [id number]. Topic: Quick Communication - Lab Results >> Jul 12, 2018  8:41 AM Hinton Dyer, [place] wrote: Called patient to inform them of 07/11/2018 lab results. When patient returns call, triage nurse may disclose results.

## 2018-07-13 LAB — VITAMIN B1: Vitamin B1 (Thiamine): 70 nmol/L — ABNORMAL HIGH (ref 8–30)

## 2018-07-13 NOTE — Telephone Encounter (Signed)
Left message to call back regarding lab results.

## 2018-07-19 ENCOUNTER — Ambulatory Visit (INDEPENDENT_AMBULATORY_CARE_PROVIDER_SITE_OTHER): Payer: PRIVATE HEALTH INSURANCE | Admitting: *Deleted

## 2018-07-19 DIAGNOSIS — J309 Allergic rhinitis, unspecified: Secondary | ICD-10-CM

## 2018-07-20 ENCOUNTER — Encounter (HOSPITAL_BASED_OUTPATIENT_CLINIC_OR_DEPARTMENT_OTHER): Payer: 59

## 2018-07-20 ENCOUNTER — Ambulatory Visit (HOSPITAL_BASED_OUTPATIENT_CLINIC_OR_DEPARTMENT_OTHER)
Admission: RE | Admit: 2018-07-20 | Discharge: 2018-07-20 | Disposition: A | Discharge status: Home / Self Care | Payer: 59 | Source: Ambulatory Visit | Attending: Medical | Admitting: Medical

## 2018-07-20 DIAGNOSIS — G629 Polyneuropathy, unspecified: Secondary | ICD-10-CM | POA: Insufficient documentation

## 2018-07-20 DIAGNOSIS — R252 Cramp and spasm: Secondary | ICD-10-CM | POA: Insufficient documentation

## 2018-07-20 NOTE — Progress Notes (Signed)
VAS [place] ABI WITH TBI PERFORMED   Resting right ankle-brachial index is within normal range. No evidence of significant right lower extremity arterial disease. The right toe-brachial index is normal.    Resting left ankle-brachial index is within normal range. No evidence of significant left lower extremity arterial disease. The left toe-brachial index is normal.   07/20/18  Cardell Peach RDCS, RVT

## 2018-07-30 ENCOUNTER — Ambulatory Visit (INDEPENDENT_AMBULATORY_CARE_PROVIDER_SITE_OTHER): Payer: PRIVATE HEALTH INSURANCE | Admitting: *Deleted

## 2018-07-30 DIAGNOSIS — J309 Allergic rhinitis, unspecified: Secondary | ICD-10-CM

## 2018-08-08 ENCOUNTER — Ambulatory Visit (INDEPENDENT_AMBULATORY_CARE_PROVIDER_SITE_OTHER): Payer: PRIVATE HEALTH INSURANCE | Admitting: *Deleted

## 2018-08-08 DIAGNOSIS — J309 Allergic rhinitis, unspecified: Secondary | ICD-10-CM

## 2018-08-29 ENCOUNTER — Telehealth: Payer: Self-pay | Admitting: Family

## 2018-08-29 NOTE — Telephone Encounter (Signed)
Received request for Trazodone. Refill sent. Pt is due for physical on 08/30/18. Please call pt to schedule appt with Melissa soon. Thanks!

## 2018-09-03 ENCOUNTER — Other Ambulatory Visit: Payer: Self-pay | Admitting: Women's Health

## 2018-09-03 DIAGNOSIS — Z1231 Encounter for screening mammogram for malignant neoplasm of breast: Secondary | ICD-10-CM

## 2018-09-03 NOTE — Telephone Encounter (Signed)
Called and spoke to pt about setting up a CPE. Pt stated that she would call back to make her appt once she knows her schedule.

## 2018-09-06 ENCOUNTER — Ambulatory Visit (INDEPENDENT_AMBULATORY_CARE_PROVIDER_SITE_OTHER): Payer: PRIVATE HEALTH INSURANCE

## 2018-09-06 DIAGNOSIS — J309 Allergic rhinitis, unspecified: Secondary | ICD-10-CM | POA: Diagnosis not present

## 2018-09-15 ENCOUNTER — Other Ambulatory Visit: Payer: Self-pay | Admitting: Family

## 2018-09-27 ENCOUNTER — Ambulatory Visit (HOSPITAL_BASED_OUTPATIENT_CLINIC_OR_DEPARTMENT_OTHER)
Admission: RE | Admit: 2018-09-27 | Discharge: 2018-09-27 | Disposition: A | Discharge status: Home / Self Care | Payer: 59 | Source: Ambulatory Visit | Attending: Women's Health | Admitting: Women's Health

## 2018-09-27 DIAGNOSIS — Z1231 Encounter for screening mammogram for malignant neoplasm of breast: Secondary | ICD-10-CM | POA: Diagnosis present

## 2018-10-02 ENCOUNTER — Other Ambulatory Visit: Payer: Self-pay | Admitting: Women's Health

## 2018-10-02 NOTE — Telephone Encounter (Signed)
Annual scheduled on 10/30/18

## 2018-10-04 ENCOUNTER — Ambulatory Visit (INDEPENDENT_AMBULATORY_CARE_PROVIDER_SITE_OTHER): Payer: PRIVATE HEALTH INSURANCE

## 2018-10-04 DIAGNOSIS — J309 Allergic rhinitis, unspecified: Secondary | ICD-10-CM | POA: Diagnosis not present

## 2018-10-08 NOTE — Progress Notes (Signed)
EXP 10/09/19 

## 2018-10-09 DIAGNOSIS — J3089 Other allergic rhinitis: Secondary | ICD-10-CM | POA: Diagnosis not present

## 2018-10-21 ENCOUNTER — Telehealth: Payer: Self-pay | Admitting: Family

## 2018-10-22 NOTE — Telephone Encounter (Signed)
90 day supply of Singulair sent to pharmacy. Pt last seen by PCP 02/2018 and advised CPE in 6 months (08/2018). Pt is past due for appointment. Please call pt to schedule CPE soon as further refills may not be given until she is seen in the office. Thanks!

## 2018-10-23 NOTE — Telephone Encounter (Signed)
Left message for pt to call us back to schedule physical appt.

## 2018-10-23 NOTE — Telephone Encounter (Signed)
Please schedule

## 2018-10-30 ENCOUNTER — Ambulatory Visit (INDEPENDENT_AMBULATORY_CARE_PROVIDER_SITE_OTHER): Payer: PRIVATE HEALTH INSURANCE | Admitting: Women's Health

## 2018-10-30 ENCOUNTER — Encounter: Payer: Self-pay | Admitting: Women's Health

## 2018-10-30 VITALS — BP 122/80 | Ht 65.0 in | Wt 189.0 lb

## 2018-10-30 DIAGNOSIS — Z01419 Encounter for gynecological examination (general) (routine) without abnormal findings: Secondary | ICD-10-CM

## 2018-10-30 MED ORDER — SPIRONOLACTONE 25 MG PO TABS: 25.0000 mg | ORAL_TABLET | Freq: Every day | ORAL | 1 refills | Status: DC | PRN

## 2018-10-30 MED ORDER — ALPRAZOLAM 0.25 MG PO TABS: ORAL_TABLET | ORAL | 0 refills | Status: DC

## 2018-10-30 NOTE — Progress Notes (Signed)
Melissa Middleton [birth year] [id number]    History:    Presents for annual exam.  Postmenopausal with no bleeding using Vagifem occasionally, husband ED.  Normal Pap and mammogram history.  Sister with ovarian cancer survivor diagnosed at age 62, currently struggling with mental illness.  Primary care manages psoriatic arthritis on Humira, asthma.  2017 normal DEXA.  2019 normal colonoscopy.  Uses an occasional Xanax and Aldactone.  Past medical history, past surgical history, family history and social history were all reviewed and documented in the EPIC chart.  Retired.  2 children, daughter 103 doing mammograms, son 43 senior in high school both have psoriasis.  Neither  had Gardasil.  ROS:  A ROS was performed and pertinent positives and negatives are included.  Exam:  Vitals:   10/30/18 1401  BP: 122/80  Weight: 189 lb (85.7 kg)  Height: 5\' 5"  (1.651 m)   Body mass index is 31.45 kg/m.   General appearance:  Normal Thyroid:  Symmetrical, normal in size, without palpable masses or nodularity. Respiratory  Auscultation:  Clear without wheezing or rhonchi Cardiovascular  Auscultation:  Regular rate, without rubs, murmurs or gallops  Edema/varicosities:  Not grossly evident Abdominal  Soft,nontender, without masses, guarding or rebound.  Liver/spleen:  No organomegaly noted  Hernia:  None appreciated  Skin  Inspection:  Grossly normal   Breasts: Examined lying and sitting.     Right: Without masses, retractions, discharge or axillary adenopathy.     Left: Without masses, retractions, discharge or axillary adenopathy. Gentitourinary   Inguinal/mons:  Normal without inguinal adenopathy  External genitalia:  Normal  BUS/Urethra/Skene's glands:  Normal  Vagina: Mild atrophy  Cervix:  Normal  Uterus:  normal in size, shape and contour.  Midline and mobile  Adnexa/parametria:     Rt: Without masses or tenderness.   Lt: Without masses or tenderness.  Anus and  perineum: Normal  Digital rectal exam: Normal sphincter tone without palpated masses or tenderness  Assessment/Plan:  62 y.o.MWF G2P2  for annual exam with no GYN complaints.  Postmenopausal/no bleeding Vagifem occasionally Psoriatic arthritis-primary care manages labs and meds Asthma  Plan: Vagifem reviewed minimal systemic effects, denies need for refill states will start using more on a regular basis reports it did help with dryness.  SBE's, continue annual screening mammogram, calcium rich foods, vitamin D 2000 daily encouraged.  Shingrex vaccine discussed will review with primary care.  Home safety, fall prevention and importance of weightbearing and balance type exercise reviewed and encouraged.  Xanax 0.25 mg as needed, reviewed addictive properties and to use sparingly.  Pap with HR HPV typing, new screening guidelines reviewed.   [place], 2:22 PM 10/30/2018

## 2018-10-30 NOTE — Patient Instructions (Addendum)
shingrex vaccine  Health Maintenance for Postmenopausal Women Menopause is a normal process in which your reproductive ability comes to an end. This process happens gradually over a span of months to years, usually between the ages of 81 and 53. Menopause is complete when you have missed 12 consecutive menstrual periods. It is important to talk with your health care provider about some of the most common conditions that affect postmenopausal women, such as heart disease, cancer, and bone loss (osteoporosis). Adopting a healthy lifestyle and getting preventive care can help to promote your health and wellness. Those actions can also lower your chances of developing some of these common conditions. What should I know about menopause? During menopause, you may experience a number of symptoms, such as:  Moderate-to-severe hot flashes.  Night sweats.  Decrease in sex drive.  Mood swings.  Headaches.  Tiredness.  Irritability.  Memory problems.  Insomnia.  Choosing to treat or not to treat menopausal changes is an individual decision that you make with your health care provider. What should I know about hormone replacement therapy and supplements? Hormone therapy products are effective for treating symptoms that are associated with menopause, such as hot flashes and night sweats. Hormone replacement carries certain risks, especially as you become older. If you are thinking about using estrogen or estrogen with progestin treatments, discuss the benefits and risks with your health care provider. What should I know about heart disease and stroke? Heart disease, heart attack, and stroke become more likely as you age. This may be due, in part, to the hormonal changes that your body experiences during menopause. These can affect how your body processes dietary fats, triglycerides, and cholesterol. Heart attack and stroke are both medical emergencies. There are many things that you can do to help  prevent heart disease and stroke:  Have your blood pressure checked at least every 1-2 years. High blood pressure causes heart disease and increases the risk of stroke.  If you are 5-49 years old, ask your health care provider if you should take aspirin to prevent a heart attack or a stroke.  Do not use any tobacco products, including cigarettes, chewing tobacco, or electronic cigarettes. If you need help quitting, ask your health care provider.  It is important to eat a healthy diet and maintain a healthy weight. ? Be sure to include plenty of vegetables, fruits, low-fat dairy products, and lean protein. ? Avoid eating foods that are high in solid fats, added sugars, or salt (sodium).  Get regular exercise. This is one of the most important things that you can do for your health. ? Try to exercise for at least 150 minutes each week. The type of exercise that you do should increase your heart rate and make you sweat. This is known as moderate-intensity exercise. ? Try to do strengthening exercises at least twice each week. Do these in addition to the moderate-intensity exercise.  Know your numbers.Ask your health care provider to check your cholesterol and your blood glucose. Continue to have your blood tested as directed by your health care provider.  What should I know about cancer screening? There are several types of cancer. Take the following steps to reduce your risk and to catch any cancer development as early as possible. Breast Cancer  Practice breast self-awareness. ? This means understanding how your breasts normally appear and feel. ? It also means doing regular breast self-exams. Let your health care provider know about any changes, no matter how small.  If  you are 52 or older, have a clinician do a breast exam (clinical breast exam or CBE) every year. Depending on your age, family history, and medical history, it may be recommended that you also have a yearly breast X-ray  (mammogram).  If you have a family history of breast cancer, talk with your health care provider about genetic screening.  If you are at high risk for breast cancer, talk with your health care provider about having an MRI and a mammogram every year.  Breast cancer (BRCA) gene test is recommended for women who have family members with BRCA-related cancers. Results of the assessment will determine the need for genetic counseling and BRCA1 and for BRCA2 testing. BRCA-related cancers include these types: ? Breast. This occurs in males or females. ? Ovarian. ? Tubal. This may also be called fallopian tube cancer. ? Cancer of the abdominal or pelvic lining (peritoneal cancer). ? Prostate. ? Pancreatic.  Cervical, Uterine, and Ovarian Cancer Your health care provider may recommend that you be screened regularly for cancer of the pelvic organs. These include your ovaries, uterus, and vagina. This screening involves a pelvic exam, which includes checking for microscopic changes to the surface of your cervix (Pap test).  For women ages 21-65, health care providers may recommend a pelvic exam and a Pap test every three years. For women ages 68-65, they may recommend the Pap test and pelvic exam, combined with testing for human papilloma virus (HPV), every five years. Some types of HPV increase your risk of cervical cancer. Testing for HPV may also be done on women of any age who have unclear Pap test results.  Other health care providers may not recommend any screening for nonpregnant women who are considered low risk for pelvic cancer and have no symptoms. Ask your health care provider if a screening pelvic exam is right for you.  If you have had past treatment for cervical cancer or a condition that could lead to cancer, you need Pap tests and screening for cancer for at least 20 years after your treatment. If Pap tests have been discontinued for you, your risk factors (such as having a new sexual  partner) need to be reassessed to determine if you should start having screenings again. Some women have medical problems that increase the chance of getting cervical cancer. In these cases, your health care provider may recommend that you have screening and Pap tests more often.  If you have a family history of uterine cancer or ovarian cancer, talk with your health care provider about genetic screening.  If you have vaginal bleeding after reaching menopause, tell your health care provider.  There are currently no reliable tests available to screen for ovarian cancer.  Lung Cancer Lung cancer screening is recommended for adults 49-76 years old who are at high risk for lung cancer because of a history of smoking. A yearly low-dose CT scan of the lungs is recommended if you:  Currently smoke.  Have a history of at least 30 pack-years of smoking and you currently smoke or have quit within the past 15 years. A pack-year is smoking an average of one pack of cigarettes per day for one year.  Yearly screening should:  Continue until it has been 15 years since you quit.  Stop if you develop a health problem that would prevent you from having lung cancer treatment.  Colorectal Cancer  This type of cancer can be detected and can often be prevented.  Routine colorectal cancer screening  usually begins at age 77 and continues through age 62.  If you have risk factors for colon cancer, your health care provider may recommend that you be screened at an earlier age.  If you have a family history of colorectal cancer, talk with your health care provider about genetic screening.  Your health care provider may also recommend using home test kits to check for hidden blood in your stool.  A small camera at the end of a tube can be used to examine your colon directly (sigmoidoscopy or colonoscopy). This is done to check for the earliest forms of colorectal cancer.  Direct examination of the colon  should be repeated every 5-10 years until age 80. However, if early forms of precancerous polyps or small growths are found or if you have a family history or genetic risk for colorectal cancer, you may need to be screened more often.  Skin Cancer  Check your skin from head to toe regularly.  Monitor any moles. Be sure to tell your health care provider: ? About any new moles or changes in moles, especially if there is a change in a mole's shape or color. ? If you have a mole that is larger than the size of a pencil eraser.  If any of your family members has a history of skin cancer, especially at a [place] age, talk with your health care provider about genetic screening.  Always use sunscreen. Apply sunscreen liberally and repeatedly throughout the day.  Whenever you are outside, protect yourself by wearing long sleeves, pants, a wide-brimmed hat, and sunglasses.  What should I know about osteoporosis? Osteoporosis is a condition in which bone destruction happens more quickly than new bone creation. After menopause, you may be at an increased risk for osteoporosis. To help prevent osteoporosis or the bone fractures that can happen because of osteoporosis, the following is recommended:  If you are 76-25 years old, get at least 1,000 mg of calcium and at least 600 mg of vitamin D per day.  If you are older than age 5 but younger than age 77, get at least 1,200 mg of calcium and at least 600 mg of vitamin D per day.  If you are older than age 71, get at least 1,200 mg of calcium and at least 800 mg of vitamin D per day.  Smoking and excessive alcohol intake increase the risk of osteoporosis. Eat foods that are rich in calcium and vitamin D, and do weight-bearing exercises several times each week as directed by your health care provider. What should I know about how menopause affects my mental health? Depression may occur at any age, but it is more common as you become older. Common symptoms of  depression include:  Low or sad mood.  Changes in sleep patterns.  Changes in appetite or eating patterns.  Feeling an overall lack of motivation or enjoyment of activities that you previously enjoyed.  Frequent crying spells.  Talk with your health care provider if you think that you are experiencing depression. What should I know about immunizations? It is important that you get and maintain your immunizations. These include:  Tetanus, diphtheria, and pertussis (Tdap) booster vaccine.  Influenza every year before the flu season begins.  Pneumonia vaccine.  Shingles vaccine.  Your health care provider may also recommend other immunizations. This information is not intended to replace advice given to you by your health care provider. Make sure you discuss any questions you have with your health care provider.  Document Released: 01/27/2006 Document Revised: 06/24/2016 Document Reviewed: 09/08/2015 Elsevier Interactive Patient Education  2018 [employer].

## 2018-10-30 NOTE — Addendum Note (Signed)
Addended by: Lorine Bears on: 10/30/2018 03:14 PM   Modules accepted: Orders

## 2018-11-01 ENCOUNTER — Ambulatory Visit (INDEPENDENT_AMBULATORY_CARE_PROVIDER_SITE_OTHER): Payer: PRIVATE HEALTH INSURANCE

## 2018-11-01 DIAGNOSIS — J309 Allergic rhinitis, unspecified: Secondary | ICD-10-CM | POA: Diagnosis not present

## 2018-11-01 LAB — URINE CULTURE
MICRO NUMBER:: 91366821
Result:: NO GROWTH
SPECIMEN QUALITY:: ADEQUATE

## 2018-11-01 LAB — URINALYSIS, COMPLETE W/RFL CULTURE
Bacteria, UA: NONE SEEN /HPF
Bilirubin Urine: NEGATIVE
Glucose, UA: NEGATIVE
Hgb urine dipstick: NEGATIVE
Hyaline Cast: NONE SEEN /LPF
Nitrites, Initial: NEGATIVE
Specific Gravity, Urine: 1.029 (ref 1.001–1.03)
pH: 6 (ref 5.0–8.0)

## 2018-11-01 LAB — PAP, TP IMAGING W/ HPV RNA, RFLX HPV TYPE 16,18/45: HPV DNA High Risk: NOT DETECTED

## 2018-11-01 LAB — CULTURE INDICATED

## 2018-11-12 ENCOUNTER — Encounter: Payer: Self-pay | Admitting: Family

## 2018-11-12 ENCOUNTER — Ambulatory Visit (INDEPENDENT_AMBULATORY_CARE_PROVIDER_SITE_OTHER): Payer: 59 | Admitting: Family

## 2018-11-12 VITALS — BP 127/83 | HR 86 | Temp 98.5°F | Resp 16 | Ht 65.0 in | Wt 189.0 lb

## 2018-11-12 DIAGNOSIS — Z0000 Encounter for general adult medical examination without abnormal findings: Secondary | ICD-10-CM | POA: Diagnosis not present

## 2018-11-12 MED ORDER — EPINEPHRINE 0.3 MG/0.3ML IJ SOAJ: INTRAMUSCULAR | 2 refills | Status: AC

## 2018-11-12 NOTE — Progress Notes (Signed)
Subjective:    Patient ID: Melissa Middleton, female    DOB: 13-Jan-1956, 62 y.o.   MRN: [id number]  HPI  Patient presents today for complete physical.  Immunizations: humira.  Diet: fair Exercise: none Wt Readings from Last 3 Encounters:  11/12/18 189 lb (85.7 kg)  10/30/18 189 lb (85.7 kg)  07/10/18 188 lb 9.6 oz (85.5 kg)  Colonoscopy:01/10/18 Dexa:10/17 Pap Smear:10/30/18 Mammogram: 09/27/18 Dental: up to date Vision: up to date     Review of Systems  Constitutional: Negative for unexpected weight change.  HENT: Negative for rhinorrhea.        Some issues hearing out of the left ear until last week when she went to an ENT.    Eyes: Negative for visual disturbance.  Respiratory: Negative for cough.   Cardiovascular: Negative for leg swelling.  Gastrointestinal: Negative for constipation and diarrhea.  Genitourinary: Negative for dysuria and frequency.  Musculoskeletal:       Reports improvement in joint pain on humira  Skin: Negative for rash.  Neurological: Negative for headaches.  Hematological: Negative for adenopathy.  Psychiatric/Behavioral:       Denies depression/anxiety   Past Medical History:  Diagnosis Date  . Allergic rhinitis   . Anxiety   . Asthma   . Benign neoplasm of stomach    gastric polyps  . Bronchitis    recurrent  . Diaphragmatic hernia without mention of obstruction or gangrene   . Diverticulosis of colon (without mention of hemorrhage)   . Esophageal reflux    see GI  . GERD (gastroesophageal reflux disease)   . Insomnia   . Osteoarthritis    sees ortho-has had steroid shot in knee  . Pneumonia   . Post menopausal problems    symptoms  . Premenstrual tension syndromes    gyn uses alprazolam and spironolactone prn ofr treatment  . Psoriasis   . Psoriatic arthritis ([place])   . Sinusitis    recurrent  . Status post dilation of esophageal narrowing   . Stricture and stenosis of esophagus   . Unspecified asthma(493.90)   .  Vitamin D deficiency      Social History   Socioeconomic History  . Marital status: Married    Spouse name: Not on file  . Number of children: 2  . Years of education: Not on file  . Highest education level: Not on file  Occupational History    Employer: UNEMPLOYED    Comment: Administrative work  Social Needs  . Financial resource strain: Not on file  . Food insecurity:    Worry: Not on file    Inability: Not on file  . Transportation needs:    Medical: Not on file    Non-medical: Not on file  Tobacco Use  . Smoking status: Never Smoker  . Smokeless tobacco: Never Used  Substance and Sexual Activity  . Alcohol use: No    Alcohol/week: 0.0 standard drinks  . Drug use: No  . Sexual activity: Yes    Comment: INTERCOURSE AGE 14, SEXUAL PARTNERS LESS THAN 5  Lifestyle  . Physical activity:    Days per week: Not on file    Minutes per session: Not on file  . Stress: Not on file  Relationships  . Social connections:    Talks on phone: Not on file    Gets together: Not on file    Attends religious service: Not on file    Active member of club or organization: Not on file  Attends meetings of clubs or organizations: Not on file    Relationship status: Not on file  . Intimate partner violence:    Fear of current or ex partner: Not on file    Emotionally abused: Not on file    Physically abused: Not on file    Forced sexual activity: Not on file  Other Topics Concern  . Not on file  Social History Narrative   Daughter age 50   Son age 39   Married   Enjoys Chief Executive Officer, Quarry manager shows    Past Surgical History:  Procedure Laterality Date  . CESAREAN SECTION    . ENDOMETRIAL ABLATION    . NASAL SINUS SURGERY    . TOTAL KNEE ARTHROPLASTY Right 11/2009  . TUBAL LIGATION  2004   with ablation    Family History  Problem Relation Age of Onset  . Hypertension Mother        died from an asthma attack  . Asthma Mother   . Diabetes Father        died from sepsis  .  Hypertension Father   . Arthritis Father   . Ovarian cancer Sister   . Melanoma Sister   . Hypertension Maternal Grandfather   . Heart attack Maternal Grandfather   . Diabetes Paternal Grandmother   . Arthritis Paternal Grandmother   . Migraines Neg Hx     Allergies  Allergen Reactions  . Macrodantin [Nitrofurantoin Macrocrystal] Itching and Rash  . Shellfish Allergy   . Cefuroxime Axetil Rash and Other (See Comments)    REACTION: red face/rash  . Penicillins Rash    Current Outpatient Medications on File Prior to Visit  Medication Sig Dispense Refill  . Adalimumab 40 MG/0.8ML PNKT Inject 40 mg into the skin.    Marland Kitchen albuterol (PROVENTIL HFA;VENTOLIN HFA) 108 (90 Base) MCG/ACT inhaler Inhale 2 puffs into the lungs every 4 (four) hours as needed for wheezing or shortness of breath. 8 g 1  . ALPRAZolam (XANAX) 0.25 MG tablet TAKE 1 TABLET BY MOUTH AT BEDTIME AS NEEDED FOR ANXIETY 90 tablet 0  . esomeprazole (NEXIUM) 40 MG capsule Take 1 capsule (40 mg total) by mouth daily. 90 capsule 3  . Estradiol 10 MCG TABS vaginal tablet Place 1 tablet (10 mcg total) 2 (two) times a week vaginally. 12 tablet 4  . fluticasone (FLONASE) 50 MCG/ACT nasal spray TWO SPRAYS EACH NOSTRIL ONCE A DAY FOR NASAL CONGESTION OR DRAINAGE. 16 g 5  . montelukast (SINGULAIR) 10 MG tablet TAKE 1 TABLET BY MOUTH EVERYDAY AT BEDTIME 90 tablet 0  . spironolactone (ALDACTONE) 25 MG tablet Take 1 tablet (25 mg total) by mouth daily as needed. 90 tablet 1  . traZODone (DESYREL) 100 MG tablet TAKE 1 TABLET BY MOUTH EVERYDAY AT BEDTIME 90 tablet 0  . triamcinolone cream (KENALOG) 0.1 % Apply 1 application topically 2 (two) times daily. 15 g 0   Current Facility-Administered Medications on File Prior to Visit  Medication Dose Route Frequency Provider Last Rate Last Dose  . 0.9 %  sodium chloride infusion  500 mL Intravenous Once [name], MD        BP 127/83 (BP Location: Right Arm, Patient Position: Sitting,  Cuff Size: Small)   Pulse 86   Temp 98.5 F (36.9 C) (Oral)   Resp 16   Ht 5\' 5"  (1.651 m)   Wt 189 lb (85.7 kg)   SpO2 99%   BMI 31.45 kg/m  Objective:   Physical Exam  Physical Exam  Constitutional: She is oriented to person, place, and time. She appears well-developed and well-nourished. No distress.  HENT:  Head: Normocephalic and atraumatic.  Right Ear: Tympanic membrane and ear canal normal.  Left Ear: Tympanic membrane and ear canal normal.  Mouth/Throat: Oropharynx is clear and moist.  Eyes: Pupils are equal, round, and reactive to light. No scleral icterus.  Neck: Normal range of motion. No thyromegaly present.  Cardiovascular: Normal rate and regular rhythm.   No murmur heard. Pulmonary/Chest: Effort normal and breath sounds normal. No respiratory distress. He has no wheezes. She has no rales. She exhibits no tenderness.  Abdominal: Soft. Bowel sounds are normal. She exhibits no distension and no mass. There is no tenderness. There is no rebound and no guarding.  Musculoskeletal: She exhibits no edema.  Lymphadenopathy:    She has no cervical adenopathy.  Neurological: She is alert and oriented to person, place, and time. She has normal patellar reflexes. She exhibits normal muscle tone. Coordination normal.  Skin: Skin is warm and dry.  Psychiatric: She has a normal mood and affect. Her behavior is normal. Judgment and thought content normal.  Breast/pelvic: deferred          Assessment & Plan:   Preventative care- discussed healthy diet, exercise, weight loss.  Offered shingrix and Tdap today. She declines. Colo/pap/mammo up to date.  Obtain routine lab work. EKG tracing is personally reviewed.  EKG notes NSR.  No acute changes.        Assessment & Plan:

## 2018-11-12 NOTE — Patient Instructions (Signed)
Please complete lab work prior to leaving. Work on [employer], regular exercise and weight loss.

## 2018-11-13 ENCOUNTER — Telehealth: Payer: Self-pay

## 2018-11-13 ENCOUNTER — Other Ambulatory Visit: Payer: Self-pay | Admitting: Women's Health

## 2018-11-13 LAB — CBC WITH DIFFERENTIAL/PLATELET
Basophils Absolute: 0.1 10*3/uL (ref 0.0–0.1)
Basophils Relative: 0.9 % (ref 0.0–3.0)
Eosinophils Absolute: 0.3 10*3/uL (ref 0.0–0.7)
Eosinophils Relative: 4.1 % (ref 0.0–5.0)
HCT: 40.1 % (ref 36.0–46.0)
Hemoglobin: 13 g/dL (ref 12.0–15.0)
Lymphocytes Relative: 27.1 % (ref 12.0–46.0)
Lymphs Abs: 1.8 10*3/uL (ref 0.7–4.0)
MCHC: 32.3 g/dL (ref 30.0–36.0)
MCV: 86.5 fl (ref 78.0–100.0)
Monocytes Absolute: 0.8 10*3/uL (ref 0.1–1.0)
Monocytes Relative: 12 % (ref 3.0–12.0)
Neutro Abs: 3.7 10*3/uL (ref 1.4–7.7)
Neutrophils Relative %: 55.9 % (ref 43.0–77.0)
Platelets: 308 10*3/uL (ref 150.0–400.0)
RBC: 4.64 Mil/uL (ref 3.87–5.11)
RDW: 13.7 % (ref 11.5–15.5)
WBC: 6.6 10*3/uL (ref 4.0–10.5)

## 2018-11-13 LAB — HEPATIC FUNCTION PANEL
ALT: 15 U/L (ref 0–35)
AST: 16 U/L (ref 0–37)
Albumin: 4 g/dL (ref 3.5–5.2)
Alkaline Phosphatase: 62 U/L (ref 39–117)
Bilirubin, Direct: 0.1 mg/dL (ref 0.0–0.3)
Total Bilirubin: 0.4 mg/dL (ref 0.2–1.2)
Total Protein: 7 g/dL (ref 6.0–8.3)

## 2018-11-13 LAB — LIPID PANEL
Cholesterol: 203 mg/dL — ABNORMAL HIGH (ref 0–200)
HDL: 50.1 mg/dL (ref >39.00)
NonHDL: 152.9
Total CHOL/HDL Ratio: 4
Triglycerides: 240 mg/dL — ABNORMAL HIGH (ref 0.0–149.0)
VLDL: 48 mg/dL — ABNORMAL HIGH (ref 0.0–40.0)

## 2018-11-13 LAB — URINALYSIS, ROUTINE W REFLEX MICROSCOPIC
Bilirubin Urine: NEGATIVE
Hgb urine dipstick: NEGATIVE
Ketones, ur: NEGATIVE
Leukocytes, UA: NEGATIVE
Nitrite: NEGATIVE
RBC / HPF: NONE SEEN (ref 0–?)
Specific Gravity, Urine: 1.01 (ref 1.000–1.030)
Total Protein, Urine: NEGATIVE
Urine Glucose: NEGATIVE
Urobilinogen, UA: 0.2 (ref 0.0–1.0)
pH: 7 (ref 5.0–8.0)

## 2018-11-13 LAB — BASIC METABOLIC PANEL
BUN: 13 mg/dL (ref 6–23)
CO2: 30 mEq/L (ref 19–32)
Calcium: 9.8 mg/dL (ref 8.4–10.5)
Chloride: 101 mEq/L (ref 96–112)
Creatinine, Ser: 0.8 mg/dL (ref 0.40–1.20)
GFR: 77.08 mL/min (ref >60.00)
Glucose, Bld: 99 mg/dL (ref 70–99)
Potassium: 4.2 mEq/L (ref 3.5–5.1)
Sodium: 139 mEq/L (ref 135–145)

## 2018-11-13 LAB — LDL CHOLESTEROL, DIRECT: Direct LDL: 122 mg/dL

## 2018-11-13 LAB — TSH: TSH: 1.2 u[IU]/mL (ref 0.35–4.50)

## 2018-11-13 MED ORDER — ALPRAZOLAM 0.25 MG PO TABS: ORAL_TABLET | ORAL | 0 refills | Status: DC

## 2018-11-13 NOTE — Telephone Encounter (Signed)
Patient cannot fill her Rx for Xanax 0.25 at her pharmacy because it is on back order. They were discussing the higher dose and her breaking them but she declined that. THey called around for her and found them at [facility] in [facility] and she called today asking me to call the Rx in there.    Rx sent to new pharmacy and cancelled at old pharmacy.

## 2018-11-16 ENCOUNTER — Encounter: Payer: Self-pay | Admitting: Family

## 2018-12-04 ENCOUNTER — Ambulatory Visit (INDEPENDENT_AMBULATORY_CARE_PROVIDER_SITE_OTHER): Payer: PRIVATE HEALTH INSURANCE

## 2018-12-04 DIAGNOSIS — J309 Allergic rhinitis, unspecified: Secondary | ICD-10-CM | POA: Diagnosis not present

## 2018-12-13 ENCOUNTER — Other Ambulatory Visit: Payer: Self-pay | Admitting: Family

## 2018-12-25 ENCOUNTER — Ambulatory Visit (INDEPENDENT_AMBULATORY_CARE_PROVIDER_SITE_OTHER): Payer: PRIVATE HEALTH INSURANCE | Admitting: Pediatrics

## 2018-12-25 ENCOUNTER — Encounter: Payer: Self-pay | Admitting: Pediatrics

## 2018-12-25 VITALS — BP 134/82 | HR 90 | Temp 97.9°F | Resp 20 | Ht 65.2 in | Wt 188.6 lb

## 2018-12-25 DIAGNOSIS — T7800XD Anaphylactic reaction due to unspecified food, subsequent encounter: Secondary | ICD-10-CM

## 2018-12-25 DIAGNOSIS — J4530 Mild persistent asthma, uncomplicated: Secondary | ICD-10-CM

## 2018-12-25 DIAGNOSIS — R21 Rash and other nonspecific skin eruption: Secondary | ICD-10-CM | POA: Diagnosis not present

## 2018-12-25 DIAGNOSIS — J3089 Other allergic rhinitis: Secondary | ICD-10-CM

## 2018-12-25 DIAGNOSIS — L4050 Arthropathic psoriasis, unspecified: Secondary | ICD-10-CM | POA: Diagnosis not present

## 2018-12-25 MED ORDER — TRIAMCINOLONE ACETONIDE 0.1 % EX CREA: 1.0000 "application " | TOPICAL_CREAM | Freq: Two times a day (BID) | CUTANEOUS | 5 refills | Status: DC

## 2018-12-25 MED ORDER — METHYLPREDNISOLONE ACETATE 40 MG/ML IJ SUSP
40.0000 mg | Freq: Once | INTRAMUSCULAR | Status: AC
  Administered 2018-12-25: 40 mg via INTRAMUSCULAR

## 2018-12-25 MED ORDER — ALBUTEROL SULFATE HFA 108 (90 BASE) MCG/ACT IN AERS: 2.0000 | INHALATION_SPRAY | RESPIRATORY_TRACT | 2 refills | Status: DC | PRN

## 2018-12-25 NOTE — Addendum Note (Signed)
Addended by: Prince Solian A on: 12/25/2018 01:47 PM   Modules accepted: Level of Service

## 2018-12-25 NOTE — Progress Notes (Addendum)
[address] [place] [address] Dept: [phone]  FOLLOW UP NOTE  Patient ID: Melissa Middleton, female    DOB: 1956-04-09  Age: 63 y.o. MRN: [id number] Date of Office Visit: 12/25/2018  Assessment  Chief Complaint: Rash  HPI SAMINA WEEKES presents for evaluation of a rash which began around Christmas time.  The rash has been itchy and she has been using Benadryl 25 mg twice a day.  She continues on Humira which she has taken for about a year and a half.  She is on allergy injections every 4 weeks.  Her asthma and nasal symptoms are well controlled.  She continues to avoid peanuts, cashew and shellfish.  Her rheumatologist started her on prednisone 30, 30, 20, 20 per day.  She has had slight improvement from prednisone Her last dose was yesterday.   Drug Allergies:  Allergies  Allergen Reactions  . Macrodantin [Nitrofurantoin Macrocrystal] Itching and Rash  . Shellfish Allergy   . Cefuroxime Axetil Rash and Other (See Comments)    REACTION: red face/rash  . Penicillins Rash    Physical Exam: BP 134/82 (BP Location: Left Arm, Patient Position: Sitting, Cuff Size: Normal)   Pulse 90   Temp 97.9 F (36.6 C) (Oral)   Resp 20   Ht 5' 5.2" (1.656 m)   Wt 188 lb 9.6 oz (85.5 kg)   SpO2 98%   BMI 31.19 kg/m    Physical Exam Vitals signs reviewed.  Constitutional:      Appearance: She is obese.  HENT:     Head:     Comments: Eyes normal.  Ears normal.  Nose normal.  Pharynx normal. Neck:     Musculoskeletal: Neck supple.  Cardiovascular:     Comments: S1-S2 normal no murmurs Pulmonary:     Comments: Clear to percussion and auscultation Lymphadenopathy:     Cervical: No cervical adenopathy.  Skin:    Comments: Erythematous papulosquamous eruption around her upper thorax.  Psoriatic eruption in the right heel  Neurological:     General: No focal deficit present.     Mental Status: She is alert and oriented to person, place, and time.  Psychiatric:      Mood and Affect: Mood normal.        Behavior: Behavior normal.        Thought Content: Thought content normal.        Judgment: Judgment normal.     Diagnostics: FVC 2.76 L FEV1 1.90 L.  Predicted FVC 3.37 L predicted FEV1 2.59 L-this shows a mild reduction in the FEV1  Assessment and Plan: 1. Psoriatic arthritis ([place])   2. Mild persistent asthma without complication   3. Other allergic rhinitis   4. Anaphylactic shock due to food, subsequent encounter   5. Rash and nonspecific skin eruption     Meds ordered this encounter  Medications  . methylPREDNISolone acetate (DEPO-MEDROL) injection 40 mg  . albuterol (PROVENTIL HFA;VENTOLIN HFA) 108 (90 Base) MCG/ACT inhaler    Sig: Inhale 2 puffs into the lungs every 4 (four) hours as needed for wheezing or shortness of breath.    Dispense:  8 g    Refill:  2  . triamcinolone cream (KENALOG) 0.1 %    Sig: Apply 1 application topically 2 (two) times daily.    Dispense:  15 g    Refill:  5    Patient Instructions  Zyrtec 10 mg-take 1 tablet once a day for runny nose or itching.  You may  take 1 tablet twice a day if needed Fluticasone 2 sprays per nostril once a day if needed for stuffy nose Montelukast 10 mg once a day for coughing or to prevent coughing or wheezing Proventil 2 puffs every 4 hours if needed for wheezing or coughing spells  Depo-Medrol 40 mg IM Triamcinolone 0.1% cream twice a day if needed to red itchy areas below the waist Continue on your other medication Do not have an allergy injections for 4 weeks Call us if you are not doing well on this treatment plan  Avoid peanuts, cashew and shellfish.  If you have an allergic reaction take Benadryl 50 mg every 4 hours and if you have life-threatening symptoms inject with EpiPen 0.3 mg      Return in about 4 weeks (around 01/22/2019).    Thank you for the opportunity to care for this patient.  Please do not hesitate to contact me with questions.  Penne Lash,  M.D.  [facility] [address] [place], [place] [address] [phone]

## 2018-12-25 NOTE — Patient Instructions (Addendum)
Zyrtec 10 mg-take 1 tablet once a day for runny nose or itching.  You may take 1 tablet twice a day if needed Fluticasone 2 sprays per nostril once a day if needed for stuffy nose Montelukast 10 mg once a day for coughing or to prevent coughing or wheezing Proventil 2 puffs every 4 hours if needed for wheezing or coughing spells  Depo-Medrol 40 mg IM Triamcinolone 0.1% cream twice a day if needed to red itchy areas below the waist Continue on your other medication Do not have an allergy injections for 4 weeks Call us if you are not doing well on this treatment plan  Avoid peanuts, cashew and shellfish.  If you have an allergic reaction take Benadryl 50 mg every 4 hours and if you have life-threatening symptoms inject with EpiPen 0.3 mg

## 2018-12-26 NOTE — Addendum Note (Signed)
Addended by: Orpah Greek D on: 12/26/2018 12:02 PM   Modules accepted: Orders

## 2019-01-03 ENCOUNTER — Ambulatory Visit (INDEPENDENT_AMBULATORY_CARE_PROVIDER_SITE_OTHER): Payer: PRIVATE HEALTH INSURANCE

## 2019-01-03 DIAGNOSIS — J309 Allergic rhinitis, unspecified: Secondary | ICD-10-CM | POA: Diagnosis not present

## 2019-01-15 ENCOUNTER — Telehealth: Payer: Self-pay

## 2019-01-15 NOTE — Telephone Encounter (Signed)
Per Dr. Shaune Leeks, call patient and see how she is doing with the rash from [place] 12/25/2018.    Called and spoke with patient.  She is doing much better. Rash improved.  Depo Medrol 40 mg IM injection in clinic at office visit helped symptoms a lot.  Patient is continuing to follow up with her rheumatologist, Dr. Leigh Aurora in [place] and sees a dermatologist at [place] at [facility] ([facility]  She sees a PA there and is very impressed with the care they have provided for her.    Patient does not have a 4 week follow up with Dr. Shaune Leeks.  I offered her an appointment and she states she will call back to make a visit. She wants to discuss discontinuing ITX.  Message forwarded to Dr. Shaune Leeks. Dr. Shaune Leeks asked about patient and I verbally gave all information to Dr. Shaune Leeks. He requested I not forward the information as he has been informed.

## 2019-01-21 ENCOUNTER — Other Ambulatory Visit: Payer: Self-pay | Admitting: Family

## 2019-01-24 ENCOUNTER — Other Ambulatory Visit: Payer: Self-pay | Admitting: Women's Health

## 2019-02-21 ENCOUNTER — Ambulatory Visit (INDEPENDENT_AMBULATORY_CARE_PROVIDER_SITE_OTHER): Payer: PRIVATE HEALTH INSURANCE

## 2019-02-21 DIAGNOSIS — J309 Allergic rhinitis, unspecified: Secondary | ICD-10-CM

## 2019-03-18 ENCOUNTER — Other Ambulatory Visit: Payer: Self-pay | Admitting: Pediatrics

## 2019-03-28 ENCOUNTER — Ambulatory Visit (INDEPENDENT_AMBULATORY_CARE_PROVIDER_SITE_OTHER): Payer: PRIVATE HEALTH INSURANCE

## 2019-03-28 DIAGNOSIS — J309 Allergic rhinitis, unspecified: Secondary | ICD-10-CM | POA: Diagnosis not present

## 2019-04-08 ENCOUNTER — Ambulatory Visit (INDEPENDENT_AMBULATORY_CARE_PROVIDER_SITE_OTHER): Payer: PRIVATE HEALTH INSURANCE

## 2019-04-08 DIAGNOSIS — J309 Allergic rhinitis, unspecified: Secondary | ICD-10-CM | POA: Diagnosis not present

## 2019-04-17 ENCOUNTER — Ambulatory Visit (INDEPENDENT_AMBULATORY_CARE_PROVIDER_SITE_OTHER): Payer: PRIVATE HEALTH INSURANCE

## 2019-04-17 DIAGNOSIS — J309 Allergic rhinitis, unspecified: Secondary | ICD-10-CM

## 2019-04-22 ENCOUNTER — Other Ambulatory Visit: Payer: Self-pay | Admitting: Women's Health

## 2019-04-22 ENCOUNTER — Other Ambulatory Visit: Payer: Self-pay

## 2019-04-22 MED ORDER — ALPRAZOLAM 0.25 MG PO TABS: ORAL_TABLET | ORAL | 1 refills | Status: DC

## 2019-04-22 NOTE — Telephone Encounter (Signed)
Ok for refill? 

## 2019-04-22 NOTE — Telephone Encounter (Signed)
Okay for refill?  

## 2019-04-22 NOTE — Telephone Encounter (Signed)
Called into pharmacy

## 2019-04-25 ENCOUNTER — Ambulatory Visit (INDEPENDENT_AMBULATORY_CARE_PROVIDER_SITE_OTHER): Payer: PRIVATE HEALTH INSURANCE

## 2019-04-25 DIAGNOSIS — J309 Allergic rhinitis, unspecified: Secondary | ICD-10-CM | POA: Diagnosis not present

## 2019-04-30 DIAGNOSIS — S00412A Abrasion of left ear, initial encounter: Secondary | ICD-10-CM | POA: Insufficient documentation

## 2019-04-30 DIAGNOSIS — H6123 Impacted cerumen, bilateral: Secondary | ICD-10-CM | POA: Insufficient documentation

## 2019-05-07 ENCOUNTER — Ambulatory Visit (INDEPENDENT_AMBULATORY_CARE_PROVIDER_SITE_OTHER): Payer: PRIVATE HEALTH INSURANCE

## 2019-05-07 DIAGNOSIS — J309 Allergic rhinitis, unspecified: Secondary | ICD-10-CM | POA: Diagnosis not present

## 2019-05-10 ENCOUNTER — Encounter: Payer: Self-pay | Admitting: Podiatry

## 2019-05-10 ENCOUNTER — Other Ambulatory Visit: Payer: Self-pay

## 2019-05-10 ENCOUNTER — Ambulatory Visit: Payer: PRIVATE HEALTH INSURANCE | Admitting: Podiatry

## 2019-05-10 VITALS — Temp 98.2°F

## 2019-05-10 DIAGNOSIS — L309 Dermatitis, unspecified: Secondary | ICD-10-CM

## 2019-05-10 DIAGNOSIS — M722 Plantar fascial fibromatosis: Secondary | ICD-10-CM | POA: Diagnosis not present

## 2019-05-10 MED ORDER — TRIAMCINOLONE ACETONIDE 10 MG/ML IJ SUSP
10.0000 mg | Freq: Once | INTRAMUSCULAR | Status: AC
  Administered 2019-05-10: 10 mg

## 2019-05-10 NOTE — Progress Notes (Signed)
   Subjective:    Patient ID: Melissa Middleton, female    DOB: November 25, 1956, 63 y.o.   MRN: [id number]  HPI    Review of Systems  All other systems reviewed and are negative.      Objective:   Physical Exam        Assessment & Plan:

## 2019-05-10 NOTE — Progress Notes (Signed)
Subjective:   Patient ID: Melissa Middleton, female   DOB: 63 y.o.   MRN: [id number]   HPI Patient presents stating that she has had a history of psoriasis and it does not seem her medicine is working well and she is a lot of pain in the heel region bilaterally left being worse than the right.  Patient does not smoke and likes to be active   Review of Systems  All other systems reviewed and are negative.       Objective:  Physical Exam Vitals signs and nursing note reviewed.  Constitutional:      Appearance: She is well-developed.  Pulmonary:     Effort: Pulmonary effort is normal.  Musculoskeletal: Normal range of motion.  Skin:    General: Skin is warm.  Neurological:     Mental Status: She is alert.     Neurovascular status is found to be intact muscle strength is adequate range of motion within normal limits.  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

## 2019-05-20 ENCOUNTER — Other Ambulatory Visit: Payer: Self-pay | Admitting: Family

## 2019-06-03 ENCOUNTER — Ambulatory Visit (INDEPENDENT_AMBULATORY_CARE_PROVIDER_SITE_OTHER): Payer: PRIVATE HEALTH INSURANCE

## 2019-06-03 DIAGNOSIS — J309 Allergic rhinitis, unspecified: Secondary | ICD-10-CM

## 2019-06-27 ENCOUNTER — Telehealth: Payer: Self-pay | Admitting: Family

## 2019-06-27 NOTE — Telephone Encounter (Signed)
Pt called in stating she has had diarrhea x 3 weeks and yesterday had waves of nausea. I advised pt we can schedule virtual visit. Pt stating that she doesn't see how that would help and requesting to be seen in the office. She said she knows it isn't COVID related. She said she might just have to go to [facility] and we cannot see her in office. Please advise.

## 2019-06-28 NOTE — Telephone Encounter (Signed)
Called patient left msg to call back for an appt. I advised her that only PM appt was available. Also that if her sxs worsen to go to ER. I will try to call her back later today.

## 2019-06-28 NOTE — Telephone Encounter (Signed)
Please contact pt an schedule a face to face on Tuesday. Please let her know that if her symptoms worsen in the meantime she should go to the ER.

## 2019-07-02 ENCOUNTER — Encounter: Payer: Self-pay | Admitting: Family

## 2019-07-02 ENCOUNTER — Other Ambulatory Visit: Payer: Self-pay

## 2019-07-02 ENCOUNTER — Ambulatory Visit: Payer: 59 | Admitting: Family

## 2019-07-02 VITALS — BP 108/71 | HR 90 | Temp 98.6°F | Resp 16 | Ht 65.0 in | Wt 185.8 lb

## 2019-07-02 DIAGNOSIS — F419 Anxiety disorder, unspecified: Secondary | ICD-10-CM

## 2019-07-02 DIAGNOSIS — B353 Tinea pedis: Secondary | ICD-10-CM | POA: Diagnosis not present

## 2019-07-02 DIAGNOSIS — L4050 Arthropathic psoriasis, unspecified: Secondary | ICD-10-CM | POA: Diagnosis not present

## 2019-07-02 DIAGNOSIS — K219 Gastro-esophageal reflux disease without esophagitis: Secondary | ICD-10-CM

## 2019-07-02 DIAGNOSIS — Z23 Encounter for immunization: Secondary | ICD-10-CM

## 2019-07-02 MED ORDER — TERBINAFINE HCL 250 MG PO TABS: 250.0000 mg | ORAL_TABLET | Freq: Every day | ORAL | 0 refills | Status: DC

## 2019-07-02 NOTE — Progress Notes (Signed)
Subjective:    Patient ID: Melissa Middleton, female    DOB: Apr 06, 1956, 63 y.o.   MRN: [id number]  HPI  Patient is a 63 yr old female who presents today for follow up.  She reached out to [place] on 7/9 with c/o diarrhea- however she reports that these symptoms are improved.  Psoriatic arthritis- reports humira is really helping with her joint pain.  Anxiety- reports anxiety is well controlled.  She is prescribed Xanax from her gynecologist.  GERD- maintained on nexium.  Stable symptoms per patient    Review of Systems See HPI  Past Medical History:  Diagnosis Date  . Allergic rhinitis   . Anxiety   . Asthma   . Benign neoplasm of stomach    gastric polyps  . Bronchitis    recurrent  . Diaphragmatic hernia without mention of obstruction or gangrene   . Diverticulosis of colon (without mention of hemorrhage)   . Esophageal reflux    see GI  . GERD (gastroesophageal reflux disease)   . Insomnia   . Osteoarthritis    sees ortho-has had steroid shot in knee  . Pneumonia   . Post menopausal problems    symptoms  . Premenstrual tension syndromes    gyn uses alprazolam and spironolactone prn ofr treatment  . Psoriasis   . Psoriatic arthritis ([place])   . Sinusitis    recurrent  . Status post dilation of esophageal narrowing   . Stricture and stenosis of esophagus   . Unspecified asthma(493.90)   . Vitamin D deficiency      Social History   Socioeconomic History  . Marital status: Married    Spouse name: Not on file  . Number of children: 2  . Years of education: Not on file  . Highest education level: Not on file  Occupational History    Employer: UNEMPLOYED    Comment: Administrative work  Social Needs  . Financial resource strain: Not on file  . Food insecurity    Worry: Not on file    Inability: Not on file  . Transportation needs    Medical: Not on file    Non-medical: Not on file  Tobacco Use  . Smoking status: Never Smoker  . Smokeless tobacco: Never  Used  Substance and Sexual Activity  . Alcohol use: No    Alcohol/week: 0.0 standard drinks  . Drug use: No  . Sexual activity: Yes    Comment: INTERCOURSE AGE 67, SEXUAL PARTNERS LESS THAN 5  Lifestyle  . Physical activity    Days per week: Not on file    Minutes per session: Not on file  . Stress: Not on file  Relationships  . Social Herbalist on phone: Not on file    Gets together: Not on file    Attends religious service: Not on file    Active member of club or organization: Not on file    Attends meetings of clubs or organizations: Not on file    Relationship status: Not on file  . Intimate partner violence    Fear of current or ex partner: Not on file    Emotionally abused: Not on file    Physically abused: Not on file    Forced sexual activity: Not on file  Other Topics Concern  . Not on file  Social History Narrative   Daughter age 25   Son age 50   Married   Enjoys Chief Executive Officer, Quarry manager shows  Past Surgical History:  Procedure Laterality Date  . CESAREAN SECTION    . ENDOMETRIAL ABLATION    . NASAL SINUS SURGERY    . TOTAL KNEE ARTHROPLASTY Right 11/2009  . TUBAL LIGATION  2004   with ablation    Family History  Problem Relation Age of Onset  . Hypertension Mother        died from an asthma attack  . Asthma Mother   . Diabetes Father        died from sepsis  . Hypertension Father   . Arthritis Father   . Ovarian cancer Sister   . Melanoma Sister   . Hypertension Maternal Grandfather   . Heart attack Maternal Grandfather   . Diabetes Paternal Grandmother   . Arthritis Paternal Grandmother   . Migraines Neg Hx     Allergies  Allergen Reactions  . Macrodantin [Nitrofurantoin Macrocrystal] Itching and Rash  . Shellfish Allergy   . Cefuroxime Axetil Rash and Other (See Comments)    REACTION: red face/rash  . Penicillins Rash    Current Outpatient Medications on File Prior to Visit  Medication Sig Dispense Refill  . Adalimumab 40  MG/0.8ML PNKT Inject 40 mg into the skin.    Marland Kitchen albuterol (PROVENTIL HFA;VENTOLIN HFA) 108 (90 Base) MCG/ACT inhaler INHALE 2 PUFFS INTO THE LUNGS EVERY 4 (FOUR) HOURS AS NEEDED FOR WHEEZING OR SHORTNESS OF BREATH. 18 Inhaler 2  . ALPRAZolam (XANAX) 0.25 MG tablet TAKE 1 TABLET BY MOUTH AT BEDTIME AS NEEDED FOR ANXIETY 30 tablet 1  . DUOBRII 0.01-0.045 % LOTN     . EPINEPHrine (EPIPEN 2-PAK) 0.3 mg/0.3 mL IJ SOAJ injection Use as directed for severe allergic reactions 2 Device 2  . esomeprazole (NEXIUM) 40 MG capsule Take 1 capsule (40 mg total) by mouth daily. 90 capsule 3  . fluticasone (FLONASE) 50 MCG/ACT nasal spray TWO SPRAYS EACH NOSTRIL ONCE A DAY FOR NASAL CONGESTION OR DRAINAGE. 16 g 5  . ipratropium-albuterol (DUONEB) 0.5-2.5 (3) MG/3ML SOLN ipratropium 0.5 mg-albuterol 3 mg (2.5 mg base)/3 mL nebulization soln    . montelukast (SINGULAIR) 10 MG tablet TAKE 1 TABLET BY MOUTH EVERYDAY AT BEDTIME 90 tablet 0  . spironolactone (ALDACTONE) 25 MG tablet TAKE 1 TABLET (25 MG TOTAL) BY MOUTH DAILY AS NEEDED. 90 tablet 1  . traZODone (DESYREL) 100 MG tablet TAKE 1 TABLET BY MOUTH EVERYDAY AT BEDTIME 90 tablet 0  . triamcinolone cream (KENALOG) 0.1 % Apply 1 application topically 2 (two) times daily. 15 g 5  . YUVAFEM 10 MCG TABS vaginal tablet PLACE 1 TABLET (10 MCG TOTAL) 2 (TWO) TIMES A WEEK VAGINALLY. 24 tablet 4   Current Facility-Administered Medications on File Prior to Visit  Medication Dose Route Frequency Provider Last Rate Last Dose  . 0.9 %  sodium chloride infusion  500 mL Intravenous Once Danis, Estill Cotta III, MD        BP 108/71 (BP Location: Right Arm, Patient Position: Sitting, Cuff Size: Large)   Pulse 90   Temp 98.6 F (37 C) (Oral)   Resp 16   Ht 5\' 5"  (1.651 m)   Wt 185 lb 12.8 oz (84.3 kg)   SpO2 99%   BMI 30.92 kg/m       Objective:   Physical Exam Constitutional:      Appearance: She is well-developed.  Neck:     Musculoskeletal: Neck supple.     Thyroid:  No thyromegaly.  Cardiovascular:     Rate and Rhythm:  Normal rate and regular rhythm.     Heart sounds: Normal heart sounds. No murmur.  Pulmonary:     Effort: Pulmonary effort is normal. No respiratory distress.     Breath sounds: Normal breath sounds. No wheezing.  Skin:    General: Skin is warm and dry.     Comments: Thick peeling skin noted on bilateral feet  She has some annular lesions on her legs consistent with psoriasis plaque  Neurological:     Mental Status: She is alert and oriented to person, place, and time.  Psychiatric:        Behavior: Behavior normal.        Thought Content: Thought content normal.        Judgment: Judgment normal.          Assessment & Plan:  Anxiety-stable.  GERD-stable on Nexium.  Continue same.  Tinea pedis- we will give trial of oral Lamisil 250 mg once daily for 2 weeks  Psoriatic arthritis-stable, management per rheumatology

## 2019-07-09 NOTE — Progress Notes (Signed)
Vials exp 07-08-2020 

## 2019-07-11 DIAGNOSIS — J3089 Other allergic rhinitis: Secondary | ICD-10-CM | POA: Diagnosis not present

## 2019-07-15 ENCOUNTER — Ambulatory Visit (INDEPENDENT_AMBULATORY_CARE_PROVIDER_SITE_OTHER): Payer: PRIVATE HEALTH INSURANCE

## 2019-07-15 DIAGNOSIS — J309 Allergic rhinitis, unspecified: Secondary | ICD-10-CM | POA: Diagnosis not present

## 2019-08-08 ENCOUNTER — Other Ambulatory Visit: Payer: Self-pay | Admitting: Family

## 2019-08-12 ENCOUNTER — Ambulatory Visit (INDEPENDENT_AMBULATORY_CARE_PROVIDER_SITE_OTHER): Payer: PRIVATE HEALTH INSURANCE

## 2019-08-12 DIAGNOSIS — J309 Allergic rhinitis, unspecified: Secondary | ICD-10-CM

## 2019-08-27 ENCOUNTER — Other Ambulatory Visit: Payer: Self-pay | Admitting: Women's Health

## 2019-08-27 DIAGNOSIS — Z1231 Encounter for screening mammogram for malignant neoplasm of breast: Secondary | ICD-10-CM

## 2019-09-03 ENCOUNTER — Ambulatory Visit (INDEPENDENT_AMBULATORY_CARE_PROVIDER_SITE_OTHER): Payer: 59

## 2019-09-03 ENCOUNTER — Other Ambulatory Visit: Payer: Self-pay

## 2019-09-03 DIAGNOSIS — Z23 Encounter for immunization: Secondary | ICD-10-CM

## 2019-09-03 NOTE — Progress Notes (Signed)
Pre visit review using our clinic review tool, if applicable. No additional management support is needed unless otherwise documented below in the visit note.  Patient here today for shingrix vaccine. 0.75mL shingrix given in  Right deltoid IM. Patient tolerated well.

## 2019-09-12 ENCOUNTER — Other Ambulatory Visit: Payer: Self-pay

## 2019-09-12 MED ORDER — ALPRAZOLAM 0.25 MG PO TABS: ORAL_TABLET | ORAL | 1 refills | Status: DC

## 2019-09-12 NOTE — Telephone Encounter (Signed)
Ok for refill? 

## 2019-09-12 NOTE — Telephone Encounter (Signed)
Called into pharmacy

## 2019-09-18 ENCOUNTER — Ambulatory Visit (INDEPENDENT_AMBULATORY_CARE_PROVIDER_SITE_OTHER): Payer: PRIVATE HEALTH INSURANCE

## 2019-09-18 DIAGNOSIS — J309 Allergic rhinitis, unspecified: Secondary | ICD-10-CM | POA: Diagnosis not present

## 2019-09-23 ENCOUNTER — Other Ambulatory Visit: Payer: Self-pay

## 2019-09-23 ENCOUNTER — Ambulatory Visit (INDEPENDENT_AMBULATORY_CARE_PROVIDER_SITE_OTHER): Payer: 59 | Admitting: Family

## 2019-09-23 VITALS — Ht 65.0 in | Wt 183.0 lb

## 2019-09-23 DIAGNOSIS — H6122 Impacted cerumen, left ear: Secondary | ICD-10-CM

## 2019-09-23 DIAGNOSIS — J019 Acute sinusitis, unspecified: Secondary | ICD-10-CM

## 2019-09-23 DIAGNOSIS — L4050 Arthropathic psoriasis, unspecified: Secondary | ICD-10-CM | POA: Diagnosis not present

## 2019-09-23 MED ORDER — DOXYCYCLINE HYCLATE 100 MG PO TABS: 100.0000 mg | ORAL_TABLET | Freq: Two times a day (BID) | ORAL | 0 refills | Status: DC

## 2019-09-23 MED ORDER — AZITHROMYCIN 250 MG PO TABS: ORAL_TABLET | ORAL | 0 refills | Status: DC

## 2019-09-23 NOTE — Progress Notes (Signed)
Virtual Visit via Telephone Note  I connected with Melissa Middleton on 09/23/19 at 10:20 AM EDT by telephone and verified that I am speaking with the correct person using two identifiers.  Location: Patient: home Provider: home   I discussed the limitations, risks, security and privacy concerns of performing an evaluation and management service by telephone and the availability of in person appointments. I also discussed with the patient that there may be a patient responsible charge related to this service. The patient expressed understanding and agreed to proceed.   History of Present Illness:  Patient reports that for 3 weeks she has had some "phlegm" in her throat. Reports that this week she has felt fatigued. Stayed in bed all day yesterday.  Reports that for the past year she has been twice to ENT and had her ear cleaned out.  Reports that she last had her ear cleaned out on 08/22/19.  She is requesting a referral to an ENT.  Denies left ear pain.  Feels blocked.  She reports that ear feels "a little bit wet." However she put peroxide in.    She reports + pain in her cheeks.  Reports bilateral maxillary sinus tenderness and frontal tenderness to palpation.   Psoriatic Arthritis- following with Dr. Amil Amen. Was switched from humira to cosentyx. Still has plaques on her feet.   Past Medical History:  Diagnosis Date  . Allergic rhinitis   . Anxiety   . Asthma   . Benign neoplasm of stomach    gastric polyps  . Bronchitis    recurrent  . Diaphragmatic hernia without mention of obstruction or gangrene   . Diverticulosis of colon (without mention of hemorrhage)   . Esophageal reflux    see GI  . GERD (gastroesophageal reflux disease)   . Insomnia   . Osteoarthritis    sees ortho-has had steroid shot in knee  . Pneumonia   . Post menopausal problems    symptoms  . Premenstrual tension syndromes    gyn uses alprazolam and spironolactone prn ofr treatment  . Psoriasis   .  Psoriatic arthritis ([place])   . Sinusitis    recurrent  . Status post dilation of esophageal narrowing   . Stricture and stenosis of esophagus   . Unspecified asthma(493.90)   . Vitamin D deficiency      Social History   Socioeconomic History  . Marital status: Married    Spouse name: Not on file  . Number of children: 2  . Years of education: Not on file  . Highest education level: Not on file  Occupational History    Employer: UNEMPLOYED    Comment: Administrative work  Social Needs  . Financial resource strain: Not on file  . Food insecurity    Worry: Not on file    Inability: Not on file  . Transportation needs    Medical: Not on file    Non-medical: Not on file  Tobacco Use  . Smoking status: Never Smoker  . Smokeless tobacco: Never Used  Substance and Sexual Activity  . Alcohol use: No    Alcohol/week: 0.0 standard drinks  . Drug use: No  . Sexual activity: Yes    Comment: INTERCOURSE AGE 23, SEXUAL PARTNERS LESS THAN 5  Lifestyle  . Physical activity    Days per week: Not on file    Minutes per session: Not on file  . Stress: Not on file  Relationships  . Social Herbalist on  phone: Not on file    Gets together: Not on file    Attends religious service: Not on file    Active member of club or organization: Not on file    Attends meetings of clubs or organizations: Not on file    Relationship status: Not on file  . Intimate partner violence    Fear of current or ex partner: Not on file    Emotionally abused: Not on file    Physically abused: Not on file    Forced sexual activity: Not on file  Other Topics Concern  . Not on file  Social History Narrative   Daughter age 61   Son age 77   Married   Enjoys Chief Executive Officer, Quarry manager shows    Past Surgical History:  Procedure Laterality Date  . CESAREAN SECTION    . ENDOMETRIAL ABLATION    . NASAL SINUS SURGERY    . TOTAL KNEE ARTHROPLASTY Right 11/2009  . TUBAL LIGATION  2004   with ablation     Family History  Problem Relation Age of Onset  . Hypertension Mother        died from an asthma attack  . Asthma Mother   . Diabetes Father        died from sepsis  . Hypertension Father   . Arthritis Father   . Ovarian cancer Sister   . Melanoma Sister   . Hypertension Maternal Grandfather   . Heart attack Maternal Grandfather   . Diabetes Paternal Grandmother   . Arthritis Paternal Grandmother   . Migraines Neg Hx     Allergies  Allergen Reactions  . Macrodantin [Nitrofurantoin Macrocrystal] Itching and Rash  . Shellfish Allergy   . Cefuroxime Axetil Rash and Other (See Comments)    REACTION: red face/rash  . Penicillins Rash    Current Outpatient Medications on File Prior to Visit  Medication Sig Dispense Refill  . Adalimumab 40 MG/0.8ML PNKT Inject 40 mg into the skin.    Marland Kitchen albuterol (PROVENTIL HFA;VENTOLIN HFA) 108 (90 Base) MCG/ACT inhaler INHALE 2 PUFFS INTO THE LUNGS EVERY 4 (FOUR) HOURS AS NEEDED FOR WHEEZING OR SHORTNESS OF BREATH. 18 Inhaler 2  . ALPRAZolam (XANAX) 0.25 MG tablet TAKE 1 TABLET BY MOUTH AT BEDTIME AS NEEDED FOR ANXIETY 30 tablet 1  . DUOBRII 0.01-0.045 % LOTN     . EPINEPHrine (EPIPEN 2-PAK) 0.3 mg/0.3 mL IJ SOAJ injection Use as directed for severe allergic reactions 2 Device 2  . esomeprazole (NEXIUM) 40 MG capsule Take 1 capsule (40 mg total) by mouth daily. 90 capsule 3  . fluticasone (FLONASE) 50 MCG/ACT nasal spray TWO SPRAYS EACH NOSTRIL ONCE A DAY FOR NASAL CONGESTION OR DRAINAGE. 16 g 5  . ipratropium-albuterol (DUONEB) 0.5-2.5 (3) MG/3ML SOLN ipratropium 0.5 mg-albuterol 3 mg (2.5 mg base)/3 mL nebulization soln    . montelukast (SINGULAIR) 10 MG tablet TAKE 1 TABLET BY MOUTH EVERYDAY AT BEDTIME 90 tablet 1  . Secukinumab, 300 MG Dose, (COSENTYX SENSOREADY, 300 MG,) 150 MG/ML SOAJ     . spironolactone (ALDACTONE) 25 MG tablet TAKE 1 TABLET (25 MG TOTAL) BY MOUTH DAILY AS NEEDED. 90 tablet 1  . terbinafine (LAMISIL) 250 MG tablet Take  1 tablet (250 mg total) by mouth daily. 14 tablet 0  . traZODone (DESYREL) 100 MG tablet TAKE 1 TABLET BY MOUTH EVERYDAY AT BEDTIME 90 tablet 0  . triamcinolone cream (KENALOG) 0.1 % Apply 1 application topically 2 (two) times daily. 15 g 5  .  YUVAFEM 10 MCG TABS vaginal tablet PLACE 1 TABLET (10 MCG TOTAL) 2 (TWO) TIMES A WEEK VAGINALLY. 24 tablet 4   Current Facility-Administered Medications on File Prior to Visit  Medication Dose Route Frequency Provider Last Rate Last Dose  . 0.9 %  sodium chloride infusion  500 mL Intravenous Once [name], MD        Ht 5\' 5"  (1.651 m)   Wt 183 lb (83 kg)   BMI 30.45 kg/m      Observations/Objective:   Gen: Awake, alert, no acute distress Resp: Breathing sounds even and non-labored ENT:  Reports + tender  Psych: calm/pleasant demeanor Neuro: Alert and Oriented x 3, + facial symmetry, speech is clear.  Assessment and Plan:  Psoriatic arthritis- management per rheumatology.  Sinusitis- will rx with doxycycline (was going to rx with zpak but she states it does not help her). Rx for zpak was cancelled at her pharmacy.  Ceruminosis- reports recurrent ceruminosis. Requests referral to a [facility]. Referral placed to Dr. Benjamine Mola and pt was given number to call.   Follow Up Instructions:    I discussed the assessment and treatment plan with the patient. The patient was provided an opportunity to ask questions and all were answered. The patient agreed with the plan and demonstrated an understanding of the instructions.   The patient was advised to call back or seek an in-person evaluation if the symptoms worsen or if the condition fails to improve as anticipated.  I provided 20 minutes of non-face-to-face time during this encounter.   Nance Pear, NP

## 2019-10-06 ENCOUNTER — Other Ambulatory Visit: Payer: Self-pay | Admitting: Family

## 2019-10-10 ENCOUNTER — Other Ambulatory Visit: Payer: Self-pay

## 2019-10-10 ENCOUNTER — Ambulatory Visit
Admission: RE | Admit: 2019-10-10 | Discharge: 2019-10-10 | Disposition: A | Discharge status: Home / Self Care | Payer: 59 | Source: Ambulatory Visit | Attending: Women's Health | Admitting: Women's Health

## 2019-10-10 DIAGNOSIS — Z1231 Encounter for screening mammogram for malignant neoplasm of breast: Secondary | ICD-10-CM

## 2019-10-17 ENCOUNTER — Other Ambulatory Visit: Payer: Self-pay | Admitting: Women's Health

## 2019-10-17 NOTE — Telephone Encounter (Signed)
CE is scheduled 11/04/19.

## 2019-10-17 NOTE — Telephone Encounter (Signed)
Ok for refill? 

## 2019-10-28 ENCOUNTER — Ambulatory Visit (INDEPENDENT_AMBULATORY_CARE_PROVIDER_SITE_OTHER): Payer: PRIVATE HEALTH INSURANCE

## 2019-10-28 DIAGNOSIS — J309 Allergic rhinitis, unspecified: Secondary | ICD-10-CM

## 2019-10-31 ENCOUNTER — Other Ambulatory Visit: Payer: Self-pay | Admitting: Family

## 2019-11-01 ENCOUNTER — Other Ambulatory Visit: Payer: Self-pay

## 2019-11-04 ENCOUNTER — Other Ambulatory Visit: Payer: Self-pay

## 2019-11-04 ENCOUNTER — Encounter: Payer: Self-pay | Admitting: Women's Health

## 2019-11-04 ENCOUNTER — Ambulatory Visit (INDEPENDENT_AMBULATORY_CARE_PROVIDER_SITE_OTHER): Payer: PRIVATE HEALTH INSURANCE | Admitting: Women's Health

## 2019-11-04 ENCOUNTER — Other Ambulatory Visit: Payer: Self-pay | Admitting: Family

## 2019-11-04 VITALS — BP 122/78 | Ht 65.0 in | Wt 176.0 lb

## 2019-11-04 DIAGNOSIS — R140 Abdominal distension (gaseous): Secondary | ICD-10-CM

## 2019-11-04 DIAGNOSIS — Z01419 Encounter for gynecological examination (general) (routine) without abnormal findings: Secondary | ICD-10-CM

## 2019-11-04 MED ORDER — TRAZODONE HCL 100 MG PO TABS: ORAL_TABLET | ORAL | 0 refills | Status: DC

## 2019-11-04 MED ORDER — ESTRADIOL 10 MCG VA TABS: ORAL_TABLET | VAGINAL | 4 refills | Status: DC

## 2019-11-04 MED ORDER — ALPRAZOLAM 0.25 MG PO TABS: ORAL_TABLET | ORAL | 0 refills | Status: DC

## 2019-11-04 NOTE — Patient Instructions (Addendum)
Good to see you today!  Health Maintenance for Postmenopausal Women Menopause is a normal process in which your ability to get pregnant comes to an end. This process happens slowly over many months or years, usually between the ages of 86 and 19. Menopause is complete when you have missed your menstrual periods for 12 months. It is important to talk with your health care provider about some of the most common conditions that affect women after menopause (postmenopausal women). These include heart disease, cancer, and bone loss (osteoporosis). Adopting a healthy lifestyle and getting preventive care can help to promote your health and wellness. The actions you take can also lower your chances of developing some of these common conditions. What should I know about menopause? During menopause, you may get a number of symptoms, such as:  Hot flashes. These can be moderate or severe.  Night sweats.  Decrease in sex drive.  Mood swings.  Headaches.  Tiredness.  Irritability.  Memory problems.  Insomnia. Choosing to treat or not to treat these symptoms is a decision that you make with your health care provider. Do I need hormone replacement therapy?  Hormone replacement therapy is effective in treating symptoms that are caused by menopause, such as hot flashes and night sweats.  Hormone replacement carries certain risks, especially as you become older. If you are thinking about using estrogen or estrogen with progestin, discuss the benefits and risks with your health care provider. What is my risk for heart disease and stroke? The risk of heart disease, heart attack, and stroke increases as you age. One of the causes may be a change in the body's hormones during menopause. This can affect how your body uses dietary fats, triglycerides, and cholesterol. Heart attack and stroke are medical emergencies. There are many things that you can do to help prevent heart disease and stroke. Watch your  blood pressure  High blood pressure causes heart disease and increases the risk of stroke. This is more likely to develop in people who have high blood pressure readings, are of African descent, or are overweight.  Have your blood pressure checked: ? Every 3-5 years if you are 57-65 years of age. ? Every year if you are 21 years old or older. Eat a healthy diet   Eat a diet that includes plenty of vegetables, fruits, low-fat dairy products, and lean protein.  Do not eat a lot of foods that are high in solid fats, added sugars, or sodium. Get regular exercise Get regular exercise. This is one of the most important things you can do for your health. Most adults should:  Try to exercise for at least 150 minutes each week. The exercise should increase your heart rate and make you sweat (moderate-intensity exercise).  Try to do strengthening exercises at least twice each week. Do these in addition to the moderate-intensity exercise.  Spend less time sitting. Even light physical activity can be beneficial. Other tips  Work with your health care provider to achieve or maintain a healthy weight.  Do not use any products that contain nicotine or tobacco, such as cigarettes, e-cigarettes, and chewing tobacco. If you need help quitting, ask your health care provider.  Know your numbers. Ask your health care provider to check your cholesterol and your blood sugar (glucose). Continue to have your blood tested as directed by your health care provider. Do I need screening for cancer? Depending on your health history and family history, you may need to have cancer screening at  different stages of your life. This may include screening for:  Breast cancer.  Cervical cancer.  Lung cancer.  Colorectal cancer. What is my risk for osteoporosis? After menopause, you may be at increased risk for osteoporosis. Osteoporosis is a condition in which bone destruction happens more quickly than new bone  creation. To help prevent osteoporosis or the bone fractures that can happen because of osteoporosis, you may take the following actions:  If you are 50-18 years old, get at least 1,000 mg of calcium and at least 600 mg of vitamin D per day.  If you are older than age 42 but younger than age 23, get at least 1,200 mg of calcium and at least 600 mg of vitamin D per day.  If you are older than age 28, get at least 1,200 mg of calcium and at least 800 mg of vitamin D per day. Smoking and drinking excessive alcohol increase the risk of osteoporosis. Eat foods that are rich in calcium and vitamin D, and do weight-bearing exercises several times each week as directed by your health care provider. How does menopause affect my mental health? Depression may occur at any age, but it is more common as you become older. Common symptoms of depression include:  Low or sad mood.  Changes in sleep patterns.  Changes in appetite or eating patterns.  Feeling an overall lack of motivation or enjoyment of activities that you previously enjoyed.  Frequent crying spells. Talk with your health care provider if you think that you are experiencing depression. General instructions See your health care provider for regular wellness exams and vaccines. This may include:  Scheduling regular health, dental, and eye exams.  Getting and maintaining your vaccines. These include: ? Influenza vaccine. Get this vaccine each year before the flu season begins. ? Pneumonia vaccine. ? Shingles vaccine. ? Tetanus, diphtheria, and pertussis (Tdap) booster vaccine. Your health care provider may also recommend other immunizations. Tell your health care provider if you have ever been abused or do not feel safe at home. Summary  Menopause is a normal process in which your ability to get pregnant comes to an end.  This condition causes hot flashes, night sweats, decreased interest in sex, mood swings, headaches, or lack of  sleep.  Treatment for this condition may include hormone replacement therapy.  Take actions to keep yourself healthy, including exercising regularly, eating a healthy diet, watching your weight, and checking your blood pressure and blood sugar levels.  Get screened for cancer and depression. Make sure that you are up to date with all your vaccines. This information is not intended to replace advice given to you by your health care provider. Make sure you discuss any questions you have with your health care provider. Document Released: 01/27/2006 Document Revised: 11/28/2018 Document Reviewed: 11/28/2018 Elsevier Patient Education  2020 [employer].

## 2019-11-04 NOTE — Telephone Encounter (Signed)
Copied from [place] [id number]. Topic: General - Other >> Nov 04, 2019 12:35 PM Yvette Rack wrote: Reason for CRM: Pt stated her pharmacy informed her that they have not received the Rx for traZODone (DESYREL) 100 MG tablet. Pt requests that the refill request be sent again.

## 2019-11-04 NOTE — Telephone Encounter (Signed)
Patient calling and states that she spoke with the pharmacy and they advised her that when the prescription was sent over, Friday morning, the pharmacy system was down. That is the reason they did not receive the prescription. Please advise.

## 2019-11-04 NOTE — Progress Notes (Signed)
Melissa Middleton 1956-10-07 [id number]    History:    Presents for annual exam.  Postmenopausal on no HRT with no bleeding.  Normal Pap and mammogram history.  Has used Vagifem in the past but not recently would like to start again.  Sister ovarian cancer survivor.  Reports of bloating on occasion.  2019 - colonoscopy.  2017 normal DEXA.  Had Shingrix this past year.  Primary care manages asthma.  Uses Xanax sparingly requests refill.  Biggest problem is psoriatic arthritis has follow-up scheduled.  Past medical history, past surgical history, family history and social history were all reviewed and documented in the EPIC chart.  2 children both doing well.  Retired Network engineer.  Mother deceased from asthma.  ROS:  A ROS was performed and pertinent positives and negatives are included.  Exam:  Vitals:   11/04/19 1403  BP: 122/78  Weight: 176 lb (79.8 kg)  Height: 5\' 5"  (1.651 m)   Body mass index is 29.29 kg/m.   General appearance:  Normal Thyroid:  Symmetrical, normal in size, without palpable masses or nodularity. Respiratory  Auscultation:  Clear without wheezing or rhonchi Cardiovascular  Auscultation:  Regular rate, without rubs, murmurs or gallops  Edema/varicosities:  Not grossly evident Abdominal  Soft,nontender, without masses, guarding or rebound.  Liver/spleen:  No organomegaly noted  Hernia:  None appreciated  Skin  Inspection:  Grossly normal   Breasts: Examined lying and sitting.     Right: Without masses, retractions, discharge or axillary adenopathy.     Left: Without masses, retractions, discharge or axillary adenopathy. Gentitourinary   Inguinal/mons:  Normal without inguinal adenopathy  External genitalia:  Normal  BUS/Urethra/Skene's glands:  Normal  Vagina:  Normal  Cervix:  Normal  Uterus:  normal in size, shape and contour.  Midline and mobile  Adnexa/parametria:     Rt: Without masses or tenderness.   Lt: Without masses or tenderness.  Anus and  perineum: Normal  Digital rectal exam: Normal sphincter tone without palpated masses or tenderness  Assessment/Plan:  63 y.o. MWF G2 P2 for annual exam with no complaints.  Postmenopausal Vagifem intermittent use with no bleeding Psoriatic arthritis has follow-up scheduled Asthma-primary care manages labs and meds Abdominal bloating Anxiety-rare Xanax use  Plan: Has used Vagifem in the past with good relief of vaginal dryness would like to try again Vagifem prescription, proper use given and reviewed, continue vaginal lubricants as needed.  SBEs, continue annual screening mammogram, calcium rich foods, vitamin D 2000 daily encouraged.  Reviewed importance of weightbearing and balance type exercise, encouraged to continue walking.  Sister with history of ovarian cancer has had intermittent abdominal bloating will check ultrasound will schedule.  Xanax 0.25 prescription, proper use, addictive properties reviewed aware to use sparingly.  Pap normal 2019, new screening guidelines reviewed.     Huel Cote [facility], 2:41 PM 11/04/2019

## 2019-11-05 ENCOUNTER — Encounter: Payer: PRIVATE HEALTH INSURANCE | Admitting: Women's Health

## 2019-11-05 LAB — UNLABELED: Test Ordered On Req: 17667

## 2019-11-07 LAB — CULTURE INDICATED

## 2019-11-07 LAB — PAT ID TIQ DOC: Test Affected: 17667

## 2019-11-07 LAB — URINALYSIS, COMPLETE W/RFL CULTURE
Bacteria, UA: NONE SEEN /HPF
Bilirubin Urine: NEGATIVE
Glucose, UA: NEGATIVE
Hgb urine dipstick: NEGATIVE
Hyaline Cast: NONE SEEN /LPF
Ketones, ur: NEGATIVE
Nitrites, Initial: NEGATIVE
Protein, ur: NEGATIVE
RBC / HPF: NONE SEEN /HPF (ref 0–2)
Specific Gravity, Urine: 1.013 (ref 1.001–1.03)
Squamous Epithelial / HPF: NONE SEEN /HPF (ref <=5)
pH: 5.5 (ref 5.0–8.0)

## 2019-11-07 LAB — TEST AUTHORIZATION

## 2019-11-07 LAB — URINE CULTURE
MICRO NUMBER:: 1115985
SPECIMEN QUALITY:: ADEQUATE

## 2019-11-18 ENCOUNTER — Other Ambulatory Visit: Payer: Self-pay

## 2019-11-19 ENCOUNTER — Encounter: Payer: 59 | Admitting: Family

## 2019-11-22 ENCOUNTER — Other Ambulatory Visit: Payer: Self-pay

## 2019-11-25 ENCOUNTER — Ambulatory Visit (INDEPENDENT_AMBULATORY_CARE_PROVIDER_SITE_OTHER): Payer: PRIVATE HEALTH INSURANCE

## 2019-11-25 ENCOUNTER — Other Ambulatory Visit: Payer: Self-pay

## 2019-11-25 ENCOUNTER — Encounter: Payer: Self-pay | Admitting: Women's Health

## 2019-11-25 ENCOUNTER — Ambulatory Visit: Payer: PRIVATE HEALTH INSURANCE | Admitting: Women's Health

## 2019-11-25 VITALS — BP 118/78

## 2019-11-25 DIAGNOSIS — R140 Abdominal distension (gaseous): Secondary | ICD-10-CM

## 2019-11-25 DIAGNOSIS — Z8041 Family history of malignant neoplasm of ovary: Secondary | ICD-10-CM | POA: Diagnosis not present

## 2019-11-25 DIAGNOSIS — N854 Malposition of uterus: Secondary | ICD-10-CM

## 2019-11-25 NOTE — Progress Notes (Signed)
63 year old MWF G2 P2 presents for ultrasound.  An annual exam last month reported abdominal bloating.  Sister ovarian cancer survivor. Postmenopausal no HRT with no bleeding.  Psoriatic arthritis  having infusions.  History of asthma currently no issues primary care manages.  Denies abdominal bloating today, urinary symptoms, vaginal discharge, abdominal/back pain or fever.  2019 - colonoscopy.  Exam: Appears well.  Ultrasound: Small anteverted uterus with no myometrial masses.  Thin symmetrical endometrium with no masses or thickening seen, endometrium 1.86 mm.  Both ovaries small with atrophic appearance.  No adnexal masses or free fluid.  Normal GYN ultrasound  Plan: Reviewed normality of ultrasound.  Reviewed bloating most likely related to diet.  Instructed to call if continued problems or follow-up with GI.

## 2019-11-28 ENCOUNTER — Ambulatory Visit (INDEPENDENT_AMBULATORY_CARE_PROVIDER_SITE_OTHER): Payer: PRIVATE HEALTH INSURANCE

## 2019-11-28 DIAGNOSIS — J309 Allergic rhinitis, unspecified: Secondary | ICD-10-CM | POA: Diagnosis not present

## 2019-12-04 ENCOUNTER — Encounter: Payer: 59 | Admitting: Family

## 2019-12-09 ENCOUNTER — Ambulatory Visit (INDEPENDENT_AMBULATORY_CARE_PROVIDER_SITE_OTHER): Payer: PRIVATE HEALTH INSURANCE

## 2019-12-09 DIAGNOSIS — J309 Allergic rhinitis, unspecified: Secondary | ICD-10-CM | POA: Diagnosis not present

## 2019-12-17 ENCOUNTER — Ambulatory Visit (INDEPENDENT_AMBULATORY_CARE_PROVIDER_SITE_OTHER): Payer: PRIVATE HEALTH INSURANCE

## 2019-12-17 DIAGNOSIS — J309 Allergic rhinitis, unspecified: Secondary | ICD-10-CM

## 2019-12-24 ENCOUNTER — Ambulatory Visit (INDEPENDENT_AMBULATORY_CARE_PROVIDER_SITE_OTHER): Payer: PRIVATE HEALTH INSURANCE

## 2019-12-24 DIAGNOSIS — J309 Allergic rhinitis, unspecified: Secondary | ICD-10-CM

## 2020-01-02 ENCOUNTER — Other Ambulatory Visit: Payer: Self-pay

## 2020-01-02 ENCOUNTER — Encounter: Payer: Self-pay | Admitting: Medical

## 2020-01-02 ENCOUNTER — Telehealth: Payer: Self-pay | Admitting: Medical

## 2020-01-02 ENCOUNTER — Ambulatory Visit (HOSPITAL_BASED_OUTPATIENT_CLINIC_OR_DEPARTMENT_OTHER)
Admission: RE | Admit: 2020-01-02 | Discharge: 2020-01-02 | Disposition: A | Discharge status: Home / Self Care | Payer: 59 | Source: Ambulatory Visit | Attending: Medical | Admitting: Medical

## 2020-01-02 ENCOUNTER — Ambulatory Visit: Payer: 59 | Admitting: Medical

## 2020-01-02 ENCOUNTER — Ambulatory Visit (INDEPENDENT_AMBULATORY_CARE_PROVIDER_SITE_OTHER): Payer: PRIVATE HEALTH INSURANCE

## 2020-01-02 VITALS — BP 106/78 | HR 108 | Temp 98.1°F | Resp 16 | Ht 65.0 in | Wt 178.4 lb

## 2020-01-02 DIAGNOSIS — M79671 Pain in right foot: Secondary | ICD-10-CM | POA: Diagnosis present

## 2020-01-02 DIAGNOSIS — M25571 Pain in right ankle and joints of right foot: Secondary | ICD-10-CM

## 2020-01-02 DIAGNOSIS — J309 Allergic rhinitis, unspecified: Secondary | ICD-10-CM

## 2020-01-02 DIAGNOSIS — L409 Psoriasis, unspecified: Secondary | ICD-10-CM | POA: Diagnosis not present

## 2020-01-02 DIAGNOSIS — M2550 Pain in unspecified joint: Secondary | ICD-10-CM | POA: Diagnosis not present

## 2020-01-02 LAB — CBC WITH DIFFERENTIAL/PLATELET
Basophils Absolute: 0 10*3/uL (ref 0.0–0.1)
Basophils Relative: 0.4 % (ref 0.0–3.0)
Eosinophils Absolute: 0.3 10*3/uL (ref 0.0–0.7)
Eosinophils Relative: 4.5 % (ref 0.0–5.0)
HCT: 37.4 % (ref 36.0–46.0)
Hemoglobin: 12.3 g/dL (ref 12.0–15.0)
Lymphocytes Relative: 31.3 % (ref 12.0–46.0)
Lymphs Abs: 2.3 10*3/uL (ref 0.7–4.0)
MCHC: 32.8 g/dL (ref 30.0–36.0)
MCV: 86.1 fl (ref 78.0–100.0)
Monocytes Absolute: 0.6 10*3/uL (ref 0.1–1.0)
Monocytes Relative: 8.4 % (ref 3.0–12.0)
Neutro Abs: 4 10*3/uL (ref 1.4–7.7)
Neutrophils Relative %: 55.4 % (ref 43.0–77.0)
Platelets: 287 10*3/uL (ref 150.0–400.0)
RBC: 4.35 Mil/uL (ref 3.87–5.11)
RDW: 15.2 % (ref 11.5–15.5)
WBC: 7.3 10*3/uL (ref 4.0–10.5)

## 2020-01-02 LAB — COMPREHENSIVE METABOLIC PANEL
ALT: 10 U/L (ref 0–35)
AST: 13 U/L (ref 0–37)
Albumin: 4.1 g/dL (ref 3.5–5.2)
Alkaline Phosphatase: 72 U/L (ref 39–117)
BUN: 19 mg/dL (ref 6–23)
CO2: 28 mEq/L (ref 19–32)
Calcium: 9.7 mg/dL (ref 8.4–10.5)
Chloride: 103 mEq/L (ref 96–112)
Creatinine, Ser: 0.78 mg/dL (ref 0.40–1.20)
GFR: 74.4 mL/min (ref >60.00)
Glucose, Bld: 101 mg/dL — ABNORMAL HIGH (ref 70–99)
Potassium: 4.1 mEq/L (ref 3.5–5.1)
Sodium: 139 mEq/L (ref 135–145)
Total Bilirubin: 0.4 mg/dL (ref 0.2–1.2)
Total Protein: 6.9 g/dL (ref 6.0–8.3)

## 2020-01-02 LAB — C-REACTIVE PROTEIN: CRP: <1 mg/dL (ref 0.5–20.0)

## 2020-01-02 LAB — URIC ACID: Uric Acid, Serum: 4.7 mg/dL (ref 2.4–7.0)

## 2020-01-02 LAB — SEDIMENTATION RATE: Sed Rate: 23 mm/hr (ref 0–30)

## 2020-01-02 MED ORDER — DICLOFENAC SODIUM 75 MG PO TBEC: 75.0000 mg | DELAYED_RELEASE_TABLET | Freq: Two times a day (BID) | ORAL | 0 refills | Status: DC

## 2020-01-02 NOTE — Patient Instructions (Signed)
For ankle and feet pain, I want to get xay of ankle and foot. Also get cbc, uric acid and inflammatory lab studies. Will get results then decide on med treatment.   For heel pain consider getting Dr. Darrick Grinder heel pads.  For psoriasis skin changes use steroid cream your specialist rx'd. You could supplament with otc [place] moisturizing lotion with vit E.  Follow up 7-10 days or as needed

## 2020-01-02 NOTE — Progress Notes (Signed)
Subjective:    Patient ID: Melissa Middleton, female    DOB: 1956-10-24, 64 y.o.   MRN: [id number]  HPI  Pt in for some left ankle area swelling.   Pt has hx of psoriasis. 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 Pt is on methothrexate and simponi aria.   Pt states lateral and medial aspect of  Her rt ankle is swollen over past approximate week. She states some pain to ankle. In addition some pain on bottom of her rt foot.  Pt states skin flakiness and dryness is frustrating.     Review of Systems  Constitutional: Negative for chills, fatigue and fever.  Respiratory: Negative for chest tightness, shortness of breath and wheezing.   Cardiovascular: Negative for chest pain and palpitations.  Gastrointestinal: Negative for abdominal pain.  Musculoskeletal:       Rt ankle and rt foot pain.  Skin: Positive for rash.  Hematological: Negative for adenopathy. Does not bruise/bleed easily.  Psychiatric/Behavioral: Negative for behavioral problems and decreased concentration.    Past Medical History:  Diagnosis Date  . Allergic rhinitis   . Anxiety   . Asthma   . Benign neoplasm of stomach    gastric polyps  . Bronchitis    recurrent  . Diaphragmatic hernia without mention of obstruction or gangrene   . Diverticulosis of colon (without mention of hemorrhage)   . Esophageal reflux    see GI  . GERD (gastroesophageal reflux disease)   . Insomnia   . Osteoarthritis    sees ortho-has had steroid shot in knee  . Pneumonia   . Post menopausal problems    symptoms  . Premenstrual tension syndromes    gyn uses alprazolam and spironolactone prn ofr treatment  . Psoriasis   . Psoriatic arthritis ([place])   . Sinusitis    recurrent  . Status post dilation of esophageal narrowing   . Stricture and stenosis of esophagus   . Unspecified asthma(493.90)   . Vitamin D deficiency      Social History   Socioeconomic History  . Marital status: Married    Spouse name: Not  on file  . Number of children: 2  . Years of education: Not on file  . Highest education level: Not on file  Occupational History    Employer: UNEMPLOYED    Comment: Administrative work  Tobacco Use  . Smoking status: Never Smoker  . Smokeless tobacco: Never Used  Substance and Sexual Activity  . Alcohol use: No    Alcohol/week: 0.0 standard drinks  . Drug use: No  . Sexual activity: Yes    Comment: INTERCOURSE AGE 25, SEXUAL PARTNERS LESS THAN 5  Other Topics Concern  . Not on file  Social History Narrative   Daughter age 33   Son age 39   Married   Enjoys Chief Executive Officer, Quarry manager shows   Social Determinants of Radio broadcast assistant Strain:   . Difficulty of Paying Living Expenses: Not on file  Food Insecurity:   . Worried About Charity fundraiser in the Last Year: Not on file  . Ran Out of Food in the Last Year: Not on file  Transportation Needs:   . Lack of Transportation (Medical): Not on file  . Lack of Transportation (Non-Medical): Not on file  Physical Activity:   . Days of Exercise per Week: Not on file  . Minutes of Exercise per Session: Not on file  Stress:   . Feeling of Stress :  Not on file  Social Connections:   . Frequency of Communication with Friends and Family: Not on file  . Frequency of Social Gatherings with Friends and Family: Not on file  . Attends Religious Services: Not on file  . Active Member of Clubs or Organizations: Not on file  . Attends Archivist Meetings: Not on file  . Marital Status: Not on file  Intimate Partner Violence:   . Fear of Current or Ex-Partner: Not on file  . Emotionally Abused: Not on file  . Physically Abused: Not on file  . Sexually Abused: Not on file    Past Surgical History:  Procedure Laterality Date  . CESAREAN SECTION    . ENDOMETRIAL ABLATION    . NASAL SINUS SURGERY    . TOTAL KNEE ARTHROPLASTY Right 11/2009  . TUBAL LIGATION  2004   with ablation    Family History  Problem Relation Age  of Onset  . Hypertension Mother        died from an asthma attack  . Asthma Mother   . Diabetes Father        died from sepsis  . Hypertension Father   . Arthritis Father   . Ovarian cancer Sister   . Melanoma Sister   . Hypertension Maternal Grandfather   . Heart attack Maternal Grandfather   . Diabetes Paternal Grandmother   . Arthritis Paternal Grandmother   . Breast cancer Cousin   . Migraines Neg Hx     Allergies  Allergen Reactions  . Macrodantin [Nitrofurantoin Macrocrystal] Itching and Rash  . Shellfish Allergy   . Cefuroxime Axetil Rash and Other (See Comments)    REACTION: red face/rash  . Penicillins Rash    Current Outpatient Medications on File Prior to Visit  Medication Sig Dispense Refill  . Adalimumab 40 MG/0.8ML PNKT Inject 40 mg into the skin.    Marland Kitchen albuterol (PROVENTIL HFA;VENTOLIN HFA) 108 (90 Base) MCG/ACT inhaler INHALE 2 PUFFS INTO THE LUNGS EVERY 4 (FOUR) HOURS AS NEEDED FOR WHEEZING OR SHORTNESS OF BREATH. 18 Inhaler 2  . ALPRAZolam (XANAX) 0.25 MG tablet TAKE 1 TABLET BY MOUTH AT BEDTIME AS NEEDED FOR ANXIETY 90 tablet 0  . DUOBRII 0.01-0.045 % LOTN     . EPINEPHrine (EPIPEN 2-PAK) 0.3 mg/0.3 mL IJ SOAJ injection Use as directed for severe allergic reactions 2 Device 2  . esomeprazole (NEXIUM) 40 MG capsule TAKE 1 CAPSULE BY MOUTH EVERY DAY 90 capsule 1  . Estradiol (YUVAFEM) 10 MCG TABS vaginal tablet Use vaginally 2 times per wk 24 tablet 4  . fluticasone (FLONASE) 50 MCG/ACT nasal spray TWO SPRAYS EACH NOSTRIL ONCE A DAY FOR NASAL CONGESTION OR DRAINAGE. 16 g 5  . ipratropium-albuterol (DUONEB) 0.5-2.5 (3) MG/3ML SOLN ipratropium 0.5 mg-albuterol 3 mg (2.5 mg base)/3 mL nebulization soln    . methotrexate (RHEUMATREX) 2.5 MG tablet Take 15 mg by mouth once a week.    . montelukast (SINGULAIR) 10 MG tablet TAKE 1 TABLET BY MOUTH EVERYDAY AT BEDTIME 90 tablet 1  . Secukinumab, 300 MG Dose, (COSENTYX SENSOREADY, 300 MG,) 150 MG/ML SOAJ     . SIMPONI  ARIA 50 MG/4ML SOLN injection     . spironolactone (ALDACTONE) 25 MG tablet TAKE 1 TABLET (25 MG TOTAL) BY MOUTH DAILY AS NEEDED. 90 tablet 1  . traZODone (DESYREL) 100 MG tablet TAKE 1 TABLET BY MOUTH EVERYDAY AT BEDTIME 90 tablet 0  . triamcinolone cream (KENALOG) 0.1 % Apply 1 application topically 2 (  two) times daily. 15 g 5   Current Facility-Administered Medications on File Prior to Visit  Medication Dose Route Frequency Provider Last Rate Last Admin  . 0.9 %  sodium chloride infusion  500 mL Intravenous Once [name], MD        BP 106/78 (BP Location: Left Arm, Patient Position: Sitting, Cuff Size: Normal)   Pulse (!) 108   Temp 98.1 F (36.7 C) (Temporal)   Resp 16   Ht 5\' 5"  (1.651 m)   Wt 178 lb 6.4 oz (80.9 kg)   SpO2 96%   BMI 29.69 kg/m       Objective:   Physical Exam   General- No acute distress. Pleasant patient. Neck- Full range of motion, no jvd Lungs- Clear, even and unlabored. Heart- regular rate and rhythm. Neurologic- CNII- XII grossly intact.  Rt ankle- mild to moderate swelling of lateral and medial aspect of ankle. Mild tenderness. No warmth. Rt foot- mild tender to calcaneus.  Skin- has dry flaky rash to rt heal.  Rt lower ext- negative homans signs. Calf not swolllen. Normal appearing superficial veins.     Assessment & Plan:  For ankle and feet pain, I want to get xay of ankle and foot. Also get cbc, uric acid and inflammatory lab studies. Will get results then decide on med treatment.   For heel pain consider getting Dr. Darrick Grinder heel pads.  For psoriasis skin changes use steroid cream your specialist rx'd. You could supplament with otc Palmers moisturizing lotion with vit E.  Follow up 7-10 days or as needed  30 minutes spent with patient today.  50% of time spent counseling patient on plan going forward.

## 2020-01-02 NOTE — Telephone Encounter (Signed)
Rx diclofenac sent to pt pharmacy. 

## 2020-01-04 ENCOUNTER — Telehealth: Payer: Self-pay | Admitting: Medical

## 2020-01-04 DIAGNOSIS — R768 Other specified abnormal immunological findings in serum: Secondary | ICD-10-CM

## 2020-01-04 LAB — ANTI-NUCLEAR AB-TITER (ANA TITER)
ANA TITER: 1:320 {titer} — ABNORMAL HIGH
ANA Titer 1: 1:320 {titer} — ABNORMAL HIGH

## 2020-01-04 LAB — ANA: Anti Nuclear Antibody (ANA): POSITIVE — AB

## 2020-01-04 LAB — RHEUMATOID FACTOR: Rheumatoid fact SerPl-aCnc: <14 IU/mL (ref <14)

## 2020-01-04 NOTE — Telephone Encounter (Signed)
Referral to rheumatologist placed,

## 2020-01-09 NOTE — Progress Notes (Signed)
Follow Up Note  RE: Melissa Middleton MRN: [id number] DOB: 1956/01/26 Date of Office Visit: 01/10/2020  Referring provider: Debbrah Alar, NP Primary care provider: Debbrah Alar, NP  Chief Complaint: Allergies  History of Present Illness: I had the pleasure of seeing Melissa Middleton for a follow up visit at the [facility] on 01/12/2020. She is a 64 y.o. female, who is being followed for asthma, allergic rhinitis, food allergies. Her previous allergy office visit was on 12/25/2018 with Dr. Shaune Leeks. Today is a regular follow up visit.  Asthma: Usually flares with URI symptoms. Otherwise, denies any SOB, coughing, wheezing, chest tightness, nocturnal awakenings, ER/urgent care visits or prednisone use since the last visit.  Allergic rhinitis: Patient currently on allergy injections every 4 weeks for 7 years and doing well. She would like to stop after this last vial. Has not noticed any worsening symptoms when late or due for injections.  Currently on Singulair daily and zyrtec daily.  However, if she misses a zyrtec dose then notices some nasal congestion. Takes Flonase as needed.  Food allergies: Currently avoiding peanuts, cashews and shellfish. Patient develops for a rash after ingestion.   Patient diagnosed with psoriatic arthritis and getting infusions. Concerned about whether she should get the COVID-19 vaccine or not.   Assessment and Plan: [name] is a 64 y.o. female with: Mild persistent asthma without complication Well-controlled with no flares the past year.  Today's spirometry showed mild obstruction.  May use albuterol rescue inhaler 2 puffs or nebulizer every 4 to 6 hours as needed for shortness of breath, chest tightness, coughing, and wheezing. May use albuterol rescue inhaler 2 puffs 5 to 15 minutes prior to strenuous physical activities. Monitor frequency of use.   Seasonal and perennial allergic rhinitis Past history - 2018 skin  testing was positive to grass, tree, dust mites, mold and cockroach. Interim history - patient has been on allergy immunotherapy for 7 years and would like to stop after the last vial.  She still takes daily Zyrtec and if misses a dose notices some nasal congestion.  Finish current allergy vial and then we will stop the injections.   May use over the counter antihistamines such as Zyrtec (cetirizine), Claritin (loratadine), Allegra (fexofenadine), or Xyzal (levocetirizine) daily as needed.  Continue montelukast 10mg  daily at night.   May use Flonase 1 spray per nostril twice a day as needed for nasal congestion.   Anaphylactic shock due to adverse food reaction Past history - 2018 skin testing was positive to peanuts. Interim history - no reactions, usually developed a rash.   Continue to avoid cashews, peanuts, shellfish.  For mild symptoms you can take over the counter antihistamines such as Benadryl and monitor symptoms closely. If symptoms worsen or if you have severe symptoms including breathing issues, throat closure, significant swelling, whole body hives, severe diarrhea and vomiting, lightheadedness then inject epinephrine and seek immediate medical care afterwards. Get bloodwork.   Vaccine counseling Discussed the risks and benefits of COVID-19 vaccination. Given her medical history there are no contraindication for her and I recommend that she gets vaccinated. I did advise her to follow up with her rheumatologist as well as she is on infusions for her psoriatic arthritis.   Return in about 1 year (around 01/09/2021).  Meds ordered this encounter  Medications  . albuterol (VENTOLIN HFA) 108 (90 Base) MCG/ACT inhaler    Sig: Inhale 2 puffs into the lungs every 4 (four) hours as needed for wheezing  or shortness of breath.    Dispense:  18 g    Refill:  1    Lab Orders     Allergen Profile, Shellfish     IgE Nut Prof. w/Component Rflx  Diagnostics: Spirometry:  Tracings  reviewed. Her effort: Good reproducible efforts. FVC: 2.93L FEV1: 1.87L, 73% predicted FEV1/FVC ratio: 64% Interpretation: Spirometry consistent with mild obstructive disease - slightly worse than previous.  Please see scanned spirometry results for details.  Medication List:  Current Outpatient Medications  Medication Sig Dispense Refill  . Adalimumab 40 MG/0.8ML PNKT Inject 40 mg into the skin.    Marland Kitchen albuterol (VENTOLIN HFA) 108 (90 Base) MCG/ACT inhaler Inhale 2 puffs into the lungs every 4 (four) hours as needed for wheezing or shortness of breath. 18 g 1  . ALPRAZolam (XANAX) 0.25 MG tablet TAKE 1 TABLET BY MOUTH AT BEDTIME AS NEEDED FOR ANXIETY 90 tablet 0  . diclofenac (VOLTAREN) 75 MG EC tablet Take 1 tablet (75 mg total) by mouth 2 (two) times daily. 20 tablet 0  . DUOBRII 0.01-0.045 % LOTN     . EPINEPHrine (EPIPEN 2-PAK) 0.3 mg/0.3 mL IJ SOAJ injection Use as directed for severe allergic reactions 2 Device 2  . esomeprazole (NEXIUM) 40 MG capsule TAKE 1 CAPSULE BY MOUTH EVERY DAY 90 capsule 1  . Estradiol (YUVAFEM) 10 MCG TABS vaginal tablet Use vaginally 2 times per wk 24 tablet 4  . fluticasone (FLONASE) 50 MCG/ACT nasal spray TWO SPRAYS EACH NOSTRIL ONCE A DAY FOR NASAL CONGESTION OR DRAINAGE. 16 g 5  . ipratropium-albuterol (DUONEB) 0.5-2.5 (3) MG/3ML SOLN ipratropium 0.5 mg-albuterol 3 mg (2.5 mg base)/3 mL nebulization soln    . methotrexate (RHEUMATREX) 2.5 MG tablet Take 15 mg by mouth once a week.    . montelukast (SINGULAIR) 10 MG tablet TAKE 1 TABLET BY MOUTH EVERYDAY AT BEDTIME 90 tablet 1  . Secukinumab, 300 MG Dose, (COSENTYX SENSOREADY, 300 MG,) 150 MG/ML SOAJ     . SIMPONI ARIA 50 MG/4ML SOLN injection     . spironolactone (ALDACTONE) 25 MG tablet TAKE 1 TABLET (25 MG TOTAL) BY MOUTH DAILY AS NEEDED. 90 tablet 1  . traZODone (DESYREL) 100 MG tablet TAKE 1 TABLET BY MOUTH EVERYDAY AT BEDTIME 90 tablet 0  . triamcinolone cream (KENALOG) 0.1 % Apply 1 application  topically 2 (two) times daily. (Patient not taking: Reported on 01/10/2020) 15 g 5   Current Facility-Administered Medications  Medication Dose Route Frequency Provider Last Rate Last Admin  . 0.9 %  sodium chloride infusion  500 mL Intravenous Once Doran Stabler, MD       Allergies: Allergies  Allergen Reactions  . Macrodantin [Nitrofurantoin Macrocrystal] Itching and Rash  . Shellfish Allergy   . Cefuroxime Axetil Rash and Other (See Comments)    REACTION: red face/rash  . Penicillins Rash   I reviewed her past medical history, social history, family history, and environmental history and no significant changes have been reported from her previous visit.  Review of Systems  Constitutional: Negative for appetite change, chills, fever and unexpected weight change.  HENT: Negative for congestion and rhinorrhea.   Eyes: Negative for itching.  Respiratory: Negative for cough, chest tightness, shortness of breath and wheezing.   Gastrointestinal: Negative for abdominal pain.  Skin: Negative for rash.  Allergic/Immunologic: Positive for environmental allergies and food allergies.  Neurological: Negative for headaches.   Objective: BP 110/66   Pulse 90   Temp 98.3 F (36.8 C) (  Oral)   Resp 16   Ht 5\' 5"  (1.651 m)   Wt 179 lb (81.2 kg)   SpO2 97%   BMI 29.79 kg/m  Body mass index is 29.79 kg/m. Physical Exam  Constitutional: She is oriented to person, place, and time. She appears well-developed and well-nourished.  HENT:  Head: Normocephalic and atraumatic.  Right Ear: External ear normal.  Left Ear: External ear normal.  Nose: Nose normal.  Mouth/Throat: Oropharynx is clear and moist.  Eyes: Conjunctivae and EOM are normal.  Cardiovascular: Normal rate, regular rhythm and normal heart sounds. Exam reveals no gallop and no friction rub.  No murmur heard. Pulmonary/Chest: Effort normal and breath sounds normal. She has no wheezes. She has no rales.  Musculoskeletal:      Cervical back: Neck supple.  Neurological: She is alert and oriented to person, place, and time.  Skin: Skin is warm. No rash noted.  Psychiatric: She has a normal mood and affect. Her behavior is normal.  Nursing note and vitals reviewed.  Previous notes and tests were reviewed. The plan was reviewed with the patient/family, and all questions/concerned were addressed.  It was my pleasure to see Tristynn today and participate in her care. Please feel free to contact me with any questions or concerns.  Sincerely,  Rexene Alberts, DO Allergy & Immunology  [facility] office: [phone] [facility] office: [place] office: [phone]

## 2020-01-10 ENCOUNTER — Encounter: Payer: Self-pay | Admitting: Allergy

## 2020-01-10 ENCOUNTER — Ambulatory Visit (INDEPENDENT_AMBULATORY_CARE_PROVIDER_SITE_OTHER): Payer: PRIVATE HEALTH INSURANCE | Admitting: Allergy

## 2020-01-10 ENCOUNTER — Other Ambulatory Visit: Payer: Self-pay

## 2020-01-10 VITALS — BP 110/66 | HR 90 | Temp 98.3°F | Resp 16 | Ht 65.0 in | Wt 179.0 lb

## 2020-01-10 DIAGNOSIS — Z7189 Other specified counseling: Secondary | ICD-10-CM

## 2020-01-10 DIAGNOSIS — J4530 Mild persistent asthma, uncomplicated: Secondary | ICD-10-CM

## 2020-01-10 DIAGNOSIS — Z7185 Encounter for immunization safety counseling: Secondary | ICD-10-CM

## 2020-01-10 DIAGNOSIS — T7800XD Anaphylactic reaction due to unspecified food, subsequent encounter: Secondary | ICD-10-CM

## 2020-01-10 DIAGNOSIS — J302 Other seasonal allergic rhinitis: Secondary | ICD-10-CM

## 2020-01-10 DIAGNOSIS — J3089 Other allergic rhinitis: Secondary | ICD-10-CM | POA: Diagnosis not present

## 2020-01-10 MED ORDER — ALBUTEROL SULFATE HFA 108 (90 BASE) MCG/ACT IN AERS: 2.0000 | INHALATION_SPRAY | RESPIRATORY_TRACT | 1 refills | Status: AC | PRN

## 2020-01-10 NOTE — Patient Instructions (Addendum)
Asthma:  May use albuterol rescue inhaler 2 puffs or nebulizer every 4 to 6 hours as needed for shortness of breath, chest tightness, coughing, and wheezing. May use albuterol rescue inhaler 2 puffs 5 to 15 minutes prior to strenuous physical activities. Monitor frequency of use.   Environmental allergies:  Finish current allergy vial and then we will stop the injections.   May use over the counter antihistamines such as Zyrtec (cetirizine), Claritin (loratadine), Allegra (fexofenadine), or Xyzal (levocetirizine) daily as needed.  Continue montelukast 10mg  daily at night.   May use Flonase 1 spray per nostril twice a day as needed for nasal congestion.   Food allergies:  Continue to avoid cashews, peanuts, shellfish.  For mild symptoms you can take over the counter antihistamines such as Benadryl and monitor symptoms closely. If symptoms worsen or if you have severe symptoms including breathing issues, throat closure, significant swelling, whole body hives, severe diarrhea and vomiting, lightheadedness then inject epinephrine and seek immediate medical care afterwards. Get bloodwork.   Follow up in 1 year or sooner if needed.

## 2020-01-12 ENCOUNTER — Encounter: Payer: Self-pay | Admitting: Allergy

## 2020-01-12 DIAGNOSIS — Z7189 Other specified counseling: Secondary | ICD-10-CM | POA: Insufficient documentation

## 2020-01-12 DIAGNOSIS — Z7185 Encounter for immunization safety counseling: Secondary | ICD-10-CM | POA: Insufficient documentation

## 2020-01-12 NOTE — Assessment & Plan Note (Signed)
Discussed the risks and benefits of COVID-19 vaccination. Given her medical history there are no contraindication for her and I recommend that she gets vaccinated. I did advise her to follow up with her rheumatologist as well as she is on infusions for her psoriatic arthritis.

## 2020-01-12 NOTE — Assessment & Plan Note (Signed)
Past history - 2018 skin testing was positive to peanuts. Interim history - no reactions, usually developed a rash.   Continue to avoid cashews, peanuts, shellfish.  For mild symptoms you can take over the counter antihistamines such as Benadryl and monitor symptoms closely. If symptoms worsen or if you have severe symptoms including breathing issues, throat closure, significant swelling, whole body hives, severe diarrhea and vomiting, lightheadedness then inject epinephrine and seek immediate medical care afterwards. Get bloodwork.

## 2020-01-12 NOTE — Assessment & Plan Note (Addendum)
Past history - 2018 skin testing was positive to grass, tree, dust mites, mold and cockroach. Interim history - patient has been on allergy immunotherapy for 7 years and would like to stop after the last vial.  She still takes daily Zyrtec and if misses a dose notices some nasal congestion.  Finish current allergy vial and then we will stop the injections.   May use over the counter antihistamines such as Zyrtec (cetirizine), Claritin (loratadine), Allegra (fexofenadine), or Xyzal (levocetirizine) daily as needed.  Continue montelukast 10mg  daily at night.   May use Flonase 1 spray per nostril twice a day as needed for nasal congestion.

## 2020-01-12 NOTE — Assessment & Plan Note (Addendum)
Well-controlled with no flares the past year.  Today's spirometry showed mild obstruction.  May use albuterol rescue inhaler 2 puffs or nebulizer every 4 to 6 hours as needed for shortness of breath, chest tightness, coughing, and wheezing. May use albuterol rescue inhaler 2 puffs 5 to 15 minutes prior to strenuous physical activities. Monitor frequency of use.

## 2020-01-13 ENCOUNTER — Telehealth: Payer: Self-pay | Admitting: Allergy

## 2020-01-13 DIAGNOSIS — Z7185 Encounter for immunization safety counseling: Secondary | ICD-10-CM

## 2020-01-13 DIAGNOSIS — Z7189 Other specified counseling: Secondary | ICD-10-CM

## 2020-01-13 NOTE — Telephone Encounter (Signed)
I have printed out the lab order and will have it waiting for pt to pick up

## 2020-01-13 NOTE — Telephone Encounter (Signed)
Please advise on this.  

## 2020-01-13 NOTE — Telephone Encounter (Signed)
That's fine. I'll add the order. She can pick up. I just don't know if her insurance will cover the bloodwork.

## 2020-01-13 NOTE — Telephone Encounter (Signed)
Pt called in asking if she could have the Covid Antibody test added to her lab orders

## 2020-01-13 NOTE — Telephone Encounter (Signed)
Pt informed and coming to pick up lab order

## 2020-01-14 LAB — SARS-COV-2 ANTIBODIES: SARS-CoV-2 Antibodies: NEGATIVE

## 2020-01-16 LAB — IGE NUT PROF. W/COMPONENT RFLX
F017-IgE Hazelnut (Filbert): 0.13 kU/L — AB
F018-IgE Brazil Nut: <0.1 kU/L
F020-IgE Almond: <0.1 kU/L
F202-IgE Cashew Nut: <0.1 kU/L
F203-IgE Pistachio Nut: <0.1 kU/L
F256-IgE Walnut: <0.1 kU/L
Macadamia Nut, IgE: <0.1 kU/L
Peanut, IgE: 0.52 kU/L — AB
Pecan Nut IgE: <0.1 kU/L

## 2020-01-16 LAB — PEANUT COMPONENTS
F352-IgE Ara h 8: <0.1 kU/L
F422-IgE Ara h 1: <0.1 kU/L
F423-IgE Ara h 2: <0.1 kU/L
F424-IgE Ara h 3: <0.1 kU/L
F427-IgE Ara h 9: <0.1 kU/L
F447-IgE Ara h 6: <0.1 kU/L

## 2020-01-16 LAB — PANEL 604726
Cor A 1 IgE: 0.12 kU/L — AB
Cor A 14 IgE: <0.1 kU/L
Cor A 8 IgE: <0.1 kU/L
Cor A 9 IgE: <0.1 kU/L

## 2020-01-16 LAB — ALLERGEN COMPONENT COMMENTS

## 2020-01-16 LAB — ALLERGEN PROFILE, SHELLFISH
Clam IgE: <0.1 kU/L
F023-IgE Crab: <0.1 kU/L
F080-IgE Lobster: 0.4 kU/L — AB
F290-IgE Oyster: <0.1 kU/L
Scallop IgE: <0.1 kU/L
Shrimp IgE: <0.1 kU/L

## 2020-01-23 ENCOUNTER — Other Ambulatory Visit: Payer: Self-pay

## 2020-01-24 ENCOUNTER — Other Ambulatory Visit: Payer: Self-pay

## 2020-01-24 ENCOUNTER — Ambulatory Visit (INDEPENDENT_AMBULATORY_CARE_PROVIDER_SITE_OTHER): Payer: 59 | Admitting: Family

## 2020-01-24 VITALS — BP 112/71 | HR 93 | Temp 96.5°F | Resp 16 | Ht 65.0 in | Wt 178.0 lb

## 2020-01-24 DIAGNOSIS — Z0000 Encounter for general adult medical examination without abnormal findings: Secondary | ICD-10-CM | POA: Diagnosis not present

## 2020-01-24 DIAGNOSIS — E348 Other specified endocrine disorders: Secondary | ICD-10-CM

## 2020-01-24 NOTE — Patient Instructions (Signed)

## 2020-01-24 NOTE — Progress Notes (Signed)
Subjective:    Patient ID: Melissa Middleton, female    DOB: 03-08-1956, 64 y.o.   MRN: [id number]  HPI  Patient is a 64 yr old female who presents today for follow up  Immunizations:  Flu shot up to date, shingrix, Tdap up to date Diet: healthy Exercise: walks Colonoscopy: 01/10/18 Dexa: 10/17/16 Pap Smear: 10/30/18 Mammogram: 10/10/19 Review of Systems  Constitutional: Negative for unexpected weight change.  HENT: Negative for hearing loss and rhinorrhea.   Eyes: Negative for visual disturbance.  Respiratory: Negative for cough and shortness of breath.   Cardiovascular: Negative for chest pain.  Gastrointestinal: Negative for constipation and diarrhea.  Genitourinary: Negative for dysuria and frequency.  Musculoskeletal: Positive for arthralgias.  Skin:       Chronic psoriasis rash on feet  Allergic/Immunologic: Positive for food allergies.  Neurological: Negative for headaches.  Hematological: Negative for adenopathy.  Psychiatric/Behavioral:       Denies depression/anxiety   Past Medical History:  Diagnosis Date  . Allergic rhinitis   . Anxiety   . Asthma   . Benign neoplasm of stomach    gastric polyps  . Bronchitis    recurrent  . Diaphragmatic hernia without mention of obstruction or gangrene   . Diverticulosis of colon (without mention of hemorrhage)   . Esophageal reflux    see GI  . GERD (gastroesophageal reflux disease)   . Insomnia   . Osteoarthritis    sees ortho-has had steroid shot in knee  . Pneumonia   . Post menopausal problems    symptoms  . Premenstrual tension syndromes    gyn uses alprazolam and spironolactone prn ofr treatment  . Psoriasis   . Psoriatic arthritis ([place])   . Sinusitis    recurrent  . Status post dilation of esophageal narrowing   . Stricture and stenosis of esophagus   . Unspecified asthma(493.90)   . Vitamin D deficiency      Social History   Socioeconomic History  . Marital status: Married    Spouse name: Not  on file  . Number of children: 2  . Years of education: Not on file  . Highest education level: Not on file  Occupational History    Employer: UNEMPLOYED    Comment: Administrative work  Tobacco Use  . Smoking status: Never Smoker  . Smokeless tobacco: Never Used  Substance and Sexual Activity  . Alcohol use: No    Alcohol/week: 0.0 standard drinks  . Drug use: No  . Sexual activity: Yes    Comment: INTERCOURSE AGE 30, SEXUAL PARTNERS LESS THAN 5  Other Topics Concern  . Not on file  Social History Narrative   Daughter age 1   Son age 26   Married   Enjoys Chief Executive Officer, Quarry manager shows   Social Determinants of Radio broadcast assistant Strain:   . Difficulty of Paying Living Expenses: Not on file  Food Insecurity:   . Worried About Charity fundraiser in the Last Year: Not on file  . Ran Out of Food in the Last Year: Not on file  Transportation Needs:   . Lack of Transportation (Medical): Not on file  . Lack of Transportation (Non-Medical): Not on file  Physical Activity:   . Days of Exercise per Week: Not on file  . Minutes of Exercise per Session: Not on file  Stress:   . Feeling of Stress : Not on file  Social Connections:   . Frequency of Communication with Friends and  Family: Not on file  . Frequency of Social Gatherings with Friends and Family: Not on file  . Attends Religious Services: Not on file  . Active Member of Clubs or Organizations: Not on file  . Attends Archivist Meetings: Not on file  . Marital Status: Not on file  Intimate Partner Violence:   . Fear of Current or Ex-Partner: Not on file  . Emotionally Abused: Not on file  . Physically Abused: Not on file  . Sexually Abused: Not on file    Past Surgical History:  Procedure Laterality Date  . CESAREAN SECTION    . ENDOMETRIAL ABLATION    . NASAL SINUS SURGERY    . TOTAL KNEE ARTHROPLASTY Right 11/2009  . TUBAL LIGATION  2004   with ablation    Family History  Problem Relation Age  of Onset  . Hypertension Mother        died from an asthma attack  . Asthma Mother   . Diabetes Father        died from sepsis  . Hypertension Father   . Arthritis Father   . Ovarian cancer Sister   . Melanoma Sister   . Hypertension Maternal Grandfather   . Heart attack Maternal Grandfather   . Diabetes Paternal Grandmother   . Arthritis Paternal Grandmother   . Breast cancer Cousin   . Migraines Neg Hx     Allergies  Allergen Reactions  . Macrodantin [Nitrofurantoin Macrocrystal] Itching and Rash  . Shellfish Allergy   . Cefuroxime Axetil Rash and Other (See Comments)    REACTION: red face/rash  . Penicillins Rash    Current Outpatient Medications on File Prior to Visit  Medication Sig Dispense Refill  . albuterol (VENTOLIN HFA) 108 (90 Base) MCG/ACT inhaler Inhale 2 puffs into the lungs every 4 (four) hours as needed for wheezing or shortness of breath. 18 g 1  . ALPRAZolam (XANAX) 0.25 MG tablet TAKE 1 TABLET BY MOUTH AT BEDTIME AS NEEDED FOR ANXIETY 90 tablet 0  . diclofenac (VOLTAREN) 75 MG EC tablet Take 1 tablet (75 mg total) by mouth 2 (two) times daily. 20 tablet 0  . DUOBRII 0.01-0.045 % LOTN     . EPINEPHrine (EPIPEN 2-PAK) 0.3 mg/0.3 mL IJ SOAJ injection Use as directed for severe allergic reactions 2 Device 2  . esomeprazole (NEXIUM) 40 MG capsule TAKE 1 CAPSULE BY MOUTH EVERY DAY 90 capsule 1  . Estradiol (YUVAFEM) 10 MCG TABS vaginal tablet Use vaginally 2 times per wk 24 tablet 4  . fluticasone (FLONASE) 50 MCG/ACT nasal spray TWO SPRAYS EACH NOSTRIL ONCE A DAY FOR NASAL CONGESTION OR DRAINAGE. 16 g 5  . ipratropium-albuterol (DUONEB) 0.5-2.5 (3) MG/3ML SOLN ipratropium 0.5 mg-albuterol 3 mg (2.5 mg base)/3 mL nebulization soln    . methotrexate (RHEUMATREX) 2.5 MG tablet Take 15 mg by mouth once a week.    . montelukast (SINGULAIR) 10 MG tablet TAKE 1 TABLET BY MOUTH EVERYDAY AT BEDTIME 90 tablet 1  . SIMPONI ARIA 50 MG/4ML SOLN injection     .  spironolactone (ALDACTONE) 25 MG tablet TAKE 1 TABLET (25 MG TOTAL) BY MOUTH DAILY AS NEEDED. 90 tablet 1  . traZODone (DESYREL) 100 MG tablet TAKE 1 TABLET BY MOUTH EVERYDAY AT BEDTIME 90 tablet 0  . triamcinolone cream (KENALOG) 0.1 % Apply 1 application topically 2 (two) times daily. 15 g 5   Current Facility-Administered Medications on File Prior to Visit  Medication Dose Route Frequency Provider Last  Rate Last Admin  . 0.9 %  sodium chloride infusion  500 mL Intravenous Once [name], MD        BP 112/71 (BP Location: Right Arm, Patient Position: Sitting, Cuff Size: Small)   Pulse 93   Temp (!) 96.5 F (35.8 C) (Temporal)   Resp 16   Ht 5\' 5"  (1.651 m)   Wt 178 lb (80.7 kg)   SpO2 99%   BMI 29.62 kg/m       Objective:   Physical Exam  Physical Exam  Constitutional: She is oriented to person, place, and time. She appears well-developed and well-nourished. No distress.  HENT:  Head: Normocephalic and atraumatic.  Right Ear: Tympanic membrane and ear canal normal.  Left Ear: Tympanic membrane and ear canal normal.  Mouth/Throat: not examined- pt wearing mask.  Eyes: Pupils are equal, round, and reactive to light. No scleral icterus.  Neck: Normal range of motion. No thyromegaly present.  Cardiovascular: Normal rate and regular rhythm.   No murmur heard. Pulmonary/Chest: Effort normal and breath sounds normal. No respiratory distress. He has no wheezes. She has no rales. She exhibits no tenderness.  Abdominal: Soft. Bowel sounds are normal. She exhibits no distension and no mass. There is no tenderness. There is no rebound and no guarding.  Musculoskeletal: She exhibits no edema.  Lymphadenopathy:    She has no cervical adenopathy.  Neurological: She is alert and oriented to person, place, and time. She has normal patellar reflexes. She exhibits normal muscle tone. Coordination normal.  Skin: Skin is warm and dry. some dry peeling skin noted bilateral  feet Psychiatric: She has a normal mood and affect. Her behavior is normal. Judgment and thought content normal.  Breast/pelvic: deferred       Assessment & Plan:   Preventative care- encouraged pt to continue healthy diet and regular exercise. Obtain lipid panel and TSH- other labs are up to date. She would like to repeat bone density. Pap/mammo up to date (sees GYN). [place] up to date.   This visit occurred during the SARS-CoV-2 public health emergency.  Safety protocols were in place, including screening questions prior to the visit, additional usage of staff PPE, and extensive cleaning of exam room while observing appropriate contact time as indicated for disinfecting solutions.       Assessment & Plan:

## 2020-01-25 LAB — LIPID PANEL
Cholesterol: 198 mg/dL (ref <200)
HDL: 57 mg/dL (ref >=50)
LDL Cholesterol (Calc): 114 mg/dL (calc) — ABNORMAL HIGH
Non-HDL Cholesterol (Calc): 141 mg/dL (calc) — ABNORMAL HIGH (ref <130)
Total CHOL/HDL Ratio: 3.5 (calc) (ref <5.0)
Triglycerides: 155 mg/dL — ABNORMAL HIGH (ref <150)

## 2020-01-25 LAB — TSH: TSH: 1.44 mIU/L (ref 0.40–4.50)

## 2020-01-30 ENCOUNTER — Other Ambulatory Visit: Payer: Self-pay | Admitting: Family

## 2020-02-05 ENCOUNTER — Ambulatory Visit (INDEPENDENT_AMBULATORY_CARE_PROVIDER_SITE_OTHER): Payer: 59 | Admitting: *Deleted

## 2020-02-05 ENCOUNTER — Other Ambulatory Visit: Payer: Self-pay | Admitting: Family

## 2020-02-05 DIAGNOSIS — J309 Allergic rhinitis, unspecified: Secondary | ICD-10-CM

## 2020-03-04 ENCOUNTER — Ambulatory Visit (INDEPENDENT_AMBULATORY_CARE_PROVIDER_SITE_OTHER): Payer: 59

## 2020-03-04 DIAGNOSIS — J309 Allergic rhinitis, unspecified: Secondary | ICD-10-CM | POA: Diagnosis not present

## 2020-03-13 ENCOUNTER — Other Ambulatory Visit: Payer: Self-pay | Admitting: Women's Health

## 2020-03-13 NOTE — Telephone Encounter (Signed)
Last filled on 11/04/19 90 day supply

## 2020-03-13 NOTE — Telephone Encounter (Signed)
Rx called in 

## 2020-03-13 NOTE — Telephone Encounter (Signed)
Ok for refill? 

## 2020-03-16 ENCOUNTER — Other Ambulatory Visit: Payer: Self-pay | Admitting: Family

## 2020-03-21 ENCOUNTER — Ambulatory Visit: Payer: 59 | Attending: Internal Medicine

## 2020-03-21 DIAGNOSIS — Z23 Encounter for immunization: Secondary | ICD-10-CM

## 2020-03-21 NOTE — Progress Notes (Signed)
   Covid-19 Vaccination Clinic  Name:  Melissa Middleton    MRN: [id number] DOB: 14-Jul-1956  03/21/2020  [name] was observed post Covid-19 immunization for 15 minutes without incident. She was provided with Vaccine Information Sheet and instruction to access the V-Safe system.   [name] was instructed to call 911 with any severe reactions post vaccine: Marland Kitchen Difficulty breathing  . Swelling of face and throat  . A fast heartbeat  . A bad rash all over body  . Dizziness and weakness   Immunizations Administered    Name Date Dose VIS Date Route   Pfizer COVID-19 Vaccine 03/21/2020  4:11 PM 0.3 mL 11/29/2019 Intramuscular   Manufacturer: [place]   Lot: [id number]   [place]: [facility]  Name:  Melissa Middleton    MRN: [id number] DOB: Apr 17, 1956  03/21/2020  [name] was observed post Covid-19 immunization for 15 minutes without incident. She was provided with Vaccine Information Sheet and instruction to access the V-Safe system.   [name] was instructed to call 911 with any severe reactions post vaccine: Marland Kitchen Difficulty breathing  . Swelling of face and throat  . A fast heartbeat  . A bad rash all over body  . Dizziness and weakness   Immunizations Administered    Name Date Dose VIS Date Route   Pfizer COVID-19 Vaccine 03/21/2020  4:11 PM 0.3 mL 11/29/2019 Intramuscular   Manufacturer: [place]   Lot: [id number]   [place]: [id number]

## 2020-04-13 ENCOUNTER — Telehealth: Payer: Self-pay | Admitting: Family

## 2020-04-13 NOTE — Telephone Encounter (Signed)
Hello, Patient states she is beginning to have more medical concerns and feels like she should see a doctor. Patient is requesting to TOC frm In[facility]  to [place]. I made no Guarantee to patient. However she insisted on message being sent.

## 2020-04-13 NOTE — Telephone Encounter (Signed)
Patient is upset with Dr. Serita Sheller decisions of not accepting tansfer of care. She stated that if he wont see her she will be pulling her acct and going else where for care. She wanted message sent to you so that you can overturn his decision. Please contact patient. Thanks

## 2020-04-13 NOTE — Telephone Encounter (Signed)
OK with me.

## 2020-04-13 NOTE — Telephone Encounter (Signed)
In general, I'm not taking transfers any longer. Ty.

## 2020-04-14 ENCOUNTER — Ambulatory Visit: Payer: 59

## 2020-04-20 ENCOUNTER — Ambulatory Visit: Payer: 59 | Attending: Internal Medicine

## 2020-04-20 DIAGNOSIS — Z23 Encounter for immunization: Secondary | ICD-10-CM

## 2020-04-20 NOTE — Progress Notes (Signed)
   Covid-19 Vaccination Clinic  Name:  [name]    MRN: [id number] DOB: 14-Sep-1956  04/20/2020  [name] was observed post Covid-19 immunization for 15 minutes without incident. She was provided with Vaccine Information Sheet and instruction to access the V-Safe system.   [name] was instructed to call 911 with any severe reactions post vaccine: Marland Kitchen Difficulty breathing  . Swelling of face and throat  . A fast heartbeat  . A bad rash all over body  . Dizziness and weakness   Immunizations Administered    Name Date Dose VIS Date Route   Pfizer COVID-19 Vaccine 04/20/2020  3:15 PM 0.3 mL 02/12/2019 Intramuscular   Manufacturer: [place]   Lot: [id number]   [place]: [id number]

## 2020-04-23 ENCOUNTER — Telehealth: Payer: Self-pay

## 2020-04-23 NOTE — Telephone Encounter (Signed)
Patient is still waiting on a call back to discuss her denial of her  TOC Please give the patient a call back at [phone]

## 2020-04-25 IMAGING — MG DIGITAL SCREENING BILAT W/ TOMO W/ CAD
6 of 12 series · 6 of 36 positions shown · non-contrast
Comparison: Previous exam(s).

CLINICAL DATA: Screening.

EXAM:
DIGITAL SCREENING BILATERAL MAMMOGRAM WITH TOMO AND CAD

[R CC synth-2D (1 of 2)]
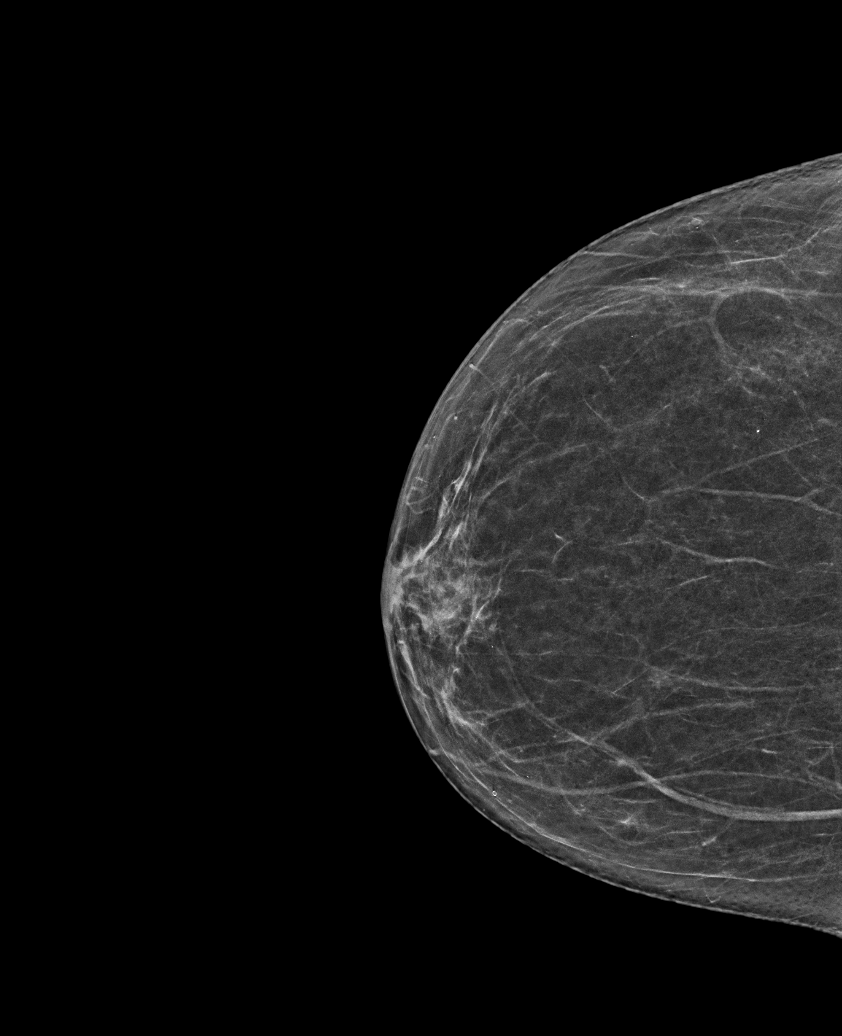

[L CC synth-2D]
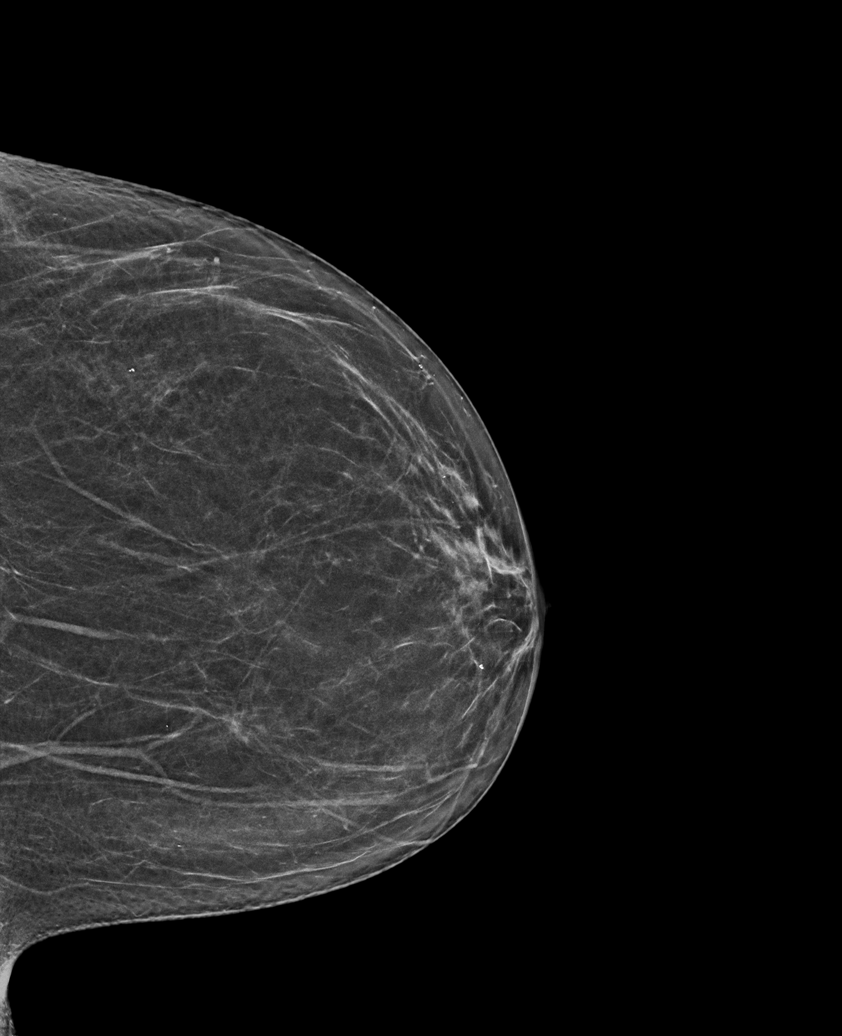

[L MLO synth-2D]
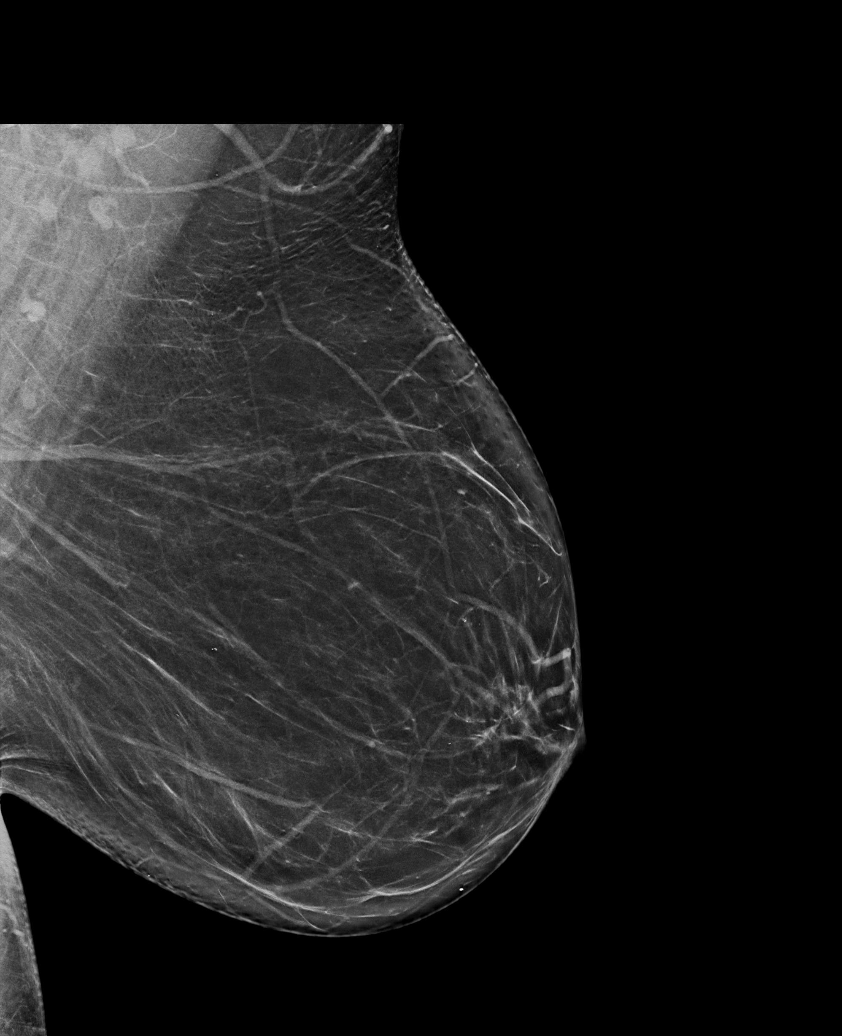

[R CC synth-2D (2 of 2)]
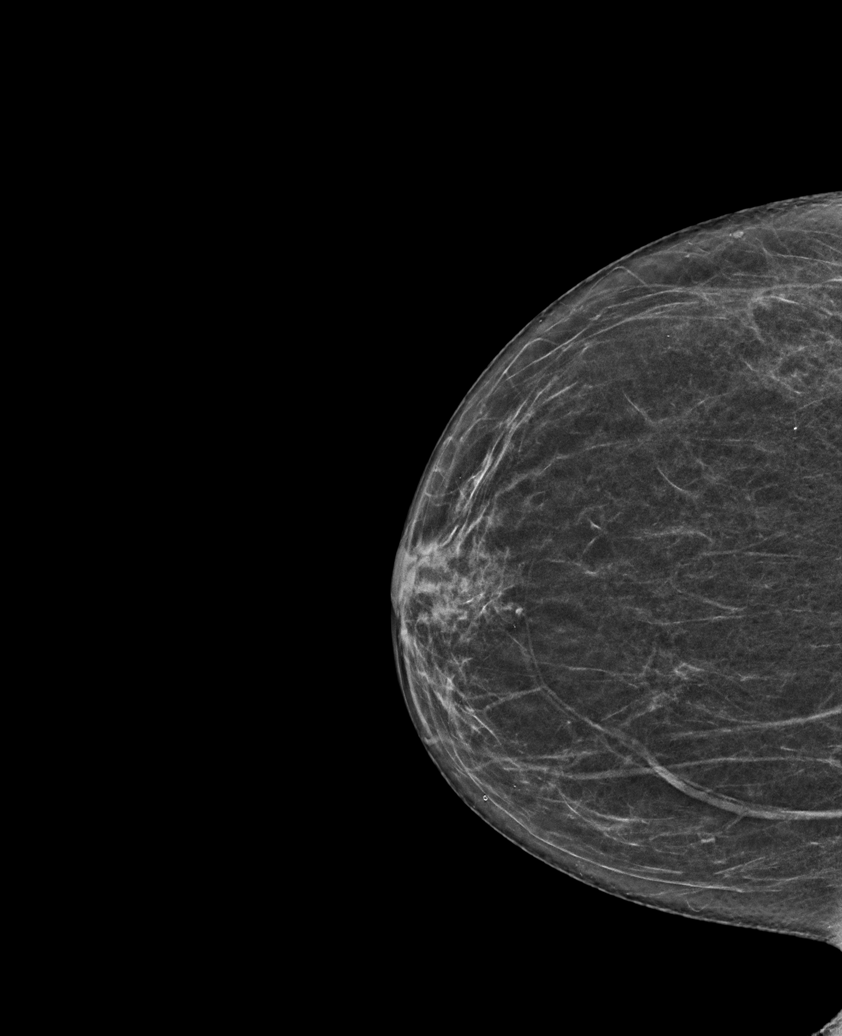

[R MLO synth-2D (1 of 2)]
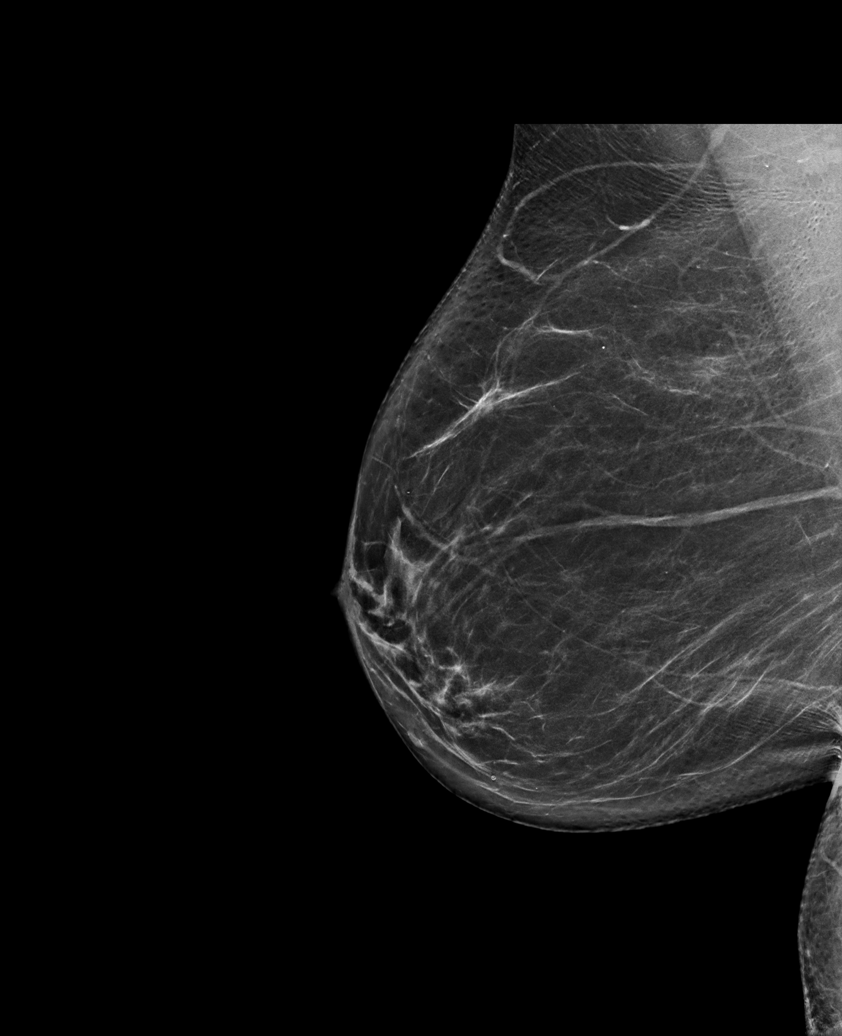

[R MLO synth-2D (2 of 2)]
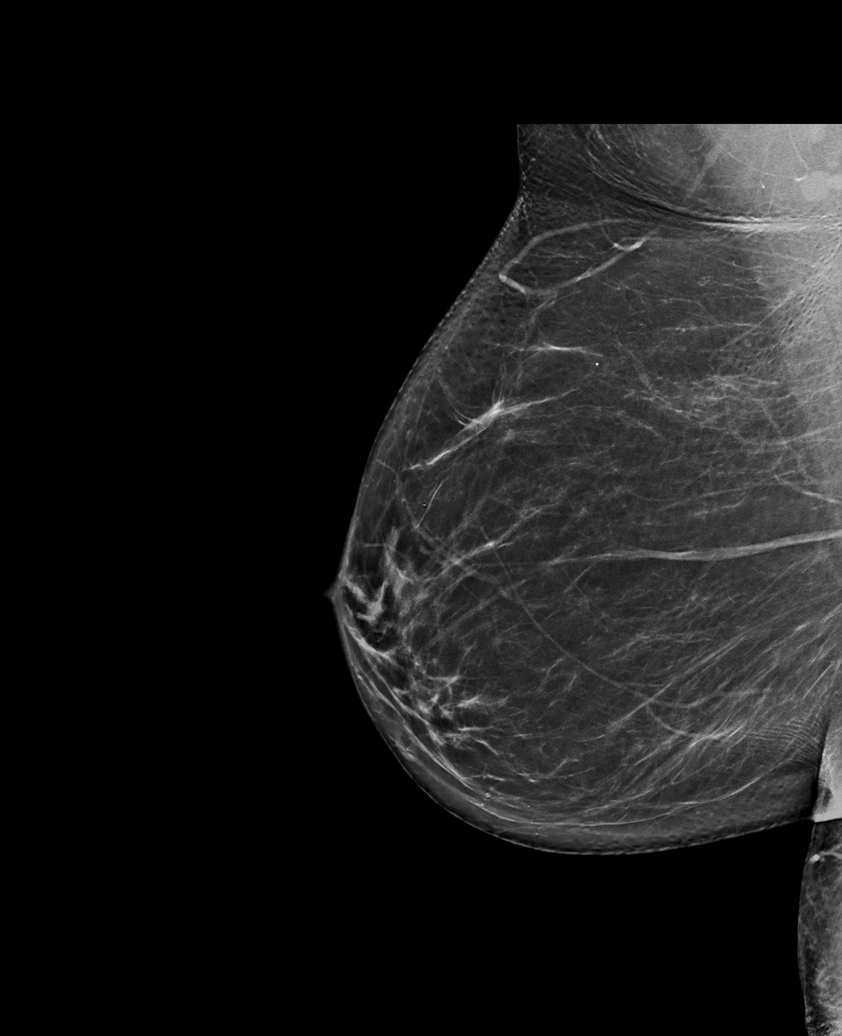

[6 of 36 positions shown; findings below may reference images not displayed]

ACR Breast Density Category b: There are scattered areas of
fibroglandular density.
FINDINGS: There are no findings suspicious for malignancy. Images were
processed with CAD.
IMPRESSION: No mammographic evidence of malignancy. A result letter of this
screening mammogram will be mailed directly to the patient.

RECOMMENDATION:
Screening mammogram in one year. (Code:[id number])

BI-RADS CATEGORY  1: Negative.

## 2020-04-28 NOTE — Telephone Encounter (Signed)
Spoke with patient and apologized for delay in getting back with her. Patient stated that she has sent her sister and brother-in-law as well as her husband and her neighbors to all see Dr. Nani Ravens and she felt slighted that by after speaking so highly of him that he would not agree to take her. Asked if it was in previous communication that her husband, sister, brother-in-law and friends were all referred by her to Dr. Nani Ravens and was told that no, that information had not been shared with him. Patient stated her sister and brother-in-law were in the office last week and Dr. Nani Ravens did agree to see Melissa Middleton as her new provider. Patient was very complementary of our office and very pleased that she's able to stay at our practice.

## 2020-06-02 ENCOUNTER — Encounter: Payer: Self-pay | Admitting: Family Medicine

## 2020-06-02 ENCOUNTER — Other Ambulatory Visit: Payer: Self-pay

## 2020-06-02 ENCOUNTER — Ambulatory Visit: Payer: 59 | Admitting: Family Medicine

## 2020-06-02 VITALS — BP 120/77 | HR 106 | Temp 96.4°F | Ht 65.0 in | Wt 182.1 lb

## 2020-06-02 DIAGNOSIS — H938X2 Other specified disorders of left ear: Secondary | ICD-10-CM

## 2020-06-02 NOTE — Progress Notes (Signed)
Chief Complaint  Patient presents with  . New Patient (Initial Visit)    transfer of care     Subjective: Patient is a 64 y.o. female here for TOC.  Pt starting to have fullness on the L ear. Has been bothering over past 4 d. Went to UC and was rx'd Cipro drops. She was also rx'd doxycycline for a sinus infection. She has ENT appt tomorrow.   Past Medical History:  Diagnosis Date  . Allergic rhinitis   . Anxiety   . Asthma   . Benign neoplasm of stomach    gastric polyps  . Diaphragmatic hernia without mention of obstruction or gangrene   . Diverticulosis of colon (without mention of hemorrhage)   . Esophageal reflux    see GI  . GERD (gastroesophageal reflux disease)   . Insomnia   . Osteoarthritis    sees ortho-has had steroid shot in knee  . Post menopausal problems    symptoms  . Premenstrual tension syndromes    gyn uses alprazolam and spironolactone prn ofr treatment  . Psoriasis   . Psoriatic arthritis ([place])   . Vitamin D deficiency     Objective: BP 120/77 (BP Location: Left Arm, Patient Position: Sitting, Cuff Size: Normal)   Pulse (!) 106   Temp (!) 96.4 F (35.8 C) (Temporal)   Ht 5\' 5"  (1.651 m)   Wt 182 lb 2 oz (82.6 kg)   SpO2 96%   BMI 30.31 kg/m  General: Awake, appears stated age HEENT: MMM, EOMi; R ear neg; there is wax smeared on L TM, canal otherwise unremarkable; nares patent w/o rhinorrhea Lungs: No accessory muscle use Psych: Age appropriate judgment and insight, normal affect and mood   Assessment and Plan: Ear fullness, left  Home wax maintenance discussed. ENT appt tomorrow AM, would lean towards seeing them for removal since cerumen is right against TM.  F/u in 6 mo for med ck.  The patient voiced understanding and agreement to the plan.  [place], DO 06/02/20  2:14 PM

## 2020-06-02 NOTE — Patient Instructions (Signed)
OK to use Debrox (peroxide) in the ear to loosen up wax. Also recommend using a bulb syringe (for removing boogers from baby's noses) to flush through warm water. Do not use Q-tips as this can impact wax further.  I would keep your appointment tomorrow with ENT.  Let us know if you need anything.

## 2020-06-04 ENCOUNTER — Ambulatory Visit (HOSPITAL_BASED_OUTPATIENT_CLINIC_OR_DEPARTMENT_OTHER)
Admission: RE | Admit: 2020-06-04 | Discharge: 2020-06-04 | Disposition: A | Discharge status: Home / Self Care | Payer: 59 | Source: Ambulatory Visit | Attending: Family | Admitting: Family

## 2020-06-04 ENCOUNTER — Other Ambulatory Visit: Payer: Self-pay

## 2020-06-04 DIAGNOSIS — E348 Other specified endocrine disorders: Secondary | ICD-10-CM | POA: Diagnosis present

## 2020-06-05 ENCOUNTER — Encounter: Payer: Self-pay | Admitting: Family

## 2020-06-05 NOTE — Telephone Encounter (Signed)
Transfer OK with me.

## 2020-08-05 ENCOUNTER — Other Ambulatory Visit: Payer: Self-pay | Admitting: Family

## 2020-08-12 ENCOUNTER — Other Ambulatory Visit: Payer: Self-pay

## 2020-08-12 MED ORDER — ALPRAZOLAM 0.25 MG PO TABS: ORAL_TABLET | ORAL | 0 refills | Status: DC

## 2020-08-12 NOTE — Telephone Encounter (Signed)
CE scheduled 11/04/20 with TF.

## 2020-09-10 ENCOUNTER — Other Ambulatory Visit: Payer: Self-pay | Admitting: Family

## 2020-10-22 ENCOUNTER — Other Ambulatory Visit: Payer: Self-pay | Admitting: Family

## 2020-10-27 ENCOUNTER — Telehealth: Payer: Self-pay | Admitting: Family Medicine

## 2020-10-27 ENCOUNTER — Ambulatory Visit: Payer: 59 | Admitting: Family Medicine

## 2020-10-27 ENCOUNTER — Ambulatory Visit (HOSPITAL_BASED_OUTPATIENT_CLINIC_OR_DEPARTMENT_OTHER)
Admission: RE | Admit: 2020-10-27 | Discharge: 2020-10-27 | Disposition: A | Discharge status: Home / Self Care | Payer: 59 | Source: Ambulatory Visit | Attending: Family Medicine | Admitting: Family Medicine

## 2020-10-27 ENCOUNTER — Other Ambulatory Visit: Payer: Self-pay

## 2020-10-27 ENCOUNTER — Encounter: Payer: Self-pay | Admitting: Family Medicine

## 2020-10-27 VITALS — BP 140/78 | HR 104 | Temp 99.7°F | Ht 65.0 in | Wt 182.4 lb

## 2020-10-27 DIAGNOSIS — R5383 Other fatigue: Secondary | ICD-10-CM

## 2020-10-27 DIAGNOSIS — M4004 Postural kyphosis, thoracic region: Secondary | ICD-10-CM | POA: Insufficient documentation

## 2020-10-27 DIAGNOSIS — R0600 Dyspnea, unspecified: Secondary | ICD-10-CM | POA: Insufficient documentation

## 2020-10-27 DIAGNOSIS — E559 Vitamin D deficiency, unspecified: Secondary | ICD-10-CM | POA: Diagnosis not present

## 2020-10-27 DIAGNOSIS — M1991 Primary osteoarthritis, unspecified site: Secondary | ICD-10-CM | POA: Diagnosis not present

## 2020-10-27 LAB — CBC
HCT: 35.7 % — ABNORMAL LOW (ref 36.0–46.0)
Hemoglobin: 11.5 g/dL — ABNORMAL LOW (ref 12.0–15.0)
MCHC: 32.2 g/dL (ref 30.0–36.0)
MCV: 84.7 fl (ref 78.0–100.0)
Platelets: 263 10*3/uL (ref 150.0–400.0)
RBC: 4.22 Mil/uL (ref 3.87–5.11)
RDW: 15.5 % (ref 11.5–15.5)
WBC: 5.5 10*3/uL (ref 4.0–10.5)

## 2020-10-27 LAB — URINALYSIS
Bilirubin Urine: NEGATIVE
Hgb urine dipstick: NEGATIVE
Ketones, ur: NEGATIVE
Leukocytes,Ua: NEGATIVE
Nitrite: NEGATIVE
Specific Gravity, Urine: 1.025 (ref 1.000–1.030)
Total Protein, Urine: NEGATIVE
Urine Glucose: NEGATIVE
Urobilinogen, UA: 0.2 (ref 0.0–1.0)
pH: 6 (ref 5.0–8.0)

## 2020-10-27 LAB — VITAMIN D 25 HYDROXY (VIT D DEFICIENCY, FRACTURES): VITD: 28.85 ng/mL — ABNORMAL LOW (ref 30.00–100.00)

## 2020-10-27 LAB — COMPREHENSIVE METABOLIC PANEL
ALT: 11 U/L (ref 0–35)
AST: 14 U/L (ref 0–37)
Albumin: 3.9 g/dL (ref 3.5–5.2)
Alkaline Phosphatase: 62 U/L (ref 39–117)
BUN: 12 mg/dL (ref 6–23)
CO2: 28 mEq/L (ref 19–32)
Calcium: 8.8 mg/dL (ref 8.4–10.5)
Chloride: 100 mEq/L (ref 96–112)
Creatinine, Ser: 0.8 mg/dL (ref 0.40–1.20)
GFR: 77.76 mL/min (ref >60.00)
Glucose, Bld: 95 mg/dL (ref 70–99)
Potassium: 3.9 mEq/L (ref 3.5–5.1)
Sodium: 136 mEq/L (ref 135–145)
Total Bilirubin: 0.4 mg/dL (ref 0.2–1.2)
Total Protein: 6.6 g/dL (ref 6.0–8.3)

## 2020-10-27 LAB — TSH: TSH: 1.05 u[IU]/mL (ref 0.35–4.50)

## 2020-10-27 MED ORDER — MELOXICAM 7.5 MG PO TABS: ORAL_TABLET | ORAL | 1 refills | Status: DC

## 2020-10-27 MED ORDER — LEVOCETIRIZINE DIHYDROCHLORIDE 5 MG PO TABS: 5.0000 mg | ORAL_TABLET | Freq: Every evening | ORAL | 2 refills | Status: DC

## 2020-10-27 NOTE — Patient Instructions (Addendum)
Heat (pad or rice pillow in microwave) over affected area, 10-15 minutes twice daily.   Ice/cold pack over area for 10-15 min twice daily.  OK to take Tylenol 1000 mg (2 extra strength tabs) or 975 mg (3 regular strength tabs) every 6 hours as needed.  OK to use meloxicam. Don't take this with Celebrex if you have samples.  Give [place] 2-3 business days to get the results of your labs back.   We will be in touch regarding your X-ray results also.   Let us know if you need anything.   Mid-Back Strain Rehab It is normal to feel mild stretching, pulling, tightness, or discomfort as you do these exercises, but you should stop right away if you feel sudden pain or your pain gets worse.   Stretching and range of motion exercises This exercise warms up your muscles and joints and improves the movement and flexibility of your back and shoulders. This exercise also help to relieve pain. Exercise A: Chest and spine stretch    1. Lie down on your back on a firm surface. 2. Roll a towel or a small blanket so it is about 4 inches (10 cm) in diameter. 3. Put the towel lengthwise under the middle of your back so it is under your spine, but not under your shoulder blades. 4. To increase the stretch, you may put your hands behind your head and let your elbows fall to your sides. 5. Hold for 30 seconds. Repeat exercise 2 times. Complete this exercise 3 times per week.  Strengthening exercises These exercises build strength and endurance in your back and your shoulder blade muscles. Endurance is the ability to use your muscles for a long time, even after they get tired. Exercise B: Alternating arm and leg raises    1. Get on your hands and knees on a firm surface. If you are on a hard floor, you may want to use padding to cushion your knees, such as an exercise mat. 2. Line up your arms and legs. Your hands should be below your shoulders, and your knees should be below your hips. 3. Lift your left leg  behind you. At the same time, raise your right arm and straighten it in front of you. ? Do not lift your leg higher than your hip. ? Do not lift your arm higher than your shoulder. ? Keep your abdominal and back muscles tight. ? Keep your hips facing the ground. ? Do not arch your back. ? Keep your balance carefully, and do not hold your breath. 4. Hold for 3 seconds. 5. Slowly return to the starting position and repeat with your right leg and your left arm. Repeat 2 times. Complete this exercise 3 times per week. Exercise C: Straight arm rows (shoulder extension)     1. Stand with your feet shoulder width apart. 2. Secure an exercise band to a stable object in front of you so the band is at or above shoulder height. 3. Hold one end of the exercise band in each hand. 4. Straighten your elbows and lift your hands up to shoulder height. 5. Step back, away from the secured end of the exercise band, until the band stretches. 6. Squeeze your shoulder blades together and pull your hands down to the sides of your thighs. Stop when your hands are straight down by your sides. Do not let your hands go behind your body. 7. Hold for 3 seconds. 8. Slowly return to the starting position. Repeat  2 times. Complete this exercise 3 times per week. Exercise D: Shoulder external rotation, prone 1. Lie on your abdomen on a firm bed so your left / right forearm hangs over the edge of the bed and your upper arm is on the bed, straight out from your body. ? Your elbow should be bent. ? Your palm should be facing your feet. 2. If instructed, hold a 5 lb weight in your hand. 3. Squeeze your shoulder blade toward the middle of your back. Do not let your shoulder lift toward your ear. 4. Keep your elbow bent in an "L" shape (90 degrees) while you slowly move your forearm up toward the ceiling. Move your forearm up to the height of the bed, toward your head. ? Your upper arm should not move. ? At the top of the  movement, your palm should face the floor. 5. Hold for 3 seconds. 6. Slowly return to the starting position and relax your muscles. Repeat 3 times. Complete this exercise 3 times per week. Exercise E: Scapular retraction and external rotation, rowing    1. Sit in a stable chair without armrests, or stand. 2. Secure an exercise band to a stable object in front of you so it is at shoulder height. 3. Hold one end of the exercise band in each hand. 4. Bring your arms out straight in front of you. 5. Step back, away from the secured end of the exercise band, until the band stretches. 6. Pull the band backward. As you do this, bend your elbows and squeeze your shoulder blades together, but avoid letting the rest of your body move. Do not let your shoulders lift up toward your ears. 7. Stop when your elbows are at your sides or slightly behind your body. 8. Hold for 5 seconds. 9. Slowly straighten your arms to return to the starting position. Repeat 2 times. Complete this exercise 3 times per week. Posture and body mechanics    Body mechanics refers to the movements and positions of your body while you do your daily activities. Posture is part of body mechanics. Good posture and healthy body mechanics can help to relieve stress in your body's tissues and joints. Good posture means that your spine is in its natural S-curve position (your spine is neutral), your shoulders are pulled back slightly, and your head is not tipped forward. The following are general guidelines for applying improved posture and body mechanics to your everyday activities. Standing     When standing, keep your spine neutral and your feet about hip-width apart. Keep a slight bend in your knees. Your ears, shoulders, and hips should line up.  When you do a task in which you lean forward while standing in one place for a long time, place one foot up on a stable object that is 2-4 inches (5-10 cm) high, such as a footstool. This  helps keep your spine neutral. Sitting     When sitting, keep your spine neutral and keep your feet flat on the floor. Use a footrest, if necessary, and keep your thighs parallel to the floor. Avoid rounding your shoulders, and avoid tilting your head forward.  When working at a desk or a computer, keep your desk at a height where your hands are slightly lower than your elbows. Slide your chair under your desk so you are close enough to maintain good posture.  When working at a computer, place your monitor at a height where you are looking straight ahead and  you do not have to tilt your head forward or downward to look at the screen. Resting    When lying down and resting, avoid positions that are most painful for you.  If you have pain with activities such as sitting, bending, stooping, or squatting (flexion-based activities), lie in a position in which your body does not bend very much. For example, avoid curling up on your side with your arms and knees near your chest (fetal position).  If you have pain with activities such as standing for a long time or reaching with your arms (extension-based activities), lie with your spine in a neutral position and bend your knees slightly. Try the following positions:  Lying on your side with a pillow between your knees.  Lying on your back with a pillow under your knees.   Lifting     When lifting objects, keep your feet at least shoulder-width apart and tighten your abdominal muscles.  Bend your knees and hips and keep your spine neutral. It is important to lift using the strength of your legs, not your back. Do not lock your knees straight out.  Always ask for help to lift heavy or awkward objects. Make sure you discuss any questions you have with your health care provider.

## 2020-10-27 NOTE — Progress Notes (Signed)
Chief Complaint  Patient presents with  . Pain    weakness    Subjective:  Patient is a 64 y.o. female here for a myriad of issues.  The patient has a history of rheumatoid arthritis.  She follows with rheumatology team and gets injections every other week.  Despite this, she has trouble managing her joint pain.  Rheumatology team suggested she be evaluated for osteoarthritis.  They gave her samples of Celebrex which she has not taken it.  She has joint pain all over her body though.  The patient has also noticed the curve in her upper back has gotten worse over the past several years.  Other people are starting to notice now.  She feels like she cannot take a deep breath related to it.  She is wondering if she has an infection.  She denies any fevers though does have a low-grade fever today.  No injury or change in activity.  She has not tried anything for this so far.  Patient has been experiencing quite a bit of fatigue.  She is wonder if she has urinary tract infection or what else could be going on.  She denies any urinary complaints, discharge, abdominal pain, flank pain.  Past Medical History:  Diagnosis Date  . Allergic rhinitis   . Anxiety   . Asthma   . Benign neoplasm of stomach    gastric polyps  . Diaphragmatic hernia without mention of obstruction or gangrene   . Diverticulosis of colon (without mention of hemorrhage)   . Esophageal reflux    see GI  . GERD (gastroesophageal reflux disease)   . Insomnia   . Osteoarthritis    sees ortho-has had steroid shot in knee  . Post menopausal problems    symptoms  . Premenstrual tension syndromes    gyn uses alprazolam and spironolactone prn ofr treatment  . Psoriasis   . Psoriatic arthritis ([place])   . Vitamin D deficiency     Objective: BP 140/78 (BP Location: Left Arm)   Pulse (!) 104   Temp 99.7 F (37.6 C) (Oral)   Ht 5' 5"  (1.651 m)   Wt 182 lb 6 oz (82.7 kg)   SpO2 97%   BMI 30.35 kg/m  General: Awake,  appears stated age HEENT: MMM, EOMi Heart: RRR, no lower extremity edema Abdomen: Soft, nontender, nondistended, no masses Lungs: CTAB, no rales, wheezes or rhonchi. No accessory muscle use Psych: Age appropriate judgment and insight, normal affect and mood  Assessment and Plan: Primary osteoarthritis, unspecified site - Plan: meloxicam (MOBIC) 7.5 MG tablet  Postural kyphosis of thoracic region - Plan: DG Chest 2 View  Dyspnea, unspecified type - Plan: DG Chest 2 View  Vitamin D insufficiency - Plan: VITAMIN D 25 Hydroxy (Vit-D Deficiency, Fractures)  Fatigue, unspecified type - Plan: CBC, Comprehensive metabolic panel, TSH, Urinalysis  1.  Try low-dose meloxicam, Tylenol as first option.  Continue to move.  Could consider physical therapy. 2/3.  Check x-ray.  Stretches and exercises for the upper back were provided. 4.  Check vitamin D 5.  Check UA in addition to basic labs.  Stay hydrated. I would like to recheck things in 2 weeks. The patient voiced understanding and agreement to the plan.  [place], DO 10/27/20  12:44 PM

## 2020-10-27 NOTE — Telephone Encounter (Signed)
Patient states she just saw Dr Nani Ravens this morning and needs to be checked for a sinus infection and would like to know if Dr Rosita Kea will send a a medication to pharmacy for her nasal drainage.

## 2020-10-28 ENCOUNTER — Other Ambulatory Visit (INDEPENDENT_AMBULATORY_CARE_PROVIDER_SITE_OTHER): Payer: 59

## 2020-10-28 ENCOUNTER — Other Ambulatory Visit: Payer: Self-pay | Admitting: Family Medicine

## 2020-10-28 ENCOUNTER — Encounter: Payer: Self-pay | Admitting: Family Medicine

## 2020-10-28 ENCOUNTER — Telehealth: Payer: Self-pay | Admitting: Family Medicine

## 2020-10-28 DIAGNOSIS — K625 Hemorrhage of anus and rectum: Secondary | ICD-10-CM

## 2020-10-28 DIAGNOSIS — E611 Iron deficiency: Secondary | ICD-10-CM | POA: Diagnosis not present

## 2020-10-28 DIAGNOSIS — K449 Diaphragmatic hernia without obstruction or gangrene: Secondary | ICD-10-CM | POA: Insufficient documentation

## 2020-10-28 LAB — IBC + FERRITIN
Ferritin: 8 ng/mL — ABNORMAL LOW (ref 10.0–291.0)
Iron: 40 ug/dL — ABNORMAL LOW (ref 42–145)
Saturation Ratios: 9.2 % — ABNORMAL LOW (ref 20.0–50.0)
Transferrin: 310 mg/dL (ref 212.0–360.0)

## 2020-10-28 MED ORDER — DOXYCYCLINE HYCLATE 100 MG PO TABS
100.0000 mg | ORAL_TABLET | Freq: Two times a day (BID) | ORAL | 0 refills | Status: AC
Start: 2020-10-28 — End: 2020-11-04

## 2020-10-28 NOTE — Progress Notes (Signed)
ur

## 2020-10-28 NOTE — Telephone Encounter (Signed)
The patient thinks she may have a sinus infection. She is having head pressure, some chills and wants to stay in bed, last night temp was 101, mostly at night she notices it the most. She failed to discuss at her appointment yesterday 10/27/20 because so many other things were discussed. She takes zyrtec daily and would like an antibiotic.

## 2020-10-29 ENCOUNTER — Other Ambulatory Visit (INDEPENDENT_AMBULATORY_CARE_PROVIDER_SITE_OTHER): Payer: 59

## 2020-10-29 ENCOUNTER — Other Ambulatory Visit: Payer: Self-pay

## 2020-10-29 DIAGNOSIS — E611 Iron deficiency: Secondary | ICD-10-CM | POA: Diagnosis not present

## 2020-10-30 LAB — URINALYSIS, MICROSCOPIC ONLY: RBC / HPF: NONE SEEN (ref 0–?)

## 2020-11-02 ENCOUNTER — Other Ambulatory Visit (INDEPENDENT_AMBULATORY_CARE_PROVIDER_SITE_OTHER): Payer: 59

## 2020-11-02 DIAGNOSIS — K625 Hemorrhage of anus and rectum: Secondary | ICD-10-CM | POA: Diagnosis not present

## 2020-11-02 DIAGNOSIS — E611 Iron deficiency: Secondary | ICD-10-CM

## 2020-11-03 ENCOUNTER — Other Ambulatory Visit: Payer: Self-pay | Admitting: Family Medicine

## 2020-11-03 ENCOUNTER — Telehealth: Payer: Self-pay | Admitting: *Deleted

## 2020-11-03 DIAGNOSIS — R195 Other fecal abnormalities: Secondary | ICD-10-CM

## 2020-11-03 LAB — FECAL OCCULT BLOOD, IMMUNOCHEMICAL: Fecal Occult Bld: POSITIVE — AB

## 2020-11-03 NOTE — Telephone Encounter (Signed)
Received call from [place] at [facility] with positive IFOB result on pt.

## 2020-11-04 ENCOUNTER — Encounter: Payer: PRIVATE HEALTH INSURANCE | Admitting: Nurse Practitioner

## 2020-11-10 ENCOUNTER — Telehealth: Payer: Self-pay | Admitting: Gastroenterology

## 2020-11-10 ENCOUNTER — Other Ambulatory Visit: Payer: Self-pay

## 2020-11-10 ENCOUNTER — Ambulatory Visit: Payer: 59 | Admitting: Family Medicine

## 2020-11-10 ENCOUNTER — Encounter: Payer: Self-pay | Admitting: Family Medicine

## 2020-11-10 VITALS — BP 120/72 | HR 90 | Temp 98.2°F | Ht 65.0 in | Wt 181.5 lb

## 2020-11-10 DIAGNOSIS — M1991 Primary osteoarthritis, unspecified site: Secondary | ICD-10-CM | POA: Diagnosis not present

## 2020-11-10 DIAGNOSIS — D509 Iron deficiency anemia, unspecified: Secondary | ICD-10-CM | POA: Diagnosis not present

## 2020-11-10 NOTE — Telephone Encounter (Signed)
Lm on vm for patient to return call 

## 2020-11-10 NOTE — Telephone Encounter (Signed)
Pt is requesting a call back from a nurse, pt is being referred for positive occult stool blood test, referral was placed on 11/16, but pt was not called, pt would like to be seen by ONLY Dr Loletha Carrow and does not wish to wait till January to be seen. NO appts available.

## 2020-11-10 NOTE — Progress Notes (Signed)
Chief Complaint  Patient presents with  . Follow-up    Subjective: Patient is a 64 y.o. female here for f/u.  Pt was seen for fatigue 2 weeks ago. Found to have Fe def anemia. Urine neg. FOBT positive, can't remember if she had started iron yet or not. She started on a PO Fe supp and is taking 2-3 times daily as her stomach will allow. She feels much better since then. She saw Dr. Loletha Carrow around 2.5 years ago for a colonoscopy and EGD that were unremarkable.  Pain is better as well. She has been using Mobic 7.5 mg/d, no AE's. Reports no new bleeding on this medication.   Past Medical History:  Diagnosis Date  . Allergic rhinitis   . Anxiety   . Asthma   . Benign neoplasm of stomach    gastric polyps  . Diaphragmatic hernia without mention of obstruction or gangrene   . Diverticulosis of colon (without mention of hemorrhage)   . Esophageal reflux    see GI  . GERD (gastroesophageal reflux disease)   . Hiatal hernia   . Insomnia   . Osteoarthritis    sees ortho-has had steroid shot in knee  . Post menopausal problems    symptoms  . Premenstrual tension syndromes    gyn uses alprazolam and spironolactone prn ofr treatment  . Psoriasis   . Psoriatic arthritis ([place])   . Vitamin D deficiency     Objective: BP 120/72 (BP Location: Left Arm, Patient Position: Sitting, Cuff Size: Normal)   Pulse 90   Temp 98.2 F (36.8 C) (Oral)   Ht 5\' 5"  (1.651 m)   Wt 181 lb 8 oz (82.3 kg)   SpO2 97%   BMI 30.20 kg/m  General: Awake, appears stated age HEENT: MMM, EOMi Heart: RRR Abd: BS+, NT, ND Lungs: CTAB, no rales, wheezes or rhonchi. No accessory muscle use Psych: Age appropriate judgment and insight, normal affect and mood  Assessment and Plan: Iron deficiency anemia, unspecified iron deficiency anemia type  Primary osteoarthritis, unspecified site  1. Needs to f/u w Dr. Loletha Carrow. Could be internal hemorrhoids. Cont PO Fe supp. 2. Will hold Mobic until eval'd by GI.  F/u in 3  mo for CPE or prn. The patient voiced understanding and agreement to the plan.  [place], DO 11/10/20  1:26 PM

## 2020-11-10 NOTE — Patient Instructions (Addendum)
Reach out to Dr. Loletha Carrow' office for iron deficiency.   Stay on the iron supplement.   Try to stop the meloxicam. This can worsen iron deficiency.   Let us know if you need anything.

## 2020-11-10 NOTE — Telephone Encounter (Signed)
Spoke with patient, she states that she will be able to make the appointment on 11/24/20 at 8:40 AM. Lab results are in epic for fecal occult blood test.

## 2020-11-13 ENCOUNTER — Other Ambulatory Visit: Payer: Self-pay | Admitting: Nurse Practitioner

## 2020-11-16 NOTE — Telephone Encounter (Signed)
Annual exam scheduled on 11/26/20

## 2020-11-24 ENCOUNTER — Ambulatory Visit (INDEPENDENT_AMBULATORY_CARE_PROVIDER_SITE_OTHER): Payer: 59 | Admitting: Gastroenterology

## 2020-11-24 ENCOUNTER — Encounter: Payer: Self-pay | Admitting: Gastroenterology

## 2020-11-24 VITALS — BP 110/72 | HR 88 | Ht 65.0 in | Wt 180.0 lb

## 2020-11-24 DIAGNOSIS — K449 Diaphragmatic hernia without obstruction or gangrene: Secondary | ICD-10-CM

## 2020-11-24 DIAGNOSIS — R195 Other fecal abnormalities: Secondary | ICD-10-CM | POA: Diagnosis not present

## 2020-11-24 DIAGNOSIS — D500 Iron deficiency anemia secondary to blood loss (chronic): Secondary | ICD-10-CM

## 2020-11-24 MED ORDER — PLENVU 140 G PO SOLR: 1.0000 | ORAL | 0 refills | Status: DC

## 2020-11-24 NOTE — Patient Instructions (Signed)
If you are age 64 or older, your body mass index should be between 23-30. Your Body mass index is 29.95 kg/m. If this is out of the aforementioned range listed, please consider follow up with your Primary Care Provider.  If you are age 64 or younger, your body mass index should be between 19-25. Your Body mass index is 29.95 kg/m. If this is out of the aformentioned range listed, please consider follow up with your Primary Care Provider.   You have been scheduled for an endoscopy and colonoscopy. Please follow the written instructions given to you at your visit today. Please pick up your prep supplies at the pharmacy within the next 1-3 days. If you use inhalers (even only as needed), please bring them with you on the day of your procedure.  It was great seeing you today!  Thank you for entrusting me with your care and choosing [facility].  Dr. Loletha Carrow

## 2020-11-24 NOTE — Progress Notes (Signed)
[facility] Progress Note  Chief Complaint: Iron deficiency anemia, heme positive stool  Subjective  History: Patient was last seen January 2019 for dysphagia and colon cancer screening.  Colonoscopy with excellent preparation revealed no polyps, mild left-sided diverticulosis.  Upper endoscopy with a large hiatal hernia.  Patient was fatigued and saw primary care and found to have iron deficiency anemia with hemoglobin 11.5, normal MCV, ferritin of 8, normal folate level (no B12 level checked) stool test then found to be positive for occult blood on 11/02/2020.   Melissa Middleton has generally felt well, and says she has chronic arthritis that tends to make her fatigued.  She was not sure something else was going on, so saw primary care and had the lab work noted above.  She took NSAIDs on average once or twice a week low-dose.  Denies abdominal pain, black or bloody stool until starting iron treatment.  PCP recommended 3 times daily, but she dropped to twice daily due to the constipation it was causing.  She has continued to remain on daily Nexium for many years Azyah still has intermittent feelings of food or pills stuck in the sternal notch.  ROS: Cardiovascular:  no chest pain Respiratory: no dyspnea Arthralgias Remainder of systems negative except as above  The patient's Past Medical, Family and Social History were reviewed and are on file in the EMR.  Objective:  Med list reviewed  Current Outpatient Medications:  .  albuterol (VENTOLIN HFA) 108 (90 Base) MCG/ACT inhaler, Inhale 2 puffs into the lungs every 4 (four) hours as needed for wheezing or shortness of breath., Disp: 18 g, Rfl: 1 .  ALPRAZolam (XANAX) 0.25 MG tablet, TAKE 1 TABLET AT BEDTIME AS NEEDED FOR ANXIETY, Disp: 30 tablet, Rfl: 0 .  Cholecalciferol (VITAMIN D) 50 MCG (2000 UT) CAPS, Take 4,000 Units by mouth daily., Disp: , Rfl:  .  EPINEPHrine (EPIPEN 2-PAK) 0.3 mg/0.3 mL IJ SOAJ injection, Use as directed for  severe allergic reactions, Disp: 2 Device, Rfl: 2 .  esomeprazole (NEXIUM) 40 MG capsule, TAKE 1 CAPSULE BY MOUTH EVERY DAY, Disp: 90 capsule, Rfl: 1 .  Estradiol (YUVAFEM) 10 MCG TABS vaginal tablet, Use vaginally 2 times per wk, Disp: 24 tablet, Rfl: 4 .  fluticasone (FLONASE) 50 MCG/ACT nasal spray, TWO SPRAYS EACH NOSTRIL ONCE A DAY FOR NASAL CONGESTION OR DRAINAGE., Disp: 16 g, Rfl: 5 .  ipratropium-albuterol (DUONEB) 0.5-2.5 (3) MG/3ML SOLN, ipratropium 0.5 mg-albuterol 3 mg (2.5 mg base)/3 mL nebulization soln, Disp: , Rfl:  .  levocetirizine (XYZAL) 5 MG tablet, Take 1 tablet (5 mg total) by mouth every evening., Disp: 30 tablet, Rfl: 2 .  methotrexate (RHEUMATREX) 2.5 MG tablet, Take 15 mg by mouth once a week., Disp: , Rfl:  .  montelukast (SINGULAIR) 10 MG tablet, Take 1 tablet (10 mg total) by mouth at bedtime., Disp: 90 tablet, Rfl: 3 .  SIMPONI ARIA 50 MG/4ML SOLN injection, , Disp: , Rfl:  .  spironolactone (ALDACTONE) 25 MG tablet, TAKE 1 TABLET (25 MG TOTAL) BY MOUTH DAILY AS NEEDED., Disp: 90 tablet, Rfl: 1 .  traZODone (DESYREL) 100 MG tablet, TAKE 1 TABLET BY MOUTH EVERYDAY AT BEDTIME, Disp: 90 tablet, Rfl: 1   Vital signs in last 24 hrs: Vitals:   11/24/20 0833  BP: 110/72  Pulse: 88    Physical Exam  Well-appearing  HEENT: sclera anicteric, oral mucosa moist without lesions  Neck: supple, no thyromegaly, JVD or lymphadenopathy  Cardiac: RRR without  murmurs, S1S2 heard, no peripheral edema  Pulm: clear to auscultation bilaterally, normal RR and effort noted  Abdomen: soft, no tenderness, with active bowel sounds. No guarding or palpable hepatosplenomegaly.  Skin; warm and dry, no jaundice or rash  Labs:  CBC Latest Ref Rng & Units 10/27/2020 01/02/2020 11/12/2018  WBC 4.0 - 10.5 K/uL 5.5 7.3 6.6  Hemoglobin 12.0 - 15.0 g/dL 11.5(L) 12.3 13.0  Hematocrit 36 - 46 % 35.7(L) 37.4 40.1  Platelets 150 - 400 K/uL 263.0 287.0 308.0   Iron/TIBC/Ferritin/ %Sat      Component Value Date/Time   IRON 40 (L) 10/28/2020 1115   FERRITIN 8.0 (L) 10/28/2020 1115   IRONPCTSAT 9.2 (L) 10/28/2020 1115    ___________________________________________ Radiologic studies:   ____________________________________________ Other:   _____________________________________________ Assessment & Plan  Assessment: Encounter Diagnoses  Name Primary?  . Iron deficiency anemia due to chronic blood loss Yes  . Heme positive stool   . Hiatal hernia     IDA and heme positive stool, chronic cricopharyngeal dysphagia.  More distal esophageal stricture related to hiatal hernia seems less likely to cause this dysphagia as she has described it. While she could have neoplasia or NSAID induced ulcer, I think these are less likely than "Cameron erosions" within her known large hiatal hernia.  We reviewed her previous endoscopy report findings.  She also has benign fundic gland polyps from chronic PPI use and reassured her those are not precancerous.  Plan: Upper endoscopy and colonoscopy.  She was agreeable after discussion of procedure and risks.  The benefits and risks of the planned procedure were described in detail with the patient or (when appropriate) their health care proxy.  Risks were outlined as including, but not limited to, bleeding, infection, perforation, adverse medication reaction leading to cardiac or pulmonary decompensation, pancreatitis (if ERCP).  The limitation of incomplete mucosal visualization was also discussed.  No guarantees or warranties were given.  She is okay to take NSAIDs for now my opinion  Continue twice daily iron.  If it is causing too much digestive upset or constipation, decrease to once daily.  Depending on upper endoscopy findings, we can then determine if surgical consultation is warranted for repair of the hiatal hernia.  40 minutes were spent on this encounter (including chart review, history/exam, counseling/coordination of care,  and documentation)  [name]

## 2020-11-26 ENCOUNTER — Other Ambulatory Visit: Payer: Self-pay

## 2020-11-26 ENCOUNTER — Encounter: Payer: Self-pay | Admitting: Nurse Practitioner

## 2020-11-26 ENCOUNTER — Ambulatory Visit (INDEPENDENT_AMBULATORY_CARE_PROVIDER_SITE_OTHER): Payer: 59 | Admitting: Nurse Practitioner

## 2020-11-26 VITALS — BP 110/78 | Ht 65.0 in | Wt 181.0 lb

## 2020-11-26 DIAGNOSIS — Z01419 Encounter for gynecological examination (general) (routine) without abnormal findings: Secondary | ICD-10-CM | POA: Diagnosis not present

## 2020-11-26 DIAGNOSIS — R5383 Other fatigue: Secondary | ICD-10-CM | POA: Diagnosis not present

## 2020-11-26 DIAGNOSIS — N951 Menopausal and female climacteric states: Secondary | ICD-10-CM | POA: Diagnosis not present

## 2020-11-26 MED ORDER — ESTRADIOL 10 MCG VA TABS: ORAL_TABLET | VAGINAL | 4 refills | Status: DC

## 2020-11-26 NOTE — Progress Notes (Signed)
   [place] DOST 04/07/1956 [id number]   History:  64 y.o. G2P2002 presents for annual exam. Postmenopausal - no HRT, does use Yuvafem tablet for vaginal dryness but has not been using consistently lately. Normal pap and mammogram history. Sister with history of ovarian cancer.  Rheumatology managing psoriatic arthritis, on methotrexate and infusions. She has been experiencing facial rashes and is using cream and benadryl. She has been evaluated for fatigue and was found to have low vitamin D and is iron deficient.  She is scheduled to have endoscopy/colonoscopy with GI for positive Hemoccult.  Gynecologic History No LMP recorded. Patient postmenopausal.   Last Pap: 10/30/2018. Results were: Normal Last mammogram: 10/12/2020. Results were: normal Last colonoscopy: 2019. Results were: normal, 10-year recall Last Dexa: 06/04/2020. Results were: normal  Past medical history, past surgical history, family history and social history were all reviewed and documented in the EPIC chart.  ROS:  A ROS was performed and pertinent positives and negatives are included.  Exam:  Vitals:   11/26/20 1525  BP: 110/78  Weight: 181 lb (82.1 kg)  Height: 5\' 5"  (1.651 m)   Body mass index is 30.12 kg/m.  General appearance:  Normal Thyroid:  Symmetrical, normal in size, without palpable masses or nodularity. Respiratory  Auscultation:  Clear without wheezing or rhonchi Cardiovascular  Auscultation:  Regular rate, without rubs, murmurs or gallops  Edema/varicosities:  Not grossly evident Abdominal  Soft,nontender, without masses, guarding or rebound.  Liver/spleen:  No organomegaly noted  Hernia:  None appreciated  Skin  Inspection:  Grossly normal   Breasts: Examined lying and sitting.   Right: Without masses, retractions, discharge or axillary adenopathy.   Left: Without masses, retractions, discharge or axillary adenopathy. Gentitourinary   Inguinal/mons:  Normal without inguinal  adenopathy  External genitalia:  Normal  BUS/Urethra/Skene's glands:  Normal  Vagina:  Atrophic changes  Cervix:  Normal  Uterus:  znormal in size, shape and contour.  Midline and mobile  Adnexa/parametria:     Rt: Without masses or tenderness.   Lt: Without masses or tenderness.  Anus and perineum: Normal  Digital rectal exam: Normal sphincter tone without palpated masses or tenderness  Assessment/Plan:  64 y.o. [id number] for breast and pelvic exam.   Well female exam with routine gynecological exam -Education provided on SBEs, importance of preventative screenings, current guidelines, high calcium diet, regular exercise, and multivitamin daily. Labs with PCP and rheumatology.   Fatigue, unspecified type - Plan: Vitamin B12.  Work-up done with PCP and was found to have low vitamin D and is iron deficient.  She plans to see GI for endoscopy/colonoscopy for positive Hemoccult.  We will check a B12 today  Vaginal dryness, menopausal - Plan: Estradiol (YUVAFEM) 10 MCG TABS vaginal tablet.  Refill provided since hers have expired.  She would like to use more consistently.  Screening for cervical cancer -normal Pap history.  Annual Pap smears due to immunosuppression medications.  Pap with reflex today.  Screening for breast cancer -normal mammogram history.  Continue annual screenings.  Normal breast exam today.  Screening for colon cancer -colonoscopy in 2019 was normal.  She is scheduled for endoscopy/colonoscopy soon with GI for positive Hemoccult.  Follow-up in 1 year for annual.   Tamela Gammon [facility], 3:31 PM 11/26/2020

## 2020-11-26 NOTE — Patient Instructions (Signed)

## 2020-11-27 LAB — VITAMIN B12: Vitamin B-12: 492 pg/mL (ref 200–1100)

## 2020-11-30 LAB — PAP IG W/ RFLX HPV ASCU

## 2020-12-04 ENCOUNTER — Ambulatory Visit: Payer: 59 | Admitting: Family Medicine

## 2020-12-14 ENCOUNTER — Telehealth: Payer: Self-pay | Admitting: Gastroenterology

## 2020-12-14 NOTE — Telephone Encounter (Signed)
Inbound call from patient requesting a call back please in regards to her prep medication for her procedure scheduled on 01/13/21.

## 2020-12-14 NOTE — Telephone Encounter (Signed)
This needs to go to the CMA thanks

## 2020-12-15 NOTE — Telephone Encounter (Signed)
Returned pt call. Left message for pt.

## 2020-12-16 ENCOUNTER — Other Ambulatory Visit: Payer: Self-pay

## 2020-12-17 NOTE — Telephone Encounter (Signed)
Returned pt call. Pt's insurance would not cover Plenvu. Sample of Plenuv left at front desk for pt. Pt will come by on Tuesday and pick up.

## 2020-12-17 NOTE — Telephone Encounter (Signed)
Pt is requesting a call back from a nurse in regards to her PREP medication.

## 2020-12-27 ENCOUNTER — Other Ambulatory Visit: Payer: Self-pay | Admitting: Family Medicine

## 2020-12-27 DIAGNOSIS — M1991 Primary osteoarthritis, unspecified site: Secondary | ICD-10-CM

## 2021-01-13 ENCOUNTER — Other Ambulatory Visit: Payer: Self-pay

## 2021-01-13 ENCOUNTER — Other Ambulatory Visit (INDEPENDENT_AMBULATORY_CARE_PROVIDER_SITE_OTHER): Payer: 59

## 2021-01-13 ENCOUNTER — Ambulatory Visit (AMBULATORY_SURGERY_CENTER): Payer: 59 | Admitting: Gastroenterology

## 2021-01-13 ENCOUNTER — Encounter: Payer: Self-pay | Admitting: Gastroenterology

## 2021-01-13 VITALS — BP 118/73 | HR 86 | Temp 98.7°F | Resp 17 | Ht 65.0 in | Wt 180.0 lb

## 2021-01-13 DIAGNOSIS — K5730 Diverticulosis of large intestine without perforation or abscess without bleeding: Secondary | ICD-10-CM

## 2021-01-13 DIAGNOSIS — K449 Diaphragmatic hernia without obstruction or gangrene: Secondary | ICD-10-CM

## 2021-01-13 DIAGNOSIS — K317 Polyp of stomach and duodenum: Secondary | ICD-10-CM

## 2021-01-13 DIAGNOSIS — D500 Iron deficiency anemia secondary to blood loss (chronic): Secondary | ICD-10-CM

## 2021-01-13 DIAGNOSIS — K319 Disease of stomach and duodenum, unspecified: Secondary | ICD-10-CM | POA: Diagnosis not present

## 2021-01-13 DIAGNOSIS — R195 Other fecal abnormalities: Secondary | ICD-10-CM

## 2021-01-13 LAB — CBC WITH DIFFERENTIAL/PLATELET
Basophils Absolute: 0 10*3/uL (ref 0.0–0.1)
Basophils Relative: 0.5 % (ref 0.0–3.0)
Eosinophils Absolute: 0.1 10*3/uL (ref 0.0–0.7)
Eosinophils Relative: 2.4 % (ref 0.0–5.0)
HCT: 38.8 % (ref 36.0–46.0)
Hemoglobin: 12.9 g/dL (ref 12.0–15.0)
Lymphocytes Relative: 27.4 % (ref 12.0–46.0)
Lymphs Abs: 1.6 10*3/uL (ref 0.7–4.0)
MCHC: 33.3 g/dL (ref 30.0–36.0)
MCV: 87.3 fl (ref 78.0–100.0)
Monocytes Absolute: 0.5 10*3/uL (ref 0.1–1.0)
Monocytes Relative: 8.8 % (ref 3.0–12.0)
Neutro Abs: 3.5 10*3/uL (ref 1.4–7.7)
Neutrophils Relative %: 60.9 % (ref 43.0–77.0)
Platelets: 265 10*3/uL (ref 150.0–400.0)
RBC: 4.44 Mil/uL (ref 3.87–5.11)
RDW: 16.5 % — ABNORMAL HIGH (ref 11.5–15.5)
WBC: 5.7 10*3/uL (ref 4.0–10.5)

## 2021-01-13 LAB — IBC + FERRITIN
Ferritin: 30.3 ng/mL (ref 10.0–291.0)
Iron: 75 ug/dL (ref 42–145)
Saturation Ratios: 18.7 % — ABNORMAL LOW (ref 20.0–50.0)
Transferrin: 287 mg/dL (ref 212.0–360.0)

## 2021-01-13 MED ORDER — SODIUM CHLORIDE 0.9 % IV SOLN: 500.0000 mL | Freq: Once | INTRAVENOUS | Status: DC

## 2021-01-13 NOTE — Progress Notes (Signed)
Vital signs checked by:CW ? ?The medical and surgical history was reviewed and verified with the patient. ? ?

## 2021-01-13 NOTE — Op Note (Signed)
[facility] Patient Name: Melissa Middleton Procedure Date: 01/13/2021 3:17 PM MRN: [id number] Endoscopist: [place]. Loletha Carrow , MD Age: 65 Referring MD:  Date of Birth: 10-Apr-1956 Gender: Female Account #: [id number] Procedure:                Colonoscopy Indications:              Heme positive stool, Unexplained iron deficiency                            anemia Medicines:                Monitored Anesthesia Care Procedure:                Pre-Anesthesia Assessment:                           - Prior to the procedure, a History and Physical                            was performed, and patient medications and                            allergies were reviewed. The patient's tolerance of                            previous anesthesia was also reviewed. The risks                            and benefits of the procedure and the sedation                            options and risks were discussed with the patient.                            All questions were answered, and informed consent                            was obtained. Prior Anticoagulants: The patient has                            taken no previous anticoagulant or antiplatelet                            agents. ASA Grade Assessment: II - A patient with                            mild systemic disease. After reviewing the risks                            and benefits, the patient was deemed in                            satisfactory condition to undergo the procedure.  After obtaining informed consent, the colonoscope                            was passed under direct vision. Throughout the                            procedure, the patient's blood pressure, pulse, and                            oxygen saturations were monitored continuously. The                            Olympus PFC-[id number]) Colonoscope was                            introduced through the anus and advanced to the the                             terminal ileum, with identification of the                            appendiceal orifice and IC valve. The colonoscopy                            was somewhat difficult due to a redundant colon and                            a tortuous colon. Successful completion of the                            procedure was aided by using manual pressure. The                            patient tolerated the procedure well. The quality                            of the bowel preparation was excellent. The                            terminal ileum, ileocecal valve, appendiceal                            orifice, and rectum were photographed. Scope In: 3:30:22 PM Scope Out: 3:46:21 PM Scope Withdrawal Time: 0 hours 7 minutes 46 seconds  Total Procedure Duration: 0 hours 15 minutes 59 seconds  Findings:                 The perianal and digital rectal examinations were                            normal.                           The terminal ileum appeared normal.  Multiple diverticula were found in the left colon                            and right colon.                           The exam was otherwise without abnormality on                            direct and retroflexion views. Complications:            No immediate complications. Estimated Blood Loss:     Estimated blood loss: none. Impression:               - The examined portion of the ileum was normal.                           - Diverticulosis in the left colon and in the right                            colon.                           - The examination was otherwise normal on direct                            and retroflexion views.                           - No specimens collected. Recommendation:           - Patient has a contact number available for                            emergencies. The signs and symptoms of potential                            delayed complications were discussed  with the                            patient. Return to normal activities tomorrow.                            Written discharge instructions were provided to the                            patient.                           - Resume previous diet.                           - Continue present medications.                           - Repeat colonoscopy in 10 years for screening  purposes.                           - See the other procedure note for documentation of                            additional recommendations. Xayne Brumbaugh L. Loletha Carrow, MD 01/13/2021 3:58:43 PM This report has been signed electronically.

## 2021-01-13 NOTE — Progress Notes (Signed)
1523 Robinul 0.1 mg IV given due large amount of secretions upon assessment.  MD made aware, vss  

## 2021-01-13 NOTE — Op Note (Signed)
[facility] Patient Name: Melissa Middleton Procedure Date: 01/13/2021 3:17 PM MRN: [id number] Endoscopist: [place]. Loletha Carrow , MD Age: 65 Referring MD:  Date of Birth: 1956/12/11 Gender: Female Account #: [id number] Procedure:                Upper GI endoscopy Indications:              Unexplained iron deficiency anemia, Pharyngeal                            phase dysphagia (primarily pills), Heme positive                            stool Medicines:                Monitored Anesthesia Care Procedure:                Pre-Anesthesia Assessment:                           - Prior to the procedure, a History and Physical                            was performed, and patient medications and                            allergies were reviewed. The patient's tolerance of                            previous anesthesia was also reviewed. The risks                            and benefits of the procedure and the sedation                            options and risks were discussed with the patient.                            All questions were answered, and informed consent                            was obtained. Prior Anticoagulants: The patient has                            taken no previous anticoagulant or antiplatelet                            agents. ASA Grade Assessment: II - A patient with                            mild systemic disease. After reviewing the risks                            and benefits, the patient was deemed in  satisfactory condition to undergo the procedure.                           After obtaining informed consent, the endoscope was                            passed under direct vision. Throughout the                            procedure, the patient's blood pressure, pulse, and                            oxygen saturations were monitored continuously. The                            Endoscope was introduced through the mouth, and                             advanced to the second part of duodenum. The upper                            GI endoscopy was accomplished without difficulty.                            The patient tolerated the procedure well. Scope In: Scope Out: Findings:                 The larynx was normal.                           A large hiatal hernia was present with both axial                            and para-esophageal components. Approximately half                            the stomach is herniated into the chest (                            progression from last EGD). This hernia is causing                            distal esophageal tortuosity.                           A few small erosions with no bleeding and no                            stigmata of recent bleeding were found in the                            gastric fundus, within the herniated stomach..                           Multiple sessile fundic  gland polyps were found in                            the gastric fundus and in the gastric body.                           The examined duodenum was normal. Complications:            No immediate complications. Estimated Blood Loss:     Estimated blood loss: none. Impression:               - Normal larynx.                           - Large hiatal hernia.                           - Erosive gastropathy with no bleeding and no                            stigmata of recent bleeding. These "Lysbeth Galas"                            erosions most likely explain the patient's IDA and                            heme positive stool.                           - Multiple fundic gland polyps.                           - Normal examined duodenum.                           - No specimens collected. Recommendation:           - Patient has a contact number available for                            emergencies. The signs and symptoms of potential                            delayed complications were discussed  with the                            patient. Return to normal activities tomorrow.                            Written discharge instructions were provided to the                            patient.                           - Resume previous diet.                           -  Continue present medications.                           - CBC and iron studies today (patient reports that                            her energery level has improved on iron tablets for                            the last 3 months)                           - See the other procedure note for documentation of                            additional recommendations.                           - Refer to a surgeon to discuss hiatal hernia                            repair. Spence Soberano L. Loletha Carrow, MD 01/13/2021 4:07:00 PM This report has been signed electronically.

## 2021-01-13 NOTE — Progress Notes (Signed)
Report given to PACU, vss 

## 2021-01-13 NOTE — Patient Instructions (Signed)
YOU HAD AN ENDOSCOPIC PROCEDURE TODAY AT [facility]:   Refer to the procedure report that was given to you for any specific questions about what was found during the examination.  If the procedure report does not answer your questions, please call your gastroenterologist to clarify.  If you requested that your care partner not be given the details of your procedure findings, then the procedure report has been included in a sealed envelope for you to review at your convenience later.  YOU SHOULD EXPECT: Some feelings of bloating in the abdomen. Passage of more gas than usual.  Walking can help get rid of the air that was put into your GI tract during the procedure and reduce the bloating. If you had a lower endoscopy (such as a colonoscopy or flexible sigmoidoscopy) you may notice spotting of blood in your stool or on the toilet paper. If you underwent a bowel prep for your procedure, you may not have a normal bowel movement for a few days.  Please Note:  You might notice some irritation and congestion in your nose or some drainage.  This is from the oxygen used during your procedure.  There is no need for concern and it should clear up in a day or so.  SYMPTOMS TO REPORT IMMEDIATELY:   Following lower endoscopy (colonoscopy or flexible sigmoidoscopy):  Excessive amounts of blood in the stool  Significant tenderness or worsening of abdominal pains  Swelling of the abdomen that is new, acute  Fever of 100F or higher   Following upper endoscopy (EGD)  Vomiting of blood or coffee ground material  New chest pain or pain under the shoulder blades  Painful or persistently difficult swallowing  New shortness of breath  Fever of 100F or higher  Black, tarry-looking stools  For urgent or emergent issues, a gastroenterologist can be reached at any hour by calling [phone]. Do not use MyChart messaging for urgent concerns.    DIET:  We do recommend a small meal at first, but  then you may proceed to your regular diet.  Drink plenty of fluids but you should avoid alcoholic beverages for 24 hours.  MEDICATIONS: Continue present medications  Please see handouts given to you by your recovery nurse.  We will take you to the lab to have blood drawn for a CBC and Iron studies.  Follow Up: Dr. Loletha Carrow will refer you to a surgeon to discuss hiatal hernia repair. His office nurse will call you to set up this appointment.  ACTIVITY:  You should plan to take it easy for the rest of today and you should NOT DRIVE or use heavy machinery until tomorrow (because of the sedation medicines used during the test).    FOLLOW UP: Our staff will call the number listed on your records 48-72 hours following your procedure to check on you and address any questions or concerns that you may have regarding the information given to you following your procedure. If we do not reach you, we will leave a message.  We will attempt to reach you two times.  During this call, we will ask if you have developed any symptoms of COVID 19. If you develop any symptoms (ie: fever, flu-like symptoms, shortness of breath, cough etc.) before then, please call [phone].  If you test positive for Covid 19 in the 2 weeks post procedure, please call and report this information to [place].    If any biopsies were taken you will be contacted by  phone or by letter within the next 1-3 weeks.  Please call us at [phone] if you have not heard about the biopsies in 3 weeks.   Thank you for allowing [place] to provide for your healthcare needs today.   SIGNATURES/CONFIDENTIALITY: You and/or your care partner have signed paperwork which will be entered into your electronic medical record.  These signatures attest to the fact that that the information above on your After Visit Summary has been reviewed and is understood.  Full responsibility of the confidentiality of this discharge information lies with you and/or your  care-partner.

## 2021-01-14 ENCOUNTER — Telehealth: Payer: Self-pay

## 2021-01-14 NOTE — Telephone Encounter (Signed)
Referral has been faxed to [facility] via epic.

## 2021-01-14 NOTE — Telephone Encounter (Signed)
-----   Message from Doran Stabler, MD sent at 01/13/2021  4:45 PM EST ----- Herbert Seta,  Please send a referral to Dr. Ralene Ok at [facility] for hiatal hernia

## 2021-01-15 ENCOUNTER — Telehealth: Payer: Self-pay | Admitting: *Deleted

## 2021-01-15 NOTE — Telephone Encounter (Signed)
  Follow up Call-  Call back number 01/13/2021  Post procedure Call Back phone  # [phone]  Permission to leave phone message Yes  Some recent data might be hidden     Patient questions:  Do you have a fever, pain , or abdominal swelling? No. Pain Score  0 *  Have you tolerated food without any problems? Yes.    Have you been able to return to your normal activities? Yes.    Do you have any questions about your discharge instructions: Diet   No. Medications  No. Follow up visit  No.  Do you have questions or concerns about your Care? No.  Actions: * If pain score is 4 or above: No action needed, pain <4.  1. Have you developed a fever since your procedure? no  2.   Have you had an respiratory symptoms (SOB or cough) since your procedure? no  3.   Have you tested positive for COVID 19 since your procedure no  4.   Have you had any family members/close contacts diagnosed with the COVID 19 since your procedure?  no   If yes to any of these questions please route to Joylene John, RN and Joella Prince, RN

## 2021-01-18 ENCOUNTER — Other Ambulatory Visit: Payer: Self-pay | Admitting: Family Medicine

## 2021-01-18 NOTE — Telephone Encounter (Signed)
Called [facility] to follow up on referral, patient has been scheduled with Dr. Kieth Brightly on 02/05/21 at 3:40 PM.

## 2021-01-26 ENCOUNTER — Other Ambulatory Visit: Payer: Self-pay | Admitting: Nurse Practitioner

## 2021-02-10 ENCOUNTER — Other Ambulatory Visit: Payer: Self-pay

## 2021-02-10 ENCOUNTER — Ambulatory Visit (INDEPENDENT_AMBULATORY_CARE_PROVIDER_SITE_OTHER): Payer: 59 | Admitting: Family Medicine

## 2021-02-10 ENCOUNTER — Encounter: Payer: Self-pay | Admitting: Family Medicine

## 2021-02-10 VITALS — BP 120/82 | HR 98 | Temp 98.2°F | Ht 65.0 in | Wt 180.2 lb

## 2021-02-10 DIAGNOSIS — Z8639 Personal history of other endocrine, nutritional and metabolic disease: Secondary | ICD-10-CM | POA: Diagnosis not present

## 2021-02-10 DIAGNOSIS — E782 Mixed hyperlipidemia: Secondary | ICD-10-CM | POA: Diagnosis not present

## 2021-02-10 DIAGNOSIS — Z114 Encounter for screening for human immunodeficiency virus [HIV]: Secondary | ICD-10-CM

## 2021-02-10 DIAGNOSIS — Z0000 Encounter for general adult medical examination without abnormal findings: Secondary | ICD-10-CM | POA: Diagnosis not present

## 2021-02-10 DIAGNOSIS — E611 Iron deficiency: Secondary | ICD-10-CM | POA: Diagnosis not present

## 2021-02-10 DIAGNOSIS — Z1159 Encounter for screening for other viral diseases: Secondary | ICD-10-CM

## 2021-02-10 MED ORDER — TRAZODONE HCL 100 MG PO TABS: ORAL_TABLET | ORAL | 1 refills | Status: DC

## 2021-02-10 NOTE — Progress Notes (Signed)
Chief Complaint  Patient presents with  . Annual Exam     Well Woman Melissa Middleton is here for a complete physical.   Her last physical was >1 year ago.  Current diet: in general, a "healthy" diet. Current exercise: some walking, not much lately. Weight is stable and she denies fatigue out of ordinary.  Seatbelt? Yes Loss of interested in doing things or depression in past 2 weeks? No  Health Maintenance Pap/HPV- Yes Mammogram- Yes Colon cancer screening-Yes Shingrix- Yes Tetanus- Yes Hep C screening- No HIV screening- No  Past Medical History:  Diagnosis Date  . Allergic rhinitis   . Anxiety   . Asthma   . Benign neoplasm of stomach    gastric polyps  . Diaphragmatic hernia without mention of obstruction or gangrene   . Diverticulosis of colon (without mention of hemorrhage)   . Esophageal reflux    see GI  . GERD (gastroesophageal reflux disease)   . Hiatal hernia   . Insomnia   . Osteoarthritis    sees ortho-has had steroid shot in knee  . Post menopausal problems    symptoms  . Premenstrual tension syndromes    gyn uses alprazolam and spironolactone prn ofr treatment  . Psoriasis   . Psoriatic arthritis ([place])   . Vitamin D deficiency      Past Surgical History:  Procedure Laterality Date  . CESAREAN SECTION    . ENDOMETRIAL ABLATION    . NASAL SINUS SURGERY    . TOTAL KNEE ARTHROPLASTY Right 11/2009  . TUBAL LIGATION  2004   with ablation    Medications  Current Outpatient Medications on File Prior to Visit  Medication Sig Dispense Refill  . albuterol (VENTOLIN HFA) 108 (90 Base) MCG/ACT inhaler Inhale 2 puffs into the lungs every 4 (four) hours as needed for wheezing or shortness of breath. 18 g 1  . ALPRAZolam (XANAX) 0.25 MG tablet TAKE 1 TABLET AT BEDTIME AS NEEDED FOR ANXIETY 30 tablet 0  . Cholecalciferol (VITAMIN D) 50 MCG (2000 UT) CAPS Take 4,000 Units by mouth daily.    Marland Kitchen EPINEPHrine (EPIPEN 2-PAK) 0.3 mg/0.3 mL IJ SOAJ injection Use as  directed for severe allergic reactions 2 Device 2  . esomeprazole (NEXIUM) 40 MG capsule TAKE 1 CAPSULE BY MOUTH EVERY DAY 90 capsule 1  . Estradiol (YUVAFEM) 10 MCG TABS vaginal tablet Use vaginally 2 times per wk 24 tablet 4  . fluticasone (FLONASE) 50 MCG/ACT nasal spray TWO SPRAYS EACH NOSTRIL ONCE A DAY FOR NASAL CONGESTION OR DRAINAGE. 16 g 5  . ipratropium-albuterol (DUONEB) 0.5-2.5 (3) MG/3ML SOLN ipratropium 0.5 mg-albuterol 3 mg (2.5 mg base)/3 mL nebulization soln    . levocetirizine (XYZAL) 5 MG tablet TAKE 1 TABLET BY MOUTH EVERY DAY IN THE EVENING 90 tablet 0  . meloxicam (MOBIC) 7.5 MG tablet TAKE 1 TAB DAILY AS NEEDED FOR ARTHRITIS. 30 tablet 1  . methotrexate (RHEUMATREX) 2.5 MG tablet Take 15 mg by mouth once a week.    . montelukast (SINGULAIR) 10 MG tablet Take 1 tablet (10 mg total) by mouth at bedtime. 90 tablet 3  . SIMPONI ARIA 50 MG/4ML SOLN injection     . spironolactone (ALDACTONE) 25 MG tablet TAKE 1 TABLET (25 MG TOTAL) BY MOUTH DAILY AS NEEDED. 90 tablet 1  . traZODone (DESYREL) 100 MG tablet TAKE 1 TABLET BY MOUTH EVERYDAY AT BEDTIME 90 tablet 1    Allergies Allergies  Allergen Reactions  . Macrodantin [Nitrofurantoin Macrocrystal] Itching  and Rash  . Shellfish Allergy   . Cefuroxime Axetil Rash and Other (See Comments)    REACTION: red face/rash  . Flavoring Agent Itching and Rash  . Penicillins Rash    Review of Systems: Constitutional:  no unexpected weight changes Eye:  no recent significant change in vision Ear/Nose/Mouth/Throat:  Ears:  no recent change in hearing Nose/Mouth/Throat:  no complaints of nasal congestion, no sore throat Cardiovascular: no chest pain Respiratory:  no shortness of breath Gastrointestinal:  no abdominal pain, no change in bowel habits GU:  Female: negative for dysuria or pelvic pain Musculoskeletal/Extremities:  no new pain of the joints Integumentary (Skin/Breast):  no abnormal skin lesions reported Neurologic:  no  headaches Endocrine:  denies worsening fatigue  Exam BP 120/82 (BP Location: Left Arm, Patient Position: Sitting, Cuff Size: Normal)   Pulse 98   Temp 98.2 F (36.8 C) (Oral)   Ht 5\' 5"  (1.651 m)   Wt 180 lb 4 oz (81.8 kg)   SpO2 97%   BMI 30.00 kg/m  General:  well developed, well nourished, in no apparent distress Skin:  no significant moles, warts, or growths Head:  no masses, lesions, or tenderness Eyes:  pupils equal and round, sclera anicteric without injection Ears:  canals without lesions, TMs shiny without retraction, no obvious effusion, no erythema Nose:  nares patent, septum midline, mucosa normal, and no drainage or sinus tenderness Throat/Pharynx:  lips and gingiva without lesion; tongue and uvula midline; non-inflamed pharynx; no exudates or postnasal drainage Neck: neck supple without adenopathy, thyromegaly, or masses Lungs:  clear to auscultation, breath sounds equal bilaterally, no respiratory distress Cardio:  regular rate and rhythm, no LE edema Abdomen:  abdomen soft, nontender; bowel sounds normal; no masses or organomegaly Genital: Defer to GYN Musculoskeletal:  symmetrical muscle groups noted without atrophy or deformity Extremities:  no clubbing, cyanosis, or edema, no deformities, no skin discoloration Neuro:  gait normal; deep tendon reflexes normal and symmetric Psych: well oriented with normal range of affect and appropriate judgment/insight  Assessment and Plan  Well adult exam  H/O vitamin D deficiency - Plan: VITAMIN D 25 Hydroxy (Vit-D Deficiency, Fractures)  Low iron - Plan: CBC, IBC + Ferritin  Mixed hyperlipidemia - Plan: Comprehensive metabolic panel, Lipid panel  Screening for HIV (human immunodeficiency virus) - Plan: HIV Antibody (routine testing w rflx)  Encounter for hepatitis C screening test for low risk patient - Plan: Hepatitis C antibody   Well 65 y.o. female. Counseled on diet and exercise. Other orders as above. Follow  up in 6 mo or prn. The patient voiced understanding and agreement to the plan.  [place], DO 02/10/21 1:39 PM

## 2021-02-10 NOTE — Patient Instructions (Addendum)
Give [place] 2-3 business days to get the results of your labs back.   Keep the diet clean and stay active.  Aim to do some physical exertion for 150 minutes per week. This is typically divided into 5 days per week, 30 minutes per day. The activity should be enough to get your heart rate up. Anything is better than nothing if you have time constraints.  OK to use Debrox (peroxide) in the ear to loosen up wax. Also recommend using a bulb syringe (for removing boogers from baby's noses) to flush through warm water and vinegar (3-4:1 ratio). An alternative, though more expensive, is an elephant ear washer wax removal kit. Do not use Q-tips as this can impact wax further.  Let us know if you need anything.

## 2021-02-11 LAB — CBC
HCT: 40.6 % (ref 36.0–46.0)
Hemoglobin: 13.4 g/dL (ref 12.0–15.0)
MCHC: 32.9 g/dL (ref 30.0–36.0)
MCV: 88.3 fl (ref 78.0–100.0)
Platelets: 313 10*3/uL (ref 150.0–400.0)
RBC: 4.6 Mil/uL (ref 3.87–5.11)
RDW: 16.2 % — ABNORMAL HIGH (ref 11.5–15.5)
WBC: 7.3 10*3/uL (ref 4.0–10.5)

## 2021-02-11 LAB — COMPREHENSIVE METABOLIC PANEL
ALT: 12 U/L (ref 0–35)
AST: 13 U/L (ref 0–37)
Albumin: 4.1 g/dL (ref 3.5–5.2)
Alkaline Phosphatase: 63 U/L (ref 39–117)
BUN: 25 mg/dL — ABNORMAL HIGH (ref 6–23)
CO2: 29 mEq/L (ref 19–32)
Calcium: 10.8 mg/dL — ABNORMAL HIGH (ref 8.4–10.5)
Chloride: 100 mEq/L (ref 96–112)
Creatinine, Ser: 1 mg/dL (ref 0.40–1.20)
GFR: 59.37 mL/min — ABNORMAL LOW (ref >60.00)
Glucose, Bld: 88 mg/dL (ref 70–99)
Potassium: 5 mEq/L (ref 3.5–5.1)
Sodium: 137 mEq/L (ref 135–145)
Total Bilirubin: 0.5 mg/dL (ref 0.2–1.2)
Total Protein: 7.1 g/dL (ref 6.0–8.3)

## 2021-02-11 LAB — HEPATITIS C ANTIBODY
Hepatitis C Ab: NONREACTIVE
SIGNAL TO CUT-OFF: 0.02 (ref <1.00)

## 2021-02-11 LAB — LIPID PANEL
Cholesterol: 194 mg/dL (ref 0–200)
HDL: 61.7 mg/dL (ref >39.00)
LDL Cholesterol: 99 mg/dL (ref 0–99)
NonHDL: 132.21
Total CHOL/HDL Ratio: 3
Triglycerides: 168 mg/dL — ABNORMAL HIGH (ref 0.0–149.0)
VLDL: 33.6 mg/dL (ref 0.0–40.0)

## 2021-02-11 LAB — IBC + FERRITIN
Ferritin: 25.1 ng/mL (ref 10.0–291.0)
Iron: 146 ug/dL — ABNORMAL HIGH (ref 42–145)
Saturation Ratios: 37.6 % (ref 20.0–50.0)
Transferrin: 277 mg/dL (ref 212.0–360.0)

## 2021-02-11 LAB — VITAMIN D 25 HYDROXY (VIT D DEFICIENCY, FRACTURES): VITD: 46.81 ng/mL (ref 30.00–100.00)

## 2021-02-11 LAB — HIV ANTIBODY (ROUTINE TESTING W REFLEX): HIV 1&2 Ab, 4th Generation: NONREACTIVE

## 2021-02-12 ENCOUNTER — Other Ambulatory Visit: Payer: Self-pay | Admitting: Family Medicine

## 2021-02-12 ENCOUNTER — Telehealth: Payer: Self-pay | Admitting: Family Medicine

## 2021-02-12 DIAGNOSIS — R790 Abnormal level of blood mineral: Secondary | ICD-10-CM

## 2021-02-12 NOTE — Telephone Encounter (Signed)
Patient states the pharmacy  did not receive  traZODone (DESYREL) 100 MG tablet   And something for ear wax removal

## 2021-02-15 ENCOUNTER — Ambulatory Visit: Payer: Self-pay | Admitting: General Surgery

## 2021-02-15 NOTE — H&P (Signed)
History of Present Illness Ralene Ok MD; 02/15/2021 11:24 AM) The patient is a 65 year old female who presents with a hiatal hernia. Referred by: Dr. Loletha Carrow Chief Complaint: Hiatal hernia, Lysbeth Galas lesions  Patient is a 65 year old female, with a history of arthritis, iron deficiency anemia, who comes in with large hiatal hernia. Patient states she was told several years ago that she had a mild hiatal hernia on endoscopy. Patient recently underwent follow-up endoscopy which revealed her stomach in her chest cavity. 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 Patient was being treated with iron infusion secondary to low iron. Patient also had a positive Hemoccult blood.  I did review the endoscopy report personally. Patient has had no previous CT scan.  Patient had previous C-section the past.    Past Surgical History Emeline Gins, [place]; 02/15/2021 11:03 AM) Cesarean Section - 1  Knee Surgery  Right.  Diagnostic Studies History Emeline Gins, [place]; 02/15/2021 11:02 AM) Colonoscopy  within last year Mammogram  within last year Pap Smear  1-5 years ago  Allergies Emeline Gins, [place]; 02/15/2021 11:04 AM) Penicillins  Allergies Reconciled   Medication History Emeline Gins, CMA; 02/15/2021 11:05 AM) traZODone HCl (100MG  Tablet, Oral) Active. ALPRAZolam (0.25MG  Tablet, Oral) Active. Meloxicam (7.5MG  Tablet, Oral) Active. Montelukast Sodium (10MG  Tablet, Oral) Active. Esomeprazole Magnesium (40MG  Capsule DR, Oral) Active. Methotrexate (2.5MG  Tablet, Oral) Active. Simponi Aria (50MG /4ML Solution, Intravenous) Active. Levocetirizine Dihydrochloride (5MG  Tablet, Oral) Active. Folic Acid (1MG  Tablet, Oral) Active. Medications Reconciled  Social History Emeline Gins, [place]; 02/15/2021 11:03 AM) Caffeine use  Coffee, Tea. Illicit drug use  Recently quit drug use. No alcohol use  Tobacco use  Never smoker.  Family History Emeline Gins, [place]; 02/15/2021 11:03 AM) Arthritis  Father. Cancer  Sister. Diabetes Mellitus  Father. Hypertension  Father, Mother. Melanoma  Sister. Ovarian Cancer  Sister. Respiratory Condition  Mother.  Pregnancy / Birth History Emeline Gins, [place]; 02/15/2021 11:03 AM) Age at menarche  89 years. Age of menopause  52-55 Gravida  2 Irregular periods  Maternal age  65-40 Para  2  Other Problems Emeline Gins, [place]; 02/15/2021 11:03 AM) Anxiety Disorder  Arthritis  Asthma  Back Pain  Diverticulosis  Gastroesophageal Reflux Disease  General anesthesia - complications     Review of Systems Ralene Ok MD; 02/15/2021 11:22 AM) General Not Present- Appetite Loss, Chills, Fatigue, Fever, Night Sweats, Weight Gain and Weight Loss. Skin Not Present- Change in Wart/Mole, Dryness, Hives, Jaundice, New Lesions, Non-Healing Wounds, Rash and Ulcer. HEENT Present- Seasonal Allergies and Wears glasses/contact lenses. Not Present- Earache, Hearing Loss, Hoarseness, Nose Bleed, Oral Ulcers, Ringing in the Ears, Sinus Pain, Sore Throat, Visual Disturbances and Yellow Eyes. Respiratory Present- Snoring. Not Present- Bloody sputum, Chronic Cough, Difficulty Breathing and Wheezing. Breast Not Present- Breast Mass, Breast Pain, Nipple Discharge and Skin Changes. Cardiovascular Not Present- Chest Pain, Difficulty Breathing Lying Down, Leg Cramps, Palpitations, Rapid Heart Rate, Shortness of Breath and Swelling of Extremities. Gastrointestinal Present- Bloody Stool. Not Present- Abdominal Pain, Bloating, Change in Bowel Habits, Chronic diarrhea, Constipation, Difficulty Swallowing, Excessive gas, Gets full quickly at meals, Hemorrhoids, Indigestion, Nausea, Rectal Pain and Vomiting. Female Genitourinary Present- Urgency. Not Present- Frequency, Nocturia, Painful Urination and Pelvic Pain. Musculoskeletal Present- Joint Stiffness and Swelling of Extremities. Not Present- Back Pain,  Joint Pain, Muscle Pain and Muscle Weakness. Neurological Not Present- Decreased Memory, Fainting, Headaches, Numbness, Seizures, Tingling, Tremor, Trouble walking and Weakness. Psychiatric Present- Anxiety. Not Present- Bipolar, Change in Sleep Pattern, Depression, Fearful  and Frequent crying. Endocrine Not Present- Cold Intolerance, Excessive Hunger, Hair Changes, Heat Intolerance, Hot flashes and New Diabetes. Hematology Not Present- Blood Thinners, Easy Bruising, Excessive bleeding, Gland problems, HIV and Persistent Infections. All other systems negative  Vitals Emeline Gins CMA; 02/15/2021 11:03 AM) 02/15/2021 11:03 AM Weight: 180 lb Height: 65in Body Surface Area: 1.89 m Body Mass Index: 29.95 kg/m  Temp.: 33F  Pulse: 119 (Regular)  BP: 128/88(Sitting, Left Arm, Standard)       Assessment & Plan Ralene Ok MD; 02/15/2021 11:25 AM) HIATAL HERNIA (K44.9) Impression: Patient is a 65 year old female, with a history of deficiency anemia, arthritis, comes in with a large hiatal hernia and Cameron's lesions.  1. Will proceed to the operating room for a robotic hiatal hernia repair with mesh and fundoplication  2. Discussed with patient the risks and benefits of the procedure to include but not limited to: Infection, bleeding, damage to structures, possible pneumothorax, possible recurrence. The patient voiced understanding and wishes to proceed.  I reviewed the patient's external notes from the referring physicians as well as consulting physician team. Each of the radiologic studies and lab studies were independently reviewed and interpreted. I discussed the results of the above studies and how they relate to the patient's surgical problems. 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

## 2021-02-15 NOTE — Telephone Encounter (Signed)
PCP sent in

## 2021-02-26 ENCOUNTER — Other Ambulatory Visit (INDEPENDENT_AMBULATORY_CARE_PROVIDER_SITE_OTHER): Payer: 59

## 2021-02-26 ENCOUNTER — Other Ambulatory Visit: Payer: Self-pay

## 2021-02-26 DIAGNOSIS — R790 Abnormal level of blood mineral: Secondary | ICD-10-CM | POA: Diagnosis not present

## 2021-02-26 NOTE — Addendum Note (Signed)
Addended by: Manuela Schwartz on: 02/26/2021 01:55 PM   Modules accepted: Orders

## 2021-02-26 NOTE — Addendum Note (Signed)
Addended by: Manuela Schwartz on: 02/26/2021 01:56 PM   Modules accepted: Orders

## 2021-02-27 LAB — COMPREHENSIVE METABOLIC PANEL
AG Ratio: 1.7 (calc) (ref 1.0–2.5)
ALT: 20 U/L (ref 6–29)
AST: 17 U/L (ref 10–35)
Albumin: 4.3 g/dL (ref 3.6–5.1)
Alkaline phosphatase (APISO): 57 U/L (ref 37–153)
BUN: 10 mg/dL (ref 7–25)
CO2: 27 mmol/L (ref 20–32)
Calcium: 9.6 mg/dL (ref 8.6–10.4)
Chloride: 103 mmol/L (ref 98–110)
Creat: 0.77 mg/dL (ref 0.50–0.99)
Globulin: 2.6 g/dL (calc) (ref 1.9–3.7)
Glucose, Bld: 98 mg/dL (ref 65–99)
Potassium: 4.7 mmol/L (ref 3.5–5.3)
Sodium: 140 mmol/L (ref 135–146)
Total Bilirubin: 0.6 mg/dL (ref 0.2–1.2)
Total Protein: 6.9 g/dL (ref 6.1–8.1)

## 2021-02-27 LAB — IRON,TIBC AND FERRITIN PANEL
%SAT: 27 % (calc) (ref 16–45)
Ferritin: 27 ng/mL (ref 16–288)
Iron: 95 ug/dL (ref 45–160)
TIBC: 353 mcg/dL (calc) (ref 250–450)

## 2021-02-27 LAB — EXTRA LAV TOP TUBE

## 2021-03-01 ENCOUNTER — Other Ambulatory Visit: Payer: Self-pay | Admitting: Family Medicine

## 2021-03-01 ENCOUNTER — Encounter: Payer: Self-pay | Admitting: Family Medicine

## 2021-03-01 DIAGNOSIS — E210 Primary hyperparathyroidism: Secondary | ICD-10-CM | POA: Insufficient documentation

## 2021-03-01 LAB — PTH, INTACT AND CALCIUM
Calcium: 9.8 mg/dL (ref 8.6–10.4)
PTH: 67 pg/mL — ABNORMAL HIGH (ref 14–64)

## 2021-03-02 ENCOUNTER — Telehealth: Payer: Self-pay

## 2021-03-02 ENCOUNTER — Other Ambulatory Visit: Payer: Self-pay

## 2021-03-02 DIAGNOSIS — E210 Primary hyperparathyroidism: Secondary | ICD-10-CM

## 2021-03-02 NOTE — Telephone Encounter (Signed)
Bone density ordered place per Dr.Wendling's request .    "please place a recall for 05/2022 for a bone density scan for [facility], dx hyperparathyroid. Ty. "

## 2021-03-06 ENCOUNTER — Other Ambulatory Visit: Payer: Self-pay | Admitting: Family Medicine

## 2021-03-06 DIAGNOSIS — M1991 Primary osteoarthritis, unspecified site: Secondary | ICD-10-CM

## 2021-03-12 ENCOUNTER — Other Ambulatory Visit: Payer: Self-pay | Admitting: Family

## 2021-03-17 ENCOUNTER — Other Ambulatory Visit: Payer: Self-pay | Admitting: Nurse Practitioner

## 2021-04-12 ENCOUNTER — Other Ambulatory Visit: Payer: Self-pay | Admitting: Family Medicine

## 2021-04-17 ENCOUNTER — Other Ambulatory Visit (HOSPITAL_COMMUNITY): Payer: 59

## 2021-04-22 DIAGNOSIS — E213 Hyperparathyroidism, unspecified: Secondary | ICD-10-CM

## 2021-04-27 ENCOUNTER — Other Ambulatory Visit (HOSPITAL_BASED_OUTPATIENT_CLINIC_OR_DEPARTMENT_OTHER): Payer: Self-pay | Admitting: Family Medicine

## 2021-04-27 DIAGNOSIS — Z1231 Encounter for screening mammogram for malignant neoplasm of breast: Secondary | ICD-10-CM

## 2021-04-27 NOTE — Pre-Procedure Instructions (Signed)
Surgical Instructions:    Your procedure is scheduled on Tuesday 05/04/21 (07:30 AM- 10:00 AM).  Report to [facility] Main Entrance "A" at 05:30 A.M., then check in with the Admitting office.  Call this number if you have any questions prior to, or have any problems the morning of surgery:  [phone]    Remember:  Do not eat after midnight the night before your surgery.  You may drink clear liquids until 04:30 AM the morning of your surgery.   Clear liquids allowed are: Water, Non-Citrus Juices (without pulp), Carbonated Beverages, Clear Tea, Black Coffee Only, and Gatorade.    Enhanced Recovery after Surgery for Orthopedics Enhanced Recovery after Surgery is a protocol used to improve the stress on your body and your recovery after surgery.  Patient Instructions  . The night before surgery:  o No food after midnight. ONLY clear liquids after midnight   . The day of surgery (if you do NOT have diabetes):  o Drink ONE (1) Pre-Surgery Clear Ensure by 04:30 AM the morning of surgery.   o This drink was given to you during your hospital pre-op appointment visit. o Nothing else to drink after completing the Pre-Surgery Clear Ensure.         If you have questions, please contact your surgeon's office.     Take these medicines the morning of surgery, if needed:  albuterol (VENTOLIN HFA) inhaler cycloSPORINE (RESTASIS) ophthalmic emulsion   As of today, STOP taking any Aspirin (unless otherwise instructed by your surgeon), NSAIDs such as meloxicam (MOBIC), Aleve, Naproxen, Ibuprofen, Motrin, Advil, Goody's, BC's, all herbal medications, fish oil, and all vitamins.              Special instructions:   [facility]- Preparing For Surgery  Before surgery, you can play an important role. Because skin is not sterile, your skin needs to be as free of germs as possible. You can reduce the number of germs on your skin by washing with CHG (chlorahexidine gluconate) Soap before  surgery.  CHG is an antiseptic cleaner which kills germs and bonds with the skin to continue killing germs even after washing.    Oral Hygiene is also important to reduce your risk of infection.  Remember - BRUSH YOUR TEETH THE MORNING OF SURGERY WITH YOUR REGULAR TOOTHPASTE  Please do not use if you have an allergy to CHG or antibacterial soaps. If your skin becomes reddened/irritated stop using the CHG.  Do not shave (including legs and underarms) for at least 48 hours prior to first CHG shower. It is OK to shave your face.  Please follow these instructions carefully.   1. Shower the NIGHT BEFORE SURGERY and the MORNING OF SURGERY  2. If you chose to wash your hair, wash your hair first as usual with your normal shampoo.  3. After you shampoo, rinse your hair and body thoroughly to remove the shampoo.  4. Wash Face and genitals (private parts) with your normal soap.   5. Use CHG Soap as you would any other liquid soap. You can apply CHG directly to the skin and wash gently with a scrungie or a clean washcloth.   6. Apply the CHG Soap to your body ONLY FROM THE NECK DOWN.  Do not use on open wounds or open sores. Avoid contact with your eyes, ears, mouth and genitals (private parts). Wash Face and genitals (private parts)  with your normal soap.   7. Wash thoroughly, paying special attention to the area  where your surgery will be performed.  8. Thoroughly rinse your body with warm water from the neck down.  9. DO NOT shower/wash with your normal soap after using and rinsing off the CHG Soap.  10. Pat yourself dry with a CLEAN TOWEL.  11. Wear CLEAN PAJAMAS to bed the night before surgery.  12. Place CLEAN SHEETS on your bed the night before your surgery.  13. DO NOT SLEEP WITH PETS.   Day of Surgery: SHOWER with CHG soap. Brush your teeth WITH YOUR REGULAR TOOTHPASTE. Wear Clean/Comfortable clothing the morning of surgery. Do not apply any deodorants/lotions.   Do not wear  jewelry, make up, or nail polish. Do not shave 48 hours prior to surgery.    Do NOT Smoke (Tobacco/Vaping) or drink Alcohol 24 hours prior to your procedure. Do not bring valuables to the hospital. [facility] is not responsible for any belongings or valuables.  If you use a CPAP at night, you may bring all equipment for your overnight stay.   Contacts, glasses, or dentures may not be worn into surgery, please bring cases for these belongings.   For patients admitted to the hospital, discharge time will be determined by your treatment team.   Patients discharged the day of surgery will not be allowed to drive home, and someone needs to stay with them for 24 hours.    Please read over the following fact sheets that you were given.

## 2021-04-28 ENCOUNTER — Other Ambulatory Visit: Payer: Self-pay

## 2021-04-28 ENCOUNTER — Encounter (HOSPITAL_COMMUNITY)
Admission: RE | Admit: 2021-04-28 | Discharge: 2021-04-28 | Disposition: A | Discharge status: Home / Self Care | Payer: 59 | Source: Ambulatory Visit | Attending: General Surgery | Admitting: General Surgery

## 2021-04-28 ENCOUNTER — Encounter (HOSPITAL_COMMUNITY): Payer: Self-pay

## 2021-04-28 DIAGNOSIS — Z01812 Encounter for preprocedural laboratory examination: Secondary | ICD-10-CM | POA: Insufficient documentation

## 2021-04-28 HISTORY — DX: Anemia, unspecified: D64.9

## 2021-04-28 HISTORY — DX: Other specified postprocedural states: Z98.890

## 2021-04-28 HISTORY — DX: Nausea with vomiting, unspecified: R11.2

## 2021-04-28 LAB — BASIC METABOLIC PANEL
Anion gap: 4 — ABNORMAL LOW (ref 5–15)
BUN: 10 mg/dL (ref 8–23)
CO2: 29 mmol/L (ref 22–32)
Calcium: 9.2 mg/dL (ref 8.9–10.3)
Chloride: 104 mmol/L (ref 98–111)
Creatinine, Ser: 0.76 mg/dL (ref 0.44–1.00)
GFR, Estimated: >60 mL/min (ref >60)
Glucose, Bld: 93 mg/dL (ref 70–99)
Potassium: 4.1 mmol/L (ref 3.5–5.1)
Sodium: 137 mmol/L (ref 135–145)

## 2021-04-28 LAB — CBC
HCT: 39.4 % (ref 36.0–46.0)
Hemoglobin: 12.5 g/dL (ref 12.0–15.0)
MCH: 29.8 pg (ref 26.0–34.0)
MCHC: 31.7 g/dL (ref 30.0–36.0)
MCV: 94 fL (ref 80.0–100.0)
Platelets: 277 10*3/uL (ref 150–400)
RBC: 4.19 MIL/uL (ref 3.87–5.11)
RDW: 14.4 % (ref 11.5–15.5)
WBC: 5.4 10*3/uL (ref 4.0–10.5)
nRBC: 0 % (ref 0.0–0.2)

## 2021-04-28 NOTE — Progress Notes (Signed)
PCP - Riki Sheer Cardiologist - denies  PPM/ICD - denies   Chest x-ray -n/a  EKG - n/a Stress Test -denies  ECHO - denies Cardiac Cath - denies  Sleep Study - denies  As of today, STOP taking any Aspirin (unless otherwise instructed by your surgeon), NSAIDs such as meloxicam (MOBIC), Aleve, Naproxen, Ibuprofen, Motrin, Advil, Goody's, BC's, all herbal medications, fish oil, and all vitamins.  ERAS Protcol -yes PRE-SURGERY Ensure or G2- ensure given  COVID TEST- 04/30/21   Anesthesia review: no  Patient denies shortness of breath, fever, cough and chest pain at PAT appointment   All instructions explained to the patient, with a verbal understanding of the material. Patient agrees to go over the instructions while at home for a better understanding. Patient also instructed to self quarantine after being tested for COVID-19. The opportunity to ask questions was provided.

## 2021-04-30 ENCOUNTER — Other Ambulatory Visit (HOSPITAL_COMMUNITY)
Admission: RE | Admit: 2021-04-30 | Discharge: 2021-04-30 | Disposition: A | Discharge status: Home / Self Care | Payer: 59 | Source: Ambulatory Visit | Attending: General Surgery | Admitting: General Surgery

## 2021-04-30 DIAGNOSIS — Z01812 Encounter for preprocedural laboratory examination: Secondary | ICD-10-CM | POA: Insufficient documentation

## 2021-04-30 DIAGNOSIS — Z20822 Contact with and (suspected) exposure to covid-19: Secondary | ICD-10-CM | POA: Insufficient documentation

## 2021-05-01 LAB — SARS CORONAVIRUS 2 (TAT 6-24 HRS): SARS Coronavirus 2: NEGATIVE

## 2021-05-03 NOTE — H&P (Signed)
History of Present Illness The patient is a 65 year old female who presents with a hiatal hernia. Referred by: Dr. Loletha Carrow Chief Complaint: Hiatal hernia, Melissa Middleton lesions  Patient is a 65 year old female, with a history of arthritis, iron deficiency anemia, who comes in with large hiatal hernia. Patient states she was told several years ago that she had a mild hiatal hernia on endoscopy. Patient recently underwent follow-up endoscopy which revealed her stomach in her chest cavity. 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 Patient was being treated with iron infusion secondary to low iron. Patient also had a positive Hemoccult blood.  I did review the endoscopy report personally. Patient has had no previous CT scan.  Patient had previous C-section the past.    Past Surgical History Cesarean Section - 1  Knee Surgery  Right.  Diagnostic Studies History Colonoscopy  within last year Mammogram  within last year Pap Smear  1-5 years ago  Allergies  Penicillins  Allergies Reconciled   Medication History traZODone HCl (100MG  Tablet, Oral) Active. ALPRAZolam (0.25MG  Tablet, Oral) Active. Meloxicam (7.5MG  Tablet, Oral) Active. Montelukast Sodium (10MG  Tablet, Oral) Active. Esomeprazole Magnesium (40MG  Capsule DR, Oral) Active. Methotrexate (2.5MG  Tablet, Oral) Active. Simponi Aria (50MG /4ML Solution, Intravenous) Active. Levocetirizine Dihydrochloride (5MG  Tablet, Oral) Active. Folic Acid (1MG  Tablet, Oral) Active. Medications Reconciled  Social History  Caffeine use  Coffee, Tea. Illicit drug use  Recently quit drug use. No alcohol use  Tobacco use  Never smoker.  Family History Arthritis  Father. Cancer  Sister. Diabetes Mellitus  Father. Hypertension  Father, Mother. Melanoma  Sister. Ovarian Cancer  Sister. Respiratory Condition  Mother.  Pregnancy / Birth Histor Age at menarche  27 years. Age of menopause   44-55 Gravida  2 Irregular periods  Maternal age  16-40 Para  2  Other Problems Anxiety Disorder  Arthritis  Asthma  Back Pain  Diverticulosis  Gastroesophageal Reflux Disease  General anesthesia - complications     Review of Systems General Not Present- Appetite Loss, Chills, Fatigue, Fever, Night Sweats, Weight Gain and Weight Loss. Skin Not Present- Change in Wart/Mole, Dryness, Hives, Jaundice, New Lesions, Non-Healing Wounds, Rash and Ulcer. HEENT Present- Seasonal Allergies and Wears glasses/contact lenses. Not Present- Earache, Hearing Loss, Hoarseness, Nose Bleed, Oral Ulcers, Ringing in the Ears, Sinus Pain, Sore Throat, Visual Disturbances and Yellow Eyes. Respiratory Present- Snoring. Not Present- Bloody sputum, Chronic Cough, Difficulty Breathing and Wheezing. Breast Not Present- Breast Mass, Breast Pain, Nipple Discharge and Skin Changes. Cardiovascular Not Present- Chest Pain, Difficulty Breathing Lying Down, Leg Cramps, Palpitations, Rapid Heart Rate, Shortness of Breath and Swelling of Extremities. Gastrointestinal Present- Bloody Stool. Not Present- Abdominal Pain, Bloating, Change in Bowel Habits, Chronic diarrhea, Constipation, Difficulty Swallowing, Excessive gas, Gets full quickly at meals, Hemorrhoids, Indigestion, Nausea, Rectal Pain and Vomiting. Female Genitourinary Present- Urgency. Not Present- Frequency, Nocturia, Painful Urination and Pelvic Pain. Musculoskeletal Present- Joint Stiffness and Swelling of Extremities. Not Present- Back Pain, Joint Pain, Muscle Pain and Muscle Weakness. Neurological Not Present- Decreased Memory, Fainting, Headaches, Numbness, Seizures, Tingling, Tremor, Trouble walking and Weakness. Psychiatric Present- Anxiety. Not Present- Bipolar, Change in Sleep Pattern, Depression, Fearful and Frequent crying. Endocrine Not Present- Cold Intolerance, Excessive Hunger, Hair Changes, Heat Intolerance, Hot flashes and New  Diabetes. Hematology Not Present- Blood Thinners, Easy Bruising, Excessive bleeding, Gland problems, HIV and Persistent Infections. All other systems negative  Vitals 02/15/2021 11:03 AM Weight: 180 lb Height: 65in Body Surface Area: 1.89 m Body Mass Index: 29.95 kg/m  Temp.: 68F  Pulse: 119 (Regular)  BP: 128/88(Sitting, Left Arm, Standard)   PE:  Constitutional: No acute distress, conversant, appears states age. Eyes: Anicteric sclerae, moist conjunctiva, no lid lag Lungs: Clear to auscultation bilaterally, normal respiratory effort CV: regular rate and rhythm, no murmurs, no peripheral edema, pedal pulses 2+ GI: Soft, no masses or hepatosplenomegaly, non-tender to palpation Skin: No rashes, palpation reveals normal turgor Psychiatric: appropriate judgment and insight, oriented to person, place, and time     Assessment & Plan  HIATAL HERNIA (K44.9) Impression: Patient is a 65 year old female, with a history of deficiency anemia, arthritis, comes in with a large hiatal hernia and Cameron's lesions.  1. Will proceed to the operating room for a robotic hiatal hernia repair with mesh and fundoplication  2. Discussed with patient the risks and benefits of the procedure to include but not limited to: Infection, bleeding, damage to structures, possible pneumothorax, possible recurrence. The patient voiced understanding and wishes to proceed.  I reviewed the patient's external notes from the referring physicians as well as consulting physician team. Each of the radiologic studies and lab studies were independently reviewed and interpreted. I discussed the results of the above studies and how they relate to the patient's surgical problems. 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

## 2021-05-03 NOTE — Anesthesia Preprocedure Evaluation (Addendum)
Anesthesia Evaluation  Patient identified by MRN, date of birth, ID band Patient awake    History of Anesthesia Complications (+) PONV  Airway Mallampati: III  TM Distance: >3 FB Neck ROM: Limited    Dental  (+) Dental Advisory Given   Pulmonary asthma ,    breath sounds clear to auscultation       Cardiovascular negative cardio ROS   Rhythm:Regular Rate:Normal     Neuro/Psych  Neuromuscular disease    GI/Hepatic Neg liver ROS, hiatal hernia, GERD  ,  Endo/Other  negative endocrine ROS  Renal/GU negative Renal ROS     Musculoskeletal  (+) Arthritis ,   Abdominal   Peds  Hematology negative hematology ROS (+)   Anesthesia Other Findings   Reproductive/Obstetrics                            Lab Results  Component Value Date   WBC 5.4 04/28/2021   HGB 12.5 04/28/2021   HCT 39.4 04/28/2021   MCV 94.0 04/28/2021   PLT 277 04/28/2021   Lab Results  Component Value Date   CREATININE 0.76 04/28/2021   BUN 10 04/28/2021   NA 137 04/28/2021   K 4.1 04/28/2021   CL 104 04/28/2021   CO2 29 04/28/2021    Anesthesia Physical Anesthesia Plan  ASA: II  Anesthesia Plan: General   Post-op Pain Management:    Induction: Inhalational  PONV Risk Score and Plan: 4 or greater and Midazolam, Scopolamine patch - Pre-op, Dexamethasone, Ondansetron and Treatment may vary due to age or medical condition  Airway Management Planned: Oral ETT  Additional Equipment: None  Intra-op Plan:   Post-operative Plan: Extubation in OR  Informed Consent: I have reviewed the patients History and Physical, chart, labs and discussed the procedure including the risks, benefits and alternatives for the proposed anesthesia with the patient or authorized representative who has indicated his/her understanding and acceptance.     Dental advisory given  Plan Discussed with: CRNA  Anesthesia Plan Comments:         Anesthesia Quick Evaluation

## 2021-05-04 ENCOUNTER — Inpatient Hospital Stay (HOSPITAL_COMMUNITY): Payer: 59 | Admitting: Vascular Surgery

## 2021-05-04 ENCOUNTER — Encounter (HOSPITAL_COMMUNITY): Payer: Self-pay | Admitting: General Surgery

## 2021-05-04 ENCOUNTER — Inpatient Hospital Stay (HOSPITAL_COMMUNITY)
Admission: RE | Admit: 2021-05-04 | Discharge: 2021-05-05 | DRG: 328 | Disposition: A | Discharge status: Home / Self Care | Payer: 59 | Attending: General Surgery | Admitting: General Surgery

## 2021-05-04 ENCOUNTER — Encounter (HOSPITAL_COMMUNITY)
Admission: RE | Disposition: A | Discharge status: Home / Self Care | Payer: Self-pay | Source: Home / Self Care | Attending: General Surgery

## 2021-05-04 ENCOUNTER — Other Ambulatory Visit: Payer: Self-pay

## 2021-05-04 DIAGNOSIS — M1990 Unspecified osteoarthritis, unspecified site: Secondary | ICD-10-CM | POA: Diagnosis present

## 2021-05-04 DIAGNOSIS — Z880 Allergy status to penicillin: Secondary | ICD-10-CM

## 2021-05-04 DIAGNOSIS — K219 Gastro-esophageal reflux disease without esophagitis: Secondary | ICD-10-CM | POA: Diagnosis present

## 2021-05-04 DIAGNOSIS — K257 Chronic gastric ulcer without hemorrhage or perforation: Secondary | ICD-10-CM | POA: Diagnosis present

## 2021-05-04 DIAGNOSIS — Z79899 Other long term (current) drug therapy: Secondary | ICD-10-CM

## 2021-05-04 DIAGNOSIS — Z791 Long term (current) use of non-steroidal anti-inflammatories (NSAID): Secondary | ICD-10-CM

## 2021-05-04 DIAGNOSIS — F419 Anxiety disorder, unspecified: Secondary | ICD-10-CM | POA: Diagnosis present

## 2021-05-04 DIAGNOSIS — Z8261 Family history of arthritis: Secondary | ICD-10-CM | POA: Diagnosis not present

## 2021-05-04 DIAGNOSIS — D509 Iron deficiency anemia, unspecified: Secondary | ICD-10-CM | POA: Diagnosis present

## 2021-05-04 DIAGNOSIS — K449 Diaphragmatic hernia without obstruction or gangrene: Secondary | ICD-10-CM | POA: Diagnosis present

## 2021-05-04 DIAGNOSIS — Z79891 Long term (current) use of opiate analgesic: Secondary | ICD-10-CM | POA: Diagnosis not present

## 2021-05-04 DIAGNOSIS — Z9889 Other specified postprocedural states: Secondary | ICD-10-CM | POA: Diagnosis present

## 2021-05-04 HISTORY — PX: INSERTION OF MESH: SHX5868

## 2021-05-04 HISTORY — PX: XI ROBOTIC ASSISTED HIATAL HERNIA REPAIR: SHX6889

## 2021-05-04 SURGERY — REPAIR, HERNIA, HIATAL, ROBOT-ASSISTED
Anesthesia: General | Site: Abdomen

## 2021-05-04 MED ORDER — ONDANSETRON HCL 4 MG/2ML IJ SOLN: 4.0000 mg | Freq: Four times a day (QID) | INTRAMUSCULAR | Status: DC | PRN

## 2021-05-04 MED ORDER — ONDANSETRON HCL 4 MG/2ML IJ SOLN
INTRAMUSCULAR | Status: AC
  Filled 2021-05-04: qty 2

## 2021-05-04 MED ORDER — LIDOCAINE 2% (20 MG/ML) 5 ML SYRINGE
INTRAMUSCULAR | Status: AC
  Filled 2021-05-04: qty 5

## 2021-05-04 MED ORDER — HYDROMORPHONE HCL 1 MG/ML IJ SOLN
1.0000 mg | INTRAMUSCULAR | Status: DC | PRN
  Administered 2021-05-04 – 2021-05-05 (×3): 1 mg via INTRAVENOUS
  Filled 2021-05-04 (×3): qty 1

## 2021-05-04 MED ORDER — BUPIVACAINE-EPINEPHRINE (PF) 0.25% -1:200000 IJ SOLN
INTRAMUSCULAR | Status: AC
  Filled 2021-05-04: qty 30

## 2021-05-04 MED ORDER — ONDANSETRON 4 MG PO TBDP: 4.0000 mg | ORAL_TABLET | Freq: Four times a day (QID) | ORAL | Status: DC | PRN

## 2021-05-04 MED ORDER — AMISULPRIDE (ANTIEMETIC) 5 MG/2ML IV SOLN: 10.0000 mg | Freq: Once | INTRAVENOUS | Status: DC | PRN

## 2021-05-04 MED ORDER — CHLORHEXIDINE GLUCONATE 0.12 % MT SOLN
15.0000 mL | Freq: Once | OROMUCOSAL | Status: AC
  Administered 2021-05-04: 15 mL via OROMUCOSAL
  Filled 2021-05-04: qty 15

## 2021-05-04 MED ORDER — MIDAZOLAM HCL 5 MG/5ML IJ SOLN
INTRAMUSCULAR | Status: DC | PRN
  Administered 2021-05-04: 2 mg via INTRAVENOUS

## 2021-05-04 MED ORDER — FENTANYL CITRATE (PF) 250 MCG/5ML IJ SOLN
INTRAMUSCULAR | Status: AC
  Filled 2021-05-04: qty 5

## 2021-05-04 MED ORDER — SODIUM CHLORIDE 0.9% FLUSH
INTRAVENOUS | Status: DC | PRN
  Administered 2021-05-04 (×2): 10 mL

## 2021-05-04 MED ORDER — PROPOFOL 10 MG/ML IV BOLUS
INTRAVENOUS | Status: DC | PRN
  Administered 2021-05-04: 150 mg via INTRAVENOUS

## 2021-05-04 MED ORDER — SUCCINYLCHOLINE CHLORIDE 200 MG/10ML IV SOSY
PREFILLED_SYRINGE | INTRAVENOUS | Status: AC
  Filled 2021-05-04: qty 10

## 2021-05-04 MED ORDER — KETAMINE HCL 50 MG/5ML IJ SOSY
PREFILLED_SYRINGE | INTRAMUSCULAR | Status: AC
  Filled 2021-05-04: qty 5

## 2021-05-04 MED ORDER — SUGAMMADEX SODIUM 200 MG/2ML IV SOLN
INTRAVENOUS | Status: DC | PRN
  Administered 2021-05-04: 200 mg via INTRAVENOUS

## 2021-05-04 MED ORDER — ACETAMINOPHEN 500 MG PO TABS
1000.0000 mg | ORAL_TABLET | ORAL | Status: AC
  Administered 2021-05-04: 500 mg via ORAL
  Filled 2021-05-04: qty 2

## 2021-05-04 MED ORDER — PHENYLEPHRINE HCL-NACL 10-0.9 MG/250ML-% IV SOLN
INTRAVENOUS | Status: DC | PRN
  Administered 2021-05-04: 40 ug/min via INTRAVENOUS

## 2021-05-04 MED ORDER — ROCURONIUM BROMIDE 10 MG/ML (PF) SYRINGE
PREFILLED_SYRINGE | INTRAVENOUS | Status: AC
  Filled 2021-05-04: qty 10

## 2021-05-04 MED ORDER — CHLORHEXIDINE GLUCONATE CLOTH 2 % EX PADS: 6.0000 | MEDICATED_PAD | Freq: Once | CUTANEOUS | Status: DC

## 2021-05-04 MED ORDER — PHENYLEPHRINE 40 MCG/ML (10ML) SYRINGE FOR IV PUSH (FOR BLOOD PRESSURE SUPPORT)
PREFILLED_SYRINGE | INTRAVENOUS | Status: DC | PRN
  Administered 2021-05-04: 120 ug via INTRAVENOUS
  Administered 2021-05-04: 80 ug via INTRAVENOUS

## 2021-05-04 MED ORDER — MIDAZOLAM HCL 2 MG/2ML IJ SOLN
INTRAMUSCULAR | Status: AC
  Filled 2021-05-04: qty 2

## 2021-05-04 MED ORDER — CEFAZOLIN SODIUM-DEXTROSE 2-4 GM/100ML-% IV SOLN: 2.0000 g | INTRAVENOUS | Status: DC

## 2021-05-04 MED ORDER — DEXAMETHASONE SODIUM PHOSPHATE 10 MG/ML IJ SOLN
4.0000 mg | INTRAMUSCULAR | Status: AC
  Administered 2021-05-04: 10 mg via INTRAVENOUS
  Filled 2021-05-04: qty 1

## 2021-05-04 MED ORDER — LACTATED RINGERS IV SOLN: INTRAVENOUS | Status: DC

## 2021-05-04 MED ORDER — KETAMINE HCL 50 MG/ML IJ SOLN
INTRAMUSCULAR | Status: DC | PRN
  Administered 2021-05-04 (×3): 10 mg via INTRAMUSCULAR

## 2021-05-04 MED ORDER — FENTANYL CITRATE (PF) 250 MCG/5ML IJ SOLN: INTRAMUSCULAR | Status: DC | PRN

## 2021-05-04 MED ORDER — SCOPOLAMINE 1 MG/3DAYS TD PT72
MEDICATED_PATCH | TRANSDERMAL | Status: AC
  Administered 2021-05-04: 1.5 mg via TRANSDERMAL
  Filled 2021-05-04: qty 1

## 2021-05-04 MED ORDER — SUCCINYLCHOLINE CHLORIDE 200 MG/10ML IV SOSY
PREFILLED_SYRINGE | INTRAVENOUS | Status: DC | PRN
  Administered 2021-05-04: 100 mg via INTRAVENOUS

## 2021-05-04 MED ORDER — LACTATED RINGERS IV SOLN: INTRAVENOUS | Status: DC | PRN

## 2021-05-04 MED ORDER — 0.9 % SODIUM CHLORIDE (POUR BTL) OPTIME
TOPICAL | Status: DC | PRN
  Administered 2021-05-04: 1000 mL

## 2021-05-04 MED ORDER — FENTANYL CITRATE (PF) 250 MCG/5ML IJ SOLN
INTRAMUSCULAR | Status: DC | PRN
  Administered 2021-05-04 (×2): 50 ug via INTRAVENOUS

## 2021-05-04 MED ORDER — VANCOMYCIN HCL IN DEXTROSE 1-5 GM/200ML-% IV SOLN
1000.0000 mg | INTRAVENOUS | Status: DC
  Filled 2021-05-04: qty 200

## 2021-05-04 MED ORDER — BUPIVACAINE LIPOSOME 1.3 % IJ SUSP
INTRAMUSCULAR | Status: AC
  Filled 2021-05-04: qty 20

## 2021-05-04 MED ORDER — ORAL CARE MOUTH RINSE: 15.0000 mL | Freq: Once | OROMUCOSAL | Status: AC

## 2021-05-04 MED ORDER — PROPOFOL 10 MG/ML IV BOLUS
INTRAVENOUS | Status: AC
  Filled 2021-05-04: qty 40

## 2021-05-04 MED ORDER — SCOPOLAMINE 1 MG/3DAYS TD PT72
1.0000 | MEDICATED_PATCH | TRANSDERMAL | Status: DC
  Filled 2021-05-04: qty 1

## 2021-05-04 MED ORDER — PHENYLEPHRINE HCL (PRESSORS) 10 MG/ML IV SOLN
INTRAVENOUS | Status: AC
  Filled 2021-05-04: qty 1

## 2021-05-04 MED ORDER — BUPIVACAINE LIPOSOME 1.3 % IJ SUSP
INTRAMUSCULAR | Status: DC | PRN
  Administered 2021-05-04: 20 mL

## 2021-05-04 MED ORDER — ENSURE PRE-SURGERY PO LIQD: 296.0000 mL | Freq: Once | ORAL | Status: DC

## 2021-05-04 MED ORDER — BUPIVACAINE-EPINEPHRINE 0.25% -1:200000 IJ SOLN
INTRAMUSCULAR | Status: DC | PRN
  Administered 2021-05-04: 14 mL

## 2021-05-04 MED ORDER — PANTOPRAZOLE SODIUM 40 MG IV SOLR
40.0000 mg | Freq: Every day | INTRAVENOUS | Status: DC
  Administered 2021-05-04: 40 mg via INTRAVENOUS
  Filled 2021-05-04: qty 40

## 2021-05-04 MED ORDER — FENTANYL CITRATE (PF) 100 MCG/2ML IJ SOLN
25.0000 ug | INTRAMUSCULAR | Status: DC | PRN
  Administered 2021-05-04: 50 ug via INTRAVENOUS

## 2021-05-04 MED ORDER — ROCURONIUM BROMIDE 10 MG/ML (PF) SYRINGE
PREFILLED_SYRINGE | INTRAVENOUS | Status: DC | PRN
  Administered 2021-05-04: 50 mg via INTRAVENOUS
  Administered 2021-05-04: 20 mg via INTRAVENOUS

## 2021-05-04 MED ORDER — DEXTROSE-NACL 5-0.9 % IV SOLN
INTRAVENOUS | Status: DC
  Administered 2021-05-05: 999 mL via INTRAVENOUS

## 2021-05-04 MED ORDER — FENTANYL CITRATE (PF) 100 MCG/2ML IJ SOLN
INTRAMUSCULAR | Status: AC
  Filled 2021-05-04: qty 2

## 2021-05-04 MED ORDER — MIDAZOLAM HCL 2 MG/2ML IJ SOLN
0.5000 mg | INTRAMUSCULAR | Status: AC
  Administered 2021-05-04: 0.5 mg via INTRAVENOUS

## 2021-05-04 MED ORDER — LIDOCAINE 2% (20 MG/ML) 5 ML SYRINGE
INTRAMUSCULAR | Status: DC | PRN
  Administered 2021-05-04: 80 mg via INTRAVENOUS

## 2021-05-04 MED ORDER — LIDOCAINE IN D5W 4-5 MG/ML-% IV SOLN
INTRAVENOUS | Status: DC | PRN
  Administered 2021-05-04: 25 ug/kg/min via INTRAVENOUS

## 2021-05-04 SURGICAL SUPPLY — 55 items
ADH SKN CLS APL DERMABOND .7 (GAUZE/BANDAGES/DRESSINGS) ×1
APL PRP STRL LF DISP 70% ISPRP (MISCELLANEOUS) ×1
CANNULA REDUC XI 12-8 STAPL (CANNULA) ×2
CANNULA REDUCER 12-8 DVNC XI (CANNULA) IMPLANT
CHLORAPREP W/TINT 26 (MISCELLANEOUS) ×2 IMPLANT
COVER MAYO STAND STRL (DRAPES) ×2 IMPLANT
COVER SURGICAL LIGHT HANDLE (MISCELLANEOUS) ×2 IMPLANT
COVER TIP SHEARS 8 DVNC (MISCELLANEOUS) IMPLANT
COVER TIP SHEARS 8MM DA VINCI (MISCELLANEOUS) ×2
DECANTER SPIKE VIAL GLASS SM (MISCELLANEOUS) ×2 IMPLANT
DEFOGGER SCOPE WARMER CLEARIFY (MISCELLANEOUS) ×2 IMPLANT
DERMABOND ADVANCED (GAUZE/BANDAGES/DRESSINGS) ×1
DERMABOND ADVANCED .7 DNX12 (GAUZE/BANDAGES/DRESSINGS) ×1 IMPLANT
DEVICE TROCAR PUNCTURE CLOSURE (ENDOMECHANICALS) ×2 IMPLANT
DRAPE ARM DVNC X/XI (DISPOSABLE) ×4 IMPLANT
DRAPE CARDIOVASC SPLIT 88X140 (DRAPES) ×2 IMPLANT
DRAPE COLUMN DVNC XI (DISPOSABLE) ×1 IMPLANT
DRAPE DA VINCI XI ARM (DISPOSABLE) ×8
DRAPE DA VINCI XI COLUMN (DISPOSABLE) ×2
DRAPE ORTHO SPLIT 77X108 STRL (DRAPES) ×2
DRAPE SURG ORHT 6 SPLT 77X108 (DRAPES) ×1 IMPLANT
ELECT REM PT RETURN 9FT ADLT (ELECTROSURGICAL) ×2
ELECTRODE REM PT RTRN 9FT ADLT (ELECTROSURGICAL) ×1 IMPLANT
GLOVE BIO SURGEON STRL SZ7.5 (GLOVE) ×4 IMPLANT
GOWN STRL REUS W/ TWL LRG LVL3 (GOWN DISPOSABLE) ×1 IMPLANT
GOWN STRL REUS W/ TWL XL LVL3 (GOWN DISPOSABLE) ×2 IMPLANT
GOWN STRL REUS W/TWL 2XL LVL3 (GOWN DISPOSABLE) ×2 IMPLANT
GOWN STRL REUS W/TWL LRG LVL3 (GOWN DISPOSABLE) ×2
GOWN STRL REUS W/TWL XL LVL3 (GOWN DISPOSABLE) ×2
KIT BASIN OR (CUSTOM PROCEDURE TRAY) ×2 IMPLANT
KIT TURNOVER KIT B (KITS) ×1 IMPLANT
MARKER SKIN DUAL TIP RULER LAB (MISCELLANEOUS) ×2 IMPLANT
MESH BIO-A 7X10 SYN MAT (Mesh General) ×2 IMPLANT
NDL INSUFFLATION 14GA 120MM (NEEDLE) ×1 IMPLANT
NEEDLE 22X1 1/2 (OR ONLY) (NEEDLE) ×2 IMPLANT
NEEDLE INSUFFLATION 14GA 120MM (NEEDLE) ×2 IMPLANT
OBTURATOR OPTICAL STANDARD 8MM (TROCAR)
OBTURATOR OPTICAL STND 8 DVNC (TROCAR)
OBTURATOR OPTICALSTD 8 DVNC (TROCAR) IMPLANT
SCISSORS LAP 5X35 DISP (ENDOMECHANICALS) IMPLANT
SEAL CANN UNIV 5-8 DVNC XI (MISCELLANEOUS) ×3 IMPLANT
SEAL XI 5MM-8MM UNIVERSAL (MISCELLANEOUS) ×6
SEALER VESSEL DA VINCI XI (MISCELLANEOUS) ×2
SEALER VESSEL EXT DVNC XI (MISCELLANEOUS) ×1 IMPLANT
SET IRRIG TUBING LAPAROSCOPIC (IRRIGATION / IRRIGATOR) ×2 IMPLANT
SET TUBE SMOKE EVAC HIGH FLOW (TUBING) ×2 IMPLANT
STAPLER CANNULA SEAL DVNC XI (STAPLE) ×1 IMPLANT
STAPLER CANNULA SEAL XI (STAPLE) ×2
STOPCOCK 4 WAY LG BORE MALE ST (IV SETS) ×2 IMPLANT
SUT ETHIBOND 0 36 GRN (SUTURE) ×4 IMPLANT
SUT MNCRL AB 4-0 PS2 18 (SUTURE) ×2 IMPLANT
SUT SILK 0 SH 30 (SUTURE) ×4 IMPLANT
SYR 30ML SLIP (SYRINGE) ×2 IMPLANT
TRAY LAPAROSCOPIC MC (CUSTOM PROCEDURE TRAY) ×2 IMPLANT
TROCAR ADV FIXATION 5X100MM (TROCAR) ×2 IMPLANT

## 2021-05-04 NOTE — Interval H&P Note (Signed)
History and Physical Interval Note:  05/04/2021 7:01 AM  Melissa Middleton  has presented today for surgery, with the diagnosis of HIATAL HERNIA.  The various methods of treatment have been discussed with the patient and family. After consideration of risks, benefits and other options for treatment, the patient has consented to  Procedure(s): XI ROBOTIC ASSISTED HIATAL HERNIA REPAIR WITH MESH AND FUNDOPLICATION (N/A) as a surgical intervention.  The patient's history has been reviewed, patient examined, no change in status, stable for surgery.  I have reviewed the patient's chart and labs.  Questions were answered to the patient's satisfaction.     Ralene Ok

## 2021-05-04 NOTE — Discharge Instructions (Signed)
EATING AFTER YOUR ESOPHAGEAL SURGERY (Stomach Fundoplication, Hiatal Hernia repair, Achalasia surgery, etc)  ######################################################################  EAT Start with a pureed / full liquid diet (see below) Gradually transition to a high fiber diet with a fiber supplement over the next month after discharge.    WALK Walk an hour a day.  Control your pain to do that.    CONTROL PAIN Control pain so that you can walk, sleep, tolerate sneezing/coughing, go up/down stairs.  HAVE A BOWEL MOVEMENT DAILY Keep your bowels regular to avoid problems.  OK to try a laxative to override constipation.  OK to use an antidairrheal to slow down diarrhea.  Call if not better after 2 tries  CALL IF YOU HAVE PROBLEMS/CONCERNS Call if you are still struggling despite following these instructions. Call if you have concerns not answered by these instructions  ######################################################################   After your esophageal surgery, expect some sticking with swallowing over the next 1-2 months.    If food sticks when you eat, it is called "dysphagia".  This is due to swelling around your esophagus at the wrap & hiatal diaphragm repair.  It will gradually ease off over the next few months.  To help you through this temporary phase, we start you out on a pureed (blenderized) diet.  Your first meal in the hospital was thin liquids.  You should have been given a pureed diet by the time you left the hospital.  We ask patients to stay on a pureed diet for the first 2-3 weeks to avoid anything getting "stuck" near your recent surgery.  Don't be alarmed if your ability to swallow doesn't progress according to this plan.  Everyone is different and some diets can advance more or less quickly.    It is often helpful to crush your medications or split them as they can sometimes stick, especially the first week or so.   Some BASIC RULES to follow  are:  Maintain an upright position whenever eating or drinking.  Take small bites - just a teaspoon size bite at a time.  Eat slowly.  It may also help to eat only one food at a time.  Consider nibbling through smaller, more frequent meals & avoid the urge to eat BIG meals  Do not push through feelings of fullness, nausea, or bloatedness  Do not mix solid foods and liquids in the same mouthful  Try not to "wash foods down" with large gulps of liquids.  Avoid carbonated (bubbly/fizzy) drinks.    Avoid foods that make you feel gassy or bloated.  Start with bland foods first.  Wait on trying greasy, fried, or spicy meals until you are tolerating more bland solids well.  Understand that it will be hard to burp and belch at first.  This gradually improves with time.  Expect to be more gassy/flatulent/bloated initially.  Walking will help your body manage it better.  Consider using medications for bloating that contain simethicone such as  Maalox or Gas-X   Consider crushing her medications, especially smaller pills.  The ability to swallow pills should get easier after a few weeks  Eat in a relaxed atmosphere & minimize distractions.  Avoid talking while eating.    Do not use straws.  Following each meal, sit in an upright position (90 degree angle) for 60 to 90 minutes.  Going for a short walk can help as well  If food does stick, don't panic.  Try to relax and let the food pass on its own.    Sipping WARM LIQUID such as strong hot black tea can also help slide it down.   Be gradual in changes & use common sense:  -If you easily tolerating a certain "level" of foods, advance to the next level gradually -If you are having trouble swallowing a particular food, then avoid it.   -If food is sticking when you advance your diet, go back to thinner previous diet (the lower LEVEL) for 1-2 days.  LEVEL 1 = PUREED DIET  Do for the first 2 WEEKS AFTER SURGERY  -Foods in this group are  pureed or blenderized to a smooth, mashed potato-like consistency.  -If necessary, the pureed foods can keep their shape with the addition of a thickening agent.   -Meat should be pureed to a smooth, pasty consistency.  Hot broth or gravy may be added to the pureed meat, approximately 1 oz. of liquid per 3 oz. serving of meat. -CAUTION:  If any foods do not puree into a smooth consistency, swallowing will be more difficult.  (For example, nuts or seeds sometimes do not blend well.)  Hot Foods Cold Foods  Pureed scrambled eggs and cheese Pureed cottage cheese  Baby cereals Thickened juices and nectars  Thinned cooked cereals (no lumps) Thickened milk or eggnog  Pureed French toast or pancakes Ensure  Mashed potatoes Ice cream  Pureed parsley, au gratin, scalloped potatoes, candied sweet potatoes Fruit or Italian ice, sherbet  Pureed buttered or alfredo noodles Plain yogurt  Pureed vegetables (no corn or peas) Instant breakfast  Pureed soups and creamed soups Smooth pudding, mousse, custard  Pureed scalloped apples Whipped gelatin  Gravies Sugar, syrup, honey, jelly  Sauces, cheese, tomato, barbecue, white, creamed Cream  Any baby food Creamer  Alcohol in moderation (not beer or champagne) Margarine  Coffee or tea Mayonnaise   Ketchup, mustard   Apple sauce   SAMPLE MENU:  PUREED DIET Breakfast Lunch Dinner   Orange juice, 1/2 cup  Cream of wheat, 1/2 cup  Pineapple juice, 1/2 cup  Pureed turkey, barley soup, 3/4 cup  Pureed Hawaiian chicken, 3 oz   Scrambled eggs, mashed or blended with cheese, 1/2 cup  Tea or coffee, 1 cup   Whole milk, 1 cup   Non-dairy creamer, 2 Tbsp.  Mashed potatoes, 1/2 cup  Pureed cooled broccoli, 1/2 cup  Apple sauce, 1/2 cup  Coffee or tea  Mashed potatoes, 1/2 cup  Pureed spinach, 1/2 cup  Frozen yogurt, 1/2 cup  Tea or coffee      LEVEL 2 = SOFT DIET  After your first 2 weeks, you can advance to a soft diet.   Keep on this  diet until everything goes down easily.  Hot Foods Cold Foods  White fish Cottage cheese  Stuffed fish Junior baby fruit  Baby food meals Semi thickened juices  Minced soft cooked, scrambled, poached eggs nectars  Souffle & omelets Ripe mashed bananas  Cooked cereals Canned fruit, pineapple sauce, milk  potatoes Milkshake  Buttered or Alfredo noodles Custard  Cooked cooled vegetable Puddings, including tapioca  Sherbet Yogurt  Vegetable soup or alphabet soup Fruit ice, Italian ice  Gravies Whipped gelatin  Sugar, syrup, honey, jelly Junior baby desserts  Sauces:  Cheese, creamed, barbecue, tomato, white Cream  Coffee or tea Margarine   SAMPLE MENU:  LEVEL 2 Breakfast Lunch Dinner   Orange juice, 1/2 cup  Oatmeal, 1/2 cup  Scrambled eggs with cheese, 1/2 cup  Decaffeinated tea, 1 cup  Whole milk, 1 cup    Non-dairy creamer, 2 Tbsp  Pineapple juice, 1/2 cup  Minced beef, 3 oz  Gravy, 2 Tbsp  Mashed potatoes, 1/2 cup  Minced fresh broccoli, 1/2 cup  Applesauce, 1/2 cup  Coffee, 1 cup  Turkey, barley soup, 3/4 cup  Minced Hawaiian chicken, 3 oz  Mashed potatoes, 1/2 cup  Cooked spinach, 1/2 cup  Frozen yogurt, 1/2 cup  Non-dairy creamer, 2 Tbsp      LEVEL 3 = CHOPPED DIET  -After all the foods in level 2 (soft diet) are passing through well you should advance up to more chopped foods.  -It is still important to cut these foods into small pieces and eat slowly.  Hot Foods Cold Foods  Poultry Cottage cheese  Chopped Swedish meatballs Yogurt  Meat salads (ground or flaked meat) Milk  Flaked fish (tuna) Milkshakes  Poached or scrambled eggs Soft, cold, dry cereal  Souffles and omelets Fruit juices or nectars  Cooked cereals Chopped canned fruit  Chopped French toast or pancakes Canned fruit cocktail  Noodles or pasta (no rice) Pudding, mousse, custard  Cooked vegetables (no frozen peas, corn, or mixed vegetables) Green salad  Canned small sweet peas  Ice cream  Creamed soup or vegetable soup Fruit ice, Italian ice  Pureed vegetable soup or alphabet soup Non-dairy creamer  Ground scalloped apples Margarine  Gravies Mayonnaise  Sauces:  Cheese, creamed, barbecue, tomato, white Ketchup  Coffee or tea Mustard   SAMPLE MENU:  LEVEL 3 Breakfast Lunch Dinner   Orange juice, 1/2 cup  Oatmeal, 1/2 cup  Scrambled eggs with cheese, 1/2 cup  Decaffeinated tea, 1 cup  Whole milk, 1 cup  Non-dairy creamer, 2 Tbsp  Ketchup, 1 Tbsp  Margarine, 1 tsp  Salt, 1/4 tsp  Sugar, 2 tsp  Pineapple juice, 1/2 cup  Ground beef, 3 oz  Gravy, 2 Tbsp  Mashed potatoes, 1/2 cup  Cooked spinach, 1/2 cup  Applesauce, 1/2 cup  Decaffeinated coffee  Whole milk  Non-dairy creamer, 2 Tbsp  Margarine, 1 tsp  Salt, 1/4 tsp  Pureed turkey, barley soup, 3/4 cup  Barbecue chicken, 3 oz  Mashed potatoes, 1/2 cup  Ground fresh broccoli, 1/2 cup  Frozen yogurt, 1/2 cup  Decaffeinated tea, 1 cup  Non-dairy creamer, 2 Tbsp  Margarine, 1 tsp  Salt, 1/4 tsp  Sugar, 1 tsp    LEVEL 4:  REGULAR FOODS  -Foods in this group are soft, moist, regularly textured foods.   -This level includes meat and breads, which tend to be the hardest things to swallow.   -Eat very slowly, chew well and continue to avoid carbonated drinks. -most people are at this level in 4-6 weeks  Hot Foods Cold Foods  Baked fish or skinned Soft cheeses - cottage cheese  Souffles and omelets Cream cheese  Eggs Yogurt  Stuffed shells Milk  Spaghetti with meat sauce Milkshakes  Cooked cereal Cold dry cereals (no nuts, dried fruit, coconut)  French toast or pancakes Crackers  Buttered toast Fruit juices or nectars  Noodles or pasta (no rice) Canned fruit  Potatoes (all types) Ripe bananas  Soft, cooked vegetables (no corn, lima, or baked beans) Peeled, ripe, fresh fruit  Creamed soups or vegetable soup Cakes (no nuts, dried fruit, coconut)  Canned chicken  noodle soup Plain doughnuts  Gravies Ice cream  Bacon dressing Pudding, mousse, custard  Sauces:  Cheese, creamed, barbecue, tomato, white Fruit ice, Italian ice, sherbet  Decaffeinated tea or coffee Whipped gelatin  Pork chops Regular gelatin     Canned fruited gelatin molds   Sugar, syrup, honey, jam, jelly   Cream   Non-dairy   Margarine   Oil   Mayonnaise   Ketchup   Mustard   TROUBLESHOOTING IRREGULAR BOWELS  1) Avoid extremes of bowel movements (no bad constipation/diarrhea)  2) Miralax 17gm mixed in 8oz. water or juice-daily. May use BID as needed.  3) Gas-x,Phazyme, etc. as needed for gas & bloating.  4) Soft,bland diet. No spicy,greasy,fried foods.  5) Prilosec over-the-counter as needed  6) May hold gluten/wheat products from diet to see if symptoms improve.  7) May try probiotics (Align, Activa, etc) to help calm the bowels down  7) If symptoms become worse call back immediately.    If you have any questions please call our office at [facility]: [phone].  

## 2021-05-04 NOTE — Anesthesia Procedure Notes (Signed)
Procedure Name: Intubation Date/Time: 05/04/2021 7:39 AM Performed by: Jonna Munro, CRNA Pre-anesthesia Checklist: Patient identified, Emergency Drugs available, Suction available, Patient being monitored and Timeout performed Patient Re-evaluated:Patient Re-evaluated prior to induction Oxygen Delivery Method: Circle system utilized Preoxygenation: Pre-oxygenation with 100% oxygen Induction Type: IV induction, Cricoid Pressure applied and Rapid sequence Ventilation: Mask ventilation without difficulty Laryngoscope Size: Glidescope and 3 Grade View: Grade I Tube type: Oral Tube size: 7.0 mm Number of attempts: 1 Airway Equipment and Method: Stylet and Video-laryngoscopy Placement Confirmation: ETT inserted through vocal cords under direct vision,  positive ETCO2 and breath sounds checked- equal and bilateral Secured at: 21 cm Tube secured with: Tape Dental Injury: Teeth and Oropharynx as per pre-operative assessment  Difficulty Due To: Difficult Airway- due to limited oral opening

## 2021-05-04 NOTE — Progress Notes (Signed)
Dr Deatra Canter daw pt. Feels her reactions and feelings are related to ketamine. Orders rec'd fopr midazolam if needed

## 2021-05-04 NOTE — Progress Notes (Signed)
I just feel weird and confused, states patient. Restless. Reassured. Melissa Middleton answers all questions correctly, follows all commands, jokes and laugghs with staff but reiterates she has never felt this way after surgery.

## 2021-05-04 NOTE — Op Note (Signed)
05/04/2021  9:23 AM  PATIENT:  Melissa Middleton  65 y.o. female  PRE-OPERATIVE DIAGNOSIS:  HIATAL HERNIA, CAMERON'S ULCERS, ANEMIA  POST-OPERATIVE DIAGNOSIS:  HIATAL HERNIA  PROCEDURE:  Procedure(s): XI ROBOTIC ASSISTED HIATAL HERNIA REPAIR WITH MESH AND FUNDOPLICATION (N/A) INSERTION OF MESH (N/A)  SURGEON:  Surgeon(s) and Role:    * Ralene Ok, MD - Primary  ASSISTANTS: Pryor Curia, RNFA   ANESTHESIA:   local, regional and general  EBL:  minimal   BLOOD ADMINISTERED:none  DRAINS: none   LOCAL MEDICATIONS USED:  BUPIVICAINE  and OTHER exparel  SPECIMEN:  No Specimen  DISPOSITION OF SPECIMEN:  N/A  COUNTS:  YES  TOURNIQUET:  * No tourniquets in log *  DICTATION: .Dragon Dictation The patient was taken back to the operating room and placed in the supine position with bilateral SCDs in place. The patient was prepped and draped in the usual sterile fashion. After appropriate antibiotics were confirmed a timeout was called and all facts were verified.   A Veress needle technique was used to insufflate the abdomen to 15 mm of mercury the paramedian stab incision. Subsequent to this an 8 mm trocar was introduced as was a 8 millimeter camera. At this time the subsequent robotic trochars x3, were then placed adjacent to this trocar approximately 8-10 cm away. Each trocar was inserted under direct visualization, there were total of 4 trochars. The assistant trocar was then placed in the right lower quadrant under direct visualization. The Nathanson retractor was then visualized inserted into the abdomen and the incision just to the left of the falciform ligament. This was then placed to retract the liver appropriately. At this time the patient was positioned in reverse Trendelenburg.   At this time the robot patient cart was brought to the bedside and placed in good position and the arms were docked to the trochars appropriately. At this time I proceeded to incised the  gastrohepatic ligament.  At this time I proceeded to mobilize the stomach inferiorly and visualize the right crus. The peritoneum over the right crus was incised and right crus was identified. I proceeded to dissect this inferiorly until the left crus was seen joining the right crus. Once the right crus was adequately dissected we turned our to the left crus which was dissected away. This required traction of the stomach to the right side. Once this was visualized we then proceeded to circumferentially dissect the esophagus away from the surrounding tissue. The anterior and posterior vagus was seen along the esophagus at the GE junction.  These were both preserved throughout the entire case.  At this time the phrenoesophageal fat pad was dissected away from the esophagus. There was a large-sized hiatal hernia seen. I mobilized the esophagus cephalad approximately 4-5 cm, clearing away the surrounding tissue. The anterior hernia sac was dissected away from the stomach and esophagus.  At this time we turned our attention to the greater curvature the stomach and the omentum was mobilized using the robotic vessel sealer. This was taken up to the greater curvature to the hiatus. This mobilized the entire greater curvature to allow mobilization and the wrap. I then proceeded to bring the greater curvature the stomach posterior to the esophagus, and a shoeshine technique was used to evaluate the mobilization of the greater curvature.   At this time I proceeded to close the hiatus using interrupted 0 Ethibonds x 4. This brought together the hiatal closure without undue stricture to the esophagus.   A piece  of [employer] A hiatal mesh was placed over the hiatal closure and sutured to the crus using 0 Ethibonds x 4.  At this time the greater curvature was brought around the esophagus and sutured using 0 silk sutures interrupted fashion approximately 1 cm apart x3 on each side of the esophagus in a Toupet fashion. A left  collar stitch was then used to gastropexy the stomach from the wrap to the diaphragm just lateral to the left crus as.  A second collar stitch was placed from the wrap to the right crus. The wrap laywith undue tension.  The wrap lay loose with no strangulation of the esophagus.  At this time the robot was undocked. The liver trocar was removed. At this time insufflation was evacuated. Skin was reapproximated for Monocryl subcuticular fashion. The skin was then dressed with Dermabond. The patient tolerated the procedure well and was taken to the recovery room in stable condition.    PLAN OF CARE: Admit for overnight observation  PATIENT DISPOSITION:  PACU - hemodynamically stable.   Delay start of Pharmacological VTE agent (>24hrs) due to surgical blood loss or risk of bleeding: yes

## 2021-05-04 NOTE — Transfer of Care (Signed)
Immediate Anesthesia Transfer of Care Note  Patient: Melissa Middleton  Procedure(s) Performed: XI ROBOTIC ASSISTED HIATAL HERNIA REPAIR WITH MESH AND FUNDOPLICATION (N/A Abdomen) INSERTION OF MESH (N/A Abdomen)  Patient Location: PACU  Anesthesia Type:General  Level of Consciousness: awake, alert , oriented and patient cooperative  Airway & Oxygen Therapy: Patient Spontanous Breathing and Patient connected to face mask oxygen  Post-op Assessment: Report given to RN, Post -op Vital signs reviewed and stable and Patient moving all extremities X 4  Post vital signs: Reviewed and stable  Last Vitals:  Vitals Value Taken Time  BP 144/87 05/04/21 0944  Temp    Pulse 89 05/04/21 0946  Resp 14 05/04/21 0946  SpO2 97 % 05/04/21 0946  Vitals shown include unvalidated device data.  Last Pain:  Vitals:   05/04/21 0613  TempSrc:   PainSc: 0-No pain         Complications: No complications documented.

## 2021-05-05 ENCOUNTER — Encounter (HOSPITAL_COMMUNITY): Payer: Self-pay | Admitting: General Surgery

## 2021-05-05 ENCOUNTER — Inpatient Hospital Stay (HOSPITAL_COMMUNITY): Payer: 59

## 2021-05-05 ENCOUNTER — Encounter (HOSPITAL_COMMUNITY): Payer: Self-pay | Admitting: *Deleted

## 2021-05-05 ENCOUNTER — Encounter (HOSPITAL_COMMUNITY): Payer: Self-pay

## 2021-05-05 LAB — BASIC METABOLIC PANEL
Anion gap: 7 (ref 5–15)
BUN: 8 mg/dL (ref 8–23)
CO2: 24 mmol/L (ref 22–32)
Calcium: 8.6 mg/dL — ABNORMAL LOW (ref 8.9–10.3)
Chloride: 107 mmol/L (ref 98–111)
Creatinine, Ser: 0.76 mg/dL (ref 0.44–1.00)
GFR, Estimated: >60 mL/min (ref >60)
Glucose, Bld: 130 mg/dL — ABNORMAL HIGH (ref 70–99)
Potassium: 3.6 mmol/L (ref 3.5–5.1)
Sodium: 138 mmol/L (ref 135–145)

## 2021-05-05 MED ORDER — HYDROCODONE-ACETAMINOPHEN 7.5-325 MG/15ML PO SOLN: 15.0000 mL | Freq: Four times a day (QID) | ORAL | 0 refills | Status: DC | PRN

## 2021-05-05 MED ORDER — DIATRIZOATE MEGLUMINE & SODIUM 66-10 % PO SOLN
120.0000 mL | Freq: Once | ORAL | Status: AC
  Administered 2021-05-05: 120 mL via ORAL
  Filled 2021-05-05: qty 120

## 2021-05-05 NOTE — Progress Notes (Signed)
1 Day Post-Op   Subjective/Chief Complaint: Pt doing well this AM Some soreness Hasn't mobilized   Objective: Vital signs in last 24 hours: Temp:  [98.6 F (37 C)-99 F (37.2 C)] 99 F (37.2 C) (05/18 0620) Pulse Rate:  [53-71] 54 (05/18 0620) Resp:  [18-20] 20 (05/18 0620) BP: (99-148)/(56-73) 99/57 (05/18 0620) SpO2:  [96 %-100 %] 96 % (05/18 0620) Last BM Date: 05/03/21  Intake/Output from previous day: 05/17 0701 - 05/18 0700 In: 1115.5 [I.V.:1115.5] Out: 10 [Blood:10] Intake/Output this shift: No intake/output data recorded.  General appearance: alert and cooperative GI: soft, non-tender; bowel sounds normal; no masses,  no organomegaly and inc c/d/i  Lab Results:  No results for input(s): WBC, HGB, HCT, PLT in the last 72 hours. BMET Recent Labs    05/05/21 0128  NA 138  K 3.6  CL 107  CO2 24  GLUCOSE 130*  BUN 8  CREATININE 0.76  CALCIUM 8.6*   PT/INR No results for input(s): LABPROT, INR in the last 72 hours. ABG No results for input(s): PHART, HCO3 in the last 72 hours.  Invalid input(s): PCO2, PO2  Studies/Results: DG ESOPHAGUS W SINGLE CM (SOL OR THIN BA)  Result Date: 05/05/2021 CLINICAL DATA:  Status post Nissen fundoplication (without gastrostomy tube) procedure. EXAM: ESOPHOGRAM/BARIUM SWALLOW TECHNIQUE: Single contrast examination was performed using water-soluble contrast media. FLUOROSCOPY TIME:  Fluoroscopy Time:  1 minutes, 30 seconds. Radiation Exposure Index (if provided by the fluoroscopic device): 42.60 Number of Acquired Spot Images: 5 COMPARISON:  Chest radiograph 10/27/2020. FINDINGS: A problem-oriented water-soluble contrast esophagram was performed to assess for leak or obstruction status post Nissen fundoplication. Expected post fundoplication appearance with narrowing at the level of the distal esophagus/GE junction. Contrast passes from the distal esophagus into the stomach after a slight delay. This may be secondary to  postoperative edema. No extraluminal contrast is demonstrated to suggest leak. IMPRESSION: Expected post fundoplication appearance of the distal esophagus/GE junction. No evidence of leak status. Contrast passes from the distal esophagus into the stomach after a slight delay. This may be secondary to postoperative edema. Electronically Signed   By: Kellie Simmering DO   On: 05/05/2021 09:14    Anti-infectives: Anti-infectives (From admission, onward)   Start     Dose/Rate Route Frequency Ordered Stop   05/04/21 0600  vancomycin (VANCOCIN) IVPB 1000 mg/200 mL premix  Status:  Discontinued        1,000 mg 200 mL/hr over 60 Minutes Intravenous On call to O.R. 05/04/21 0554 05/04/21 1107   05/04/21 0600  ceFAZolin (ANCEF) IVPB 2g/100 mL premix  Status:  Discontinued        2 g 200 mL/hr over 30 Minutes Intravenous On call to O.R. 05/04/21 0554 05/04/21 0602      Assessment/Plan: s/p Procedure(s): XI ROBOTIC ASSISTED HIATAL HERNIA REPAIR WITH MESH AND FUNDOPLICATION (N/A) INSERTION OF MESH (N/A) Advance diet to clears [place] later today if doing well  LOS: 1 day    Ralene Ok 05/05/2021

## 2021-05-05 NOTE — Progress Notes (Incomplete)
  May 05, 2021  Patient: Melissa Middleton  Date of Birth: 1956-02-21  Date of Visit:     To Whom It May Concern:  Mr Tiyah Zelenak is a caregiver for Shamia Uppal during her hospitalization 5/17 and 05/05/21. Thank you very much. Sincerely,  Hulan Amato, RN

## 2021-05-05 NOTE — Anesthesia Postprocedure Evaluation (Signed)
Anesthesia Post Note  Patient: Melissa Middleton  Procedure(s) Performed: XI ROBOTIC ASSISTED HIATAL HERNIA REPAIR WITH MESH AND FUNDOPLICATION (N/A Abdomen) INSERTION OF MESH (N/A Abdomen)     Patient location during evaluation: PACU Anesthesia Type: General Level of consciousness: awake and alert Pain management: pain level controlled Vital Signs Assessment: post-procedure vital signs reviewed and stable Respiratory status: spontaneous breathing, nonlabored ventilation, respiratory function stable and patient connected to nasal cannula oxygen Cardiovascular status: blood pressure returned to baseline and stable Postop Assessment: no apparent nausea or vomiting Anesthetic complications: no   No complications documented.  Last Vitals:  Vitals:   05/04/21 2150 05/05/21 0620  BP: (!) 116/56 (!) 99/57  Pulse: (!) 53 (!) 54  Resp: 19 20  Temp: 37 C 37.2 C  SpO2: 98% 96%    Last Pain:  Vitals:   05/05/21 0620  TempSrc: Oral  PainSc:                  [name]

## 2021-05-05 NOTE — Progress Notes (Signed)
  May 05, 2021  Patient: Melissa Middleton  Date of Birth: 09/26/1956  Date of Visit:     To Whom It May Concern:  Mr Mark Pulis is a caregiver for [name] during her hospitalization 5/17 and 05/05/21. Thank you very much. Sincerely,  Jill Moore, RN         

## 2021-05-05 NOTE — Discharge Summary (Signed)
Physician Discharge Summary  Patient ID: Melissa Middleton MRN: [id number] DOB/AGE: 08-17-1956 65 y.o.  Admit date: 05/04/2021 Discharge date: 05/05/2021  Admission Diagnoses: Hiatal hernia, Cameron's ulcers  Discharge Diagnoses:  Active Problems:   S/P Nissen fundoplication (without gastrostomy tube) procedure   Discharged Condition: good  Hospital Course: Patient is a 65 year old female, comes in secondary to history of anemia and Cameron's ulcers.  Patient with very large hiatal hernia.  Patient underwent above surgery.  Please see op note for full details. Postop patient was sent to the floor.  Postop day 1 patient underwent esophagram.  This was negative for any leak.  Patient was started on a liquid diet and tolerated that well.  Patient was otherwise ambulating well on her own.  Had good pain control.  Was detailed for discharge and discharged home.  Consults: None  Significant Diagnostic Studies: Esophagram without leak  Treatments: surgery: As above  Discharge Exam: Blood pressure 126/67, pulse 91, temperature 98.7 F (37.1 C), temperature source Oral, resp. rate 20, height 5\' 3"  (1.6 m), weight 81.6 kg, SpO2 94 %. General appearance: alert and cooperative GI: soft, non-tender; bowel sounds normal; no masses,  no organomegaly and Incision clean dry and intact.  Disposition: Discharge disposition: 01-Home or Self Care       Discharge Instructions    Diet - low sodium heart healthy   Complete by: As directed    Increase activity slowly   Complete by: As directed      Allergies as of 05/05/2021      Reactions   Macrodantin [nitrofurantoin Macrocrystal] Itching, Rash   Cefuroxime Axetil Rash, Other (See Comments)   REACTION: red face/rash   Flavoring Agent Itching, Rash   Penicillins Rash   Tolerated Ancef intra op 05/04/2021   Shellfish Allergy Itching, Rash   Redness      Medication List    TAKE these medications   acetaminophen 500 MG tablet Commonly  known as: TYLENOL Take 500 mg by mouth every 6 (six) hours as needed.   albuterol 108 (90 Base) MCG/ACT inhaler Commonly known as: VENTOLIN HFA Inhale 2 puffs into the lungs every 4 (four) hours as needed for wheezing or shortness of breath.   ALPRAZolam 0.25 MG tablet Commonly known as: XANAX TAKE 1 TABLET AT BEDTIME AS NEEDED FOR ANXIETY What changed: See the new instructions.   cycloSPORINE 0.05 % ophthalmic emulsion Commonly known as: RESTASIS Place 1 drop into both eyes daily as needed (dry eyes).   EPINEPHrine 0.3 mg/0.3 mL Soaj injection Commonly known as: EpiPen 2-Pak Use as directed for severe allergic reactions What changed:   how much to take  how to take this  when to take this  reasons to take this  additional instructions   esomeprazole 40 MG capsule Commonly known as: NEXIUM TAKE 1 CAPSULE BY MOUTH EVERY DAY What changed:   how much to take  when to take this   Estradiol 10 MCG Tabs vaginal tablet Commonly known as: Yuvafem Use vaginally 2 times per wk   ferrous sulfate 325 (65 FE) MG tablet Take 650 mg by mouth daily with breakfast.   fluticasone 50 MCG/ACT nasal spray Commonly known as: Flonase TWO SPRAYS EACH NOSTRIL ONCE A DAY FOR NASAL CONGESTION OR DRAINAGE.   folic acid 1 MG tablet Commonly known as: FOLVITE Take 1 mg by mouth daily.   HYDROcodone-acetaminophen 7.5-325 mg/15 ml solution Commonly known as: HYCET Take 15 mLs by mouth 4 (four) times daily as needed for  moderate pain.   levocetirizine 5 MG tablet Commonly known as: XYZAL TAKE 1 TABLET BY MOUTH EVERY DAY IN THE EVENING What changed:   how much to take  how to take this   meloxicam 7.5 MG tablet Commonly known as: MOBIC TAKE 1 TAB DAILY AS NEEDED FOR ARTHRITIS. What changed: See the new instructions.   methotrexate 2.5 MG tablet Commonly known as: RHEUMATREX Take 15 mg by mouth once a week.   montelukast 10 MG tablet Commonly known as: SINGULAIR Take 1  tablet (10 mg total) by mouth at bedtime.   multivitamin with minerals Tabs tablet Take 1 tablet by mouth daily.   Simponi Aria 50 MG/4ML Soln injection Generic drug: golimumab Inject 50 mg into the vein every 8 (eight) weeks.   spironolactone 25 MG tablet Commonly known as: ALDACTONE TAKE 1 TABLET (25 MG TOTAL) BY MOUTH DAILY AS NEEDED.   traZODone 100 MG tablet Commonly known as: DESYREL Take 1.5 tabs nightly as needed. What changed:   how much to take  how to take this  when to take this  additional instructions   Vitamin D 50 MCG (2000 UT) Caps Take 4,000 Units by mouth daily.   zinc gluconate 50 MG tablet Take 50 mg by mouth daily.       Follow-up Information    Ralene Ok, MD. Schedule an appointment as soon as possible for a visit today.   Specialty: General Surgery Why: Post op visit Contact information: [place] [address] [phone]               Signed: Ralene Ok 05/05/2021, 4:18 PM

## 2021-05-05 NOTE — Progress Notes (Signed)
RD consulted for nutrition education regarding diet instructions s/p Nissen (pureed diet for 2 weeks post-op).    RD provided "IDDSI Level 4 Pureed [place]oka Cowden) Nutrition Therapy" handout from the Academy of [employer]etics. Reviewed patient's dietary recall. Provided examples on ways to modify foods on pureed diet. Encouraged pt to blend foods using a blender or food processor and strain to obtain desired consistency (no lumps). Also encouraged use of commercial pureed foods and oral nutritional supplements to provide adequate calories and protein. Pt can utilize "whole" or complete supplements such as Ensure/Boost or can add protein powder to smoothies/milkshakes, etc. Discussed how to modify recipes to add calories and protein.    RD discussed why it is important for patient to adhere to diet recommendations and discussed possibility of diet advancement upon MD follow-up. Teach back method used.   Expect good compliance.   Body mass index is 32.78 kg/m.  Diet advanced to CL today. Pt with questions regarding CL diet, all questions answered. Pt appears excited to eat something. Please re-consult RD as needed.   Kerman Passey MS, RDN, LDN, CNSC Registered Dietitian III Clinical Nutrition RD Pager and On-Call Pager Number Located in [place]

## 2021-05-08 ENCOUNTER — Other Ambulatory Visit: Payer: Self-pay

## 2021-05-08 ENCOUNTER — Other Ambulatory Visit: Payer: Self-pay | Admitting: Family Medicine

## 2021-05-08 ENCOUNTER — Emergency Department (HOSPITAL_BASED_OUTPATIENT_CLINIC_OR_DEPARTMENT_OTHER)
Admission: EM | Admit: 2021-05-08 | Discharge: 2021-05-08 | Disposition: A | Discharge status: Home / Self Care | Payer: 59 | Attending: Emergency Medicine | Admitting: Emergency Medicine

## 2021-05-08 ENCOUNTER — Encounter (HOSPITAL_BASED_OUTPATIENT_CLINIC_OR_DEPARTMENT_OTHER): Payer: Self-pay | Admitting: Emergency Medicine

## 2021-05-08 DIAGNOSIS — Z85028 Personal history of other malignant neoplasm of stomach: Secondary | ICD-10-CM | POA: Diagnosis not present

## 2021-05-08 DIAGNOSIS — Z96651 Presence of right artificial knee joint: Secondary | ICD-10-CM | POA: Diagnosis not present

## 2021-05-08 DIAGNOSIS — G8918 Other acute postprocedural pain: Secondary | ICD-10-CM | POA: Diagnosis not present

## 2021-05-08 DIAGNOSIS — J45909 Unspecified asthma, uncomplicated: Secondary | ICD-10-CM | POA: Insufficient documentation

## 2021-05-08 DIAGNOSIS — M1991 Primary osteoarthritis, unspecified site: Secondary | ICD-10-CM

## 2021-05-08 NOTE — ED Notes (Signed)
Pt also requests to have WBC checked d/t autoimmune status

## 2021-05-08 NOTE — ED Notes (Signed)
PT demonstrated hands on and verbal understanding of Incentive Spirometry.

## 2021-05-08 NOTE — ED Provider Notes (Signed)
[facility] Provider Note   CSN: [id number] Arrival date & time: 05/08/21  1446     History Chief Complaint  Patient presents with  . Post-op Problem    Melissa Middleton is a 65 y.o. female.  65 year old female who had a laparoscopic hiatal hernia repair 4 days ago.  The patient states that approximately 2 days ago she had a temperature as high as 101.5 but has not had any fever since then.  She has been running 98-99 since that time.  She states that she has psoriatic arthritis and gets monoclonal antibody monthly and also gets cetraxate weekly.  She is concerned that she might have developed an infection after the surgery since she is not on antibiotics.  She denies any cough, shortness of breath, worsening pain, change in bowel movements, change in mental status, nausea or vomiting.        Past Medical History:  Diagnosis Date  . Allergic rhinitis   . Anemia   . Anxiety   . Asthma   . Benign neoplasm of stomach    gastric polyps  . Diaphragmatic hernia without mention of obstruction or gangrene   . Diverticulosis of colon (without mention of hemorrhage)   . Esophageal reflux    see GI  . GERD (gastroesophageal reflux disease)   . Hiatal hernia   . Insomnia   . Osteoarthritis    sees ortho-has had steroid shot in knee  . Pneumonia   . PONV (postoperative nausea and vomiting)   . Post menopausal problems    symptoms  . Premenstrual tension syndromes    gyn uses alprazolam and spironolactone prn ofr treatment  . Primary hyperparathyroidism ([place])   . Psoriasis   . Psoriatic arthritis ([place])   . Vitamin D deficiency     Patient Active Problem List   Diagnosis Date Noted  . S/P Nissen fundoplication (without gastrostomy tube) procedure 05/04/2021  . Primary hyperparathyroidism ([place])   . Hiatal hernia   . Vaccine counseling 01/12/2020  . Abrasion of left ear canal 04/30/2019  . Cerumen debris on tympanic membrane of both ears  04/30/2019  . Degeneration of lumbar intervertebral disc 03/27/2018  . Displacement of lumbar intervertebral disc without myelopathy 03/27/2018  . Spinal stenosis in cervical region 03/27/2018  . Cervical spine pain 01/12/2018  . Lumbar pain 01/12/2018  . Lumbar radiculopathy 01/12/2018  . Anaphylactic shock due to adverse food reaction 05/08/2017  . Mild persistent asthma without complication 16/12/930  . Seasonal allergic rhinitis due to pollen 04/25/2017  . Psoriatic arthritis ([place]) 02/20/2017  . OAB (overactive bladder) 09/07/2016  . Vaginal atrophy 08/23/2016  . Menopause 08/23/2016  . Moderate persistent asthma 01/05/2016  . Idiopathic urticaria 01/05/2016  . Seasonal and perennial allergic rhinitis 01/05/2016  . Family history of ovarian cancer 12/08/2015  . H/O vitamin D deficiency 08/05/2015  . Fluid retention 08/05/2015  . Idiopathic scoliosis 08/05/2015  . Vitamin D deficiency 12/13/2014  . Sinusitis, acute, maxillary 09/08/2014  . Rash and nonspecific skin eruption 09/08/2014  . Bilirubinuria 09/08/2014  . Routine general medical examination at a health care facility 07/16/2013  . Benign paroxysmal positional vertigo 04/16/2013  . Elevated blood sugar 04/16/2013  . Numbness 04/16/2013  . Dyspnea 03/26/2012  . Shingles 03/07/2012  . Anxiety 01/11/2012  . Right knee pain 05/11/2011  . INSOMNIA, CHRONIC 07/23/2009  . GLUCOSE INTOLERANCE 10/20/2008  . DEPRESSION, MILD 10/16/2008  . ALLERGIC RHINITIS 09/19/2008  . PMS 09/19/2008  . OSTEOARTHRITIS 09/19/2008  .  INSOMNIA 09/19/2008  . Asthma 01/04/2008  . Gastroesophageal reflux disease without esophagitis 01/04/2008  . DIVERTICULOSIS, COLON 01/04/2008  . GASTRIC POLYP 12/06/2007  . ESOPHAGEAL STRICTURE 12/06/2007  . HIATAL HERNIA 12/06/2007    Past Surgical History:  Procedure Laterality Date  . CESAREAN SECTION    . ENDOMETRIAL ABLATION    . INSERTION OF MESH N/A 05/04/2021   Procedure: INSERTION OF MESH;   Surgeon: Ralene Ok, MD;  Location: [facility];  Service: General;  Laterality: N/A;  . NASAL SINUS SURGERY    . TOTAL KNEE ARTHROPLASTY Right 11/2009  . TUBAL LIGATION  2004   with ablation  . XI ROBOTIC ASSISTED HIATAL HERNIA REPAIR N/A 05/04/2021   Procedure: XI ROBOTIC ASSISTED HIATAL HERNIA REPAIR WITH MESH AND FUNDOPLICATION;  Surgeon: Ralene Ok, MD;  Location: [facility];  Service: General;  Laterality: N/A;     OB History    Gravida  2   Para  2   Term  2   Preterm      AB      Living  2     SAB      IAB      Ectopic      Multiple      Live Births  2           Family History  Problem Relation Age of Onset  . Hypertension Mother        died from an asthma attack  . Asthma Mother   . Diabetes Father        died from sepsis  . Hypertension Father   . Arthritis Father   . Ovarian cancer Sister   . Melanoma Sister   . Hypertension Maternal Grandfather   . Heart attack Maternal Grandfather   . Diabetes Paternal Grandmother   . Arthritis Paternal Grandmother   . Breast cancer Cousin   . Migraines Neg Hx   . Colon cancer Neg Hx   . Esophageal cancer Neg Hx   . Rectal cancer Neg Hx   . Stomach cancer Neg Hx     Social History   Tobacco Use  . Smoking status: Never Smoker  . Smokeless tobacco: Never Used  Vaping Use  . Vaping Use: Never used  Substance Use Topics  . Alcohol use: No    Alcohol/week: 0.0 standard drinks  . Drug use: No    Home Medications Prior to Admission medications   Medication Sig Start Date End Date Taking? Authorizing Provider  acetaminophen (TYLENOL) 500 MG tablet Take 500 mg by mouth every 6 (six) hours as needed.    [provider]  albuterol (VENTOLIN HFA) 108 (90 Base) MCG/ACT inhaler Inhale 2 puffs into the lungs every 4 (four) hours as needed for wheezing or shortness of breath. 01/10/20   Garnet Sierras, DO  ALPRAZolam Duanne Moron) 0.25 MG tablet TAKE 1 TABLET AT BEDTIME AS NEEDED FOR ANXIETY Patient taking  differently: Take 0.25 mg by mouth at bedtime as needed for anxiety. 03/17/21   Tamela Gammon, NP  Cholecalciferol (VITAMIN D) 50 MCG (2000 UT) CAPS Take 4,000 Units by mouth daily.    [provider]  cycloSPORINE (RESTASIS) 0.05 % ophthalmic emulsion Place 1 drop into both eyes daily as needed (dry eyes).    [provider]  EPINEPHrine (EPIPEN 2-PAK) 0.3 mg/0.3 mL IJ SOAJ injection Use as directed for severe allergic reactions Patient taking differently: Inject 0.3 mg into the muscle as needed for anaphylaxis. 11/12/18  Debbrah Alar, NP  esomeprazole (NEXIUM) 40 MG capsule TAKE 1 CAPSULE BY MOUTH EVERY DAY Patient taking differently: Take 40 mg by mouth at bedtime. 03/12/21   Shelda Pal, DO  Estradiol (YUVAFEM) 10 MCG TABS vaginal tablet Use vaginally 2 times per wk Patient not taking: No sig reported 11/26/20   Marny Lowenstein A, NP  ferrous sulfate 325 (65 FE) MG tablet Take 650 mg by mouth daily with breakfast.    [provider]  fluticasone (FLONASE) 50 MCG/ACT nasal spray TWO SPRAYS EACH NOSTRIL ONCE A DAY FOR NASAL CONGESTION OR DRAINAGE. Patient not taking: No sig reported 01/05/16   Charlies Silvers, MD  folic acid (FOLVITE) 1 MG tablet Take 1 mg by mouth daily. 04/07/21   [provider]  HYDROcodone-acetaminophen (HYCET) 7.5-325 mg/15 ml solution Take 15 mLs by mouth 4 (four) times daily as needed for moderate pain. 05/05/21 05/05/22  Ralene Ok, MD  levocetirizine (XYZAL) 5 MG tablet TAKE 1 TABLET BY MOUTH EVERY DAY IN THE EVENING Patient taking differently: Take 5 mg by mouth every evening. 04/12/21   Shelda Pal, DO  meloxicam (MOBIC) 7.5 MG tablet TAKE 1 TAB DAILY AS NEEDED FOR ARTHRITIS. Patient taking differently: Take 7.5 mg by mouth daily as needed for pain. 03/08/21   Shelda Pal, DO  methotrexate (RHEUMATREX) 2.5 MG tablet Take 15 mg by mouth once a week. 12/31/19   [provider]   montelukast (SINGULAIR) 10 MG tablet Take 1 tablet (10 mg total) by mouth at bedtime. 10/22/20   Shelda Pal, DO  Multiple Vitamin (MULTIVITAMIN WITH MINERALS) TABS tablet Take 1 tablet by mouth daily.    [provider]  SIMPONI ARIA 50 MG/4ML SOLN injection Inject 50 mg into the vein every 8 (eight) weeks. 10/29/19   [provider]  spironolactone (ALDACTONE) 25 MG tablet TAKE 1 TABLET (25 MG TOTAL) BY MOUTH DAILY AS NEEDED. 10/17/19   Huel Cote, NP  traZODone (DESYREL) 100 MG tablet Take 1.5 tabs nightly as needed. Patient taking differently: Take 100-150 mg by mouth at bedtime. 02/10/21   Shelda Pal, DO  zinc gluconate 50 MG tablet Take 50 mg by mouth daily.    [provider]    Allergies    Macrodantin [nitrofurantoin macrocrystal], Cefuroxime axetil, Flavoring agent, Penicillins, and Shellfish allergy  Review of Systems   Review of Systems  All other systems reviewed and are negative.   Physical Exam Updated Vital Signs BP 128/78 (BP Location: Right Arm)   Pulse 88   Temp 98.8 F (37.1 C) (Oral)   Resp 16   Ht 5\' 5"  (1.651 m)   Wt 81.6 kg   SpO2 100%   BMI 29.95 kg/m   Physical Exam Vitals and nursing note reviewed.  Constitutional:      Appearance: She is well-developed.  HENT:     Head: Normocephalic and atraumatic.     Mouth/Throat:     Mouth: Mucous membranes are moist.     Pharynx: Oropharynx is clear.  Eyes:     Pupils: Pupils are equal, round, and reactive to light.  Cardiovascular:     Rate and Rhythm: Normal rate and regular rhythm.  Pulmonary:     Effort: No respiratory distress.     Breath sounds: No stridor.  Abdominal:     General: Abdomen is flat. There is no distension.  Musculoskeletal:     Cervical back: Normal range of motion.  Skin:  General: Skin is warm and dry.     Comments: 6 small incisions to abdomen and suprapubic area are c/d/i without evidence of infection.   Neurological:     General: No focal deficit present.     Mental Status: She is alert.     ED Results / Procedures / Treatments   Labs (all labs ordered are listed, but only abnormal results are displayed) Labs Reviewed - No data to display  EKG None  Radiology No results found.  Procedures Procedures   Medications Ordered in ED Medications - No data to display  ED Course  I have reviewed the triage vital signs and the nursing notes.  Pertinent labs & imaging results that were available during my care of the patient were reviewed by me and considered in my medical decision making (see chart for details).    MDM Rules/Calculators/A&P                          I suspect is a normal postoperative fever.  She has no signs of pneumonia on history or exam.  Her wounds do not appear to be infected.  She has no pain to palpation to suggest peritonitis or other intra-abdominal infection.  She has been little bit sleepy lately but no other concerning factors from her history to require work-up or x-ray at this time.  Patient continue to follow-up with Dr. Rosendo Gros as scheduled.  Return here for any new or worsening symptoms.  Incentive spirometer furnished upon request.  Final Clinical Impression(s) / ED Diagnoses Final diagnoses:  Post-operative pain    Rx / DC Orders ED Discharge Orders    None       Krystl Wickware, Corene Cornea, MD 05/08/21 [phone]

## 2021-05-08 NOTE — ED Triage Notes (Signed)
Pt arrives pov with driver, endorses surgery x 4 days pta for hiatal hernia repair. Pt reports concern that breathing is not WNL. Pt o2 sats 100% in triage. Able to speak in full sentences.Pt also reports concern for not getting abx after surgery, d/t autoimmune meds

## 2021-05-11 ENCOUNTER — Other Ambulatory Visit: Payer: Self-pay | Admitting: Nurse Practitioner

## 2021-05-13 NOTE — Telephone Encounter (Signed)
Medication refill request: alprazolam  Last AEX:  11-26-20 TW  Next AEX: 11-30-21 Last MMG (if hormonal medication request): n/a Refill authorized: Today, please advise.   Medication pended for #30, 0RF. Please refill if appropriate.

## 2021-06-08 ENCOUNTER — Ambulatory Visit (INDEPENDENT_AMBULATORY_CARE_PROVIDER_SITE_OTHER): Payer: 59 | Admitting: Internal Medicine

## 2021-06-08 ENCOUNTER — Other Ambulatory Visit: Payer: Self-pay

## 2021-06-08 VITALS — BP 112/59 | HR 74 | Temp 98.1°F | Resp 12 | Ht 65.0 in | Wt 172.4 lb

## 2021-06-08 DIAGNOSIS — E213 Hyperparathyroidism, unspecified: Secondary | ICD-10-CM | POA: Diagnosis not present

## 2021-06-08 NOTE — Progress Notes (Signed)
Name: Melissa Middleton  MRN/ DOB: [id number], 03/09/1956    Age/ Sex: 65 y.o., female    PCP: Shelda Pal, DO   Reason for Endocrinology Evaluation: Hyperparathyroidism     Date of Initial Endocrinology Evaluation: 06/08/2021     HPI: [name] is a 65 y.o. female with a past medical history of Vitamin D deficiency, psoriatic arthritis and GERD. The patient presented for initial endocrinology clinic visit on 06/08/2021 for consultative assistance with her Hyperparathyroidsism.     Of note,  she is S/P hiatal hernia sx as well as nissen fundoplication 04/06/3789   She has been noted with elevated PTH at 67 pg/mL ( reference 14-64) during routine labs on 02/26/2021, her calcium at the time was normal at 9.6 mg/dL ( uncorrected)    In review of her records , she has been noted with a serum calcium in 01/2021 at 10.8 mg/dL ( uncorrected ) and 10.72 mg/dL (corrected) , her vitamin D at the time was normal at 46.81   Two days after her surgery she was noted with serum serum calcium at 8.6 mg/dL    She has NO hx of Osteoporosis, and no bone fractures  Denies renal stones    Has neck and back discomfort  Denies FH of osteoporosis, or renal stones   No prior use of Lithium or HCTZ  Uses spironolactone on PRN basis and xanax for ? Fatigue / PMS  through GYN  Medications Vitamin D 2000 iu , 2 caps daily  MVI daily    No calcium tabs  She is on Simpni and MTX for psoriatic arthritis     HISTORY:  Past Medical History:  Past Medical History:  Diagnosis Date   Allergic rhinitis    Anemia    Anxiety    Asthma    Benign neoplasm of stomach    gastric polyps   Diaphragmatic hernia without mention of obstruction or gangrene    Diverticulosis of colon (without mention of hemorrhage)    Esophageal reflux    see GI   GERD (gastroesophageal reflux disease)    Hiatal hernia    Insomnia    Osteoarthritis    sees ortho-has had steroid shot in knee    Pneumonia    PONV (postoperative nausea and vomiting)    Post menopausal problems    symptoms   Premenstrual tension syndromes    gyn uses alprazolam and spironolactone prn ofr treatment   Primary hyperparathyroidism ([place])    Psoriasis    Psoriatic arthritis ([place])    Vitamin D deficiency    Past Surgical History:  Past Surgical History:  Procedure Laterality Date   CESAREAN SECTION     ENDOMETRIAL ABLATION     INSERTION OF MESH N/A 05/04/2021   Procedure: INSERTION OF MESH;  Surgeon: Ralene Ok, MD;  Location: [facility];  Service: General;  Laterality: N/A;   NASAL SINUS SURGERY     TOTAL KNEE ARTHROPLASTY Right 11/2009   TUBAL LIGATION  2004   with ablation   XI ROBOTIC ASSISTED HIATAL HERNIA REPAIR N/A 05/04/2021   Procedure: XI ROBOTIC ASSISTED HIATAL HERNIA REPAIR WITH MESH AND FUNDOPLICATION;  Surgeon: Ralene Ok, MD;  Location: [facility];  Service: General;  Laterality: N/A;    Social History:  reports that she has never smoked. She has never used smokeless tobacco. She reports that she does not drink alcohol and does not use drugs. Family History: family history includes Arthritis in her father  and paternal grandmother; Asthma in her mother; Breast cancer in her cousin; Diabetes in her father and paternal grandmother; Heart attack in her maternal grandfather; Hypertension in her father, maternal grandfather, and mother; Melanoma in her sister; Ovarian cancer in her sister.   HOME MEDICATIONS: Allergies as of 06/08/2021       Reactions   Macrodantin [nitrofurantoin Macrocrystal] Itching, Rash   Cefuroxime Axetil Rash, Other (See Comments)   REACTION: red face/rash   Flavoring Agent Itching, Rash   Penicillins Rash   Tolerated Ancef intra op 05/04/2021   Shellfish Allergy Itching, Rash   Redness        Medication List        Accurate as of June 08, 2021  7:59 AM. If you have any questions, ask your nurse or doctor.          acetaminophen 500 MG  tablet Commonly known as: TYLENOL Take 500 mg by mouth every 6 (six) hours as needed.   albuterol 108 (90 Base) MCG/ACT inhaler Commonly known as: VENTOLIN HFA Inhale 2 puffs into the lungs every 4 (four) hours as needed for wheezing or shortness of breath.   ALPRAZolam 0.25 MG tablet Commonly known as: XANAX TAKE 1 TABLET AT BEDTIME AS NEEDED FOR ANXIETY   cycloSPORINE 0.05 % ophthalmic emulsion Commonly known as: RESTASIS Place 1 drop into both eyes daily as needed (dry eyes).   EPINEPHrine 0.3 mg/0.3 mL Soaj injection Commonly known as: EpiPen 2-Pak Use as directed for severe allergic reactions What changed:  how much to take how to take this when to take this reasons to take this additional instructions   esomeprazole 40 MG capsule Commonly known as: NEXIUM TAKE 1 CAPSULE BY MOUTH EVERY DAY What changed:  how much to take when to take this   Estradiol 10 MCG Tabs vaginal tablet Commonly known as: Yuvafem Use vaginally 2 times per wk   ferrous sulfate 325 (65 FE) MG tablet Take 650 mg by mouth daily with breakfast.   fluticasone 50 MCG/ACT nasal spray Commonly known as: Flonase TWO SPRAYS EACH NOSTRIL ONCE A DAY FOR NASAL CONGESTION OR DRAINAGE.   folic acid 1 MG tablet Commonly known as: FOLVITE Take 1 mg by mouth daily.   HYDROcodone-acetaminophen 7.5-325 mg/15 ml solution Commonly known as: HYCET Take 15 mLs by mouth 4 (four) times daily as needed for moderate pain.   levocetirizine 5 MG tablet Commonly known as: XYZAL TAKE 1 TABLET BY MOUTH EVERY DAY IN THE EVENING What changed:  how much to take how to take this   meloxicam 7.5 MG tablet Commonly known as: MOBIC TAKE 1 TABLET BY MOUTH EVERY DAY AS NEEDED FOR ARTHRITIS   methotrexate 2.5 MG tablet Commonly known as: RHEUMATREX Take 15 mg by mouth once a week.   montelukast 10 MG tablet Commonly known as: SINGULAIR Take 1 tablet (10 mg total) by mouth at bedtime.   multivitamin with  minerals Tabs tablet Take 1 tablet by mouth daily.   Simponi Aria 50 MG/4ML Soln injection Generic drug: golimumab Inject 50 mg into the vein every 8 (eight) weeks.   spironolactone 25 MG tablet Commonly known as: ALDACTONE TAKE 1 TABLET (25 MG TOTAL) BY MOUTH DAILY AS NEEDED.   traZODone 100 MG tablet Commonly known as: DESYREL Take 1.5 tabs nightly as needed. What changed:  how much to take how to take this when to take this additional instructions   Vitamin D 50 MCG (2000 UT) Caps Take 4,000 Units by  mouth daily.   zinc gluconate 50 MG tablet Take 50 mg by mouth daily.          REVIEW OF SYSTEMS: A comprehensive ROS was conducted with the patient and is negative except as per HPI    OBJECTIVE:  VS: BP (!) 112/59 (BP Location: Left Arm, Cuff Size: Normal)   Pulse 74   Temp 98.1 F (36.7 C) (Oral)   Resp 12   Ht 5\' 5"  (1.651 m)   Wt 172 lb 6.4 oz (78.2 kg)   SpO2 99%   BMI 28.69 kg/m    Wt Readings from Last 3 Encounters:  05/08/21 180 lb (81.6 kg)  05/04/21 180 lb (81.6 kg)  04/28/21 179 lb 8 oz (81.4 kg)     EXAM: General: Pt appears well and is in NAD  Neck: General: Supple without adenopathy. Thyroid: Thyroid size normal.  No goiter or nodules appreciated. No thyroid bruit.  Lungs: Clear with good BS bilat with no rales, rhonchi, or wheezes  Heart: Auscultation: RRR.  Abdomen: Normoactive bowel sounds, soft, nontender, without masses or organomegaly palpable  Extremities:  BL LE: No pretibial edema normal ROM and strength.  Skin: Hair: Texture and amount normal with gender appropriate distribution Skin Inspection: No rashes Skin Palpation: Skin temperature, texture, and thickness normal to palpation  Neuro: Cranial nerves: II - XII grossly intact  Motor: Normal strength throughout DTRs: 2+ and symmetric in UE without delay in relaxation phase  Mental Status: Judgment, insight: Intact Orientation: Oriented to time, place, and person Mood and  affect: No depression, anxiety, or agitation     DATA REVIEWED: Results for LANDRI, DORSAINVIL (MRN [id number]) as of 06/09/2021 16:10  Ref. Range 06/08/2021 14:56  Sodium Latest Ref Range: 135 - 145 mEq/L 137  Potassium Latest Ref Range: 3.5 - 5.1 mEq/L 4.2  Chloride Latest Ref Range: 96 - 112 mEq/L 102  CO2 Latest Ref Range: 19 - 32 mEq/L 28  Glucose Latest Ref Range: 70 - 99 mg/dL 90  BUN Latest Ref Range: 6 - 23 mg/dL 11  Creatinine Latest Ref Range: 0.40 - 1.20 mg/dL 0.74  Calcium Latest Ref Range: 8.4 - 10.5 mg/dL 9.4  Calcium Ionized Latest Ref Range: 4.8 - 5.6 mg/dL 5.29  Albumin Latest Ref Range: 3.5 - 5.2 g/dL 3.9  GFR Latest Ref Range: >60.00 mL/min 85.01  VITD Latest Ref Range: 30.00 - 100.00 ng/mL 39.04  PTH, Intact Latest Ref Range: 16 - 77 pg/mL 40  TSH Latest Ref Range: 0.35 - 4.50 uIU/mL 0.54  T4,Free(Direct) Latest Ref Range: 0.60 - 1.60 ng/dL 0.82      ASSESSMENT/PLAN/RECOMMENDATIONS:   Hyperparathyroidism:  -In review of her BMP from 2009 she had 1 reading of hypercalcemia which was in February 2022, she also has been noted to have 1 reading of hypocalcemia in May 2022.  Repeat testing today shows normal serum calcium as well as normal ionized calcium and PTH  -No intervention at this point but I would recommend monitoring -She may continue with vitamin D as well as multivitamin   Follow-up in 3 months  Signed electronically by: Mack Guise, MD  [facility]  [address] [place], [place] [address] Phone: [phone] FAX: [phone]   CC: Shelda Pal, [place]  [address] [place] [address] Phone: [phone] Fax: [phone]   Return to Endocrinology clinic as below: Future Appointments  Date Time Provider [place]  06/08/2021  2:20  PM Julien Oscar, Melanie Crazier, MD [facility]  08/10/2021  1:00 PM Shelda Pal, DO [facility]   11/30/2021  2:00 PM Tamela Gammon, NP [facility] None

## 2021-06-08 NOTE — Patient Instructions (Signed)

## 2021-06-09 ENCOUNTER — Encounter: Payer: Self-pay | Admitting: Internal Medicine

## 2021-06-09 LAB — BASIC METABOLIC PANEL
BUN: 11 mg/dL (ref 6–23)
CO2: 28 mEq/L (ref 19–32)
Calcium: 9.4 mg/dL (ref 8.4–10.5)
Chloride: 102 mEq/L (ref 96–112)
Creatinine, Ser: 0.74 mg/dL (ref 0.40–1.20)
GFR: 85.01 mL/min (ref >60.00)
Glucose, Bld: 90 mg/dL (ref 70–99)
Potassium: 4.2 mEq/L (ref 3.5–5.1)
Sodium: 137 mEq/L (ref 135–145)

## 2021-06-09 LAB — T4, FREE: Free T4: 0.82 ng/dL (ref 0.60–1.60)

## 2021-06-09 LAB — VITAMIN D 25 HYDROXY (VIT D DEFICIENCY, FRACTURES): VITD: 39.04 ng/mL (ref 30.00–100.00)

## 2021-06-09 LAB — PARATHYROID HORMONE, INTACT (NO CA): PTH: 40 pg/mL (ref 16–77)

## 2021-06-09 LAB — ALBUMIN: Albumin: 3.9 g/dL (ref 3.5–5.2)

## 2021-06-09 LAB — TSH: TSH: 0.54 u[IU]/mL (ref 0.35–4.50)

## 2021-06-09 LAB — CALCIUM, IONIZED: Calcium, Ion: 5.29 mg/dL (ref 4.8–5.6)

## 2021-07-06 ENCOUNTER — Other Ambulatory Visit: Payer: Self-pay | Admitting: Nurse Practitioner

## 2021-07-09 ENCOUNTER — Other Ambulatory Visit: Payer: Self-pay | Admitting: Family Medicine

## 2021-07-09 DIAGNOSIS — M1991 Primary osteoarthritis, unspecified site: Secondary | ICD-10-CM

## 2021-07-15 ENCOUNTER — Other Ambulatory Visit: Payer: Self-pay | Admitting: Family Medicine

## 2021-08-10 ENCOUNTER — Ambulatory Visit (INDEPENDENT_AMBULATORY_CARE_PROVIDER_SITE_OTHER): Payer: 59 | Admitting: Family Medicine

## 2021-08-10 ENCOUNTER — Encounter: Payer: Self-pay | Admitting: Family Medicine

## 2021-08-10 ENCOUNTER — Other Ambulatory Visit: Payer: Self-pay

## 2021-08-10 VITALS — BP 108/70 | HR 65 | Temp 97.8°F | Ht 65.0 in | Wt 173.4 lb

## 2021-08-10 DIAGNOSIS — J4530 Mild persistent asthma, uncomplicated: Secondary | ICD-10-CM | POA: Diagnosis not present

## 2021-08-10 DIAGNOSIS — G4700 Insomnia, unspecified: Secondary | ICD-10-CM

## 2021-08-10 NOTE — Patient Instructions (Signed)
Sleep is important to [place] all. Getting good sleep is imperative to adequate functioning during the day. Work with our counselors who are trained to help people obtain quality sleep. Call [phone] to schedule an appointment or if you are curious about insurance coverage/cost.  Aim to do some physical exertion for 150 minutes per week. This is typically divided into 5 days per week, 30 minutes per day. The activity should be enough to get your heart rate up. Anything is better than nothing if you have time constraints.  Let me know if you need refills.  Please continue melatonin.  Let us know if you need anything.

## 2021-08-10 NOTE — Progress Notes (Signed)
Chief Complaint  Patient presents with   Follow-up    6 month    [name] is a 65 y.o. female here for asthma.  Currently treated with Singulair 10 mg/d (takes intermittently) and SABA prn. Compliance is excellent. Uses rescue inhaler 0 times per week. Reports breathing is good overall. No nighttime awakenings.  Insomnia Taking trazodone 100-150 mg qhs prn. No AE's. Working well sometimes. Not exercising at this time. Tried melatonin that did help. Not following with a therapist.   Past Medical History:  Diagnosis Date   Allergic rhinitis    Anemia    Anxiety    Asthma    Benign neoplasm of stomach    gastric polyps   Diaphragmatic hernia without mention of obstruction or gangrene    Diverticulosis of colon (without mention of hemorrhage)    Esophageal reflux    see GI   GERD (gastroesophageal reflux disease)    Hiatal hernia    Insomnia    Osteoarthritis    sees ortho-has had steroid shot in knee   Pneumonia    PONV (postoperative nausea and vomiting)    Post menopausal problems    symptoms   Premenstrual tension syndromes    gyn uses alprazolam and spironolactone prn ofr treatment   Primary hyperparathyroidism ([facility])    Psoriasis    Psoriatic arthritis ([facility])    Vitamin D deficiency     BP 108/70   Pulse 65   Temp 97.8 F (36.6 C) (Oral)   Ht '5\' 5"'$  (1.651 m)   Wt 173 lb 6 oz (78.6 kg)   SpO2 97%   BMI 28.85 kg/m  Gen: Awake, alert HEENT: MMM, nares patent, no polyps Heart: RRR, no LE edema Lungs: CTAB, no accessory muscle use Msk: No clubbing or cyanosis Psych: Age appropriate judgment and insight  Mild persistent asthma without complication  Insomnia, unspecified type  Chronic, stable for now. Cont Singulair during rough seasons 10 mg/d. Cont SABA prn.  Chronic, stable. Cont Trazodone 100-150 mg qhs prn. [facility] info given. Exercise rec'd. Melatonin seems to be working well.  F/u in 6 mo or prn for CPE. The patient voiced understanding and  agreement to the plan.  [place], DO 08/10/21 1:38 PM

## 2021-08-30 ENCOUNTER — Other Ambulatory Visit: Payer: Self-pay | Admitting: Nurse Practitioner

## 2021-08-30 NOTE — Telephone Encounter (Signed)
RX refill for Xanax Last annual 11/26/2020 Upcoming Annual 11/29/2021 I will route this to Provider for approval

## 2021-09-14 ENCOUNTER — Ambulatory Visit: Payer: 59 | Admitting: Internal Medicine

## 2021-10-15 LAB — HM MAMMOGRAPHY

## 2021-10-20 ENCOUNTER — Encounter: Payer: Self-pay | Admitting: Nurse Practitioner

## 2021-10-22 ENCOUNTER — Other Ambulatory Visit: Payer: Self-pay | Admitting: Nurse Practitioner

## 2021-10-22 NOTE — Telephone Encounter (Signed)
Annual exam scheduled on 11/30/21

## 2021-10-25 ENCOUNTER — Encounter: Payer: Self-pay | Admitting: Family Medicine

## 2021-11-28 ENCOUNTER — Other Ambulatory Visit: Payer: Self-pay | Admitting: Family Medicine

## 2021-11-30 ENCOUNTER — Ambulatory Visit (INDEPENDENT_AMBULATORY_CARE_PROVIDER_SITE_OTHER): Payer: 59 | Admitting: Nurse Practitioner

## 2021-11-30 ENCOUNTER — Encounter: Payer: Self-pay | Admitting: Nurse Practitioner

## 2021-11-30 ENCOUNTER — Other Ambulatory Visit (HOSPITAL_COMMUNITY)
Admission: RE | Admit: 2021-11-30 | Discharge: 2021-11-30 | Disposition: A | Discharge status: Home / Self Care | Payer: 59 | Source: Ambulatory Visit | Attending: Nurse Practitioner | Admitting: Nurse Practitioner

## 2021-11-30 ENCOUNTER — Other Ambulatory Visit: Payer: Self-pay

## 2021-11-30 VITALS — BP 126/84 | Ht 64.0 in | Wt 179.0 lb

## 2021-11-30 DIAGNOSIS — R8290 Unspecified abnormal findings in urine: Secondary | ICD-10-CM

## 2021-11-30 DIAGNOSIS — Z01419 Encounter for gynecological examination (general) (routine) without abnormal findings: Secondary | ICD-10-CM | POA: Insufficient documentation

## 2021-11-30 DIAGNOSIS — Z1151 Encounter for screening for human papillomavirus (HPV): Secondary | ICD-10-CM | POA: Diagnosis not present

## 2021-11-30 DIAGNOSIS — Z780 Asymptomatic menopausal state: Secondary | ICD-10-CM

## 2021-11-30 DIAGNOSIS — N951 Menopausal and female climacteric states: Secondary | ICD-10-CM | POA: Diagnosis not present

## 2021-11-30 DIAGNOSIS — R140 Abdominal distension (gaseous): Secondary | ICD-10-CM

## 2021-11-30 DIAGNOSIS — B369 Superficial mycosis, unspecified: Secondary | ICD-10-CM

## 2021-11-30 DIAGNOSIS — Z8041 Family history of malignant neoplasm of ovary: Secondary | ICD-10-CM | POA: Diagnosis not present

## 2021-11-30 MED ORDER — NYSTATIN 100000 UNIT/GM EX POWD: 1.0000 "application " | Freq: Three times a day (TID) | CUTANEOUS | 1 refills | Status: AC

## 2021-11-30 NOTE — Progress Notes (Signed)
MONTEZ STRYKER February 05, 1956 [id number]   History:  65 y.o. G2P2002 presents for annual exam. Postmenopausal. Using Yuvafem tablet for vaginal dryness but has not been using consistently. Normal pap and mammogram history.  Hyperparathyroidism managed by endocrinology, psoriatic arthritis managed by rheumatology. Complains of abdominal bloating, sister with history of ovarian cancer so she is concerned. Denies any GI symptoms or pain. She also complains of malodorous urine.   Gynecologic History No LMP recorded. Patient postmenopausal.   Contraception/Family planning: post menopausal status  Health Maintenance Last Pap: 11/26/2020. Results were: Insufficient cells Last mammogram: 10/20/2021. Results were: Normal Last colonoscopy: 01/13/2021. Results were: Normal, 10-year recall Last Dexa: 06/04/2020. Results were: Normal  Past medical history, past surgical history, family history and social history were all reviewed and documented in the EPIC chart. Married. 1 daughter. 69 yo son in nursing school. Sister with history of ovarian cancer.   ROS:  A ROS was performed and pertinent positives and negatives are included.  Exam:  Vitals:   11/30/21 1508  BP: 126/84  Weight: 179 lb (81.2 kg)  Height: 5\' 4"  (1.626 m)    Body mass index is 30.73 kg/m.  General appearance:  Normal Thyroid:  Symmetrical, normal in size, without palpable masses or nodularity. Respiratory  Auscultation:  Clear without wheezing or rhonchi Cardiovascular  Auscultation:  Regular rate, without rubs, murmurs or gallops  Edema/varicosities:  Not grossly evident Abdominal  Soft,nontender, without masses, guarding or rebound.  Liver/spleen:  No organomegaly noted  Hernia:  None appreciated  Skin  Inspection:  Redness along lower abdominal fold c/w fungal infection   Breasts: Examined lying and sitting.   Right: Without masses, retractions, discharge or axillary adenopathy.   Left: Without masses, retractions,  discharge or axillary adenopathy. Genitourinary   Inguinal/mons:  Normal without inguinal adenopathy  External genitalia:  Normal appearing vulva with no masses, tenderness, or lesions. Atrophic changes  BUS/Urethra/Skene's glands:  Normal  Vagina:  Normal appearing with normal color and discharge, no lesions. Atrophic changes  Cervix:  Normal appearing without discharge or lesions  Uterus:  Normal in size, shape and contour.  Midline and mobile, nontender  Adnexa/parametria:     Rt: Normal in size, without masses or tenderness.   Lt: Normal in size, without masses or tenderness.  Anus and perineum: Normal  Digital rectal exam: Normal sphincter tone without palpated masses or tenderness  Patient informed chaperone available to be present for breast and pelvic exam. Patient has requested no chaperone to be present. Patient has been advised what will be completed during breast and pelvic exam.   Assessment/Plan:  65 y.o. [id number] for breast and pelvic exam.   Well female exam with routine gynecological exam -Education provided on SBEs, importance of preventative screenings, current guidelines, high calcium diet, regular exercise, and multivitamin daily. Labs with PCP and rheumatology.   Vaginal dryness, menopausal - Using Yuvafem but not consistently. She experiences vaginal dryness and irritation. We again discussed effectiveness with consistent use and she plans to use twice weekly as recommended.   Abnormal urine odor - Plan: Urinalysis,Complete w/RFL Culture. UA unremarkable. Culture pending.   Abdominal bloating - Plan: US PELVIS TRANSVAGINAL NON-OB (TV ONLY). No GI symptoms. Sister with history of ovarian cancer.   Family history of ovarian cancer - Plan: US PELVIS TRANSVAGINAL NON-OB (TV ONLY). Sister.   Fungal infection of skin - Plan: nystatin (MYCOSTATIN/NYSTOP) powder TID x 10-14 days. Keep area clean and dry to prevent further infections. She has been using Gold  bond with no  improvement.   Screening for cervical cancer - Normal Pap history.  Pap collected today. Will repeat at 3-year intervals per guidelines due to use of immunosuppressants.   Screening for breast cancer - Normal mammogram history.  Continue annual screenings.  Normal breast exam today.  Screening for colon cancer - Colonoscopy 12/2020. Will repeat at GI's recommended interval.   Screening for osteoporosis - Normal bone density 05/2020. Will repeat at 5-year interval per recommendation.   Follow-up in 1 year for annual.   Tamela Gammon [facility], 3:54 PM 11/30/2021

## 2021-12-02 ENCOUNTER — Encounter: Payer: Self-pay | Admitting: Nurse Practitioner

## 2021-12-02 LAB — CYTOLOGY - PAP
Comment: NEGATIVE
Diagnosis: NEGATIVE
High risk HPV: NEGATIVE

## 2021-12-02 LAB — URINALYSIS, COMPLETE W/RFL CULTURE
Bilirubin Urine: NEGATIVE
Casts: NONE SEEN /LPF
Crystals: NONE SEEN /HPF
Glucose, UA: NEGATIVE
Hgb urine dipstick: NEGATIVE
Hyaline Cast: NONE SEEN /LPF
Leukocyte Esterase: NEGATIVE
Nitrites, Initial: NEGATIVE
Protein, ur: NEGATIVE
RBC / HPF: NONE SEEN /HPF (ref 0–2)
Specific Gravity, Urine: 1.029 (ref 1.001–1.035)
Yeast: NONE SEEN /HPF
pH: 5.5 (ref 5.0–8.0)

## 2021-12-02 LAB — URINE CULTURE
MICRO NUMBER:: 12750502
Result:: NO GROWTH
SPECIMEN QUALITY:: ADEQUATE

## 2021-12-02 LAB — CULTURE INDICATED

## 2021-12-28 ENCOUNTER — Other Ambulatory Visit: Payer: 59 | Admitting: Obstetrics and Gynecology

## 2021-12-28 ENCOUNTER — Other Ambulatory Visit: Payer: 59

## 2021-12-29 ENCOUNTER — Other Ambulatory Visit: Payer: Self-pay | Admitting: Nurse Practitioner

## 2021-12-29 ENCOUNTER — Other Ambulatory Visit: Payer: Self-pay | Admitting: Family Medicine

## 2021-12-29 NOTE — Telephone Encounter (Signed)
Last annual exam scheduled 11/30/21

## 2022-01-06 ENCOUNTER — Other Ambulatory Visit: Payer: Self-pay

## 2022-01-06 ENCOUNTER — Encounter: Payer: Self-pay | Admitting: Obstetrics and Gynecology

## 2022-01-06 ENCOUNTER — Ambulatory Visit (INDEPENDENT_AMBULATORY_CARE_PROVIDER_SITE_OTHER): Payer: 59 | Admitting: Obstetrics and Gynecology

## 2022-01-06 ENCOUNTER — Ambulatory Visit (INDEPENDENT_AMBULATORY_CARE_PROVIDER_SITE_OTHER): Payer: 59

## 2022-01-06 VITALS — BP 120/74 | HR 80 | Ht 64.0 in | Wt 179.0 lb

## 2022-01-06 DIAGNOSIS — R140 Abdominal distension (gaseous): Secondary | ICD-10-CM

## 2022-01-06 DIAGNOSIS — Z8041 Family history of malignant neoplasm of ovary: Secondary | ICD-10-CM

## 2022-01-06 DIAGNOSIS — N3281 Overactive bladder: Secondary | ICD-10-CM

## 2022-01-06 NOTE — Progress Notes (Signed)
GYNECOLOGY  VISIT   HPI: 66 y.o.   Married  Caucasian  female   G2P2002 with No LMP recorded. Patient has had an ablation.   here for pelvic ultrasound.     Patient has abdominal bloating.  Had hiatal hernia repair.   No vaginal bleeding.   Sister had ovarian cancer.  No FH of breast or colon cancer.   Patient does periodic ultrasound to check her ovaries.  Some urinary urgency.  Used Myrbetriq in the past but did not use it often.  Drinks sweat tea.  GYNECOLOGIC HISTORY: No LMP recorded. Patient has had an ablation. Contraception:  PMP Menopausal hormone therapy:  Yuvafem Last mammogram:  10-20-21 Neg/BiRads2 Last pap smear:   11-30-21 Neg:Neg HR HPV, 11-26-20 Unsatisfactory specimen, 10-30-18 Neg:Neg HR HPV        OB History     Gravida  2   Para  2   Term  2   Preterm      AB      Living  2      SAB      IAB      Ectopic      Multiple      Live Births  2              Patient Active Problem List   Diagnosis Date Noted   S/P Nissen fundoplication (without gastrostomy tube) procedure 05/04/2021   Primary hyperparathyroidism ([place])    Hiatal hernia    Vaccine counseling 01/12/2020   Abrasion of left ear canal 04/30/2019   Cerumen debris on tympanic membrane of both ears 04/30/2019   Degeneration of lumbar intervertebral disc 03/27/2018   Displacement of lumbar intervertebral disc without myelopathy 03/27/2018   Spinal stenosis in cervical region 03/27/2018   Cervical spine pain 01/12/2018   Lumbar pain 01/12/2018   Lumbar radiculopathy 01/12/2018   Anaphylactic shock due to adverse food reaction 05/08/2017   Mild persistent asthma without complication 17/49/4496   Seasonal allergic rhinitis due to pollen 04/25/2017   Psoriatic arthritis ([place]) 02/20/2017   OAB (overactive bladder) 09/07/2016   Vaginal atrophy 08/23/2016   Menopause 08/23/2016   Moderate persistent asthma 01/05/2016   Idiopathic urticaria 01/05/2016   Seasonal and perennial  allergic rhinitis 01/05/2016   Family history of ovarian cancer 12/08/2015   H/O vitamin D deficiency 08/05/2015   Fluid retention 08/05/2015   Idiopathic scoliosis 08/05/2015   Vitamin D deficiency 12/13/2014   Sinusitis, acute, maxillary 09/08/2014   Rash and nonspecific skin eruption 09/08/2014   Bilirubinuria 09/08/2014   Routine general medical examination at a health care facility 07/16/2013   Benign paroxysmal positional vertigo 04/16/2013   Elevated blood sugar 04/16/2013   Numbness 04/16/2013   Dyspnea 03/26/2012   Shingles 03/07/2012   Anxiety 01/11/2012   Right knee pain 05/11/2011   INSOMNIA, CHRONIC 07/23/2009   GLUCOSE INTOLERANCE 10/20/2008   DEPRESSION, MILD 10/16/2008   ALLERGIC RHINITIS 09/19/2008   PMS 09/19/2008   OSTEOARTHRITIS 09/19/2008   INSOMNIA 09/19/2008   Asthma 01/04/2008   Gastroesophageal reflux disease without esophagitis 01/04/2008   DIVERTICULOSIS, COLON 01/04/2008   GASTRIC POLYP 12/06/2007   ESOPHAGEAL STRICTURE 12/06/2007   HIATAL HERNIA 12/06/2007    Past Medical History:  Diagnosis Date   Allergic rhinitis    Anemia    Anxiety    Asthma    Benign neoplasm of stomach    gastric polyps   Diaphragmatic hernia without mention of obstruction or gangrene    Diverticulosis of  colon (without mention of hemorrhage)    Esophageal reflux    see GI   GERD (gastroesophageal reflux disease)    Hiatal hernia    Insomnia    Osteoarthritis    sees ortho-has had steroid shot in knee   Pneumonia    PONV (postoperative nausea and vomiting)    Post menopausal problems    symptoms   Premenstrual tension syndromes    gyn uses alprazolam and spironolactone prn ofr treatment   Primary hyperparathyroidism ([place])    Psoriasis    Psoriatic arthritis ([place])    Vitamin D deficiency     Past Surgical History:  Procedure Laterality Date   CESAREAN SECTION     ENDOMETRIAL ABLATION     INSERTION OF MESH N/A 05/04/2021   Procedure: INSERTION OF MESH;   Surgeon: Ralene Ok, MD;  Location: [facility];  Service: General;  Laterality: N/A;   NASAL SINUS SURGERY     TOTAL KNEE ARTHROPLASTY Right 11/2009   TUBAL LIGATION  2004   with ablation   XI ROBOTIC ASSISTED HIATAL HERNIA REPAIR N/A 05/04/2021   Procedure: XI ROBOTIC ASSISTED HIATAL HERNIA REPAIR WITH MESH AND FUNDOPLICATION;  Surgeon: Ralene Ok, MD;  Location: [facility];  Service: General;  Laterality: N/A;    Current Outpatient Medications  Medication Sig Dispense Refill   albuterol (VENTOLIN HFA) 108 (90 Base) MCG/ACT inhaler Inhale 2 puffs into the lungs every 4 (four) hours as needed for wheezing or shortness of breath. 18 g 1   ALPRAZolam (XANAX) 0.25 MG tablet TAKE 1 TABLET AT BEDTIME AS NEEDED FOR ANXIETY 30 tablet 0   Ascorbic Acid (VITAMIN C PO) Take 1 tablet by mouth daily.     B Complex Vitamins (VITAMIN B COMPLEX PO) Take 1 tablet by mouth daily.     celecoxib (CELEBREX) 200 MG capsule      Cholecalciferol (VITAMIN D) 50 MCG (2000 UT) CAPS Take 4,000 Units by mouth daily.     clobetasol cream (TEMOVATE) 0.05 %      cycloSPORINE (RESTASIS) 0.05 % ophthalmic emulsion Place 1 drop into both eyes daily as needed (dry eyes).     EPINEPHrine (EPIPEN 2-PAK) 0.3 mg/0.3 mL IJ SOAJ injection Use as directed for severe allergic reactions (Patient taking differently: Inject 0.3 mg into the muscle as needed for anaphylaxis.) 2 Device 2   Estradiol (YUVAFEM) 10 MCG TABS vaginal tablet Use vaginally 2 times per wk 24 tablet 4   ferrous sulfate 325 (65 FE) MG tablet Take 650 mg by mouth daily with breakfast.     fluticasone (FLONASE) 50 MCG/ACT nasal spray TWO SPRAYS EACH NOSTRIL ONCE A DAY FOR NASAL CONGESTION OR DRAINAGE. 16 g 5   folic acid (FOLVITE) 1 MG tablet Take 1 mg by mouth daily.     levocetirizine (XYZAL) 5 MG tablet TAKE 1 TABLET BY MOUTH EVERY DAY IN THE EVENING 90 tablet 0   meloxicam (MOBIC) 7.5 MG tablet TAKE 1 TABLET BY MOUTH EVERY DAY AS NEEDED FOR ARTHRITIS 30 tablet 1    methotrexate (RHEUMATREX) 2.5 MG tablet Take 15 mg by mouth once a week.     montelukast (SINGULAIR) 10 MG tablet TAKE 1 TABLET BY MOUTH EVERYDAY AT BEDTIME 90 tablet 3   montelukast (SINGULAIR) 10 MG tablet      Multiple Vitamin (MULTIVITAMIN WITH MINERALS) TABS tablet Take 1 tablet by mouth daily.     nystatin (MYCOSTATIN/NYSTOP) powder Apply 1 application topically 3 (three) times daily. 15 g 1  SIMPONI ARIA 50 MG/4ML SOLN injection Inject 50 mg into the vein every 8 (eight) weeks.     spironolactone (ALDACTONE) 25 MG tablet TAKE 1 TABLET (25 MG TOTAL) BY MOUTH DAILY AS NEEDED. 90 tablet 1   traZODone (DESYREL) 100 MG tablet Take 1-1.5 tablets (100-150 mg total) by mouth at bedtime. 135 tablet 2   zinc gluconate 50 MG tablet Take 50 mg by mouth daily.     Current Facility-Administered Medications  Medication Dose Route Frequency Provider Last Rate Last Admin   0.9 %  sodium chloride infusion  500 mL Intravenous Once [name], MD         ALLERGIES: Macrodantin [nitrofurantoin macrocrystal], Cashew nut (anacardium occidentale) skin test, Cefuroxime axetil, Flavoring agent, Peanut-containing drug products, Penicillins, and Shellfish allergy  Family History  Problem Relation Age of Onset   Hypertension Mother        died from an asthma attack   Asthma Mother    Diabetes Father        died from sepsis   Hypertension Father    Arthritis Father    Ovarian cancer Sister    Melanoma Sister    Hypertension Maternal Grandfather    Heart attack Maternal Grandfather    Diabetes Paternal Grandmother    Arthritis Paternal Grandmother    Breast cancer Cousin    Migraines Neg Hx    Colon cancer Neg Hx    Esophageal cancer Neg Hx    Rectal cancer Neg Hx    Stomach cancer Neg Hx     Social History   Socioeconomic History   Marital status: Married    Spouse name: Not on file   Number of children: 2   Years of education: Not on file   Highest education level: Not on file   Occupational History    Employer: UNEMPLOYED    Comment: Administrative work  Tobacco Use   Smoking status: Never   Smokeless tobacco: Never  Vaping Use   Vaping Use: Never used  Substance and Sexual Activity   Alcohol use: No    Alcohol/week: 0.0 standard drinks   Drug use: No   Sexual activity: Not Currently    Comment: INTERCOURSE AGE 66, SEXUAL PARTNERS LESS THAN 5  Other Topics Concern   Not on file  Social History Narrative   Daughter age 31   Son age 56   Married   Enjoys Chief Executive Officer, Quarry manager shows   Social Determinants of Radio broadcast assistant Strain: Not on file  Food Insecurity: Not on file  Transportation Needs: Not on file  Physical Activity: Not on file  Stress: Not on file  Social Connections: Not on file  Intimate Partner Violence: Not on file    Review of Systems  All other systems reviewed and are negative.  PHYSICAL EXAMINATION:    BP 120/74    Pulse 80    Ht 5\' 4"  (1.626 m)    Wt 179 lb (81.2 kg)    SpO2 98%    BMI 30.73 kg/m     General appearance: alert, cooperative and appears stated age   Pelvic US Uterus 3.94 x 3.71 x 3.04 cm.  EMS 2.98 mm.  Bilateral ovaries atrophic with no masses.  No adnexal masses.  No free fluid.   ASSESSMENT  Abdominal bloating.  FH ovarian cancer.  Unremarkable pelvic US. Overactive bladder.   PLAN  Pelvic US images and report reviewed.  CA125. Signs and symptoms of ovarian cancer reviewed.  Bladder irritants reviewed.  If her symptoms persist after reducing bladder irritants, she will return for discussion of medical options for care.   An After Visit Summary was printed and given to the patient.  22 min  total time was spent for this patient encounter, including preparation, face-to-face counseling with the patient, coordination of care, and documentation of the encounter.

## 2022-01-07 LAB — CA 125: CA 125: 8 U/mL (ref <35)

## 2022-02-11 ENCOUNTER — Encounter: Payer: Self-pay | Admitting: Family Medicine

## 2022-02-11 ENCOUNTER — Ambulatory Visit (INDEPENDENT_AMBULATORY_CARE_PROVIDER_SITE_OTHER): Payer: 59 | Admitting: Family Medicine

## 2022-02-11 VITALS — BP 128/78 | HR 90 | Temp 97.9°F | Resp 16 | Ht 65.0 in | Wt 180.0 lb

## 2022-02-11 DIAGNOSIS — E559 Vitamin D deficiency, unspecified: Secondary | ICD-10-CM | POA: Diagnosis not present

## 2022-02-11 DIAGNOSIS — Z23 Encounter for immunization: Secondary | ICD-10-CM

## 2022-02-11 DIAGNOSIS — L4050 Arthropathic psoriasis, unspecified: Secondary | ICD-10-CM

## 2022-02-11 DIAGNOSIS — Z0000 Encounter for general adult medical examination without abnormal findings: Secondary | ICD-10-CM

## 2022-02-11 DIAGNOSIS — E213 Hyperparathyroidism, unspecified: Secondary | ICD-10-CM | POA: Diagnosis not present

## 2022-02-11 DIAGNOSIS — Z8639 Personal history of other endocrine, nutritional and metabolic disease: Secondary | ICD-10-CM | POA: Diagnosis not present

## 2022-02-11 MED ORDER — METHYLPREDNISOLONE ACETATE 80 MG/ML IJ SUSP
80.0000 mg | Freq: Once | INTRAMUSCULAR | Status: AC
  Administered 2022-02-11: 80 mg via INTRAMUSCULAR

## 2022-02-11 NOTE — Addendum Note (Signed)
Addended by: Sharon Seller B on: 02/11/2022 03:21 PM   Modules accepted: Orders

## 2022-02-11 NOTE — Progress Notes (Signed)
Chief Complaint  Patient presents with   Annual Exam    Here for Annual Exam      Well Woman JERMAINE THOLL is here for a complete physical.   Her last physical was >1 year ago.  Current diet: in general, a "healthy" diet. Current exercise: none. Weight is stable and she denies daytime fatigue out of the ordinary. Seatbelt? Yes Advanced directive? No  Health Maintenance Colonoscopy- Yes Shingrix- Yes DEXA- Yes Mammogram- Yes Tetanus- Yes Pneumonia- Due Hep C screen- Yes  Psoriatic arthritis Over the past week, the patient has been having increasing pain in her hands and more fatigue.  She feels her psoriatic arthritis is flaring.  She normally takes prednisone but would be interested in that shot today.  She is following with a rheumatologist and is taking methotrexate 15 mg weekly.  Past Medical History:  Diagnosis Date   Allergic rhinitis    Anemia    Anxiety    Asthma    Benign neoplasm of stomach    gastric polyps   Diaphragmatic hernia without mention of obstruction or gangrene    Diverticulosis of colon (without mention of hemorrhage)    Esophageal reflux    see GI   GERD (gastroesophageal reflux disease)    Hiatal hernia    Insomnia    Osteoarthritis    sees ortho-has had steroid shot in knee   Pneumonia    PONV (postoperative nausea and vomiting)    Post menopausal problems    symptoms   Premenstrual tension syndromes    gyn uses alprazolam and spironolactone prn ofr treatment   Primary hyperparathyroidism ([place])    Psoriasis    Psoriatic arthritis ([place])    Vitamin D deficiency      Past Surgical History:  Procedure Laterality Date   CESAREAN SECTION     ENDOMETRIAL ABLATION     INSERTION OF MESH N/A 05/04/2021   Procedure: INSERTION OF MESH;  Surgeon: Ralene Ok, MD;  Location: [facility];  Service: General;  Laterality: N/A;   NASAL SINUS SURGERY     TOTAL KNEE ARTHROPLASTY Right 11/2009   TUBAL LIGATION  2004   with ablation   XI ROBOTIC  ASSISTED HIATAL HERNIA REPAIR N/A 05/04/2021   Procedure: XI ROBOTIC ASSISTED HIATAL HERNIA REPAIR WITH MESH AND FUNDOPLICATION;  Surgeon: Ralene Ok, MD;  Location[facility];  Service: General;  Laterality: N/A;    Medications  Current Outpatient Medications on File Prior to Visit  Medication Sig Dispense Refill   albuterol (VENTOLIN HFA) 108 (90 Base) MCG/ACT inhaler Inhale 2 puffs into the lungs every 4 (four) hours as needed for wheezing or shortness of breath. 18 g 1   ALPRAZolam (XANAX) 0.25 MG tablet TAKE 1 TABLET AT BEDTIME AS NEEDED FOR ANXIETY 30 tablet 0   Ascorbic Acid (VITAMIN C PO) Take 1 tablet by mouth daily.     B Complex Vitamins (VITAMIN B COMPLEX PO) Take 1 tablet by mouth daily.     celecoxib (CELEBREX) 200 MG capsule      Cholecalciferol (VITAMIN D) 50 MCG (2000 UT) CAPS Take 4,000 Units by mouth daily.     clobetasol cream (TEMOVATE) 0.05 %      cycloSPORINE (RESTASIS) 0.05 % ophthalmic emulsion Place 1 drop into both eyes daily as needed (dry eyes).     EPINEPHrine (EPIPEN 2-PAK) 0.3 mg/0.3 mL IJ SOAJ injection Use as directed for severe allergic reactions (Patient taking differently: Inject 0.3 mg into the muscle as needed for  anaphylaxis.) 2 Device 2   Estradiol (YUVAFEM) 10 MCG TABS vaginal tablet Use vaginally 2 times per wk 24 tablet 4   ferrous sulfate 325 (65 FE) MG tablet Take 650 mg by mouth daily with breakfast.     fluticasone (FLONASE) 50 MCG/ACT nasal spray TWO SPRAYS EACH NOSTRIL ONCE A DAY FOR NASAL CONGESTION OR DRAINAGE. 16 g 5   folic acid (FOLVITE) 1 MG tablet Take 1 mg by mouth daily.     levocetirizine (XYZAL) 5 MG tablet TAKE 1 TABLET BY MOUTH EVERY DAY IN THE EVENING 90 tablet 0   meloxicam (MOBIC) 7.5 MG tablet TAKE 1 TABLET BY MOUTH EVERY DAY AS NEEDED FOR ARTHRITIS 30 tablet 1   methotrexate (RHEUMATREX) 2.5 MG tablet Take 15 mg by mouth once a week.     montelukast (SINGULAIR) 10 MG tablet TAKE 1 TABLET BY MOUTH EVERYDAY AT BEDTIME 90 tablet  3   montelukast (SINGULAIR) 10 MG tablet      Multiple Vitamin (MULTIVITAMIN WITH MINERALS) TABS tablet Take 1 tablet by mouth daily.     nystatin (MYCOSTATIN/NYSTOP) powder Apply 1 application topically 3 (three) times daily. 15 g 1   SIMPONI ARIA 50 MG/4ML SOLN injection Inject 50 mg into the vein every 8 (eight) weeks.     spironolactone (ALDACTONE) 25 MG tablet TAKE 1 TABLET (25 MG TOTAL) BY MOUTH DAILY AS NEEDED. 90 tablet 1   traZODone (DESYREL) 100 MG tablet Take 1-1.5 tablets (100-150 mg total) by mouth at bedtime. 135 tablet 2   zinc gluconate 50 MG tablet Take 50 mg by mouth daily.     Allergies Allergies  Allergen Reactions   Macrodantin [Nitrofurantoin Macrocrystal] Itching and Rash   Cashew Nut (Anacardium Occidentale) Skin Test Rash   Cefuroxime Axetil Rash and Other (See Comments)    REACTION: red face/rash   Flavoring Agent Itching and Rash   Peanut-Containing Drug Products Rash   Penicillins Rash    Tolerated Ancef intra op 05/04/2021   Shellfish Allergy Itching and Rash    Redness    Review of Systems: Constitutional:  no fevers Eye:  no recent significant change in vision Ears:  No changes in hearing Nose/Mouth/Throat:  no complaints of nasal congestion, no sore throat Cardiovascular: no chest pain Respiratory:  No shortness of breath Gastrointestinal:  No change in bowel habits GU:  Female: negative for dysuria Integumentary:  no abnormal skin lesions reported Neurologic:  no headaches Endocrine:  denies unexplained weight changes  Exam BP 128/78 (BP Location: Right Arm, Patient Position: Sitting, Cuff Size: Normal)    Pulse 90    Temp 97.9 F (36.6 C) (Oral)    Resp 16    Ht 5\' 5"  (1.651 m)    Wt 180 lb (81.6 kg)    SpO2 96%    BMI 29.95 kg/m  General:  well developed, well nourished, in no apparent distress Skin:  no significant moles, warts, or growths Head:  no masses, lesions, or tenderness Eyes:  pupils equal and round, sclera anicteric without  injection Ears:  canals without lesions, TMs shiny without retraction, no obvious effusion, no erythema Nose:  nares patent, septum midline, mucosa normal, and no drainage or sinus tenderness Throat/Pharynx:  lips and gingiva without lesion; tongue and uvula midline; non-inflamed pharynx; no exudates or postnasal drainage Neck: neck supple without adenopathy, thyromegaly, or masses Lungs:  clear to auscultation, breath sounds equal bilaterally, no respiratory distress Cardio:  regular rate and rhythm, no bruits or LE edema Abdomen:  abdomen soft, nontender; bowel sounds normal; no masses or organomegaly Genital: Deferred Neuro:  gait normal; deep tendon reflexes normal and symmetric w exception of 1/4 R patellar and 3/4 L patellar (hx of R total knee replacement) Psych: well oriented with normal range of affect and appropriate judgment/insight  Assessment and Plan  Well adult exam - Plan: CBC, Comprehensive metabolic panel, Lipid panel  Vitamin D deficiency - Plan: VITAMIN D 25 Hydroxy (Vit-D Deficiency, Fractures)  History of iron deficiency - Plan: Iron, TIBC and Ferritin Panel  Psoriatic arthritis ([place])   Well 66 y.o. female. Counseled on diet and exercise. Other orders as above. Ck Fe and vit D given hx of def for both.  PCV20 today. Advanced directive form provided today.  Psoriatic arth: exacerbation of chronic issue. Depo injection 80 mg IM today. Will follow up w rheum team for chronic management.  Follow up in 6 mo or prn. The patient voiced understanding and agreement to the plan.  [place], DO 02/11/22 3:12 PM

## 2022-02-11 NOTE — Patient Instructions (Addendum)
Give [place] 2-3 business days to get the results of your labs back.   Keep the diet clean and stay active.  Try to drink 55-60 oz of water daily outside of exercise.  Artificial tears like Refresh and Systane may be used for comfort. OK to get generic version. Generally people use them every 2-4 hours, but you can use them as much as you want because there is no medication in it.   Let us know if you need anything.

## 2022-02-12 LAB — CBC
HCT: 37.9 % (ref 35.0–45.0)
Hemoglobin: 12.6 g/dL (ref 11.7–15.5)
MCH: 30.3 pg (ref 27.0–33.0)
MCHC: 33.2 g/dL (ref 32.0–36.0)
MCV: 91.1 fL (ref 80.0–100.0)
MPV: 11.5 fL (ref 7.5–12.5)
Platelets: 302 10*3/uL (ref 140–400)
RBC: 4.16 10*6/uL (ref 3.80–5.10)
RDW: 13.4 % (ref 11.0–15.0)
WBC: 6.5 10*3/uL (ref 3.8–10.8)

## 2022-02-12 LAB — IRON,TIBC AND FERRITIN PANEL
%SAT: 16 % (calc) (ref 16–45)
Ferritin: 39 ng/mL (ref 16–288)
Iron: 58 ug/dL (ref 45–160)
TIBC: 359 mcg/dL (calc) (ref 250–450)

## 2022-02-12 LAB — COMPREHENSIVE METABOLIC PANEL
AG Ratio: 1.5 (calc) (ref 1.0–2.5)
ALT: 15 U/L (ref 6–29)
AST: 16 U/L (ref 10–35)
Albumin: 4.1 g/dL (ref 3.6–5.1)
Alkaline phosphatase (APISO): 69 U/L (ref 37–153)
BUN: 15 mg/dL (ref 7–25)
CO2: 28 mmol/L (ref 20–32)
Calcium: 9.9 mg/dL (ref 8.6–10.4)
Chloride: 104 mmol/L (ref 98–110)
Creat: 0.85 mg/dL (ref 0.50–1.05)
Globulin: 2.7 g/dL (calc) (ref 1.9–3.7)
Glucose, Bld: 91 mg/dL (ref 65–99)
Potassium: 4.8 mmol/L (ref 3.5–5.3)
Sodium: 140 mmol/L (ref 135–146)
Total Bilirubin: 0.4 mg/dL (ref 0.2–1.2)
Total Protein: 6.8 g/dL (ref 6.1–8.1)

## 2022-02-12 LAB — LIPID PANEL
Cholesterol: 181 mg/dL (ref <200)
HDL: 64 mg/dL (ref >=50)
LDL Cholesterol (Calc): 92 mg/dL (calc)
Non-HDL Cholesterol (Calc): 117 mg/dL (calc) (ref <130)
Total CHOL/HDL Ratio: 2.8 (calc) (ref <5.0)
Triglycerides: 157 mg/dL — ABNORMAL HIGH (ref <150)

## 2022-02-12 LAB — VITAMIN D 25 HYDROXY (VIT D DEFICIENCY, FRACTURES): Vit D, 25-Hydroxy: 34 ng/mL (ref 30–100)

## 2022-02-20 ENCOUNTER — Other Ambulatory Visit: Payer: Self-pay | Admitting: Nurse Practitioner

## 2022-02-21 NOTE — Telephone Encounter (Signed)
Med refill request: Xanax 0.25 mg tab PRn at bedtime for anxiety ?Last AEX: 11/30/21, TW ?Next AEX: 12/01/22, TW ?Last MMG (if hormonal med) N/A ? ?Last Rx sent 12/29/21, #30/0RF ? ? ?Tiffany,  Please Advise? ? ?

## 2022-03-08 ENCOUNTER — Other Ambulatory Visit: Payer: Self-pay | Admitting: Nurse Practitioner

## 2022-03-08 DIAGNOSIS — B369 Superficial mycosis, unspecified: Secondary | ICD-10-CM

## 2022-04-11 ENCOUNTER — Other Ambulatory Visit: Payer: Self-pay | Admitting: Nurse Practitioner

## 2022-04-11 NOTE — Telephone Encounter (Signed)
Last AEX 11/30/21 ?

## 2022-05-02 ENCOUNTER — Encounter: Payer: Self-pay | Admitting: Family Medicine

## 2022-05-02 ENCOUNTER — Other Ambulatory Visit: Payer: Self-pay | Admitting: Family Medicine

## 2022-05-02 MED ORDER — LEVOCETIRIZINE DIHYDROCHLORIDE 5 MG PO TABS: ORAL_TABLET | ORAL | 1 refills | Status: DC

## 2022-05-23 ENCOUNTER — Other Ambulatory Visit: Payer: Self-pay | Admitting: Nurse Practitioner

## 2022-05-23 DIAGNOSIS — N951 Menopausal and female climacteric states: Secondary | ICD-10-CM

## 2022-05-23 NOTE — Telephone Encounter (Signed)
Medication refill request: Xanax  Refill authorized: Please advise.   Medication refill request: Melissa Middleton  Last AEX:  11-30-21 TW Next AEX: 12-01-22  Last MMG (if hormonal medication request): 10-15-21 BIRADS 2 benign  Refill authorized: Please advise.

## 2022-06-15 ENCOUNTER — Ambulatory Visit: Payer: 59 | Admitting: Medical

## 2022-06-15 VITALS — BP 120/70 | HR 100 | Temp 98.4°F | Resp 18 | Ht 65.0 in | Wt 176.6 lb

## 2022-06-15 DIAGNOSIS — R5383 Other fatigue: Secondary | ICD-10-CM | POA: Diagnosis not present

## 2022-06-15 DIAGNOSIS — R197 Diarrhea, unspecified: Secondary | ICD-10-CM

## 2022-06-15 DIAGNOSIS — E559 Vitamin D deficiency, unspecified: Secondary | ICD-10-CM

## 2022-06-15 NOTE — Addendum Note (Signed)
Addended by: Manuela Schwartz on: 06/15/2022 04:49 PM   Modules accepted: Orders

## 2022-06-15 NOTE — Addendum Note (Signed)
Addended by: Manuela Schwartz on: 06/15/2022 04:55 PM   Modules accepted: Orders

## 2022-06-15 NOTE — Progress Notes (Signed)
Subjective:    Patient ID: Melissa Middleton, female    DOB: February 07, 1956, 66 y.o.   MRN: [id number]  HPI  Pt in with recent diarrhea for one week. Started night loose stools started. Without immodium 7-8 loose stools that were watery. Took immodium 2 yesterday and made big difference. No fever, no sweat but mild chills.  Mild fatigue since above.  No recent antibiotics  Pt notes 6 weeks ago had random severe diarrhea for 4 days. She describes that time ma.y have had virus.    Review of Systems  Constitutional:  Negative for chills, fatigue and fever.  Respiratory:  Negative for cough, chest tightness, shortness of breath and wheezing.   Cardiovascular:  Negative for chest pain and palpitations.  Gastrointestinal:  Negative for abdominal pain, diarrhea, nausea and vomiting.  Genitourinary:  Negative for dysuria and frequency.  Musculoskeletal:  Negative for back pain, myalgias and neck stiffness.    Past Medical History:  Diagnosis Date   Allergic rhinitis    Anemia    Anxiety    Asthma    Benign neoplasm of stomach    gastric polyps   Diaphragmatic hernia without mention of obstruction or gangrene    Diverticulosis of colon (without mention of hemorrhage)    Esophageal reflux    see GI   GERD (gastroesophageal reflux disease)    Hiatal hernia    Insomnia    Osteoarthritis    sees ortho-has had steroid shot in knee   Pneumonia    PONV (postoperative nausea and vomiting)    Post menopausal problems    symptoms   Premenstrual tension syndromes    gyn uses alprazolam and spironolactone prn ofr treatment   Primary hyperparathyroidism ([place])    Psoriasis    Psoriatic arthritis ([place])    Vitamin D deficiency      Social History   Socioeconomic History   Marital status: Married    Spouse name: Not on file   Number of children: 2   Years of education: Not on file   Highest education level: Not on file  Occupational History    Employer: UNEMPLOYED    Comment:  Administrative work  Tobacco Use   Smoking status: Never   Smokeless tobacco: Never  Vaping Use   Vaping Use: Never used  Substance and Sexual Activity   Alcohol use: No    Alcohol/week: 0.0 standard drinks of alcohol   Drug use: No   Sexual activity: Not Currently    Comment: INTERCOURSE AGE 56, SEXUAL PARTNERS LESS THAN 5  Other Topics Concern   Not on file  Social History Narrative   Daughter age 45   Son age 67   Married   Enjoys Chief Executive Officer, Quarry manager shows   Social Determinants of Radio broadcast assistant Strain: Not on file  Food Insecurity: Not on file  Transportation Needs: Not on file  Physical Activity: Not on file  Stress: Not on file  Social Connections: Not on file  Intimate Partner Violence: Not on file    Past Surgical History:  Procedure Laterality Date   [place] N/A 05/04/2021   Procedure: INSERTION OF MESH;  Surgeon: Ralene Ok, MD;  Location: [facility];  Service: General;  Laterality: N/A;   NASAL SINUS SURGERY     TOTAL KNEE ARTHROPLASTY Right 11/2009   TUBAL LIGATION  2004   with ablation   XI ROBOTIC ASSISTED HIATAL  HERNIA REPAIR N/A 05/04/2021   Procedure: XI ROBOTIC ASSISTED HIATAL HERNIA REPAIR WITH MESH AND FUNDOPLICATION;  Surgeon: Ralene Ok, MD;  Location: [facility];  Service: General;  Laterality: N/A;    Family History  Problem Relation Age of Onset   Hypertension Mother        died from an asthma attack   Asthma Mother    Diabetes Father        died from sepsis   Hypertension Father    Arthritis Father    Ovarian cancer Sister    Melanoma Sister    Hypertension Maternal Grandfather    Heart attack Maternal Grandfather    Diabetes Paternal Grandmother    Arthritis Paternal Grandmother    Breast cancer Cousin    Migraines Neg Hx    Colon cancer Neg Hx    Esophageal cancer Neg Hx    Rectal cancer Neg Hx    Stomach cancer Neg Hx     Allergies  Allergen Reactions    Macrodantin [Nitrofurantoin Macrocrystal] Itching and Rash   Cashew Nut (Anacardium Occidentale) Skin Test Rash   Cefuroxime Axetil Rash and Other (See Comments)    REACTION: red face/rash   Flavoring Agent Itching and Rash   Peanut-Containing Drug Products Rash   Penicillins Rash    Tolerated Ancef intra op 05/04/2021   Shellfish Allergy Itching and Rash    Redness    Current Outpatient Medications on File Prior to Visit  Medication Sig Dispense Refill   albuterol (VENTOLIN HFA) 108 (90 Base) MCG/ACT inhaler Inhale 2 puffs into the lungs every 4 (four) hours as needed for wheezing or shortness of breath. 18 g 1   ALPRAZolam (XANAX) 0.25 MG tablet TAKE 1 TABLET AT BEDTIME AS NEEDED FOR ANXIETY 30 tablet 0   Ascorbic Acid (VITAMIN C PO) Take 1 tablet by mouth daily.     B Complex Vitamins (VITAMIN B COMPLEX PO) Take 1 tablet by mouth daily.     celecoxib (CELEBREX) 200 MG capsule      Cholecalciferol (VITAMIN D) 50 MCG (2000 UT) CAPS Take 4,000 Units by mouth daily.     clobetasol cream (TEMOVATE) 0.05 %      cycloSPORINE (RESTASIS) 0.05 % ophthalmic emulsion Place 1 drop into both eyes daily as needed (dry eyes).     EPINEPHrine (EPIPEN 2-PAK) 0.3 mg/0.3 mL IJ SOAJ injection Use as directed for severe allergic reactions (Patient taking differently: Inject 0.3 mg into the muscle as needed for anaphylaxis.) 2 Device 2   Estradiol (VAGIFEM) 10 MCG TABS vaginal tablet USE VAGINALLY 2 TIMES PER WK 24 tablet 1   ferrous sulfate 325 (65 FE) MG tablet Take 650 mg by mouth daily with breakfast.     fluticasone (FLONASE) 50 MCG/ACT nasal spray TWO SPRAYS EACH NOSTRIL ONCE A DAY FOR NASAL CONGESTION OR DRAINAGE. 16 g 5   folic acid (FOLVITE) 1 MG tablet Take 1 mg by mouth daily.     levocetirizine (XYZAL) 5 MG tablet TAKE 1 TABLET BY MOUTH EVERY DAY IN THE EVENING 90 tablet 1   meloxicam (MOBIC) 7.5 MG tablet TAKE 1 TABLET BY MOUTH EVERY DAY AS NEEDED FOR ARTHRITIS 30 tablet 1   methotrexate  (RHEUMATREX) 2.5 MG tablet Take 15 mg by mouth once a week.     montelukast (SINGULAIR) 10 MG tablet TAKE 1 TABLET BY MOUTH EVERYDAY AT BEDTIME 90 tablet 3   Multiple Vitamin (MULTIVITAMIN WITH MINERALS) TABS tablet Take 1 tablet by mouth daily.  nystatin (MYCOSTATIN/NYSTOP) powder Apply 1 application topically 3 (three) times daily. 15 g 1   SIMPONI ARIA 50 MG/4ML SOLN injection Inject 50 mg into the vein every 8 (eight) weeks.     spironolactone (ALDACTONE) 25 MG tablet TAKE 1 TABLET (25 MG TOTAL) BY MOUTH DAILY AS NEEDED. 90 tablet 1   traZODone (DESYREL) 100 MG tablet Take 1-1.5 tablets (100-150 mg total) by mouth at bedtime. 135 tablet 2   zinc gluconate 50 MG tablet Take 50 mg by mouth daily.     Current Facility-Administered Medications on File Prior to Visit  Medication Dose Route Frequency Provider Last Rate Last Admin   0.9 %  sodium chloride infusion  500 mL Intravenous Once Danis, Estill Cotta III, MD        BP 120/70   Pulse 100   Temp 98.4 F (36.9 C)   Resp 18   Ht '5\' 5"'$  (1.651 m)   Wt 176 lb 9.6 oz (80.1 kg)   SpO2 99%   BMI 29.39 kg/m         Objective:   Physical Exam  General Mental Status- Alert. General Appearance- Not in acute distress.   Skin General: Color- Normal Color. Moisture- Normal Moisture.  Neck Carotid Arteries- Normal color. Moisture- Normal Moisture. No carotid bruits. No JVD.  Chest and Lung Exam Auscultation: Breath Sounds:-Normal.  Cardiovascular Auscultation:Rythm- Regular. Murmurs & Other Heart Sounds:Auscultation of the heart reveals- No Murmurs.  Abdomen Inspection:-Inspeection Normal. Palpation/Percussion:Note:No mass. Palpation and Percussion of the abdomen reveal- Non Tender, Non Distended + BS, no rebound or guarding.   Neurologic Cranial Nerve exam:- CN III-XII intact(No nystagmus), symmetric smile. Strength:- 5/5 equal and symmetric strength both upper and lower extremities.       Assessment & Plan:   Patient  Instructions  Diarrhea for 1 week.  Recently improved with using Imodium twice daily.  Considering cause viral versus bacterial.  Since diarrhea persisting up to 8 times a day without Imodium we will go ahead and get stool panel studies, culture, O&P and C. difficile.  I recommend turning no studies in tomorrow morning so results will come back quicker.  Continue Imodium twice daily.  During the interim if diarrhea worsens let me know as in that event could prescribe Cipro or other antibiotic pending culture results.  Recommend bland foods.  Can hydrate with sugar-free Gatorade or propel.  Avoid greasy, fatty foods and food juices.  For fatigue will follow CBC and metabolic panel.  Will include iron level at your request.  For vitamin D deficiency get vitamin D level.  Follow-up in 7 days or sooner if needed.   Mackie Pai, PA-C

## 2022-06-15 NOTE — Patient Instructions (Addendum)
Diarrhea for 1 week.  Recently improved with using Imodium twice daily.  Considering cause viral versus bacterial.  Since diarrhea persisting up to 8 times a day without Imodium we will go ahead and get stool panel studies, culture, O&P and C. difficile.  I recommend turning no studies in tomorrow morning so results will come back quicker.  Continue Imodium twice daily.  During the interim if diarrhea worsens let me know as in that event could prescribe Cipro or other antibiotic pending culture results.  Recommend bland foods.  Can hydrate with sugar-free Gatorade or propel.  Avoid greasy, fatty foods and food juices.  For fatigue will follow CBC and metabolic panel.  Will include iron level at your request.  For vitamin D deficiency get vitamin D level.  Follow-up in 7 days or sooner if needed.

## 2022-06-16 LAB — COMPREHENSIVE METABOLIC PANEL
ALT: 13 U/L (ref 0–35)
AST: 14 U/L (ref 0–37)
Albumin: 4.2 g/dL (ref 3.5–5.2)
Alkaline Phosphatase: 65 U/L (ref 39–117)
BUN: 10 mg/dL (ref 6–23)
CO2: 25 mEq/L (ref 19–32)
Calcium: 9.3 mg/dL (ref 8.4–10.5)
Chloride: 102 mEq/L (ref 96–112)
Creatinine, Ser: 0.86 mg/dL (ref 0.40–1.20)
GFR: 70.48 mL/min (ref >60.00)
Glucose, Bld: 94 mg/dL (ref 70–99)
Potassium: 4.2 mEq/L (ref 3.5–5.1)
Sodium: 139 mEq/L (ref 135–145)
Total Bilirubin: 0.6 mg/dL (ref 0.2–1.2)
Total Protein: 7.2 g/dL (ref 6.0–8.3)

## 2022-06-16 LAB — CBC WITH DIFFERENTIAL/PLATELET
Basophils Absolute: 0 10*3/uL (ref 0.0–0.1)
Basophils Relative: 0.6 % (ref 0.0–3.0)
Eosinophils Absolute: 0.3 10*3/uL (ref 0.0–0.7)
Eosinophils Relative: 3.4 % (ref 0.0–5.0)
HCT: 39.6 % (ref 36.0–46.0)
Hemoglobin: 13 g/dL (ref 12.0–15.0)
Lymphocytes Relative: 17.3 % (ref 12.0–46.0)
Lymphs Abs: 1.3 10*3/uL (ref 0.7–4.0)
MCHC: 32.7 g/dL (ref 30.0–36.0)
MCV: 91.8 fl (ref 78.0–100.0)
Monocytes Absolute: 0.7 10*3/uL (ref 0.1–1.0)
Monocytes Relative: 8.5 % (ref 3.0–12.0)
Neutro Abs: 5.4 10*3/uL (ref 1.4–7.7)
Neutrophils Relative %: 70.2 % (ref 43.0–77.0)
Platelets: 265 10*3/uL (ref 150.0–400.0)
RBC: 4.31 Mil/uL (ref 3.87–5.11)
RDW: 14.4 % (ref 11.5–15.5)
WBC: 7.7 10*3/uL (ref 4.0–10.5)

## 2022-06-16 LAB — VITAMIN D 25 HYDROXY (VIT D DEFICIENCY, FRACTURES): VITD: 55.03 ng/mL (ref 30.00–100.00)

## 2022-06-16 LAB — IRON: Iron: 56 ug/dL (ref 42–145)

## 2022-06-17 ENCOUNTER — Other Ambulatory Visit: Payer: Self-pay

## 2022-06-17 ENCOUNTER — Inpatient Hospital Stay (HOSPITAL_BASED_OUTPATIENT_CLINIC_OR_DEPARTMENT_OTHER)
Admission: EM | Admit: 2022-06-17 | Discharge: 2022-06-28 | DRG: 392 | Disposition: A | Discharge status: Home / Self Care | Payer: 59 | Attending: Internal Medicine | Admitting: Internal Medicine

## 2022-06-17 ENCOUNTER — Encounter (HOSPITAL_BASED_OUTPATIENT_CLINIC_OR_DEPARTMENT_OTHER): Payer: Self-pay | Admitting: Emergency Medicine

## 2022-06-17 ENCOUNTER — Telehealth: Payer: Self-pay | Admitting: Medical

## 2022-06-17 ENCOUNTER — Other Ambulatory Visit: Payer: 59

## 2022-06-17 ENCOUNTER — Emergency Department (HOSPITAL_BASED_OUTPATIENT_CLINIC_OR_DEPARTMENT_OTHER): Payer: 59

## 2022-06-17 ENCOUNTER — Encounter: Payer: Self-pay | Admitting: Medical

## 2022-06-17 DIAGNOSIS — Z803 Family history of malignant neoplasm of breast: Secondary | ICD-10-CM

## 2022-06-17 DIAGNOSIS — B9620 Unspecified Escherichia coli [E. coli] as the cause of diseases classified elsewhere: Secondary | ICD-10-CM | POA: Diagnosis present

## 2022-06-17 DIAGNOSIS — Z800 Family history of malignant neoplasm of digestive organs: Secondary | ICD-10-CM

## 2022-06-17 DIAGNOSIS — J4540 Moderate persistent asthma, uncomplicated: Secondary | ICD-10-CM | POA: Diagnosis present

## 2022-06-17 DIAGNOSIS — R7881 Bacteremia: Secondary | ICD-10-CM | POA: Diagnosis present

## 2022-06-17 DIAGNOSIS — G4700 Insomnia, unspecified: Secondary | ICD-10-CM | POA: Diagnosis present

## 2022-06-17 DIAGNOSIS — Z833 Family history of diabetes mellitus: Secondary | ICD-10-CM

## 2022-06-17 DIAGNOSIS — Z79631 Long term (current) use of antimetabolite agent: Secondary | ICD-10-CM

## 2022-06-17 DIAGNOSIS — K5730 Diverticulosis of large intestine without perforation or abscess without bleeding: Secondary | ICD-10-CM | POA: Diagnosis present

## 2022-06-17 DIAGNOSIS — Z8249 Family history of ischemic heart disease and other diseases of the circulatory system: Secondary | ICD-10-CM

## 2022-06-17 DIAGNOSIS — Z880 Allergy status to penicillin: Secondary | ICD-10-CM

## 2022-06-17 DIAGNOSIS — A09 Infectious gastroenteritis and colitis, unspecified: Principal | ICD-10-CM | POA: Diagnosis present

## 2022-06-17 DIAGNOSIS — E876 Hypokalemia: Secondary | ICD-10-CM | POA: Diagnosis present

## 2022-06-17 DIAGNOSIS — Z91013 Allergy to seafood: Secondary | ICD-10-CM

## 2022-06-17 DIAGNOSIS — K219 Gastro-esophageal reflux disease without esophagitis: Secondary | ICD-10-CM | POA: Diagnosis present

## 2022-06-17 DIAGNOSIS — Z881 Allergy status to other antibiotic agents status: Secondary | ICD-10-CM

## 2022-06-17 DIAGNOSIS — R197 Diarrhea, unspecified: Secondary | ICD-10-CM

## 2022-06-17 DIAGNOSIS — Z9889 Other specified postprocedural states: Secondary | ICD-10-CM | POA: Diagnosis present

## 2022-06-17 DIAGNOSIS — F419 Anxiety disorder, unspecified: Secondary | ICD-10-CM | POA: Diagnosis present

## 2022-06-17 DIAGNOSIS — R159 Full incontinence of feces: Secondary | ICD-10-CM

## 2022-06-17 DIAGNOSIS — Z79890 Hormone replacement therapy (postmenopausal): Secondary | ICD-10-CM

## 2022-06-17 DIAGNOSIS — E872 Acidosis, unspecified: Secondary | ICD-10-CM | POA: Diagnosis present

## 2022-06-17 DIAGNOSIS — N3281 Overactive bladder: Secondary | ICD-10-CM | POA: Diagnosis present

## 2022-06-17 DIAGNOSIS — Z91018 Allergy to other foods: Secondary | ICD-10-CM

## 2022-06-17 DIAGNOSIS — R1110 Vomiting, unspecified: Principal | ICD-10-CM

## 2022-06-17 DIAGNOSIS — E559 Vitamin D deficiency, unspecified: Secondary | ICD-10-CM | POA: Diagnosis present

## 2022-06-17 DIAGNOSIS — F32A Depression, unspecified: Secondary | ICD-10-CM | POA: Diagnosis present

## 2022-06-17 DIAGNOSIS — L4050 Arthropathic psoriasis, unspecified: Secondary | ICD-10-CM | POA: Diagnosis present

## 2022-06-17 DIAGNOSIS — R112 Nausea with vomiting, unspecified: Secondary | ICD-10-CM | POA: Diagnosis present

## 2022-06-17 DIAGNOSIS — Z79899 Other long term (current) drug therapy: Secondary | ICD-10-CM

## 2022-06-17 DIAGNOSIS — Z1623 Resistance to quinolones and fluoroquinolones: Secondary | ICD-10-CM | POA: Diagnosis present

## 2022-06-17 DIAGNOSIS — K529 Noninfective gastroenteritis and colitis, unspecified: Secondary | ICD-10-CM | POA: Diagnosis present

## 2022-06-17 DIAGNOSIS — Z808 Family history of malignant neoplasm of other organs or systems: Secondary | ICD-10-CM

## 2022-06-17 DIAGNOSIS — Z8041 Family history of malignant neoplasm of ovary: Secondary | ICD-10-CM

## 2022-06-17 DIAGNOSIS — Z96651 Presence of right artificial knee joint: Secondary | ICD-10-CM | POA: Diagnosis present

## 2022-06-17 DIAGNOSIS — N390 Urinary tract infection, site not specified: Secondary | ICD-10-CM | POA: Diagnosis present

## 2022-06-17 DIAGNOSIS — Z825 Family history of asthma and other chronic lower respiratory diseases: Secondary | ICD-10-CM

## 2022-06-17 DIAGNOSIS — Z8261 Family history of arthritis: Secondary | ICD-10-CM

## 2022-06-17 LAB — CBC WITH DIFFERENTIAL/PLATELET
Abs Immature Granulocytes: 0.03 10*3/uL (ref 0.00–0.07)
Basophils Absolute: 0 10*3/uL (ref 0.0–0.1)
Basophils Relative: 0 %
Eosinophils Absolute: 0.1 10*3/uL (ref 0.0–0.5)
Eosinophils Relative: 2 %
HCT: 40.7 % (ref 36.0–46.0)
Hemoglobin: 13.6 g/dL (ref 12.0–15.0)
Immature Granulocytes: 1 %
Lymphocytes Relative: 10 %
Lymphs Abs: 0.7 10*3/uL (ref 0.7–4.0)
MCH: 30.4 pg (ref 26.0–34.0)
MCHC: 33.4 g/dL (ref 30.0–36.0)
MCV: 90.8 fL (ref 80.0–100.0)
Monocytes Absolute: 0.5 10*3/uL (ref 0.1–1.0)
Monocytes Relative: 8 %
Neutro Abs: 5.2 10*3/uL (ref 1.7–7.7)
Neutrophils Relative %: 79 %
Platelets: 230 10*3/uL (ref 150–400)
RBC: 4.48 MIL/uL (ref 3.87–5.11)
RDW: 13.9 % (ref 11.5–15.5)
WBC: 6.5 10*3/uL (ref 4.0–10.5)
nRBC: 0 % (ref 0.0–0.2)

## 2022-06-17 LAB — URINALYSIS, ROUTINE W REFLEX MICROSCOPIC
Bilirubin Urine: NEGATIVE
Glucose, UA: NEGATIVE mg/dL
Hgb urine dipstick: NEGATIVE
Ketones, ur: NEGATIVE mg/dL
Leukocytes,Ua: NEGATIVE
Nitrite: NEGATIVE
Protein, ur: 30 mg/dL — AB
Specific Gravity, Urine: 1.01 (ref 1.005–1.030)
pH: 5.5 (ref 5.0–8.0)

## 2022-06-17 LAB — COMPREHENSIVE METABOLIC PANEL
ALT: 12 U/L (ref 0–44)
AST: 16 U/L (ref 15–41)
Albumin: 3.7 g/dL (ref 3.5–5.0)
Alkaline Phosphatase: 68 U/L (ref 38–126)
Anion gap: 9 (ref 5–15)
BUN: 14 mg/dL (ref 8–23)
CO2: 19 mmol/L — ABNORMAL LOW (ref 22–32)
Calcium: 8.9 mg/dL (ref 8.9–10.3)
Chloride: 108 mmol/L (ref 98–111)
Creatinine, Ser: 0.89 mg/dL (ref 0.44–1.00)
GFR, Estimated: >60 mL/min (ref >60)
Glucose, Bld: 163 mg/dL — ABNORMAL HIGH (ref 70–99)
Potassium: 3.6 mmol/L (ref 3.5–5.1)
Sodium: 136 mmol/L (ref 135–145)
Total Bilirubin: 0.6 mg/dL (ref 0.3–1.2)
Total Protein: 7 g/dL (ref 6.5–8.1)

## 2022-06-17 LAB — URINALYSIS, MICROSCOPIC (REFLEX)

## 2022-06-17 LAB — LACTIC ACID, PLASMA: Lactic Acid, Venous: 1.8 mmol/L (ref 0.5–1.9)

## 2022-06-17 LAB — LIPASE, BLOOD: Lipase: 21 U/L (ref 11–51)

## 2022-06-17 MED ORDER — DIPHENHYDRAMINE HCL 50 MG/ML IJ SOLN
12.5000 mg | Freq: Once | INTRAMUSCULAR | Status: AC
  Administered 2022-06-17: 12.5 mg via INTRAVENOUS
  Filled 2022-06-17: qty 1

## 2022-06-17 MED ORDER — SODIUM CHLORIDE 0.9 % IV BOLUS
1000.0000 mL | Freq: Once | INTRAVENOUS | Status: AC
  Administered 2022-06-17: 1000 mL via INTRAVENOUS

## 2022-06-17 MED ORDER — SODIUM CHLORIDE 0.9 % IV BOLUS: 1000.0000 mL | Freq: Once | INTRAVENOUS | Status: DC

## 2022-06-17 MED ORDER — CIPROFLOXACIN HCL 500 MG PO TABS
500.0000 mg | ORAL_TABLET | Freq: Once | ORAL | Status: AC
  Administered 2022-06-17: 500 mg via ORAL
  Filled 2022-06-17: qty 1

## 2022-06-17 MED ORDER — IOHEXOL 300 MG/ML  SOLN
80.0000 mL | Freq: Once | INTRAMUSCULAR | Status: AC | PRN
  Administered 2022-06-17: 80 mL via INTRAVENOUS

## 2022-06-17 MED ORDER — METOCLOPRAMIDE HCL 5 MG/ML IJ SOLN
10.0000 mg | Freq: Once | INTRAMUSCULAR | Status: AC
Start: 2022-06-17 — End: 2022-06-17
  Administered 2022-06-17: 10 mg via INTRAVENOUS
  Filled 2022-06-17: qty 2

## 2022-06-17 MED ORDER — ONDANSETRON HCL 4 MG/2ML IJ SOLN
4.0000 mg | Freq: Once | INTRAMUSCULAR | Status: AC
  Administered 2022-06-17: 4 mg via INTRAVENOUS
  Filled 2022-06-17: qty 2

## 2022-06-17 MED ORDER — BISMUTH SUBSALICYLATE 262 MG/15ML PO SUSP
30.0000 mL | Freq: Once | ORAL | Status: DC
Start: 2022-06-17 — End: 2022-06-17

## 2022-06-17 MED ORDER — PROCHLORPERAZINE EDISYLATE 10 MG/2ML IJ SOLN
10.0000 mg | Freq: Once | INTRAMUSCULAR | Status: AC
  Administered 2022-06-17: 10 mg via INTRAVENOUS
  Filled 2022-06-17: qty 2

## 2022-06-17 MED ORDER — KETOROLAC TROMETHAMINE 15 MG/ML IJ SOLN
15.0000 mg | Freq: Once | INTRAMUSCULAR | Status: AC
  Administered 2022-06-17: 15 mg via INTRAVENOUS
  Filled 2022-06-17: qty 1

## 2022-06-17 NOTE — Telephone Encounter (Signed)
Would you call pt and see how she is doing. She sent me my chart message and this is my response below. But on Fridays midafternoon to closing if pt send me message on my chart then I often don't see message until Monday. So will you try to reach out to her.  How many loose stools are you having since I last saw you? I had mentioned if the frequency was worse then would consider cipro. But if some better recommend waiting for stool studies.  Would recommend ED signs/symptoms of dehydration, weakness, fever, chillls, abdomen pain or muscle cramping etc.   Thanks,  Mackie Pai, PA-C  ===View-only below this line===   ----- Message -----      From:Melissa Middleton      Sent:06/17/2022  9:57 AM EDT        YH:[id number] Medical Advice Request Message List   Subject:Continuing diarrhea   How do I know if or when I should go to ER? You mentioned Cipro. I need something.

## 2022-06-17 NOTE — ED Notes (Signed)
Patient still cannt void

## 2022-06-17 NOTE — ED Notes (Signed)
Red cover over sanitizer

## 2022-06-17 NOTE — ED Notes (Signed)
Pt continuing to have frequent loose stools. Provider aware

## 2022-06-17 NOTE — ED Triage Notes (Signed)
Patient patient presents to ED via POV from home. Patient reports diarrhea since last Saturday. Denies abdominal pain.

## 2022-06-17 NOTE — ED Notes (Signed)
Patient states she cannt vouid

## 2022-06-17 NOTE — ED Provider Notes (Cosign Needed Addendum)
[facility] EMERGENCY DEPARTMENT Provider Note   CSN: [id number] Arrival date & time: 06/17/22  1219     History  Chief Complaint  Patient presents with   Diarrhea    Melissa Middleton is a 66 y.o. female presents the emergency department for diarrhea x6 days.  Patient states that she was seen yesterday by her PCP.  She states that after going home that she had about 25 episodes of diarrhea in the past 24 hours.  She describes it as watery, at one point noticed fleck of bright blood blood, but no others.  Denies any dark or tarry stool.  States that she started feeling nauseous yesterday as well, had 3 episodes of emesis.  Reports a similar process happening about 6 weeks ago, that only lasted for about 4 days and had no associated vomiting, and before it went away on its own.  She does take infusions of methotrexate for psoriatic arthritis, and her PCP thought that her symptoms could have been related to that.   Diarrhea Associated symptoms: chills, fever and vomiting   Associated symptoms: no abdominal pain        Home Medications Prior to Admission medications   Medication Sig Start Date End Date Taking? Authorizing Provider  albuterol (VENTOLIN HFA) 108 (90 Base) MCG/ACT inhaler Inhale 2 puffs into the lungs every 4 (four) hours as needed for wheezing or shortness of breath. 01/10/20   Garnet Sierras, DO  ALPRAZolam Duanne Moron) 0.25 MG tablet TAKE 1 TABLET AT BEDTIME AS NEEDED FOR ANXIETY 05/23/22   Marny Lowenstein A, NP  Ascorbic Acid (VITAMIN C PO) Take 1 tablet by mouth daily.    [provider]  B Complex Vitamins (VITAMIN B COMPLEX PO) Take 1 tablet by mouth daily.    [provider]  celecoxib (CELEBREX) 200 MG capsule     [provider]  Cholecalciferol (VITAMIN D) 50 MCG (2000 UT) CAPS Take 4,000 Units by mouth daily.    [provider]  clobetasol cream (TEMOVATE) 0.05 %  08/15/19   [provider]  cycloSPORINE (RESTASIS)  0.05 % ophthalmic emulsion Place 1 drop into both eyes daily as needed (dry eyes).    [provider]  EPINEPHrine (EPIPEN 2-PAK) 0.3 mg/0.3 mL IJ SOAJ injection Use as directed for severe allergic reactions Patient taking differently: Inject 0.3 mg into the muscle as needed for anaphylaxis. 11/12/18   Debbrah Alar, NP  Estradiol (VAGIFEM) 10 MCG TABS vaginal tablet USE VAGINALLY 2 TIMES PER WK 05/23/22   Marny Lowenstein A, NP  ferrous sulfate 325 (65 FE) MG tablet Take 650 mg by mouth daily with breakfast.    [provider]  fluticasone (FLONASE) 50 MCG/ACT nasal spray TWO SPRAYS EACH NOSTRIL ONCE A DAY FOR NASAL CONGESTION OR DRAINAGE. 01/05/16   Charlies Silvers, MD  folic acid (FOLVITE) 1 MG tablet Take 1 mg by mouth daily. 04/07/21   [provider]  levocetirizine (XYZAL) 5 MG tablet TAKE 1 TABLET BY MOUTH EVERY DAY IN THE EVENING 05/02/22   Wendling, Crosby Oyster, DO  meloxicam (MOBIC) 7.5 MG tablet TAKE 1 TABLET BY MOUTH EVERY DAY AS NEEDED FOR ARTHRITIS 07/12/21   Shelda Pal, DO  methotrexate (RHEUMATREX) 2.5 MG tablet Take 15 mg by mouth once a week. 12/31/19   [provider]  montelukast (SINGULAIR) 10 MG tablet TAKE 1 TABLET BY MOUTH EVERYDAY AT BEDTIME 11/29/21   Shelda Pal, DO  Multiple Vitamin (MULTIVITAMIN WITH MINERALS)  TABS tablet Take 1 tablet by mouth daily.    [provider]  nystatin (MYCOSTATIN/NYSTOP) powder Apply 1 application topically 3 (three) times daily. 11/30/21   Tamela Gammon, NP  SIMPONI ARIA 50 MG/4ML SOLN injection Inject 50 mg into the vein every 8 (eight) weeks. 10/29/19   [provider]  spironolactone (ALDACTONE) 25 MG tablet TAKE 1 TABLET (25 MG TOTAL) BY MOUTH DAILY AS NEEDED. 10/17/19   Huel Cote, NP  traZODone (DESYREL) 100 MG tablet Take 1-1.5 tablets (100-150 mg total) by mouth at bedtime. 12/29/21   Shelda Pal, DO  zinc gluconate 50 MG tablet Take  50 mg by mouth daily.    [provider]      Allergies    Macrodantin [nitrofurantoin macrocrystal], Cashew nut (anacardium occidentale) skin test, Cefuroxime axetil, Flavoring agent, Peanut-containing drug products, Penicillins, and Shellfish allergy    Review of Systems   Review of Systems  Constitutional:  Positive for chills and fever.  Gastrointestinal:  Positive for diarrhea, nausea and vomiting. Negative for abdominal pain.  Genitourinary:  Negative for dysuria and hematuria.  All other systems reviewed and are negative.   Physical Exam Updated Vital Signs BP 131/70   Pulse (!) 110   Temp 98.8 F (37.1 C) (Oral)   Resp 20   SpO2 99%  Physical Exam Vitals and nursing note reviewed.  Constitutional:      Appearance: Normal appearance.  HENT:     Head: Normocephalic and atraumatic.  Eyes:     Conjunctiva/sclera: Conjunctivae normal.  Cardiovascular:     Rate and Rhythm: Normal rate and regular rhythm.  Pulmonary:     Effort: Pulmonary effort is normal. No respiratory distress.     Breath sounds: Normal breath sounds.  Abdominal:     General: There is no distension.     Palpations: Abdomen is soft.     Tenderness: There is no abdominal tenderness.  Skin:    General: Skin is warm and dry.  Neurological:     General: No focal deficit present.     Mental Status: She is alert.     ED Results / Procedures / Treatments   Labs (all labs ordered are listed, but only abnormal results are displayed) Labs Reviewed  COMPREHENSIVE METABOLIC PANEL - Abnormal; Notable for the following components:      Result Value   CO2 19 (*)    Glucose, Bld 163 (*)    All other components within normal limits  URINALYSIS, ROUTINE W REFLEX MICROSCOPIC - Abnormal; Notable for the following components:   APPearance CLOUDY (*)    Protein, ur 30 (*)    All other components within normal limits  URINALYSIS, MICROSCOPIC (REFLEX) - Abnormal; Notable for the following components:    Bacteria, UA MANY (*)    All other components within normal limits  CULTURE, BLOOD (ROUTINE X 2)  CULTURE, BLOOD (ROUTINE X 2)  GASTROINTESTINAL PANEL BY PCR, STOOL (REPLACES STOOL CULTURE)  CBC WITH DIFFERENTIAL/PLATELET  LIPASE, BLOOD  LACTIC ACID, PLASMA    EKG EKG Interpretation  Date/Time:  Friday June 17 2022 13:09:50 EDT Ventricular Rate:  111 PR Interval:  143 QRS Duration: 103 QT Interval:  315 QTC Calculation: 428 R Axis:   3 Text Interpretation: Sinus tachycardia Confirmed by Lennice Sites ([id number]) on 06/17/2022 1:13:02 PM  Radiology CT ABDOMEN PELVIS W CONTRAST  Result Date: 06/17/2022 CLINICAL DATA:  Acute non localized abdominal pain. EXAM: CT ABDOMEN AND PELVIS WITH CONTRAST TECHNIQUE: Multidetector  CT imaging of the abdomen and pelvis was performed using the standard protocol following bolus administration of intravenous contrast. RADIATION DOSE REDUCTION: This exam was performed according to the departmental dose-optimization program which includes automated exposure control, adjustment of the mA and/or kV according to patient size and/or use of iterative reconstruction technique. CONTRAST:  80m OMNIPAQUE IOHEXOL 300 MG/ML  SOLN COMPARISON:  None Available. FINDINGS: Lower chest: Limited visualization of the lower thorax is negative for focal airspace opacity or pleural effusion. Normal heart size.  No pericardial effusion. Hepatobiliary: Normal hepatic contour. No discrete hepatic lesions. Normal appearance of the gallbladder given degree of distention. No radiopaque gallstones. No intra or extrahepatic biliary duct dilatation. No ascites. Pancreas: Normal appearance of the pancreas. Spleen: Normal appearance of the spleen. Adrenals/Urinary Tract: There is symmetric enhancement and excretion of the bilateral kidneys. No evidence of nephrolithiasis on this postcontrast examination. No discrete renal lesions. No urine obstruction or perinephric stranding Normal appearance of  the bilateral adrenal glands. Normal appearance of the urinary bladder given degree of distention. Stomach/Bowel: Rather extensive colonic diverticulosis, primarily involving the distal descending and sigmoid colon, without definitive evidence of superimposed acute diverticulitis. Moderate to large liquid stool burden, not resulting in enteric obstruction. Normal appearance of the terminal ileum and the retrocecal appendix. Small hiatal hernia. No pneumoperitoneum, pneumatosis or portal venous gas. Vascular/Lymphatic: Misty appearance of the root of the abdominal mesentery within the left mid abdomen with scattered prominent though non pathologically enlarged mesenteric lymph nodes with index lymph nodes measuring 0.5 - 0.7 cm in diameter (representative images 30, 38, 40 and 44, series 2). No bulky retroperitoneal, mesenteric, pelvic or inguinal lymph adenopathy. Reproductive: Post bilateral tubal ligation. No discrete adnexal lesions. No free fluid within the pelvic cul-de-sac. Other: Regional soft tissues appear unremarkable. Musculoskeletal: No acute or aggressive osseous abnormalities moderate multilevel lumbar spine DDD, worse at L1-L2 and L2-L3 with disc space height loss, endplate irregularity and small posteriorly directed disc osteophyte complexes at these locations. IMPRESSION: 1. Misty appearance of the root of the abdominal mesentery, nonspecific though potentially could be seen in the setting of a mesenteric adenitis, less likely secondary to a lymphoproliferative disorder. Clinical correlation is advised. 2. Rather extensive colonic diverticulosis, primarily involving the distal descending and sigmoid colon, without evidence of superimposed acute diverticulitis. If not recently performed, further evaluation with colonoscopy could be performed as indicated. 3. Moderate to large liquid stool burden without evidence of enteric obstruction. Electronically Signed   By: JSandi MariscalM.D.   On: 06/17/2022  16:43    Procedures .Critical Care  Performed by: RKateri Plummer PA-C Authorized by: RKateri Plummer PA-C   Critical care provider statement:    Critical care time (minutes):  30   Critical care was necessary to treat or prevent imminent or life-threatening deterioration of the following conditions:  Dehydration   Critical care was time spent personally by me on the following activities:  Development of treatment plan with patient or surrogate, discussions with consultants, evaluation of patient's response to treatment, examination of patient, ordering and review of laboratory studies, ordering and review of radiographic studies, ordering and performing treatments and interventions, pulse oximetry, re-evaluation of patient's condition and review of old charts   Care discussed with: admitting provider       Medications Ordered in ED Medications  sodium chloride 0.9 % bolus 1,000 mL ( Intravenous Stopped 06/17/22 1418)  ondansetron (ZOFRAN) injection 4 mg (4 mg Intravenous Given 06/17/22 1309)  ketorolac (TORADOL) 15  MG/ML injection 15 mg (15 mg Intravenous Given 06/17/22 1406)  prochlorperazine (COMPAZINE) injection 10 mg (10 mg Intravenous Given 06/17/22 1406)  diphenhydrAMINE (BENADRYL) injection 12.5 mg (12.5 mg Intravenous Given 06/17/22 1405)  iohexol (OMNIPAQUE) 300 MG/ML solution 80 mL (80 mLs Intravenous Contrast Given 06/17/22 1601)  sodium chloride 0.9 % bolus 1,000 mL (1,000 mLs Intravenous New Bag/Given 06/17/22 1708)  ciprofloxacin (CIPRO) tablet 500 mg (500 mg Oral Given 06/17/22 1721)  metoCLOPramide (REGLAN) injection 10 mg (10 mg Intravenous Given 06/17/22 1801)    ED Course/ Medical Decision Making/ A&P                           Medical Decision Making Amount and/or Complexity of Data Reviewed Labs: ordered. Radiology: ordered.  Risk Prescription drug management. Decision regarding hospitalization.  This patient is a 65 y.o. female who presents to the ED for  concern of diarrhea, this involves an extensive number of treatment options, and is a complaint that carries with it a high risk of complications and morbidity. The emergent differential diagnosis prior to evaluation includes, but is not limited to,  Infectious causes such as: Viral, Bacterial, Parasitic; Toxin. Noninfectious causes such as: GI Bleed, Appendicitis, Mesenteric Ischemia, Diverticulitis, endocrine causes (adrenal, thyroid), Toxicologic exposures, Drug-associated, IBD.  This is not an exhaustive differential.   Past Medical History / Co-morbidities / Social History: Anxiety, diverticulosis, GERD, hyperparathyroidism, psoriatic arthritis  Additional history: Chart reviewed. Pertinent results include: Patient was seen at PCP on 6/28 for diarrhea x1 week.  They reported that she had some improvement using Imodium.  At that time she was having up to 8 episodes of diarrhea per day, so they ordered stool panel studies, culture, and C. difficile testing.  She was able to drop off the studies this morning.  In the interim she started having more significant diarrhea, reported about 25 episodes in the past 24 hours.  She contacted her PCP, and they recommended that she come to the emergency department for evaluation.  Physical Exam: Physical exam performed. The pertinent findings include: Patient initially febrile to 100.5 F, tachycardic to 122.  Abdomen soft, nontender, overall nonsurgical.  Patient appears slightly pale and fatigued.  Lab Tests: I ordered, and personally interpreted labs.  The pertinent results include: No significant leukocytosis, hemoglobin normal.  Glucose 163, otherwise electrolytes within normal limits.  Normal kidney and liver function.  Normal lipase.  Lactic acid 1.8.   Imaging Studies: I ordered imaging studies including CT abdomen pelvis. I independently visualized and interpreted imaging which showed some nonspecific mesenteric findings, and colonic diverticulosis  without evidence of diverticulitis.  Large liquid stool burden without obstruction.. I agree with the radiologist interpretation.   Cardiac Monitoring:  The patient was maintained on a cardiac monitor.  My attending physician Dr. Ronnald Nian viewed and interpreted the cardiac monitored which showed an underlying rhythm of: sinus tachycardia. I agree with this interpretation.   Medications: I ordered medication including IV fluids, antiemetics, and antibiotics for dehydration, vomiting, and possibly infectious diarrhea. Reevaluation of the patient after these medicines showed that the patient stayed the same. I have reviewed the patients home medicines and have made adjustments as needed.  Consultations Obtained: I requested consultation with the hospitalist Dr. Trilby Drummer,  and discussed lab and imaging findings as well as pertinent plan - they recommend: medical admission   Disposition: After consideration of the diagnostic results and the patients response to treatment, I feel that patient would benefit  from admission for intractable nausea, vomiting, and diarrhea.  Final Clinical Impression(s) / ED Diagnoses Final diagnoses:  Intractable vomiting  Diarrhea of presumed infectious origin    Rx / DC Orders ED Discharge Orders     None      Portions of this report may have been transcribed using voice recognition software. Every effort was made to ensure accuracy; however, inadvertent computerized transcription errors may be present.   Kateri Plummer, PA-C 06/17/22 [place], [place], DO 06/20/22 [phone]

## 2022-06-17 NOTE — Telephone Encounter (Signed)
Patient sent a message that she is going to the ED now.

## 2022-06-18 DIAGNOSIS — J4541 Moderate persistent asthma with (acute) exacerbation: Secondary | ICD-10-CM

## 2022-06-18 DIAGNOSIS — F419 Anxiety disorder, unspecified: Secondary | ICD-10-CM | POA: Diagnosis not present

## 2022-06-18 DIAGNOSIS — N3281 Overactive bladder: Secondary | ICD-10-CM

## 2022-06-18 DIAGNOSIS — Z1623 Resistance to quinolones and fluoroquinolones: Secondary | ICD-10-CM | POA: Diagnosis not present

## 2022-06-18 DIAGNOSIS — A09 Infectious gastroenteritis and colitis, unspecified: Secondary | ICD-10-CM

## 2022-06-18 DIAGNOSIS — R112 Nausea with vomiting, unspecified: Secondary | ICD-10-CM | POA: Diagnosis not present

## 2022-06-18 DIAGNOSIS — R197 Diarrhea, unspecified: Secondary | ICD-10-CM | POA: Diagnosis present

## 2022-06-18 DIAGNOSIS — L4050 Arthropathic psoriasis, unspecified: Secondary | ICD-10-CM

## 2022-06-18 DIAGNOSIS — E559 Vitamin D deficiency, unspecified: Secondary | ICD-10-CM | POA: Diagnosis not present

## 2022-06-18 DIAGNOSIS — E876 Hypokalemia: Secondary | ICD-10-CM | POA: Diagnosis not present

## 2022-06-18 DIAGNOSIS — K5730 Diverticulosis of large intestine without perforation or abscess without bleeding: Secondary | ICD-10-CM | POA: Diagnosis not present

## 2022-06-18 DIAGNOSIS — Z880 Allergy status to penicillin: Secondary | ICD-10-CM | POA: Diagnosis not present

## 2022-06-18 DIAGNOSIS — Z79631 Long term (current) use of antimetabolite agent: Secondary | ICD-10-CM | POA: Diagnosis not present

## 2022-06-18 DIAGNOSIS — B9620 Unspecified Escherichia coli [E. coli] as the cause of diseases classified elsewhere: Secondary | ICD-10-CM | POA: Diagnosis not present

## 2022-06-18 DIAGNOSIS — G4700 Insomnia, unspecified: Secondary | ICD-10-CM | POA: Diagnosis not present

## 2022-06-18 DIAGNOSIS — Z91018 Allergy to other foods: Secondary | ICD-10-CM | POA: Diagnosis not present

## 2022-06-18 DIAGNOSIS — E872 Acidosis, unspecified: Secondary | ICD-10-CM | POA: Diagnosis present

## 2022-06-18 DIAGNOSIS — J4540 Moderate persistent asthma, uncomplicated: Secondary | ICD-10-CM | POA: Diagnosis not present

## 2022-06-18 DIAGNOSIS — K529 Noninfective gastroenteritis and colitis, unspecified: Secondary | ICD-10-CM

## 2022-06-18 DIAGNOSIS — Z91013 Allergy to seafood: Secondary | ICD-10-CM | POA: Diagnosis not present

## 2022-06-18 DIAGNOSIS — N390 Urinary tract infection, site not specified: Secondary | ICD-10-CM | POA: Diagnosis not present

## 2022-06-18 DIAGNOSIS — Z9889 Other specified postprocedural states: Secondary | ICD-10-CM

## 2022-06-18 DIAGNOSIS — K219 Gastro-esophageal reflux disease without esophagitis: Secondary | ICD-10-CM | POA: Diagnosis not present

## 2022-06-18 DIAGNOSIS — Z881 Allergy status to other antibiotic agents status: Secondary | ICD-10-CM | POA: Diagnosis not present

## 2022-06-18 DIAGNOSIS — F32A Depression, unspecified: Secondary | ICD-10-CM

## 2022-06-18 DIAGNOSIS — Z79899 Other long term (current) drug therapy: Secondary | ICD-10-CM | POA: Diagnosis not present

## 2022-06-18 DIAGNOSIS — Z96651 Presence of right artificial knee joint: Secondary | ICD-10-CM | POA: Diagnosis not present

## 2022-06-18 LAB — CBC
HCT: 38.4 % (ref 36.0–46.0)
Hemoglobin: 12.7 g/dL (ref 12.0–15.0)
MCH: 30.7 pg (ref 26.0–34.0)
MCHC: 33.1 g/dL (ref 30.0–36.0)
MCV: 92.8 fL (ref 80.0–100.0)
Platelets: 230 10*3/uL (ref 150–400)
RBC: 4.14 MIL/uL (ref 3.87–5.11)
RDW: 14.1 % (ref 11.5–15.5)
WBC: 6.6 10*3/uL (ref 4.0–10.5)
nRBC: 0 % (ref 0.0–0.2)

## 2022-06-18 LAB — C DIFFICILE QUICK SCREEN W PCR REFLEX
C Diff antigen: NEGATIVE
C Diff interpretation: NOT DETECTED
C Diff toxin: NEGATIVE

## 2022-06-18 LAB — HIV ANTIBODY (ROUTINE TESTING W REFLEX): HIV Screen 4th Generation wRfx: NONREACTIVE

## 2022-06-18 LAB — RENAL FUNCTION PANEL
Albumin: 3 g/dL — ABNORMAL LOW (ref 3.5–5.0)
Anion gap: 7 (ref 5–15)
BUN: 8 mg/dL (ref 8–23)
CO2: 19 mmol/L — ABNORMAL LOW (ref 22–32)
Calcium: 8.2 mg/dL — ABNORMAL LOW (ref 8.9–10.3)
Chloride: 114 mmol/L — ABNORMAL HIGH (ref 98–111)
Creatinine, Ser: 0.83 mg/dL (ref 0.44–1.00)
GFR, Estimated: >60 mL/min (ref >60)
Glucose, Bld: 98 mg/dL (ref 70–99)
Phosphorus: 2.1 mg/dL — ABNORMAL LOW (ref 2.5–4.6)
Potassium: 3.2 mmol/L — ABNORMAL LOW (ref 3.5–5.1)
Sodium: 140 mmol/L (ref 135–145)

## 2022-06-18 LAB — GASTROINTESTINAL PANEL BY PCR, STOOL (REPLACES STOOL CULTURE)

## 2022-06-18 LAB — COMPREHENSIVE METABOLIC PANEL
ALT: 9 U/L (ref 0–44)
AST: 11 U/L — ABNORMAL LOW (ref 15–41)
Albumin: 2.9 g/dL — ABNORMAL LOW (ref 3.5–5.0)
Alkaline Phosphatase: 45 U/L (ref 38–126)
Anion gap: 8 (ref 5–15)
BUN: 9 mg/dL (ref 8–23)
CO2: 19 mmol/L — ABNORMAL LOW (ref 22–32)
Calcium: 8.1 mg/dL — ABNORMAL LOW (ref 8.9–10.3)
Chloride: 114 mmol/L — ABNORMAL HIGH (ref 98–111)
Creatinine, Ser: 0.8 mg/dL (ref 0.44–1.00)
GFR, Estimated: >60 mL/min (ref >60)
Glucose, Bld: 110 mg/dL — ABNORMAL HIGH (ref 70–99)
Potassium: 2.9 mmol/L — ABNORMAL LOW (ref 3.5–5.1)
Sodium: 141 mmol/L (ref 135–145)
Total Bilirubin: 0.6 mg/dL (ref 0.3–1.2)
Total Protein: 5.6 g/dL — ABNORMAL LOW (ref 6.5–8.1)

## 2022-06-18 LAB — MAGNESIUM: Magnesium: 1.7 mg/dL (ref 1.7–2.4)

## 2022-06-18 LAB — PHOSPHORUS: Phosphorus: 2.6 mg/dL (ref 2.5–4.6)

## 2022-06-18 LAB — CREATININE, SERUM
Creatinine, Ser: 0.72 mg/dL (ref 0.44–1.00)
GFR, Estimated: >60 mL/min (ref >60)

## 2022-06-18 MED ORDER — SODIUM CHLORIDE 0.9 % IV SOLN: INTRAVENOUS | Status: DC

## 2022-06-18 MED ORDER — MONTELUKAST SODIUM 10 MG PO TABS
10.0000 mg | ORAL_TABLET | Freq: Every day | ORAL | Status: DC
  Administered 2022-06-18 – 2022-06-27 (×10): 10 mg via ORAL
  Filled 2022-06-18 (×10): qty 1

## 2022-06-18 MED ORDER — ALBUTEROL SULFATE (2.5 MG/3ML) 0.083% IN NEBU: 2.5000 mg | INHALATION_SOLUTION | Freq: Four times a day (QID) | RESPIRATORY_TRACT | Status: DC | PRN

## 2022-06-18 MED ORDER — TRAZODONE HCL 100 MG PO TABS
100.0000 mg | ORAL_TABLET | Freq: Every day | ORAL | Status: DC
  Administered 2022-06-18 – 2022-06-27 (×10): 100 mg via ORAL
  Filled 2022-06-18 (×10): qty 1

## 2022-06-18 MED ORDER — LOPERAMIDE HCL 2 MG PO CAPS
2.0000 mg | ORAL_CAPSULE | ORAL | Status: DC | PRN
  Administered 2022-06-18: 2 mg via ORAL
  Filled 2022-06-18: qty 1

## 2022-06-18 MED ORDER — AZITHROMYCIN 250 MG PO TABS
500.0000 mg | ORAL_TABLET | Freq: Every day | ORAL | Status: DC
  Administered 2022-06-18: 500 mg via ORAL
  Filled 2022-06-18: qty 2

## 2022-06-18 MED ORDER — LORAZEPAM 2 MG/ML IJ SOLN
0.5000 mg | Freq: Once | INTRAMUSCULAR | Status: AC
  Administered 2022-06-18: 0.5 mg via INTRAVENOUS
  Filled 2022-06-18: qty 1

## 2022-06-18 MED ORDER — ENOXAPARIN SODIUM 40 MG/0.4ML IJ SOSY
40.0000 mg | PREFILLED_SYRINGE | Freq: Every day | INTRAMUSCULAR | Status: DC
  Administered 2022-06-18 – 2022-06-19 (×2): 40 mg via SUBCUTANEOUS
  Filled 2022-06-18 (×2): qty 0.4

## 2022-06-18 MED ORDER — ALPRAZOLAM 0.25 MG PO TABS
0.2500 mg | ORAL_TABLET | Freq: Every evening | ORAL | Status: DC | PRN
Start: 2022-06-18 — End: 2022-06-19
  Administered 2022-06-18: 0.25 mg via ORAL
  Filled 2022-06-18: qty 1

## 2022-06-18 MED ORDER — MAGNESIUM SULFATE 2 GM/50ML IV SOLN
2.0000 g | Freq: Once | INTRAVENOUS | Status: AC
  Administered 2022-06-18: 2 g via INTRAVENOUS
  Filled 2022-06-18: qty 50

## 2022-06-18 MED ORDER — PANTOPRAZOLE SODIUM 40 MG PO TBEC
40.0000 mg | DELAYED_RELEASE_TABLET | Freq: Every day | ORAL | Status: DC
  Administered 2022-06-18 – 2022-06-25 (×8): 40 mg via ORAL
  Filled 2022-06-18 (×8): qty 1

## 2022-06-18 MED ORDER — ORAL CARE MOUTH RINSE: 15.0000 mL | OROMUCOSAL | Status: DC | PRN

## 2022-06-18 MED ORDER — POTASSIUM CHLORIDE IN NACL 20-0.9 MEQ/L-% IV SOLN
INTRAVENOUS | Status: DC
  Filled 2022-06-18 (×7): qty 1000

## 2022-06-18 MED ORDER — POTASSIUM CHLORIDE CRYS ER 20 MEQ PO TBCR
40.0000 meq | EXTENDED_RELEASE_TABLET | ORAL | Status: DC
  Administered 2022-06-18: 40 meq via ORAL
  Filled 2022-06-18: qty 2

## 2022-06-18 MED ORDER — ONDANSETRON HCL 4 MG/2ML IJ SOLN
4.0000 mg | Freq: Four times a day (QID) | INTRAMUSCULAR | Status: DC | PRN
  Administered 2022-06-20 – 2022-06-23 (×3): 4 mg via INTRAVENOUS
  Filled 2022-06-18 (×3): qty 2

## 2022-06-18 MED ORDER — POTASSIUM CHLORIDE CRYS ER 20 MEQ PO TBCR
40.0000 meq | EXTENDED_RELEASE_TABLET | Freq: Once | ORAL | Status: AC
  Administered 2022-06-18: 40 meq via ORAL
  Filled 2022-06-18: qty 2

## 2022-06-18 NOTE — Progress Notes (Signed)
Nurse contacted provider Nevada Crane, MD in reference to patient's frequent bowel movements. Patient is ambulating to the [facility] every five or so minutes due to bowel movements. The bowel movements are tan, water, and a some formed pieces noted. No new orders at this time. Will continue to monitor.

## 2022-06-18 NOTE — H&P (Addendum)
History and Physical    Melissa Middleton YNW:[id number] DOB: 06/07/1956 DOA: 06/17/2022  PCP: Shelda Pal, DO Patient coming from: Home.  Lives with husband.  Chief Complaint: Diarrhea  HPI: Melissa Middleton is a 66 year old F with PMH of GERD, anxiety, depression, insomnia, Nissen fundoplication, seasonal allergies, osteoarthritis, and psoriasis on methotrexate presented to [facility] with diarrhea for about a week, nausea, vomiting and fever for 1 day.  She was admitted for intractable nausea, vomiting, diarrhea and fever.   Patient saw her PCP on 6/28 for diarrhea.  At that time, stool panels including culture, O&P and C. difficile were ordered but there was an error with sample collection.  She was advised to maintain good hydration until stool cultures are back.  She also reached out to her rheumatologist and advised to stop her methotrexate.  In ED, febrile to 100.5.  Tachycardic to 122.  Normotensive.  CBC and CMP without significant finding.  Lactic acid 1.8.  UA with many bacteria.  CT abdomen and pelvis with nonspecific misty appearance of root of the abdominal mesentery which could be seen in the setting of mesenteric adenitis, colonic diverticulosis without diverticulitis and moderate to large liquid stool burden without evidence of enteric obstruction.  C. difficile, GIP, blood cultures, stool ova and parasite exam ordered.  She was started on Cipro.  Admission accepted overnight and she was evaluated by me this morning.  Patient reports frequent watery bowel movements for 1 week.  Reports countless numbers of bowel movement daily that she has to use diapers.  She also reports nausea, vomiting and fever the morning of her presentation to ED. at the time of my evaluation, nausea and vomiting has resolved.  Diarrhea has improved.  Patient denies sick contact or new food.  She denies runny nose, sore throat, chest pain, dyspnea, cough or abdominal pain.  She denies UTI symptoms.   She had a colonoscopy in 12/2020 that showed diverticulosis.  EGD at the same time showed large hiatal hernia, erosive gastropathy and multiple fundic gland polyps.  She denies new medication or using stool softener.  Patient denies history of IBD, personal or family history of colon cancer.  Denies drinking from well.  Denies smoking cigarette, drinking alcohol recreational drug use.  Prefers to remain full code.  ROS All review of system negative except for pertinent positives and negatives as history of present illness above.  PMH Past Medical History:  Diagnosis Date   Allergic rhinitis    Anemia    Anxiety    Asthma    Benign neoplasm of stomach    gastric polyps   Diaphragmatic hernia without mention of obstruction or gangrene    Diverticulosis of colon (without mention of hemorrhage)    Esophageal reflux    see GI   GERD (gastroesophageal reflux disease)    Hiatal hernia    Insomnia    Osteoarthritis    sees ortho-has had steroid shot in knee   Pneumonia    PONV (postoperative nausea and vomiting)    Post menopausal problems    symptoms   Premenstrual tension syndromes    gyn uses alprazolam and spironolactone prn ofr treatment   Primary hyperparathyroidism ([place])    Psoriasis    Psoriatic arthritis ([place])    Vitamin D deficiency    PSH Past Surgical History:  Procedure Laterality Date   CESAREAN SECTION     ENDOMETRIAL ABLATION     INSERTION OF MESH N/A 05/04/2021   Procedure: INSERTION  OF MESH;  Surgeon: Ralene Ok, MD;  Location: [facility] OR;  Service: General;  Laterality: N/A;   NASAL SINUS SURGERY     TOTAL KNEE ARTHROPLASTY Right 11/2009   TUBAL LIGATION  2004   with ablation   XI ROBOTIC ASSISTED HIATAL HERNIA REPAIR N/A 05/04/2021   Procedure: XI ROBOTIC ASSISTED HIATAL HERNIA REPAIR WITH MESH AND FUNDOPLICATION;  Surgeon: Ralene Ok, MD;  Location: [place];  Service: General;  Laterality: N/A;   Fam HX Family History  Problem Relation Age of Onset    Hypertension Mother        died from an asthma attack   Asthma Mother    Diabetes Father        died from sepsis   Hypertension Father    Arthritis Father    Ovarian cancer Sister    Melanoma Sister    Hypertension Maternal Grandfather    Heart attack Maternal Grandfather    Diabetes Paternal Grandmother    Arthritis Paternal Grandmother    Breast cancer Cousin    Migraines Neg Hx    Colon cancer Neg Hx    Esophageal cancer Neg Hx    Rectal cancer Neg Hx    Stomach cancer Neg Hx     Social Hx  reports that she has never smoked. She has never used smokeless tobacco. She reports that she does not drink alcohol and does not use drugs.  Allergy Allergies  Allergen Reactions   Macrodantin [Nitrofurantoin Macrocrystal] Itching and Rash   Cashew Nut (Anacardium Occidentale) Skin Test Rash   Cefuroxime Axetil Rash and Other (See Comments)    REACTION: red face/rash   Flavoring Agent Itching and Rash   Peanut-Containing Drug Products Rash   Penicillins Rash    Tolerated Ancef intra op 05/04/2021   Shellfish Allergy Itching and Rash    Redness   Home Meds Prior to Admission medications   Medication Sig Start Date End Date Taking? Authorizing Provider  albuterol (VENTOLIN HFA) 108 (90 Base) MCG/ACT inhaler Inhale 2 puffs into the lungs every 4 (four) hours as needed for wheezing or shortness of breath. 01/10/20  Yes Garnet Sierras, DO  ALPRAZolam (XANAX) 0.25 MG tablet TAKE 1 TABLET AT BEDTIME AS NEEDED FOR ANXIETY Patient taking differently: Take by mouth at bedtime as needed for anxiety. TAKE 1 TABLET AT BEDTIME AS NEEDED FOR ANXIETY 05/23/22  Yes Marny Lowenstein A, NP  Ascorbic Acid (VITAMIN C PO) Take 1 tablet by mouth daily.   Yes [provider]  B Complex Vitamins (VITAMIN B COMPLEX PO) Take 1 tablet by mouth daily.   Yes [provider]  celecoxib (CELEBREX) 200 MG capsule Take 200 mg by mouth as needed for mild pain.   Yes [provider]   Cholecalciferol (VITAMIN D) 50 MCG (2000 UT) CAPS Take 4,000 Units by mouth daily.   Yes [provider]  EPINEPHrine (EPIPEN 2-PAK) 0.3 mg/0.3 mL IJ SOAJ injection Use as directed for severe allergic reactions Patient taking differently: Inject 0.3 mg into the muscle as needed for anaphylaxis. 11/12/18  Yes Debbrah Alar, NP  Estradiol (VAGIFEM) 10 MCG TABS vaginal tablet USE VAGINALLY 2 TIMES PER WK Patient taking differently: Place 10 mcg vaginally. USE VAGINALLY 2 TIMES PER WK 05/23/22  Yes Juleen China, Tiffany A, NP  fluticasone (FLONASE) 50 MCG/ACT nasal spray TWO SPRAYS EACH NOSTRIL ONCE A DAY FOR NASAL CONGESTION OR DRAINAGE. Patient taking differently: Place into both nostrils daily as needed for allergies. 01/05/16  Yes  Charlies Silvers, MD  folic acid (FOLVITE) 1 MG tablet Take 1 mg by mouth daily. 04/07/21  Yes [provider]  levocetirizine (XYZAL) 5 MG tablet TAKE 1 TABLET BY MOUTH EVERY DAY IN THE EVENING Patient taking differently: Take 5 mg by mouth every evening. 05/02/22  Yes Shelda Pal, DO  methotrexate (RHEUMATREX) 2.5 MG tablet Take 15 mg by mouth once a week. 12/31/19  Yes [provider]  montelukast (SINGULAIR) 10 MG tablet TAKE 1 TABLET BY MOUTH EVERYDAY AT BEDTIME Patient taking differently: Take 10 mg by mouth at bedtime. 11/29/21  Yes Shelda Pal, DO  Multiple Vitamin (MULTIVITAMIN WITH MINERALS) TABS tablet Take 1 tablet by mouth daily.   Yes [provider]  nystatin (MYCOSTATIN/NYSTOP) powder Apply 1 application topically 3 (three) times daily. 11/30/21  Yes Marny Lowenstein A, NP  SIMPONI ARIA 50 MG/4ML SOLN injection Inject 50 mg into the vein every 8 (eight) weeks. 10/29/19  Yes [provider]  spironolactone (ALDACTONE) 25 MG tablet TAKE 1 TABLET (25 MG TOTAL) BY MOUTH DAILY AS NEEDED. 10/17/19  Yes Huel Cote, NP  traZODone (DESYREL) 100 MG tablet Take 1-1.5 tablets (100-150 mg total) by  mouth at bedtime. 12/29/21  Yes Shelda Pal, DO  zinc gluconate 50 MG tablet Take 50 mg by mouth daily.   Yes [provider]  meloxicam (MOBIC) 7.5 MG tablet TAKE 1 TABLET BY MOUTH EVERY DAY AS NEEDED FOR ARTHRITIS Patient not taking: Reported on 06/18/2022 07/12/21   Shelda Pal, DO  methocarbamol (ROBAXIN) 500 MG tablet TAKE 1 TABLET BY MOUTH EVERY DAY AT BEDTIME AS NEEDED Patient not taking: Reported on 06/18/2022 09/10/18   [provider]    Physical Exam: Vitals:   06/18/22 0000 06/18/22 0342 06/18/22 0637 06/18/22 1048  BP: (!) 100/51 123/74 113/76 (!) 104/57  Pulse: (!) 107 (!) 104 (!) 102 91  Resp: '20 19 20 18  '$ Temp:  99.4 F (37.4 C) 99.3 F (37.4 C) 99.2 F (37.3 C)  TempSrc:      SpO2: 96% 97% 96% 97%    GENERAL: No acute distress.  Appears well.  HEENT: MMM.  Vision and hearing grossly intact.  NECK: Supple.  No apparent JVD.  RESP:  No IWOB. Good air movement bilaterally. CVS:  RRR. Heart sounds normal.  ABD/GI/GU: Bowel sounds present. Soft. Non tender.  MSK/EXT:  Moves extremities. No apparent deformity or edema.  SKIN: no apparent skin lesion or wound NEURO: Awake, alert and oriented appropriately.  No gross deficit.  PSYCH: Calm. Normal affect.    Personally Reviewed Radiological Exams See HPI   Personally Reviewed Labs: CBC: Recent Labs  Lab 06/15/22 1655 06/17/22 1250 06/18/22 0556  WBC 7.7 6.5 6.6  NEUTROABS 5.4 5.2  --   HGB 13.0 13.6 12.7  HCT 39.6 40.7 38.4  MCV 91.8 90.8 92.8  PLT 265.0 230 379   Basic Metabolic Panel: Recent Labs  Lab 06/15/22 1655 06/17/22 1250 06/18/22 0556 06/18/22 0757  NA 139 136  --  141  K 4.2 3.6  --  2.9*  CL 102 108  --  114*  CO2 25 19*  --  19*  GLUCOSE 94 163*  --  110*  BUN 10 14  --  9  CREATININE 0.86 0.89 0.72 0.80  CALCIUM 9.3 8.9  --  8.1*  MG  --   --   --  1.7  PHOS  --   --   --  2.6   GFR: Estimated Creatinine Clearance: 72.3 mL/min (by C-G  formula based on SCr of 0.8 mg/dL). Liver Function Tests: Recent Labs  Lab 06/15/22 1655 06/17/22 1250 06/18/22 0757  AST 14 16 11*  ALT '13 12 9  '$ ALKPHOS 65 68 45  BILITOT 0.6 0.6 0.6  PROT 7.2 7.0 5.6*  ALBUMIN 4.2 3.7 2.9*   Recent Labs  Lab 06/17/22 1250  LIPASE 21   No results for input(s): "AMMONIA" in the last 168 hours. Coagulation Profile: No results for input(s): "INR", "PROTIME" in the last 168 hours. Cardiac Enzymes: No results for input(s): "CKTOTAL", "CKMB", "CKMBINDEX", "TROPONINI" in the last 168 hours. BNP (last 3 results) No results for input(s): "PROBNP" in the last 8760 hours. HbA1C: No results for input(s): "HGBA1C" in the last 72 hours. CBG: No results for input(s): "GLUCAP" in the last 168 hours. Lipid Profile: No results for input(s): "CHOL", "HDL", "LDLCALC", "TRIG", "CHOLHDL", "LDLDIRECT" in the last 72 hours. Thyroid Function Tests: No results for input(s): "TSH", "T4TOTAL", "FREET4", "T3FREE", "THYROIDAB" in the last 72 hours. Anemia Panel: Recent Labs    06/15/22 1655  IRON 56   Urine analysis:    Component Value Date/Time   COLORURINE YELLOW 06/17/2022 1638   APPEARANCEUR CLOUDY (A) 06/17/2022 1638   LABSPEC 1.010 06/17/2022 1638   PHURINE 5.5 06/17/2022 1638   GLUCOSEU NEGATIVE 06/17/2022 1638   GLUCOSEU NEGATIVE 10/27/2020 1115   HGBUR NEGATIVE 06/17/2022 1638   HGBUR large 07/16/2010 1408   BILIRUBINUR NEGATIVE 06/17/2022 1638   BILIRUBINUR neg 10/26/2015 1745   KETONESUR NEGATIVE 06/17/2022 1638   PROTEINUR 30 (A) 06/17/2022 1638   UROBILINOGEN 0.2 10/27/2020 1115   NITRITE NEGATIVE 06/17/2022 1638   LEUKOCYTESUR NEGATIVE 06/17/2022 1638     Assessment and plan: Principal Problem:   Intractable nausea and vomiting Active Problems:   INSOMNIA, CHRONIC   Anxiety and depression   Gastroesophageal reflux disease without esophagitis   Moderate persistent asthma   OAB (overactive bladder)   Psoriatic arthritis (H[facility])    S/P Nissen fundoplication (without gastrostomy tube) procedure   Diarrhea   Gastroenteritis  Nausea/vomiting/diarrhea/fever-likely gastroenteritis.  No further nausea and vomiting.  Diarrhea subsided.  Abdominal exam benign.  CT abdomen and pelvis with nonspecific misty appearance of root of abdominal mesentery, extensive colonic diverticulosis and diarrheal stool in colon.  Symptoms improved.  C. difficile negative. -Received Cipro in ED.  Zithromax 500 mg daily for 3 days -Follow GI panel and stool ova and parasite -Start soft diet -IV NS-KCl 20 at 125 cc an hour -Zofran as needed  History of anxiety/depression/insomnia: Stable. -Continue home medications  GERD/history of Nissen fundoplication: Does not seem to be on PPI. -P.o. Protonix  None anion gap metabolic acidosis: Likely due to diarrhea. Hypokalemia: K2.9.  Mg 1.7.  Likely due to GI loss. -P.o. KCl 40x3 -IV magnesium sulfate 1 g x 1 -Renal panel this afternoon  History of psoriasis-patient was advised to stop methotrexate after discussion with her rheumatologist. -Hold methotrexate  DVT prophylaxis: Subcu Lovenox  Code Status: Full code Family Communication: Updated patient's husband at bedside  Consults called: None Admission status: Observation   Mercy Riding MD [facility]  If 7PM-7AM, please contact night-coverage [email or web address]  06/18/2022, 11:31 AM

## 2022-06-19 DIAGNOSIS — N390 Urinary tract infection, site not specified: Secondary | ICD-10-CM | POA: Diagnosis present

## 2022-06-19 DIAGNOSIS — E876 Hypokalemia: Secondary | ICD-10-CM | POA: Diagnosis present

## 2022-06-19 DIAGNOSIS — R1110 Vomiting, unspecified: Secondary | ICD-10-CM | POA: Diagnosis not present

## 2022-06-19 DIAGNOSIS — E872 Acidosis, unspecified: Secondary | ICD-10-CM | POA: Diagnosis present

## 2022-06-19 DIAGNOSIS — G4700 Insomnia, unspecified: Secondary | ICD-10-CM | POA: Diagnosis present

## 2022-06-19 DIAGNOSIS — R159 Full incontinence of feces: Secondary | ICD-10-CM | POA: Diagnosis not present

## 2022-06-19 DIAGNOSIS — K529 Noninfective gastroenteritis and colitis, unspecified: Secondary | ICD-10-CM | POA: Diagnosis not present

## 2022-06-19 DIAGNOSIS — L4050 Arthropathic psoriasis, unspecified: Secondary | ICD-10-CM | POA: Diagnosis present

## 2022-06-19 DIAGNOSIS — Z96651 Presence of right artificial knee joint: Secondary | ICD-10-CM | POA: Diagnosis present

## 2022-06-19 DIAGNOSIS — B9620 Unspecified Escherichia coli [E. coli] as the cause of diseases classified elsewhere: Secondary | ICD-10-CM | POA: Diagnosis present

## 2022-06-19 DIAGNOSIS — Z880 Allergy status to penicillin: Secondary | ICD-10-CM | POA: Diagnosis not present

## 2022-06-19 DIAGNOSIS — F418 Other specified anxiety disorders: Secondary | ICD-10-CM | POA: Diagnosis not present

## 2022-06-19 DIAGNOSIS — Z79631 Long term (current) use of antimetabolite agent: Secondary | ICD-10-CM | POA: Diagnosis not present

## 2022-06-19 DIAGNOSIS — F32A Depression, unspecified: Secondary | ICD-10-CM | POA: Diagnosis present

## 2022-06-19 DIAGNOSIS — M1990 Unspecified osteoarthritis, unspecified site: Secondary | ICD-10-CM | POA: Diagnosis not present

## 2022-06-19 DIAGNOSIS — Z79899 Other long term (current) drug therapy: Secondary | ICD-10-CM | POA: Diagnosis not present

## 2022-06-19 DIAGNOSIS — Z1623 Resistance to quinolones and fluoroquinolones: Secondary | ICD-10-CM | POA: Diagnosis present

## 2022-06-19 DIAGNOSIS — K5730 Diverticulosis of large intestine without perforation or abscess without bleeding: Secondary | ICD-10-CM | POA: Diagnosis present

## 2022-06-19 DIAGNOSIS — F419 Anxiety disorder, unspecified: Secondary | ICD-10-CM | POA: Diagnosis present

## 2022-06-19 DIAGNOSIS — Z91018 Allergy to other foods: Secondary | ICD-10-CM | POA: Diagnosis not present

## 2022-06-19 DIAGNOSIS — R197 Diarrhea, unspecified: Secondary | ICD-10-CM

## 2022-06-19 DIAGNOSIS — Z881 Allergy status to other antibiotic agents status: Secondary | ICD-10-CM | POA: Diagnosis not present

## 2022-06-19 DIAGNOSIS — J189 Pneumonia, unspecified organism: Secondary | ICD-10-CM | POA: Diagnosis not present

## 2022-06-19 DIAGNOSIS — E559 Vitamin D deficiency, unspecified: Secondary | ICD-10-CM | POA: Diagnosis present

## 2022-06-19 DIAGNOSIS — R112 Nausea with vomiting, unspecified: Secondary | ICD-10-CM | POA: Diagnosis not present

## 2022-06-19 DIAGNOSIS — Z91013 Allergy to seafood: Secondary | ICD-10-CM | POA: Diagnosis not present

## 2022-06-19 DIAGNOSIS — J4540 Moderate persistent asthma, uncomplicated: Secondary | ICD-10-CM | POA: Diagnosis present

## 2022-06-19 DIAGNOSIS — A09 Infectious gastroenteritis and colitis, unspecified: Secondary | ICD-10-CM | POA: Diagnosis present

## 2022-06-19 DIAGNOSIS — K219 Gastro-esophageal reflux disease without esophagitis: Secondary | ICD-10-CM | POA: Diagnosis present

## 2022-06-19 DIAGNOSIS — N3281 Overactive bladder: Secondary | ICD-10-CM | POA: Diagnosis present

## 2022-06-19 LAB — BASIC METABOLIC PANEL
Anion gap: 6 (ref 5–15)
BUN: 6 mg/dL — ABNORMAL LOW (ref 8–23)
CO2: 17 mmol/L — ABNORMAL LOW (ref 22–32)
Calcium: 8 mg/dL — ABNORMAL LOW (ref 8.9–10.3)
Chloride: 117 mmol/L — ABNORMAL HIGH (ref 98–111)
Creatinine, Ser: 0.73 mg/dL (ref 0.44–1.00)
GFR, Estimated: >60 mL/min (ref >60)
Glucose, Bld: 89 mg/dL (ref 70–99)
Potassium: 3.7 mmol/L (ref 3.5–5.1)
Sodium: 140 mmol/L (ref 135–145)

## 2022-06-19 LAB — MAGNESIUM: Magnesium: 2.3 mg/dL (ref 1.7–2.4)

## 2022-06-19 LAB — PHOSPHORUS: Phosphorus: 1.7 mg/dL — ABNORMAL LOW (ref 2.5–4.6)

## 2022-06-19 MED ORDER — ACETAMINOPHEN 325 MG PO TABS
650.0000 mg | ORAL_TABLET | Freq: Four times a day (QID) | ORAL | Status: DC | PRN
Start: 2022-06-19 — End: 2022-06-28
  Administered 2022-06-19 – 2022-06-23 (×2): 650 mg via ORAL
  Filled 2022-06-19 (×2): qty 2

## 2022-06-19 MED ORDER — ALPRAZOLAM 0.25 MG PO TABS
0.2500 mg | ORAL_TABLET | Freq: Three times a day (TID) | ORAL | Status: DC | PRN
  Administered 2022-06-19 – 2022-06-26 (×14): 0.25 mg via ORAL
  Filled 2022-06-19 (×14): qty 1

## 2022-06-19 MED ORDER — LOPERAMIDE HCL 2 MG PO CAPS
2.0000 mg | ORAL_CAPSULE | Freq: Four times a day (QID) | ORAL | Status: DC | PRN
  Administered 2022-06-19 – 2022-06-20 (×2): 2 mg via ORAL
  Filled 2022-06-19 (×2): qty 1

## 2022-06-19 MED ORDER — LOPERAMIDE HCL 2 MG PO CAPS
4.0000 mg | ORAL_CAPSULE | Freq: Once | ORAL | Status: AC
  Administered 2022-06-19: 4 mg via ORAL
  Filled 2022-06-19: qty 2

## 2022-06-19 NOTE — Progress Notes (Signed)
She said we should test her urine again for UTI, The NT just emptied her urine and she said it was not cloudy or any foul odor. Notified Ouma  NP as well and she said no to testing U/A. Tylenol 650 mg  oral  PRN received. Will administer and continue to monitor.

## 2022-06-19 NOTE — Progress Notes (Signed)
PROGRESS NOTE    Melissa Middleton  KCL:[id number] DOB: [birth year] DOA: 06/17/2022 PCP: Shelda Pal, DO   Brief Narrative:  66 year old F with PMH of GERD, anxiety, depression, insomnia, Nissen fundoplication, seasonal allergies, osteoarthritis, and psoriasis on methotrexate presented with nausea, vomiting, fever with worsening diarrhea.  On presentation, he was febrile 200.5, tachycardic. CT abdomen and pelvis with nonspecific misty appearance of root of the abdominal mesentery which could be seen in the setting of mesenteric adenitis, colonic diverticulosis without diverticulitis and moderate to large liquid stool burden without evidence of enteric obstruction.  She was started on IV fluids.  Assessment & Plan:   Possible acute gastroenteritis presenting with nausea/vomiting/diarrhea and fever -Still having significant diarrhea and has a rectal tube currently. -CT of abdomen/pelvis as above -Stool testing negative for C. difficile and GI PCR.  DC Zithromax. -Does not feel ready to go home today. -Give 1 dose of Imodium 4 mg now and continue Imodium 2 mg every 6 hours as needed. -If continues to have diarrhea by tomorrow, will consult GI -Zofran as needed  Anxiety/depression/insomnia -Continue trazodone and Xanax as needed.  Hypokalemia -Resolved  Hypophosphatemia -Mild  Non anion gap metabolic acidosis -Diarrhea.  Still acidotic.  Continue IV fluids.  Encourage oral intake.  History of psoriasis -Methotrexate on hold.  Outpatient follow-up with rheumatology.  DVT prophylaxis: Lovenox Code Status: Full Family Communication: None at bedside Disposition Plan: Status is: Observation The patient will require care spanning > 2 midnights and should be moved to inpatient because: Because of persistent diarrhea.  Need for IV fluids.    Consultants: None  Procedures: None Antimicrobials:  Anti-infectives (From admission, onward)    Start     Dose/Rate Route  Frequency Ordered Stop   06/18/22 1700  azithromycin (ZITHROMAX) tablet 500 mg  Status:  Discontinued        500 mg Oral Daily 06/18/22 0857 06/19/22 0935   06/17/22 1715  ciprofloxacin (CIPRO) tablet 500 mg        500 mg Oral  Once 06/17/22 1714 06/17/22 1721        Subjective: Patient seen and examined at bedside.  Does not feel good.  Worried about her diarrhea which is not slowing down.  Currently has a rectal tube.  No overnight fever, worsening abdominal pain, vomiting reported.  Objective: Vitals:   06/18/22 1048 06/18/22 1355 06/18/22 2033 06/19/22 0512  BP: (!) 104/57 110/79 118/78 118/68  Pulse: 91 (!) 107 (!) 106 82  Resp: '18 18 18 18  '$ Temp: 99.2 F (37.3 C) 98 F (36.7 C) 98.5 F (36.9 C) 98.4 F (36.9 C)  TempSrc:  Oral Oral Oral  SpO2: 97% 96% 99% 99%    Intake/Output Summary (Last 24 hours) at 06/19/2022 1100 Last data filed at 06/19/2022 0600 Gross per 24 hour  Intake 990 ml  Output 900 ml  Net 90 ml   There were no vitals filed for this visit.  Examination:  General exam: Appears calm and comfortable.  Currently on room air. Respiratory system: Bilateral decreased breath sounds at bases Cardiovascular system: S1 & S2 heard, mildly tachycardic intermittently gastrointestinal system: Abdomen is nondistended, soft and nontender. Normal bowel sounds heard. Extremities: No cyanosis, clubbing, edema  Central nervous system: Alert and oriented. No focal neurological deficits. Moving extremities Skin: No rashes, lesions or ulcers Psychiatry: Looks intermittently anxious.  No signs of agitation.   Data Reviewed: I have personally reviewed following labs and imaging studies  CBC: Recent Labs  Lab  06/15/22 1655 06/17/22 1250 06/18/22 0556  WBC 7.7 6.5 6.6  NEUTROABS 5.4 5.2  --   HGB 13.0 13.6 12.7  HCT 39.6 40.7 38.4  MCV 91.8 90.8 92.8  PLT 265.0 230 629   Basic Metabolic Panel: Recent Labs  Lab 06/15/22 1655 06/17/22 1250 06/18/22 0556  06/18/22 0757 06/18/22 1633 06/19/22 0603  NA 139 136  --  141 140 140  K 4.2 3.6  --  2.9* 3.2* 3.7  CL 102 108  --  114* 114* 117*  CO2 25 19*  --  19* 19* 17*  GLUCOSE 94 163*  --  110* 98 89  BUN 10 14  --  9 8 6*  CREATININE 0.86 0.89 0.72 0.80 0.83 0.73  CALCIUM 9.3 8.9  --  8.1* 8.2* 8.0*  MG  --   --   --  1.7  --  2.3  PHOS  --   --   --  2.6 2.1* 1.7*   GFR: Estimated Creatinine Clearance: 72.3 mL/min (by C-G formula based on SCr of 0.73 mg/dL). Liver Function Tests: Recent Labs  Lab 06/15/22 1655 06/17/22 1250 06/18/22 0757 06/18/22 1633  AST 14 16 11*  --   ALT '13 12 9  '$ --   ALKPHOS 65 68 45  --   BILITOT 0.6 0.6 0.6  --   PROT 7.2 7.0 5.6*  --   ALBUMIN 4.2 3.7 2.9* 3.0*   Recent Labs  Lab 06/17/22 1250  LIPASE 21   No results for input(s): "AMMONIA" in the last 168 hours. Coagulation Profile: No results for input(s): "INR", "PROTIME" in the last 168 hours. Cardiac Enzymes: No results for input(s): "CKTOTAL", "CKMB", "CKMBINDEX", "TROPONINI" in the last 168 hours. BNP (last 3 results) No results for input(s): "PROBNP" in the last 8760 hours. HbA1C: No results for input(s): "HGBA1C" in the last 72 hours. CBG: No results for input(s): "GLUCAP" in the last 168 hours. Lipid Profile: No results for input(s): "CHOL", "HDL", "LDLCALC", "TRIG", "CHOLHDL", "LDLDIRECT" in the last 72 hours. Thyroid Function Tests: No results for input(s): "TSH", "T4TOTAL", "FREET4", "T3FREE", "THYROIDAB" in the last 72 hours. Anemia Panel: No results for input(s): "VITAMINB12", "FOLATE", "FERRITIN", "TIBC", "IRON", "RETICCTPCT" in the last 72 hours. Sepsis Labs: Recent Labs  Lab 06/17/22 1323  LATICACIDVEN 1.8    Recent Results (from the past 240 hour(s))  Blood culture (routine x 2)     Status: None (Preliminary result)   Collection Time: 06/17/22 12:49 PM   Specimen: Right Antecubital; Blood  Result Value Ref Range Status   Specimen Description   Final    RIGHT  ANTECUBITAL BLOOD Performed at [facility], [address] [place], [place] [address]    Special Requests   Final    Blood Culture adequate volume BOTTLES DRAWN AEROBIC AND ANAEROBIC Performed at [facility], [address], [place], [place] [address]    Culture   Final    NO GROWTH 2 DAYS Performed at [facility], [place] [address] [place], [place] [address]    Report Status PENDING  Incomplete  Blood culture (routine x 2)     Status: None (Preliminary result)   Collection Time: 06/17/22  2:01 PM   Specimen: Left Antecubital; Blood  Result Value Ref Range Status   Specimen Description   Final    LEFT ANTECUBITAL BLOOD Performed at [facility], [address] [place],  [address]    Special  Requests   Final    Blood Culture adequate volume BOTTLES DRAWN AEROBIC AND ANAEROBIC Performed at [facility], [address] [place], [place] [address]    Culture   Final    NO GROWTH 2 DAYS Performed at [facility], [place] [address] [place], [place] [address]    Report Status PENDING  Incomplete  Gastrointestinal Panel by PCR , Stool     Status: None   Collection Time: 06/17/22  5:01 PM   Specimen: Stool  Result Value Ref Range Status   Campylobacter species NOT DETECTED NOT DETECTED Final   Plesimonas shigelloides NOT DETECTED NOT DETECTED Final   Salmonella species NOT DETECTED NOT DETECTED Final   Yersinia enterocolitica NOT DETECTED NOT DETECTED Final   Vibrio species NOT DETECTED NOT DETECTED Final   Vibrio cholerae NOT DETECTED NOT DETECTED Final   Enteroaggregative E coli (EAEC) NOT DETECTED NOT DETECTED Final   Enteropathogenic E coli (EPEC) NOT DETECTED NOT DETECTED Final   Enterotoxigenic E coli (ETEC) NOT DETECTED NOT DETECTED Final   Shiga like toxin producing E coli (STEC) NOT DETECTED NOT DETECTED Final   Shigella/Enteroinvasive E coli (EIEC) NOT DETECTED NOT DETECTED Final   Cryptosporidium NOT  DETECTED NOT DETECTED Final   Cyclospora cayetanensis NOT DETECTED NOT DETECTED Final   Entamoeba histolytica NOT DETECTED NOT DETECTED Final   Giardia lamblia NOT DETECTED NOT DETECTED Final   Adenovirus F40/41 NOT DETECTED NOT DETECTED Final   Astrovirus NOT DETECTED NOT DETECTED Final   Norovirus GI/GII NOT DETECTED NOT DETECTED Final   Rotavirus A NOT DETECTED NOT DETECTED Final   Sapovirus (I, II, IV, and V) NOT DETECTED NOT DETECTED Final    Comment: Performed at [facility], [address] [place], [place] [address]  C Difficile Quick Screen w PCR reflex     Status: None   Collection Time: 06/18/22  3:42 AM   Specimen: STOOL  Result Value Ref Range Status   C Diff antigen NEGATIVE NEGATIVE Final   C Diff toxin NEGATIVE NEGATIVE Final   C Diff interpretation No C. difficile detected.  Final    Comment: Performed at [facility], [place] [address], [place], [place] [address]         Radiology Studies: CT ABDOMEN PELVIS W CONTRAST  Result Date: 06/17/2022 CLINICAL DATA:  Acute non localized abdominal pain. EXAM: CT ABDOMEN AND PELVIS WITH CONTRAST TECHNIQUE: Multidetector CT imaging of the abdomen and pelvis was performed using the standard protocol following bolus administration of intravenous contrast. RADIATION DOSE REDUCTION: This exam was performed according to the departmental dose-optimization program which includes automated exposure control, adjustment of the mA and/or kV according to patient size and/or use of iterative reconstruction technique. CONTRAST:  46m OMNIPAQUE IOHEXOL 300 MG/ML  SOLN COMPARISON:  None Available. FINDINGS: Lower chest: Limited visualization of the lower thorax is negative for focal airspace opacity or pleural effusion. Normal heart size.  No pericardial effusion. Hepatobiliary: Normal hepatic contour. No discrete hepatic lesions. Normal appearance of the gallbladder given degree of distention. No radiopaque gallstones. No  intra or extrahepatic biliary duct dilatation. No ascites. Pancreas: Normal appearance of the pancreas. Spleen: Normal appearance of the spleen. Adrenals/Urinary Tract: There is symmetric enhancement and excretion of the bilateral kidneys. No evidence of nephrolithiasis on this postcontrast examination. No discrete renal lesions. No urine obstruction or perinephric stranding Normal appearance of the bilateral adrenal glands. Normal appearance of the urinary bladder given degree of distention.  Stomach/Bowel: Rather extensive colonic diverticulosis, primarily involving the distal descending and sigmoid colon, without definitive evidence of superimposed acute diverticulitis. Moderate to large liquid stool burden, not resulting in enteric obstruction. Normal appearance of the terminal ileum and the retrocecal appendix. Small hiatal hernia. No pneumoperitoneum, pneumatosis or portal venous gas. Vascular/Lymphatic: Misty appearance of the root of the abdominal mesentery within the left mid abdomen with scattered prominent though non pathologically enlarged mesenteric lymph nodes with index lymph nodes measuring 0.5 - 0.7 cm in diameter (representative images 30, 38, 40 and 44, series 2). No bulky retroperitoneal, mesenteric, pelvic or inguinal lymph adenopathy. Reproductive: Post bilateral tubal ligation. No discrete adnexal lesions. No free fluid within the pelvic cul-de-sac. Other: Regional soft tissues appear unremarkable. Musculoskeletal: No acute or aggressive osseous abnormalities moderate multilevel lumbar spine DDD, worse at L1-L2 and L2-L3 with disc space height loss, endplate irregularity and small posteriorly directed disc osteophyte complexes at these locations. IMPRESSION: 1. Misty appearance of the root of the abdominal mesentery, nonspecific though potentially could be seen in the setting of a mesenteric adenitis, less likely secondary to a lymphoproliferative disorder. Clinical correlation is advised. 2.  Rather extensive colonic diverticulosis, primarily involving the distal descending and sigmoid colon, without evidence of superimposed acute diverticulitis. If not recently performed, further evaluation with colonoscopy could be performed as indicated. 3. Moderate to large liquid stool burden without evidence of enteric obstruction. Electronically Signed   By: Sandi Mariscal M.D.   On: 06/17/2022 16:43        Scheduled Meds:  enoxaparin (LOVENOX) injection  40 mg Subcutaneous Daily   loperamide  4 mg Oral Once   montelukast  10 mg Oral QHS   pantoprazole  40 mg Oral Daily   traZODone  100 mg Oral QHS   Continuous Infusions:  0.9 % NaCl with KCl 20 mEq / L 125 mL/hr at 06/19/22 0551          Aline August, MD [facility] 06/19/2022, 11:00 AM  '

## 2022-06-19 NOTE — Progress Notes (Signed)
The patient said she feels hot in her body. Rechecked temp is 100.4 oral. Also c/o pain in her head and sacrum of 8/10 on a pain scale. She prefers Advil for her condition per her statement. Ouma NP notified and she stated Advil is not a good choice due to her condition. Patient said she wants Tylenol then.

## 2022-06-19 NOTE — Progress Notes (Signed)
Nurse gave updates to provider Nevada Crane, MD in reference to patient's bowel movements. There has been no change in amount of watery stools. Primary nurse also informed the doctor that patient has not voided during the shift. After bladder scan patient was noted to 57m in her bladder. Morning were reordered by provider. Isolation precautions discontinue per the results of GI panel and C.diff. labs. Will continue to monitor.

## 2022-06-20 ENCOUNTER — Inpatient Hospital Stay (HOSPITAL_COMMUNITY): Payer: 59 | Admitting: Anesthesiology

## 2022-06-20 ENCOUNTER — Encounter (HOSPITAL_COMMUNITY)
Admission: EM | Disposition: A | Discharge status: Home / Self Care | Payer: Self-pay | Source: Home / Self Care | Attending: Internal Medicine

## 2022-06-20 ENCOUNTER — Telehealth: Payer: Self-pay

## 2022-06-20 ENCOUNTER — Encounter (HOSPITAL_COMMUNITY): Payer: Self-pay | Admitting: Internal Medicine

## 2022-06-20 ENCOUNTER — Telehealth: Payer: Self-pay | Admitting: Medical

## 2022-06-20 DIAGNOSIS — K5730 Diverticulosis of large intestine without perforation or abscess without bleeding: Secondary | ICD-10-CM

## 2022-06-20 DIAGNOSIS — J189 Pneumonia, unspecified organism: Secondary | ICD-10-CM

## 2022-06-20 DIAGNOSIS — K529 Noninfective gastroenteritis and colitis, unspecified: Secondary | ICD-10-CM | POA: Diagnosis not present

## 2022-06-20 DIAGNOSIS — F419 Anxiety disorder, unspecified: Secondary | ICD-10-CM | POA: Diagnosis not present

## 2022-06-20 DIAGNOSIS — F418 Other specified anxiety disorders: Secondary | ICD-10-CM

## 2022-06-20 DIAGNOSIS — R197 Diarrhea, unspecified: Secondary | ICD-10-CM | POA: Diagnosis not present

## 2022-06-20 DIAGNOSIS — R112 Nausea with vomiting, unspecified: Secondary | ICD-10-CM | POA: Diagnosis not present

## 2022-06-20 DIAGNOSIS — M1990 Unspecified osteoarthritis, unspecified site: Secondary | ICD-10-CM

## 2022-06-20 HISTORY — PX: COLONOSCOPY WITH PROPOFOL: SHX5780

## 2022-06-20 HISTORY — PX: BIOPSY: SHX5522

## 2022-06-20 LAB — CBC WITH DIFFERENTIAL/PLATELET
Abs Immature Granulocytes: 0.2 10*3/uL — ABNORMAL HIGH (ref 0.00–0.07)
Basophils Absolute: 0 10*3/uL (ref 0.0–0.1)
Basophils Relative: 0 %
Eosinophils Absolute: 0 10*3/uL (ref 0.0–0.5)
Eosinophils Relative: 0 %
HCT: 42.1 % (ref 36.0–46.0)
Hemoglobin: 13.5 g/dL (ref 12.0–15.0)
Immature Granulocytes: 2 %
Lymphocytes Relative: 20 %
Lymphs Abs: 2.2 10*3/uL (ref 0.7–4.0)
MCH: 30.6 pg (ref 26.0–34.0)
MCHC: 32.1 g/dL (ref 30.0–36.0)
MCV: 95.5 fL (ref 80.0–100.0)
Monocytes Absolute: 1.4 10*3/uL — ABNORMAL HIGH (ref 0.1–1.0)
Monocytes Relative: 13 %
Neutro Abs: 7.1 10*3/uL (ref 1.7–7.7)
Neutrophils Relative %: 65 %
Platelets: 265 10*3/uL (ref 150–400)
RBC: 4.41 MIL/uL (ref 3.87–5.11)
RDW: 14.4 % (ref 11.5–15.5)
WBC: 10.9 10*3/uL — ABNORMAL HIGH (ref 4.0–10.5)
nRBC: 0 % (ref 0.0–0.2)

## 2022-06-20 LAB — BASIC METABOLIC PANEL
Anion gap: 6 (ref 5–15)
BUN: <5 mg/dL — ABNORMAL LOW (ref 8–23)
CO2: 16 mmol/L — ABNORMAL LOW (ref 22–32)
Calcium: 8.2 mg/dL — ABNORMAL LOW (ref 8.9–10.3)
Chloride: 117 mmol/L — ABNORMAL HIGH (ref 98–111)
Creatinine, Ser: 0.76 mg/dL (ref 0.44–1.00)
GFR, Estimated: >60 mL/min (ref >60)
Glucose, Bld: 93 mg/dL (ref 70–99)
Potassium: 4.1 mmol/L (ref 3.5–5.1)
Sodium: 139 mmol/L (ref 135–145)

## 2022-06-20 LAB — MAGNESIUM: Magnesium: 1.9 mg/dL (ref 1.7–2.4)

## 2022-06-20 LAB — CLOSTRIDIUM DIFFICILE BY PCR: Toxigenic C. Difficile by PCR: NEGATIVE

## 2022-06-20 SURGERY — COLONOSCOPY WITH PROPOFOL
Anesthesia: Monitor Anesthesia Care

## 2022-06-20 MED ORDER — LOPERAMIDE HCL 2 MG PO CAPS
4.0000 mg | ORAL_CAPSULE | Freq: Four times a day (QID) | ORAL | Status: DC
  Filled 2022-06-20: qty 2

## 2022-06-20 MED ORDER — ENOXAPARIN SODIUM 40 MG/0.4ML IJ SOSY
40.0000 mg | PREFILLED_SYRINGE | Freq: Every day | INTRAMUSCULAR | Status: DC
  Administered 2022-06-21 – 2022-06-28 (×8): 40 mg via SUBCUTANEOUS
  Filled 2022-06-20 (×8): qty 0.4

## 2022-06-20 MED ORDER — PEG-KCL-NACL-NASULF-NA ASC-C 100 G PO SOLR
1.0000 | Freq: Once | ORAL | Status: AC
  Administered 2022-06-20: 200 g via ORAL
  Filled 2022-06-20: qty 1

## 2022-06-20 MED ORDER — LACTATED RINGERS IV SOLN: INTRAVENOUS | Status: DC | PRN

## 2022-06-20 MED ORDER — ONDANSETRON HCL 4 MG/2ML IJ SOLN
INTRAMUSCULAR | Status: DC | PRN
  Administered 2022-06-20: 4 mg via INTRAVENOUS

## 2022-06-20 MED ORDER — PROPOFOL 1000 MG/100ML IV EMUL
INTRAVENOUS | Status: AC
  Filled 2022-06-20: qty 100

## 2022-06-20 MED ORDER — PHENYLEPHRINE 80 MCG/ML (10ML) SYRINGE FOR IV PUSH (FOR BLOOD PRESSURE SUPPORT)
PREFILLED_SYRINGE | INTRAVENOUS | Status: DC | PRN
  Administered 2022-06-20 (×2): 80 ug via INTRAVENOUS

## 2022-06-20 MED ORDER — SODIUM BICARBONATE 8.4 % IV SOLN
INTRAVENOUS | Status: DC
  Filled 2022-06-20 (×2): qty 150
  Filled 2022-06-20: qty 1000

## 2022-06-20 MED ORDER — DIPHENOXYLATE-ATROPINE 2.5-0.025 MG PO TABS
1.0000 | ORAL_TABLET | Freq: Four times a day (QID) | ORAL | Status: DC
  Administered 2022-06-20 – 2022-06-21 (×3): 1 via ORAL
  Filled 2022-06-20 (×3): qty 1

## 2022-06-20 MED ORDER — DIPHENOXYLATE-ATROPINE 2.5-0.025 MG PO TABS
1.0000 | ORAL_TABLET | Freq: Four times a day (QID) | ORAL | Status: DC | PRN
Start: 2022-06-20 — End: 2022-06-20

## 2022-06-20 MED ORDER — SODIUM CHLORIDE 0.9 % IV SOLN: INTRAVENOUS | Status: DC

## 2022-06-20 MED ORDER — PROPOFOL 500 MG/50ML IV EMUL
INTRAVENOUS | Status: DC | PRN
  Administered 2022-06-20: 125 ug/kg/min via INTRAVENOUS

## 2022-06-20 SURGICAL SUPPLY — 22 items
ELECT REM PT RETURN 9FT ADLT (ELECTROSURGICAL)
ELECTRODE REM PT RTRN 9FT ADLT (ELECTROSURGICAL) IMPLANT
FCP BXJMBJMB 240X2.8X (CUTTING FORCEPS)
FLOOR PAD 36X40 (MISCELLANEOUS) ×3
FORCEPS BIOP RAD 4 LRG CAP 4 (CUTTING FORCEPS) IMPLANT
FORCEPS BIOP RJ4 240 W/NDL (CUTTING FORCEPS)
FORCEPS BXJMBJMB 240X2.8X (CUTTING FORCEPS) IMPLANT
INJECTOR/SNARE I SNARE (MISCELLANEOUS) IMPLANT
LUBRICANT JELLY 4.5OZ STERILE (MISCELLANEOUS) IMPLANT
MANIFOLD NEPTUNE II (INSTRUMENTS) IMPLANT
NDL SCLEROTHERAPY 25GX240 (NEEDLE) IMPLANT
NEEDLE SCLEROTHERAPY 25GX240 (NEEDLE) IMPLANT
PAD FLOOR 36X40 (MISCELLANEOUS) ×3 IMPLANT
PROBE APC STR FIRE (PROBE) IMPLANT
PROBE INJECTION GOLD (MISCELLANEOUS)
PROBE INJECTION GOLD 7FR (MISCELLANEOUS) IMPLANT
SNARE ROTATE MED OVAL 20MM (MISCELLANEOUS) IMPLANT
SYR 50ML LL SCALE MARK (SYRINGE) IMPLANT
TRAP SPECIMEN MUCOUS 40CC (MISCELLANEOUS) IMPLANT
TUBING ENDO SMARTCAP PENTAX (MISCELLANEOUS) IMPLANT
TUBING IRRIGATION ENDOGATOR (MISCELLANEOUS) ×3 IMPLANT
WATER STERILE IRR 1000ML POUR (IV SOLUTION) IMPLANT

## 2022-06-20 NOTE — Transfer of Care (Signed)
Immediate Anesthesia Transfer of Care Note  Patient: Melissa Middleton  Procedure(s) Performed: COLONOSCOPY WITH PROPOFOL BIOPSY  Patient Location: PACU  Anesthesia Type:MAC  Level of Consciousness: awake, alert  and oriented  Airway & Oxygen Therapy: Patient Spontanous Breathing and Patient connected to face mask oxygen  Post-op Assessment: Report given to RN, Post -op Vital signs reviewed and stable and Patient moving all extremities X 4  Post vital signs: Reviewed and stable  Last Vitals:  Vitals Value Taken Time  BP 104/42   Temp    Pulse 98 06/20/22 1437  Resp 21 06/20/22 1437  SpO2 100 % 06/20/22 1437  Vitals shown include unvalidated device data.  Last Pain:  Vitals:   06/20/22 1312  TempSrc: Temporal  PainSc: 0-No pain         Complications: No notable events documented.

## 2022-06-20 NOTE — Progress Notes (Addendum)
Patient came back from colonoscopy procedure, rectal tube/FMS was removed to facilitate procedure. Patient was asking if rectal tube/FMS would go back in.  Explained to patient that it may be a good idea to have rectal tube/FMS remain out as it may cause rectal tone to decrease. Patient agreeable to have rectal tube remain out to see how Lomotil helps with diarrhea.

## 2022-06-20 NOTE — Telephone Encounter (Signed)
Please schedule pt with Dr. Nani Ravens on follow up from hospital. I think she is still there.

## 2022-06-20 NOTE — Progress Notes (Signed)
Chaplain offered prayer to [place] and her friend at the bedside.  Karoline shared about her healthcare journey.  Chaplain offered listening, presence, and support.    06/20/22 1600  Clinical Encounter Type  Visited With Patient and family together  Visit Type Initial;Spiritual support  Spiritual Encounters  Spiritual Needs Prayer

## 2022-06-20 NOTE — Progress Notes (Signed)
PROGRESS NOTE    Melissa Middleton  IDP:[id number] DOB: [birth year] DOA: 06/17/2022 PCP: Shelda Pal, DO   Brief Narrative:  66 year old F with PMH of GERD, anxiety, depression, insomnia, Nissen fundoplication, seasonal allergies, osteoarthritis, and psoriasis on methotrexate presented with nausea, vomiting, fever with worsening diarrhea.  On presentation, she was febrile to 100.5, tachycardic. CT abdomen and pelvis with nonspecific misty appearance of root of the abdominal mesentery which could be seen in the setting of mesenteric adenitis, colonic diverticulosis without diverticulitis and moderate to large liquid stool burden without evidence of enteric obstruction.  She was started on IV fluids.  Assessment & Plan:   Possible acute gastroenteritis presenting with nausea/vomiting/diarrhea and fever -Still having significant diarrhea and has a rectal tube currently. -CT of abdomen/pelvis as above -Stool testing negative for C. difficile and GI PCR.  DC'd Zithromax. -Currently has a rectal tube with significant output: Switch Imodium to around-the-clock.  Add Lomotil as needed.  Consult GI today -Zofran as needed  Anxiety/depression/insomnia -Continue trazodone and Xanax as needed.  Hypokalemia -Resolved  Hypophosphatemia -Mild  Non anion gap metabolic acidosis -Due to diarrhea.  Still acidotic.  Switch IV fluids to bicarb drip.  Encourage oral intake.  History of psoriasis -Methotrexate on hold.  Outpatient follow-up with rheumatology.  DVT prophylaxis: Lovenox Code Status: Full Family Communication: None at bedside Disposition Plan: Status is:  inpatient because: Because of persistent diarrhea.  Need for IV fluids.    Consultants: Consult GI Procedures: None Antimicrobials:  Anti-infectives (From admission, onward)    Start     Dose/Rate Route Frequency Ordered Stop   06/18/22 1700  azithromycin (ZITHROMAX) tablet 500 mg  Status:  Discontinued        500 mg  Oral Daily 06/18/22 0857 06/19/22 0935   06/17/22 1715  ciprofloxacin (CIPRO) tablet 500 mg        500 mg Oral  Once 06/17/22 1714 06/17/22 1721        Subjective: Patient seen and examined at bedside.  Still having intermittent abdominal pain with significant diarrhea.  Had slight fever last night.  No vomiting, chest pain, shortness of breath.  Feels extremely anxious. Objective: Vitals:   06/19/22 2120 06/19/22 2228 06/20/22 0500 06/20/22 0634  BP: 109/63   (!) 151/101  Pulse: 87   (!) 101  Resp:      Temp: 99.4 F (37.4 C) (!) 100.4 F (38 C)  98.5 F (36.9 C)  TempSrc: Oral Oral  Oral  SpO2: 100%   100%  Weight:   82.6 kg     Intake/Output Summary (Last 24 hours) at 06/20/2022 0729 Last data filed at 06/20/2022 0700 Gross per 24 hour  Intake 1979.82 ml  Output 4050 ml  Net -2070.18 ml    Filed Weights   06/20/22 0500  Weight: 82.6 kg    Examination:  General: On room air.  No distress ENT/neck: No thyromegaly.  JVD is not elevated  respiratory: Decreased breath sounds at bases bilaterally with some crackles; no wheezing  CVS: S1-S2 heard, rate controlled currently Abdominal: Soft, nontender, slightly distended; no organomegaly, bowel sounds are heard.  Rectal tube present. Extremities: Trace lower extremity edema; no cyanosis  CNS: Awake and alert.  No focal neurologic deficit.  Moves extremities Lymph: No obvious lymphadenopathy Skin: No obvious ecchymosis/lesions  psych: Intermittently gets anxious.  Currently not agitated. musculoskeletal: No obvious joint swelling/deformity    Data Reviewed: I have personally reviewed following labs and imaging studies  CBC: Recent Labs  Lab 06/15/22 1655 06/17/22 1250 06/18/22 0556 06/20/22 0513  WBC 7.7 6.5 6.6 10.9*  NEUTROABS 5.4 5.2  --  7.1  HGB 13.0 13.6 12.7 13.5  HCT 39.6 40.7 38.4 42.1  MCV 91.8 90.8 92.8 95.5  PLT 265.0 230 230 428    Basic Metabolic Panel: Recent Labs  Lab 06/17/22 1250  06/18/22 0556 06/18/22 0757 06/18/22 1633 06/19/22 0603 06/20/22 0513  NA 136  --  141 140 140 139  K 3.6  --  2.9* 3.2* 3.7 4.1  CL 108  --  114* 114* 117* 117*  CO2 19*  --  19* 19* 17* 16*  GLUCOSE 163*  --  110* 98 89 93  BUN 14  --  9 8 6* <5*  CREATININE 0.89 0.72 0.80 0.83 0.73 0.76  CALCIUM 8.9  --  8.1* 8.2* 8.0* 8.2*  MG  --   --  1.7  --  2.3 1.9  PHOS  --   --  2.6 2.1* 1.7*  --     GFR: Estimated Creatinine Clearance: 73.4 mL/min (by C-G formula based on SCr of 0.76 mg/dL). Liver Function Tests: Recent Labs  Lab 06/15/22 1655 06/17/22 1250 06/18/22 0757 06/18/22 1633  AST 14 16 11*  --   ALT '13 12 9  '$ --   ALKPHOS 65 68 45  --   BILITOT 0.6 0.6 0.6  --   PROT 7.2 7.0 5.6*  --   ALBUMIN 4.2 3.7 2.9* 3.0*    Recent Labs  Lab 06/17/22 1250  LIPASE 21    No results for input(s): "AMMONIA" in the last 168 hours. Coagulation Profile: No results for input(s): "INR", "PROTIME" in the last 168 hours. Cardiac Enzymes: No results for input(s): "CKTOTAL", "CKMB", "CKMBINDEX", "TROPONINI" in the last 168 hours. BNP (last 3 results) No results for input(s): "PROBNP" in the last 8760 hours. HbA1C: No results for input(s): "HGBA1C" in the last 72 hours. CBG: No results for input(s): "GLUCAP" in the last 168 hours. Lipid Profile: No results for input(s): "CHOL", "HDL", "LDLCALC", "TRIG", "CHOLHDL", "LDLDIRECT" in the last 72 hours. Thyroid Function Tests: No results for input(s): "TSH", "T4TOTAL", "FREET4", "T3FREE", "THYROIDAB" in the last 72 hours. Anemia Panel: No results for input(s): "VITAMINB12", "FOLATE", "FERRITIN", "TIBC", "IRON", "RETICCTPCT" in the last 72 hours. Sepsis Labs: Recent Labs  Lab 06/17/22 1323  LATICACIDVEN 1.8     Recent Results (from the past 240 hour(s))  Stool Culture     Status: None (Preliminary result)   Collection Time: 06/17/22  8:08 AM   Specimen: Stool   Stool  Result Value Ref Range Status   Salmonella/Shigella  Screen Final report  Final   Stool Culture result 1 (RSASHR) Comment  Final    Comment: No Salmonella or Shigella recovered.   Campylobacter Culture Preliminary report  Preliminary   Stool Culture result 1 (CMPCXR) Comment  Preliminary    Comment: Specimen has been received and testing has been initiated.   E coli, Shiga toxin Assay WILL FOLLOW  Preliminary  Blood culture (routine x 2)     Status: None (Preliminary result)   Collection Time: 06/17/22 12:49 PM   Specimen: Right Antecubital; Blood  Result Value Ref Range Status   Specimen Description   Final    RIGHT ANTECUBITAL BLOOD Performed at [facility], [address] [place], [place] [address]    Special Requests   Final    Blood Culture adequate volume BOTTLES DRAWN AEROBIC AND ANAEROBIC Performed  at [facility], [address] [place], [place] [address]    Culture   Final    NO GROWTH 2 DAYS Performed at [facility], [place] [address], [place], [place] [address]    Report Status PENDING  Incomplete  Blood culture (routine x 2)     Status: None (Preliminary result)   Collection Time: 06/17/22  2:01 PM   Specimen: Left Antecubital; Blood  Result Value Ref Range Status   Specimen Description   Final    LEFT ANTECUBITAL BLOOD Performed at [facility], [address] [place], [place] [address]    Special Requests   Final    Blood Culture adequate volume BOTTLES DRAWN AEROBIC AND ANAEROBIC Performed at [facility], [address] [place], [place] [address]    Culture   Final    NO GROWTH 2 DAYS Performed at [facility], [place] [address] [place], [place] [address]    Report Status PENDING  Incomplete  Gastrointestinal Panel by PCR , Stool     Status: None   Collection Time: 06/17/22  5:01 PM   Specimen: Stool  Result Value Ref Range Status   Campylobacter species NOT DETECTED NOT DETECTED Final   Plesimonas shigelloides NOT DETECTED NOT DETECTED Final    Salmonella species NOT DETECTED NOT DETECTED Final   Yersinia enterocolitica NOT DETECTED NOT DETECTED Final   Vibrio species NOT DETECTED NOT DETECTED Final   Vibrio cholerae NOT DETECTED NOT DETECTED Final   Enteroaggregative E coli (EAEC) NOT DETECTED NOT DETECTED Final   Enteropathogenic E coli (EPEC) NOT DETECTED NOT DETECTED Final   Enterotoxigenic E coli (ETEC) NOT DETECTED NOT DETECTED Final   Shiga like toxin producing E coli (STEC) NOT DETECTED NOT DETECTED Final   Shigella/Enteroinvasive E coli (EIEC) NOT DETECTED NOT DETECTED Final   Cryptosporidium NOT DETECTED NOT DETECTED Final   Cyclospora cayetanensis NOT DETECTED NOT DETECTED Final   Entamoeba histolytica NOT DETECTED NOT DETECTED Final   Giardia lamblia NOT DETECTED NOT DETECTED Final   Adenovirus F40/41 NOT DETECTED NOT DETECTED Final   Astrovirus NOT DETECTED NOT DETECTED Final   Norovirus GI/GII NOT DETECTED NOT DETECTED Final   Rotavirus A NOT DETECTED NOT DETECTED Final   Sapovirus (I, II, IV, and V) NOT DETECTED NOT DETECTED Final    Comment: Performed at [facility], [address] [place], [place] [address]  C Difficile Quick Screen w PCR reflex     Status: None   Collection Time: 06/18/22  3:42 AM   Specimen: STOOL  Result Value Ref Range Status   C Diff antigen NEGATIVE NEGATIVE Final   C Diff toxin NEGATIVE NEGATIVE Final   C Diff interpretation No C. difficile detected.  Final    Comment: Performed at [facility], [place] [address], [place],  [address]         Radiology Studies: No results found.      Scheduled Meds:  enoxaparin (LOVENOX) injection  40 mg Subcutaneous Daily   montelukast  10 mg Oral QHS   pantoprazole  40 mg Oral Daily   traZODone  100 mg Oral QHS   Continuous Infusions:  0.9 % NaCl with KCl 20 mEq / L 125 mL/hr at 06/20/22 0537          Aline August, MD [facility] 06/20/2022, 7:29 AM  '

## 2022-06-20 NOTE — Anesthesia Procedure Notes (Signed)
Procedure Name: MAC Date/Time: 06/20/2022 2:05 PM  Performed by: Maxwell Caul, CRNAPre-anesthesia Checklist: Patient identified, Emergency Drugs available, Suction available and Patient being monitored Patient Re-evaluated:Patient Re-evaluated prior to induction Oxygen Delivery Method: Simple face mask

## 2022-06-20 NOTE — Anesthesia Preprocedure Evaluation (Addendum)
Anesthesia Evaluation  Patient identified by MRN, date of birth, ID band Patient awake    Reviewed: Allergy & Precautions, NPO status , Patient's Chart, lab work & pertinent test results  History of Anesthesia Complications (+) PONV and history of anesthetic complications  Airway Mallampati: II  TM Distance: >3 FB Neck ROM: Full    Dental no notable dental hx. (+) Dental Advisory Given   Pulmonary shortness of breath, asthma , pneumonia,    Pulmonary exam normal breath sounds clear to auscultation       Cardiovascular negative cardio ROS Normal cardiovascular exam Rhythm:Regular Rate:Normal     Neuro/Psych PSYCHIATRIC DISORDERS Anxiety Depression  Neuromuscular disease    GI/Hepatic Neg liver ROS, hiatal hernia, GERD  ,  Endo/Other  negative endocrine ROS  Renal/GU negative Renal ROS     Musculoskeletal  (+) Arthritis ,   Abdominal   Peds  Hematology  (+) Blood dyscrasia, anemia ,   Anesthesia Other Findings   Reproductive/Obstetrics                            Lab Results  Component Value Date   WBC 10.9 (H) 06/20/2022   HGB 13.5 06/20/2022   HCT 42.1 06/20/2022   MCV 95.5 06/20/2022   PLT 265 06/20/2022   Lab Results  Component Value Date   CREATININE 0.76 06/20/2022   BUN <5 (L) 06/20/2022   NA 139 06/20/2022   K 4.1 06/20/2022   CL 117 (H) 06/20/2022   CO2 16 (L) 06/20/2022    Anesthesia Physical  Anesthesia Plan  ASA: 2  Anesthesia Plan: MAC   Post-op Pain Management: Minimal or no pain anticipated   Induction: Inhalational  PONV Risk Score and Plan: 3 and Ondansetron, Treatment may vary due to age or medical condition and Propofol infusion  Airway Management Planned:   Additional Equipment: None  Intra-op Plan:   Post-operative Plan:   Informed Consent: I have reviewed the patients History and Physical, chart, labs and discussed the procedure including  the risks, benefits and alternatives for the proposed anesthesia with the patient or authorized representative who has indicated his/her understanding and acceptance.     Dental advisory given  Plan Discussed with: CRNA  Anesthesia Plan Comments:       Anesthesia Quick Evaluation

## 2022-06-20 NOTE — Op Note (Signed)
[facility] Patient Name: Melissa Middleton Procedure Date: 06/20/2022 MRN: [id number] Attending MD: Carlota Raspberry. Havery Moros , MD Date of Birth: 1956-05-23 CSN: [id number] Age: 66 Admit Type: Inpatient Procedure:                Colonoscopy Indications:              Clinically significant diarrhea of unexplained                            origin - acute, negative infectious workup, on                            methotrexate and Symponi for psoriatic arthritis.                            Persistent loose stools. Of note patient did not                            tolerate bowel prep - exam done mostly unprepped. Providers:                Carlota Raspberry. Havery Moros, MD, Dulcy Fanny, Despina Pole, Technician, Virgia Land, CRNA Referring MD:              Medicines:                Monitored Anesthesia Care Complications:            No immediate complications. Estimated blood loss:                            Minimal. Estimated Blood Loss:     Estimated blood loss was minimal. Procedure:                Pre-Anesthesia Assessment:                           - Prior to the procedure, a History and Physical                            was performed, and patient medications and                            allergies were reviewed. The patient's tolerance of                            previous anesthesia was also reviewed. The risks                            and benefits of the procedure and the sedation                            options and risks were discussed with the patient.  All questions were answered, and informed consent                            was obtained. Prior Anticoagulants: The patient has                            taken no previous anticoagulant or antiplatelet                            agents. ASA Grade Assessment: II - A patient with                            mild systemic disease. After reviewing the risks                             and benefits, the patient was deemed in                            satisfactory condition to undergo the procedure.                           After obtaining informed consent, the colonoscope                            was passed under direct vision. Throughout the                            procedure, the patient's blood pressure, pulse, and                            oxygen saturations were monitored continuously. The                            PCF-HQ190L ([id number]) Olympus colonoscope was                            introduced through the anus and advanced to the the                            terminal ileum, with identification of the                            appendiceal orifice and IC valve. The colonoscopy                            was performed without difficulty. The patient                            tolerated the procedure well. The quality of the                            bowel preparation was fair. The terminal ileum,  ileocecal valve, appendiceal orifice, and rectum                            were photographed. Scope In: 2:15:23 PM Scope Out: 2:31:55 PM Scope Withdrawal Time: 0 hours 9 minutes 37 seconds  Total Procedure Duration: 0 hours 16 minutes 32 seconds  Findings:      The perianal and digital rectal examinations were normal.      The terminal ileum appeared normal.      Multiple small-mouthed diverticula were found in the left colon and       right colon.      The prep was much better than expected however residual stool was       throughout the colon which was lavaged. Cecal cap could not be cleared.       The exam was otherwise without abnormality. No inflammatory changes. Of       note, retroflexed views not obtained given small size of the rectum.      Biopsies for histology were taken with a cold forceps from the right       colon, left colon and transverse colon for evaluation of microscopic        colitis. Impression:               - Preparation of the colon was fair, residual stool                            in the rectum                           - The examined portion of the ileum was normal.                           - Diverticulosis in the left colon and in the right                            colon.                           - The examination was otherwise normal.                           - Biopsies were taken with a cold forceps from the                            right colon, left colon and transverse colon for                            evaluation of microscopic colitis. Moderate Sedation:      No moderate sedation, case performed with MAC Recommendation:           - Return patient to hospital ward for ongoing care.                           - Advance diet as tolerated.                           - Continue present medications.                           -  Await pathology results.                           - Start scheduled lomotil for diarrhea, Zofran for                            nausea                           - Inpatient service will follow Procedure Code(s):        --- Professional ---                           [phone], Colonoscopy, flexible; with biopsy, single                            or multiple Diagnosis Code(s):        --- Professional ---                           R19.7, Diarrhea, unspecified                           K57.30, Diverticulosis of large intestine without                            perforation or abscess without bleeding CPT copyright 2019 [employer]. All rights reserved. The codes documented in this report are preliminary and upon coder review may  be revised to meet current compliance requirements. Remo Lipps P. Havery Moros, MD 06/20/2022 2:41:42 PM This report has been signed electronically. Number of Addenda: 0

## 2022-06-20 NOTE — Consult Note (Signed)
Consultation  Referring Provider: Dr. Starla Link     Primary Care Physician:  Shelda Pal, DO Primary Gastroenterologist: Dr. Loletha Carrow        Reason for Consultation: Severe diarrhea             HPI:   Melissa Middleton is a 66 y.o. female with a past medical history as listed below including reflux, anxiety, depression, previous Nissen fundoplication, psoriasis on methotrexate and others, who presented to the hospital on 06/17/2022 for intractable nausea vomiting diarrhea and fever.    06/15/2022 patient saw PCP for diarrhea and at that time had stool panels including culture, O&P and C. difficile which were ordered but were unable to be collected.  She reached out to her rheumatologist and was advised to stop her Methotrexate.    When patient arrived to the ED febrile at 100.5, tachycardic, UA with many bacteria and a CT of the abdomen pelvis with nonspecific misty appearance of the root of the abdominal mesentery which could be seen in the setting of mesenteric adenitis, colonic diverticulosis without diverticulitis and moderate to large liquid stool burden without evidence of enteric obstruction.  At that time C. difficile, gastrointestinal pathogen panel, blood cultures, stool ova and parasite were ordered and she was started on Cipro.    Today, patient explains that she started with very frequent, pretty much continuous watery bowel movements about 9 days ago.  Apparently the night before had eaten some pizza at a restaurant and gone to a country concert and then everything started that evening and just has never stopped.  Tells me that at home she was wearing some diapers for a while due to the frequent bowel movements and then started to develop some nausea and vomiting as well as a low-grade fever and presented to the ER.  Since being in the hospital patient tells me that nothing has changed at all.  She is quite frustrated that she has been here for 3 days now and really nothing has  changed with her symptoms.  She tells me she has upped her Xanax because all of this is making her very anxious.  She is unable to get out of the hospital bed really at all due to her rectal tube which was placed a day ago and her IV.  She is getting very frustrated.  Her family is on the phone at time of my interview and tell me that if she is not going to have a colonoscopy in the coming days they are going to move her to a different hospital.  Associated symptoms include continued nausea.     Denies abdominal pain, further vomiting, new diet, sick contacts or other symptoms.  GI history: 01/13/2021 colonoscopy: Diverticula in the left and right colon, otherwise normal, repeat recommended in 10 years 01/13/2021 EGD: Large hiatal hernia, erosive gastropathy with no bleeding and no stigmata of bleeding, Cameron erosions which were likely to explain patient's IDA and heme positive stool and multiple fundic gland polyps, discussed referral to surgeon for hiatal hernia repair  Past Medical History:  Diagnosis Date   Allergic rhinitis    Anemia    Anxiety    Asthma    Benign neoplasm of stomach    gastric polyps   Diaphragmatic hernia without mention of obstruction or gangrene    Diverticulosis of colon (without mention of hemorrhage)    Esophageal reflux    see GI   GERD (gastroesophageal reflux disease)    Hiatal  hernia    Insomnia    Osteoarthritis    sees ortho-has had steroid shot in knee   Pneumonia    PONV (postoperative nausea and vomiting)    Post menopausal problems    symptoms   Premenstrual tension syndromes    gyn uses alprazolam and spironolactone prn ofr treatment   Primary hyperparathyroidism ([place])    Psoriasis    Psoriatic arthritis ([place])    Vitamin D deficiency     Past Surgical History:  Procedure Laterality Date   CESAREAN SECTION     ENDOMETRIAL ABLATION     INSERTION OF MESH N/A 05/04/2021   Procedure: INSERTION OF MESH;  Surgeon: Ralene Ok, MD;   Location: [facility] OR;  Service: General;  Laterality: N/A;   NASAL SINUS SURGERY     TOTAL KNEE ARTHROPLASTY Right 11/2009   TUBAL LIGATION  2004   with ablation   XI ROBOTIC ASSISTED HIATAL HERNIA REPAIR N/A 05/04/2021   Procedure: XI ROBOTIC ASSISTED HIATAL HERNIA REPAIR WITH MESH AND FUNDOPLICATION;  Surgeon: Ralene Ok, MD;  Location: [place];  Service: General;  Laterality: N/A;    Family History  Problem Relation Age of Onset   Hypertension Mother        died from an asthma attack   Asthma Mother    Diabetes Father        died from sepsis   Hypertension Father    Arthritis Father    Ovarian cancer Sister    Melanoma Sister    Hypertension Maternal Grandfather    Heart attack Maternal Grandfather    Diabetes Paternal Grandmother    Arthritis Paternal Grandmother    Breast cancer Cousin    Migraines Neg Hx    Colon cancer Neg Hx    Esophageal cancer Neg Hx    Rectal cancer Neg Hx    Stomach cancer Neg Hx     Social History   Tobacco Use   Smoking status: Never   Smokeless tobacco: Never  Vaping Use   Vaping Use: Never used  Substance Use Topics   Alcohol use: No    Alcohol/week: 0.0 standard drinks of alcohol   Drug use: No    Prior to Admission medications   Medication Sig Start Date End Date Taking? Authorizing Provider  albuterol (VENTOLIN HFA) 108 (90 Base) MCG/ACT inhaler Inhale 2 puffs into the lungs every 4 (four) hours as needed for wheezing or shortness of breath. 01/10/20  Yes Garnet Sierras, DO  ALPRAZolam (XANAX) 0.25 MG tablet TAKE 1 TABLET AT BEDTIME AS NEEDED FOR ANXIETY Patient taking differently: Take by mouth at bedtime as needed for anxiety. TAKE 1 TABLET AT BEDTIME AS NEEDED FOR ANXIETY 05/23/22  Yes Marny Lowenstein A, NP  Ascorbic Acid (VITAMIN C PO) Take 1 tablet by mouth daily.   Yes [provider]  B Complex Vitamins (VITAMIN B COMPLEX PO) Take 1 tablet by mouth daily.   Yes [provider]  celecoxib (CELEBREX) 200 MG  capsule Take 200 mg by mouth as needed for mild pain.   Yes [provider]  Cholecalciferol (VITAMIN D) 50 MCG (2000 UT) CAPS Take 4,000 Units by mouth daily.   Yes [provider]  EPINEPHrine (EPIPEN 2-PAK) 0.3 mg/0.3 mL IJ SOAJ injection Use as directed for severe allergic reactions Patient taking differently: Inject 0.3 mg into the muscle as needed for anaphylaxis. 11/12/18  Yes Debbrah Alar, NP  Estradiol (VAGIFEM) 10 MCG TABS vaginal tablet USE VAGINALLY 2 TIMES PER  [place] Patient taking differently: Place 10 mcg vaginally. USE VAGINALLY 2 TIMES PER WK 05/23/22  Yes Juleen China, Tiffany A, NP  fluticasone (FLONASE) 50 MCG/ACT nasal spray TWO SPRAYS EACH NOSTRIL ONCE A DAY FOR NASAL CONGESTION OR DRAINAGE. Patient taking differently: Place into both nostrils daily as needed for allergies. 01/05/16  Yes Bardelas, Jens Som, MD  folic acid (FOLVITE) 1 MG tablet Take 1 mg by mouth daily. 04/07/21  Yes [provider]  levocetirizine (XYZAL) 5 MG tablet TAKE 1 TABLET BY MOUTH EVERY DAY IN THE EVENING Patient taking differently: Take 5 mg by mouth every evening. 05/02/22  Yes Shelda Pal, DO  methotrexate (RHEUMATREX) 2.5 MG tablet Take 15 mg by mouth once a week. 12/31/19  Yes [provider]  montelukast (SINGULAIR) 10 MG tablet TAKE 1 TABLET BY MOUTH EVERYDAY AT BEDTIME Patient taking differently: Take 10 mg by mouth at bedtime. 11/29/21  Yes Shelda Pal, DO  Multiple Vitamin (MULTIVITAMIN WITH MINERALS) TABS tablet Take 1 tablet by mouth daily.   Yes [provider]  nystatin (MYCOSTATIN/NYSTOP) powder Apply 1 application topically 3 (three) times daily. 11/30/21  Yes Marny Lowenstein A, NP  SIMPONI ARIA 50 MG/4ML SOLN injection Inject 50 mg into the vein every 8 (eight) weeks. 10/29/19  Yes [provider]  spironolactone (ALDACTONE) 25 MG tablet TAKE 1 TABLET (25 MG TOTAL) BY MOUTH DAILY AS NEEDED. 10/17/19  Yes Huel Cote, NP  traZODone (DESYREL) 100 MG tablet Take 1-1.5 tablets (100-150 mg total) by mouth at bedtime. 12/29/21  Yes Shelda Pal, DO  zinc gluconate 50 MG tablet Take 50 mg by mouth daily.   Yes [provider]  meloxicam (MOBIC) 7.5 MG tablet TAKE 1 TABLET BY MOUTH EVERY DAY AS NEEDED FOR ARTHRITIS Patient not taking: Reported on 06/18/2022 07/12/21   Shelda Pal, DO  methocarbamol (ROBAXIN) 500 MG tablet TAKE 1 TABLET BY MOUTH EVERY DAY AT BEDTIME AS NEEDED Patient not taking: Reported on 06/18/2022 09/10/18   [provider]    Current Facility-Administered Medications  Medication Dose Route Frequency Provider Last Rate Last Admin   0.9 % NaCl with KCl 20 mEq/ L  infusion   Intravenous Continuous Mercy Riding, MD 125 mL/hr at 06/20/22 0537 New Bag at 06/20/22 0537   acetaminophen (TYLENOL) tablet 650 mg  650 mg Oral Q6H PRN Lang Snow, NP   650 mg at 06/19/22 2321   albuterol (PROVENTIL) (2.5 MG/3ML) 0.083% nebulizer solution 2.5 mg  2.5 mg Inhalation Q6H PRN Mercy Riding, MD       ALPRAZolam Duanne Moron) tablet 0.25 mg  0.25 mg Oral TID PRN Aline August, MD   0.25 mg at 06/20/22 0325   enoxaparin (LOVENOX) injection 40 mg  40 mg Subcutaneous Daily Hall, Carole N, DO   40 mg at 06/19/22 1115   loperamide (IMODIUM) capsule 2 mg  2 mg Oral Q6H PRN Aline August, MD   2 mg at 06/20/22 0325   montelukast (SINGULAIR) tablet 10 mg  10 mg Oral QHS Wendee Beavers T, MD   10 mg at 06/19/22 2224   ondansetron (ZOFRAN) injection 4 mg  4 mg Intravenous Q6H PRN Wendee Beavers T, MD   4 mg at 06/20/22 0325   Oral care mouth rinse  15 mL Mouth Rinse PRN Hall, Carole N, DO       pantoprazole (PROTONIX) EC tablet 40 mg  40 mg Oral Daily Mercy Riding, MD   40  mg at 06/19/22 1114   traZODone (DESYREL) tablet 100 mg  100 mg Oral QHS Wendee Beavers T, MD   100 mg at 06/19/22 2223    Allergies as of 06/17/2022 - Review Complete 06/17/2022  Allergen Reaction Noted    Macrodantin [nitrofurantoin macrocrystal] Itching and Rash 04/25/2013   Cashew nut (anacardium occidentale) skin test Rash 11/30/2021   Cefuroxime axetil Rash and Other (See Comments) 09/29/2009   Flavoring agent Itching and Rash 10/20/2019   Peanut-containing drug products Rash 11/30/2021   Penicillins Rash 05/03/2010   Shellfish allergy Itching and Rash 11/12/2018     Review of Systems:    Constitutional: No weight loss, fever or chills Skin: No rash  Cardiovascular: No chest pain  Respiratory: No SOB  Gastrointestinal: See HPI and otherwise negative Genitourinary: No dysuria Neurological: No headache, dizziness or syncope Musculoskeletal: No new muscle or joint pain Hematologic: No bleeding  Psychiatric: +anxiety   Physical Exam:  Vital signs in last 24 hours: Temp:  [98.5 F (36.9 C)-100.4 F (38 C)] 98.5 F (36.9 C) (07/03 0634) Pulse Rate:  [82-101] 101 (07/03 0634) Resp:  [16] 16 (07/02 1435) BP: (107-151)/(57-101) 151/101 (07/03 0634) SpO2:  [100 %] 100 % (07/03 0634) Weight:  [82.6 kg] 82.6 kg (07/03 0500) Last BM Date : 06/19/22 General:   Pleasant Caucasian female appears to be in NAD, Well developed, Well nourished, alert and cooperative Head:  Normocephalic and atraumatic. Eyes:   PEERL, EOMI. No icterus. Conjunctiva pink. Ears:  Normal auditory acuity. Neck:  Supple Throat: Oral cavity and pharynx without inflammation, swelling or lesion.  Lungs: Respirations even and unlabored. Lungs clear to auscultation bilaterally.   No wheezes, crackles, or rhonchi.  Heart: Normal S1, S2. No MRG. Regular rate and rhythm. No peripheral edema, cyanosis or pallor.  Abdomen:  Soft, nondistended, nontender. No rebound or guarding. Normal bowel sounds. No appreciable masses or hepatomegaly. Rectal:  +rectal tube with liquid/clear stool and small fragments  Msk:  Symmetrical without gross deformities. Peripheral pulses intact.  Extremities:  Without edema, no deformity or  joint abnormality.  Neurologic:  Alert and  oriented x4;  grossly normal neurologically.  Skin:   Dry and intact without significant lesions or rashes. Psychiatric: Demonstrates good judgement and reason without abnormal affect or behaviors.  LAB RESULTS: Recent Labs    06/17/22 1250 06/18/22 0556 06/20/22 0513  WBC 6.5 6.6 10.9*  HGB 13.6 12.7 13.5  HCT 40.7 38.4 42.1  PLT 230 230 265   BMET Recent Labs    06/18/22 1633 06/19/22 0603 06/20/22 0513  NA 140 140 139  K 3.2* 3.7 4.1  CL 114* 117* 117*  CO2 19* 17* 16*  GLUCOSE 98 89 93  BUN 8 6* <5*  CREATININE 0.83 0.73 0.76  CALCIUM 8.2* 8.0* 8.2*   LFT Recent Labs    06/18/22 0757 06/18/22 1633  PROT 5.6*  --   ALBUMIN 2.9* 3.0*  AST 11*  --   ALT 9  --   ALKPHOS 45  --   BILITOT 0.6  --      Impression / Plan:   Impression: 1.  Diarrhea: CTAP with nonspecific misty appearance of the root of the abdominal mesentery, C. difficile negative, Gi path panel negative, history of psoriasis on Methotrexate; Consider viral vs microscopic colitis vs other 2.  Nausea and vomiting: Continue with some nausea, likely with above 3.  History of Nissen fundoplication 4.  History of psoriasis: Methotrexate on hold  Plan: 1.  Ordered fecal calprotectin 2.  Ordered iTTG and IgA to consider celiac disease for tomorrow morning 3.  Patient is scheduled for colonoscopy later this afternoon at 2:00.  She will drink at least half of a movi prep by then.  Discussed this with her and her nurse.  She will need to be n.p.o. by noon. 4.  Also recommend holding Lovenox dose this morning.  Did discuss this with the nursing staff. 5.  Patient will likely need scheduled Lomotil in the future while results are pending from colonoscopy.  She has not had scheduled Imodium or Lomotil up to this point.  Could also consider adding Colestid. 6.  Please await further recommendations from Dr. Havery Moros after time of colonoscopy this  afternoon.  Thank you for your kind consultation, we will continue to follow.  Lavone Nian [facility]  06/20/2022, 8:38 AM

## 2022-06-20 NOTE — Telephone Encounter (Signed)
Nurse Assessment Nurse: Alba Destine, RN, Melony Overly Date/Time Eilene Ghazi Time): 06/19/2022 6:19:13 AM Confirm and document reason for call. If symptomatic, describe symptoms. ---Caller stated she is in the hospital and they are not helping. Was seen in practice on Thursday and Dr. Harvie Heck is aware that she went to [facility] on Friday. She was there all day in the ED. They transported her to [place] early on Saturday morning. She is having constant diarrhea. They have said that all her labs are fine and they can't find a reason for the diarrhea. 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 has been sick for a week . uncontrollable. no blood. low grade temp recently no temp now. no vomiting. Does the patient have any new or worsening symptoms? ---Yes Will a triage be completed? ---Yes Related visit to physician within the last 2 weeks? ---Yes Does the PT have any chronic conditions? (i.e. diabetes, asthma, this includes High risk factors for pregnancy, etc.) ---Yes List chronic conditions. ---auto immune disease Is this a behavioral health or substance abuse call? ---No PLEASE NOTE: All timestamps contained within this report are represented as [place] Standard Time. CONFIDENTIALTY NOTICE: This fax transmission is intended only for the addressee. It contains information that is legally privileged, confidential or otherwise protected from use or disclosure. If you are not the intended recipient, you are strictly prohibited from reviewing, disclosing, copying using or disseminating any of this information or taking any action in reliance on or regarding this information. If you have received this fax in error, please notify us immediately by telephone so that we can arrange for its return to [place]. Phone: [phone], Toll-Free: [phone], Fax: [phone] Page: 2 of 2 Call Id: [id number] Guidelines Guideline Title Affirmed Question Affirmed Notes Nurse Date/Time Eilene Ghazi Time) Diarrhea [1]  SEVERE diarrhea (e.g., 7 or more times / day more than normal) AND [2] age > 41 years Alwyn Pea 06/19/2022 6:25:50 AM Disp. Time Eilene Ghazi Time) Disposition Final User 06/19/2022 6:34:32 AM See HCP within 4 Hours (or PCP triage) Yes Alba Destine, RN, Melony Overly Final Disposition 06/19/2022 6:34:32 AM See HCP within 4 Hours (or PCP triage) Yes Alba Destine, RN, Melony Overly Caller Disagree/Comply Comply Caller Understands Yes PreDisposition Go to ED Care Advice Given Per Guideline SEE HCP (OR PCP TRIAGE) WITHIN 4 HOURS: * IF OFFICE WILL BE CLOSED AND NO PCP (PRIMARY CARE PROVIDER) SECOND-LEVEL TRIAGE: You need to be seen within the next 3 or 4 hours. A nearby Urgent Care Center [facility]) is often a good source of care. Another choice is to go to the ED. Go sooner if you become worse. CARE ADVICE given per Diarrhea (Adult) guideline. CLEAR FLUIDS: * Drink more fluids. * Sip water or a half-strength sports drink (e.g., Gatorade, Powerade; mix half and half with water). CALL BACK IF: * You become worse Referrals [facility] - ED

## 2022-06-20 NOTE — Anesthesia Postprocedure Evaluation (Signed)
Anesthesia Post Note  Patient: Melissa Middleton  Procedure(s) Performed: COLONOSCOPY WITH PROPOFOL BIOPSY     Patient location during evaluation: PACU Anesthesia Type: MAC Level of consciousness: awake and alert Pain management: pain level controlled Vital Signs Assessment: post-procedure vital signs reviewed and stable Respiratory status: spontaneous breathing Cardiovascular status: stable Anesthetic complications: no   No notable events documented.  Last Vitals:  Vitals:   06/20/22 1502 06/20/22 1540  BP: 127/62 99/74  Pulse: 91 92  Resp: 17 17  Temp:  36.8 C  SpO2: 100% 100%    Last Pain:  Vitals:   06/20/22 1540  TempSrc: Oral  PainSc:                  Nolon Nations

## 2022-06-20 NOTE — Telephone Encounter (Signed)
Patient is still admitted I sent PCP her after hours note.

## 2022-06-20 NOTE — Telephone Encounter (Signed)
Admitted currently

## 2022-06-21 DIAGNOSIS — R159 Full incontinence of feces: Secondary | ICD-10-CM

## 2022-06-21 DIAGNOSIS — E872 Acidosis, unspecified: Secondary | ICD-10-CM | POA: Diagnosis not present

## 2022-06-21 DIAGNOSIS — R197 Diarrhea, unspecified: Secondary | ICD-10-CM | POA: Diagnosis not present

## 2022-06-21 DIAGNOSIS — R112 Nausea with vomiting, unspecified: Secondary | ICD-10-CM | POA: Diagnosis not present

## 2022-06-21 DIAGNOSIS — K529 Noninfective gastroenteritis and colitis, unspecified: Secondary | ICD-10-CM | POA: Diagnosis not present

## 2022-06-21 LAB — MAGNESIUM: Magnesium: 1.9 mg/dL (ref 1.7–2.4)

## 2022-06-21 LAB — BASIC METABOLIC PANEL
Anion gap: 7 (ref 5–15)
BUN: <5 mg/dL — ABNORMAL LOW (ref 8–23)
CO2: 22 mmol/L (ref 22–32)
Calcium: 8.3 mg/dL — ABNORMAL LOW (ref 8.9–10.3)
Chloride: 111 mmol/L (ref 98–111)
Creatinine, Ser: 0.75 mg/dL (ref 0.44–1.00)
GFR, Estimated: >60 mL/min (ref >60)
Glucose, Bld: 118 mg/dL — ABNORMAL HIGH (ref 70–99)
Potassium: 2.8 mmol/L — ABNORMAL LOW (ref 3.5–5.1)
Sodium: 140 mmol/L (ref 135–145)

## 2022-06-21 LAB — STOOL CULTURE: E coli, Shiga toxin Assay: NEGATIVE

## 2022-06-21 MED ORDER — LORATADINE 10 MG PO TABS
10.0000 mg | ORAL_TABLET | Freq: Every day | ORAL | Status: DC
  Administered 2022-06-21 – 2022-06-28 (×8): 10 mg via ORAL
  Filled 2022-06-21 (×8): qty 1

## 2022-06-21 MED ORDER — POTASSIUM CHLORIDE CRYS ER 20 MEQ PO TBCR
40.0000 meq | EXTENDED_RELEASE_TABLET | ORAL | Status: AC
  Administered 2022-06-21 (×2): 40 meq via ORAL
  Filled 2022-06-21 (×2): qty 2

## 2022-06-21 MED ORDER — COLESTIPOL HCL 1 G PO TABS
1.0000 g | ORAL_TABLET | Freq: Two times a day (BID) | ORAL | Status: DC
  Administered 2022-06-21 (×2): 1 g via ORAL
  Filled 2022-06-21 (×3): qty 1

## 2022-06-21 MED ORDER — DIPHENOXYLATE-ATROPINE 2.5-0.025 MG PO TABS
1.0000 | ORAL_TABLET | ORAL | Status: DC
  Administered 2022-06-21 – 2022-06-28 (×40): 1 via ORAL
  Filled 2022-06-21 (×41): qty 1

## 2022-06-21 MED ORDER — POTASSIUM CHLORIDE IN NACL 40-0.9 MEQ/L-% IV SOLN
INTRAVENOUS | Status: DC
  Filled 2022-06-21 (×3): qty 1000

## 2022-06-21 NOTE — Progress Notes (Signed)
      Progress Note   Subjective  Patient feeling better. Diarrhea slowing down with lomotil, but still has urgency and having a hard time getting to the bathroom in time.    Objective   Vital signs in last 24 hours: Temp:  [98.2 F (36.8 C)-98.7 F (37.1 C)] 98.7 F (37.1 C) (07/04 0552) Pulse Rate:  [91-114] 101 (07/03 2050) Resp:  [15-23] 16 (07/04 0552) BP: (89-127)/(37-74) 103/57 (07/04 0552) SpO2:  [98 %-100 %] 98 % (07/04 0552) Weight:  [81.4 kg] 81.4 kg (07/04 0500) Last BM Date : 06/20/22 General:    white female in NAD Neurologic:  Alert and oriented,  grossly normal neurologically. Psych:  Cooperative. Normal mood and affect.  Intake/Output from previous day: 07/03 0701 - 07/04 0700 In: 3016.7 [P.O.:240; I.V.:2776.7] Out: 800 [Stool:800] Intake/Output this shift: No intake/output data recorded.  Lab Results: Recent Labs    06/20/22 0513  WBC 10.9*  HGB 13.5  HCT 42.1  PLT 265   BMET Recent Labs    06/19/22 0603 06/20/22 0513 06/21/22 0507  NA 140 139 140  K 3.7 4.1 2.8*  CL 117* 117* 111  CO2 17* 16* 22  GLUCOSE 89 93 118*  BUN 6* <5* <5*  CREATININE 0.73 0.76 0.75  CALCIUM 8.0* 8.2* 8.3*   LFT Recent Labs    06/18/22 1633  ALBUMIN 3.0*   PT/INR No results for input(s): "LABPROT", "INR" in the last 72 hours.  Studies/Results: No results found.     Assessment / Plan:   66 y/o female on Simponi and MTX for psoriatic arthrtitis who presented to the hospital with severe persistent diarrhea. Now ongoing >10 days. Mild fever in the ED. CT scan showing some misty appearance of the root of the mesentery. C diff , GI pathogen panel, stool ova / parasites negative. She denied any recent new meds or sick contacts. Her diarrhea is severe and persistent, has not been able to get out of the hospital.    Colonoscopy yesterday, 7/3, was normal but biopsies taken to rule out microscopic colitis. In the interim started on scheduled lomotil and she is  feeling better today, but not resolved and still with increased frequency and urgency. Discussed options with her - plan on increasing lomotil frequency and adding colestid / cholestyramine. Biopsies should hopefully be back tomorrow or day after.  Plan: - increase lomotil to every 4 hours scheduled - adding colestid 1gm BID. If she cannot tolerate the capsules, can give cholestyramine powder, but she would prefer pills if possible - follow up path results - we will reassess her tomorrow, call with questions.  Jolly Mango, MD [facility] Gastroenterology

## 2022-06-21 NOTE — Progress Notes (Signed)
  Transition of Care [facility]) Screening Note   Patient Details  Name: MARIBELLA KUNA Date of Birth: [birth year]   Transition of Care [facility]) CM/SW Contact:    Vassie Moselle, LCSW Phone Number: 06/21/2022, 8:55 AM    Transition of Care Department [facility]) has reviewed patient and no TOC needs have been identified at this time. We will continue to monitor patient advancement through interdisciplinary progression rounds. If new patient transition needs arise, please place a TOC consult.

## 2022-06-21 NOTE — Progress Notes (Signed)
PROGRESS NOTE    DESARIE FEILD  HKV:[id number] DOB: [birth year] DOA: 06/17/2022 PCP: Shelda Pal, DO   Brief Narrative:  66 year old F with PMH of GERD, anxiety, depression, insomnia, Nissen fundoplication, seasonal allergies, osteoarthritis, and psoriasis on methotrexate presented with nausea, vomiting, fever with worsening diarrhea.  On presentation, she was febrile to 100.5, tachycardic. CT abdomen and pelvis with nonspecific misty appearance of root of the abdominal mesentery which could be seen in the setting of mesenteric adenitis, colonic diverticulosis without diverticulitis and moderate to large liquid stool burden without evidence of enteric obstruction.  She was started on IV fluids.  GI was subsequently consulted and she underwent colonoscopy on 06/20/2022.  Assessment & Plan:   Possible acute gastroenteritis presenting with nausea/vomiting/diarrhea and fever -Still having significant diarrhea and has a rectal tube currently. -CT of abdomen/pelvis as above -Stool testing negative for C. difficile and GI PCR.   -GI was consulted on 06/20/2022 because of significant diarrhea.  She underwent colonoscopy on 06/20/2022 to evaluate for microscopic colitis: Showed diverticulosis in the left colon and the right colon.  Pathology pending -Currently on Lomotil.  Still having Diarrhea but Improving.  Colestipol being added by GI.  Follow GI recommendations. -Advance diet to full liquid diet.  Anxiety/depression/insomnia -Continue trazodone and Xanax as needed.  Hypokalemia -Replace.  Repeat a.m. labs.  Hypophosphatemia -Mild  Non anion gap metabolic acidosis -Due to diarrhea.  Currently on bicarb drip.  Acidosis has resolved.  DC bicarb drip.  Encourage oral intake.  History of psoriasis -Methotrexate on hold.  Outpatient follow-up with rheumatology.  DVT prophylaxis: Lovenox Code Status: Full Family Communication: None at bedside Disposition Plan: Status is:  inpatient  because: Because of persistent diarrhea.     Consultants: GI  procedures: Colonoscopy on 06/20/2022  antimicrobials:  Anti-infectives (From admission, onward)    Start     Dose/Rate Route Frequency Ordered Stop   06/18/22 1700  azithromycin (ZITHROMAX) tablet 500 mg  Status:  Discontinued        500 mg Oral Daily 06/18/22 0857 06/19/22 0935   06/17/22 1715  ciprofloxacin (CIPRO) tablet 500 mg        500 mg Oral  Once 06/17/22 1714 06/17/22 1721        Subjective: Patient seen and examined at bedside.  Diarrhea is improving but still significant.  Appetite is still poor.  Denies any fever or vomiting. objective: Vitals:   06/20/22 1540 06/20/22 2050 06/21/22 0500 06/21/22 0552  BP: 99/74 125/72  (!) 103/57  Pulse: 92 (!) 101    Resp: '17 18  16  '$ Temp: 98.3 F (36.8 C) 98.5 F (36.9 C)  98.7 F (37.1 C)  TempSrc: Oral   Oral  SpO2: 100% 99%  98%  Weight:   81.4 kg     Intake/Output Summary (Last 24 hours) at 06/21/2022 1132 Last data filed at 06/21/2022 0500 Gross per 24 hour  Intake 2550.69 ml  Output --  Net 2550.69 ml    Filed Weights   06/20/22 0500 06/21/22 0500  Weight: 82.6 kg 81.4 kg    Examination:  General: No acute distress.  Currently on room air. respiratory: Bilateral decreased breath sounds at bases bilaterally with scattered crackles CVS: Currently rate controlled; S1-S2 heard  abdominal: Soft, nontender, distended mildly, no organomegaly; bowel sounds are heard  extremities: No cyanosis; mild lower extremity edema present    Data Reviewed: I have personally reviewed following labs and imaging studies  CBC: Recent Labs  Lab  06/15/22 1655 06/17/22 1250 06/18/22 0556 06/20/22 0513  WBC 7.7 6.5 6.6 10.9*  NEUTROABS 5.4 5.2  --  7.1  HGB 13.0 13.6 12.7 13.5  HCT 39.6 40.7 38.4 42.1  MCV 91.8 90.8 92.8 95.5  PLT 265.0 230 230 250    Basic Metabolic Panel: Recent Labs  Lab 06/18/22 0757 06/18/22 1633 06/19/22 0603 06/20/22 0513  06/21/22 0507  NA 141 140 140 139 140  K 2.9* 3.2* 3.7 4.1 2.8*  CL 114* 114* 117* 117* 111  CO2 19* 19* 17* 16* 22  GLUCOSE 110* 98 89 93 118*  BUN 9 8 6* <5* <5*  CREATININE 0.80 0.83 0.73 0.76 0.75  CALCIUM 8.1* 8.2* 8.0* 8.2* 8.3*  MG 1.7  --  2.3 1.9 1.9  PHOS 2.6 2.1* 1.7*  --   --     GFR: Estimated Creatinine Clearance: 72.9 mL/min (by C-G formula based on SCr of 0.75 mg/dL). Liver Function Tests: Recent Labs  Lab 06/15/22 1655 06/17/22 1250 06/18/22 0757 06/18/22 1633  AST 14 16 11*  --   ALT '13 12 9  '$ --   ALKPHOS 65 68 45  --   BILITOT 0.6 0.6 0.6  --   PROT 7.2 7.0 5.6*  --   ALBUMIN 4.2 3.7 2.9* 3.0*    Recent Labs  Lab 06/17/22 1250  LIPASE 21    No results for input(s): "AMMONIA" in the last 168 hours. Coagulation Profile: No results for input(s): "INR", "PROTIME" in the last 168 hours. Cardiac Enzymes: No results for input(s): "CKTOTAL", "CKMB", "CKMBINDEX", "TROPONINI" in the last 168 hours. BNP (last 3 results) No results for input(s): "PROBNP" in the last 8760 hours. HbA1C: No results for input(s): "HGBA1C" in the last 72 hours. CBG: No results for input(s): "GLUCAP" in the last 168 hours. Lipid Profile: No results for input(s): "CHOL", "HDL", "LDLCALC", "TRIG", "CHOLHDL", "LDLDIRECT" in the last 72 hours. Thyroid Function Tests: No results for input(s): "TSH", "T4TOTAL", "FREET4", "T3FREE", "THYROIDAB" in the last 72 hours. Anemia Panel: No results for input(s): "VITAMINB12", "FOLATE", "FERRITIN", "TIBC", "IRON", "RETICCTPCT" in the last 72 hours. Sepsis Labs: Recent Labs  Lab 06/17/22 1323  LATICACIDVEN 1.8     Recent Results (from the past 240 hour(s))  Clostridium Difficile by PCR     Status: None   Collection Time: 06/17/22  8:08 AM   Specimen: Stool   Stool  Result Value Ref Range Status   Toxigenic C. Difficile by PCR Negative Negative Final  Stool Culture     Status: None (Preliminary result)   Collection Time: 06/17/22   8:08 AM   Specimen: Stool   Stool  Result Value Ref Range Status   Salmonella/Shigella Screen Final report  Final   Stool Culture result 1 (RSASHR) Comment  Final    Comment: No Salmonella or Shigella recovered.   Campylobacter Culture Preliminary report  Preliminary   Stool Culture result 1 (CMPCXR) Comment  Preliminary    Comment: Culture has been reviewed and is still in progress.   E coli, Shiga toxin Assay Negative Negative Final  Blood culture (routine x 2)     Status: None (Preliminary result)   Collection Time: 06/17/22 12:49 PM   Specimen: Right Antecubital; Blood  Result Value Ref Range Status   Specimen Description   Final    RIGHT ANTECUBITAL BLOOD Performed at [facility], [address], [place], [place] [address]    Special Requests   Final    Blood Culture  adequate volume BOTTLES DRAWN AEROBIC AND ANAEROBIC Performed at [facility], [address] [place], [place] [address]    Culture   Final    NO GROWTH 4 DAYS Performed at [facility], [place] [address] [place], [place] [address]    Report Status PENDING  Incomplete  Blood culture (routine x 2)     Status: None (Preliminary result)   Collection Time: 06/17/22  2:01 PM   Specimen: Left Antecubital; Blood  Result Value Ref Range Status   Specimen Description   Final    LEFT ANTECUBITAL BLOOD Performed at [facility], [address] [place], [place] [address]    Special Requests   Final    Blood Culture adequate volume BOTTLES DRAWN AEROBIC AND ANAEROBIC Performed at [facility], [address], [place], [place] [address]    Culture   Final    NO GROWTH 4 DAYS Performed at [facility], [place] [address] [place],  [address]    Report Status PENDING  Incomplete  Gastrointestinal Panel by PCR , Stool     Status: None   Collection Time: 06/17/22  5:01 PM   Specimen: Stool  Result Value Ref Range Status   Campylobacter species NOT DETECTED  NOT DETECTED Final   Plesimonas shigelloides NOT DETECTED NOT DETECTED Final   Salmonella species NOT DETECTED NOT DETECTED Final   Yersinia enterocolitica NOT DETECTED NOT DETECTED Final   Vibrio species NOT DETECTED NOT DETECTED Final   Vibrio cholerae NOT DETECTED NOT DETECTED Final   Enteroaggregative E coli (EAEC) NOT DETECTED NOT DETECTED Final   Enteropathogenic E coli (EPEC) NOT DETECTED NOT DETECTED Final   Enterotoxigenic E coli (ETEC) NOT DETECTED NOT DETECTED Final   Shiga like toxin producing E coli (STEC) NOT DETECTED NOT DETECTED Final   Shigella/Enteroinvasive E coli (EIEC) NOT DETECTED NOT DETECTED Final   Cryptosporidium NOT DETECTED NOT DETECTED Final   Cyclospora cayetanensis NOT DETECTED NOT DETECTED Final   Entamoeba histolytica NOT DETECTED NOT DETECTED Final   Giardia lamblia NOT DETECTED NOT DETECTED Final   Adenovirus F40/41 NOT DETECTED NOT DETECTED Final   Astrovirus NOT DETECTED NOT DETECTED Final   Norovirus GI/GII NOT DETECTED NOT DETECTED Final   Rotavirus A NOT DETECTED NOT DETECTED Final   Sapovirus (I, II, IV, and V) NOT DETECTED NOT DETECTED Final    Comment: Performed at [facility], [address] [place], [place] [address]  C Difficile Quick Screen w PCR reflex     Status: None   Collection Time: 06/18/22  3:42 AM   Specimen: STOOL  Result Value Ref Range Status   C Diff antigen NEGATIVE NEGATIVE Final   C Diff toxin NEGATIVE NEGATIVE Final   C Diff interpretation No C. difficile detected.  Final    Comment: Performed at [facility], [place] [address] [place],  [address]         Radiology Studies: No results found.      Scheduled Meds:  colestipol  1 g Oral BID   diphenoxylate-atropine  1 tablet Oral Q4H   enoxaparin (LOVENOX) injection  40 mg Subcutaneous Daily   loratadine  10 mg Oral Daily   montelukast  10 mg Oral QHS   pantoprazole  40 mg Oral Daily   potassium chloride  40 mEq Oral Q4H    traZODone  100 mg Oral QHS   Continuous Infusions:  sodium bicarbonate 150  mEq in dextrose 5 % 1,150 mL infusion 100 mL/hr at 06/21/22 9150          Aline August, MD [facility] 06/21/2022, 11:32 AM  '

## 2022-06-22 ENCOUNTER — Encounter (HOSPITAL_COMMUNITY): Payer: Self-pay | Admitting: Gastroenterology

## 2022-06-22 DIAGNOSIS — R197 Diarrhea, unspecified: Secondary | ICD-10-CM | POA: Diagnosis not present

## 2022-06-22 DIAGNOSIS — R159 Full incontinence of feces: Secondary | ICD-10-CM | POA: Diagnosis not present

## 2022-06-22 DIAGNOSIS — R112 Nausea with vomiting, unspecified: Secondary | ICD-10-CM | POA: Diagnosis not present

## 2022-06-22 DIAGNOSIS — F419 Anxiety disorder, unspecified: Secondary | ICD-10-CM | POA: Diagnosis not present

## 2022-06-22 DIAGNOSIS — K529 Noninfective gastroenteritis and colitis, unspecified: Secondary | ICD-10-CM | POA: Diagnosis not present

## 2022-06-22 LAB — BASIC METABOLIC PANEL
Anion gap: 5 (ref 5–15)
BUN: <5 mg/dL — ABNORMAL LOW (ref 8–23)
CO2: 16 mmol/L — ABNORMAL LOW (ref 22–32)
Calcium: 8.6 mg/dL — ABNORMAL LOW (ref 8.9–10.3)
Chloride: 118 mmol/L — ABNORMAL HIGH (ref 98–111)
Creatinine, Ser: 0.72 mg/dL (ref 0.44–1.00)
GFR, Estimated: >60 mL/min (ref >60)
Glucose, Bld: 108 mg/dL — ABNORMAL HIGH (ref 70–99)
Potassium: 4.1 mmol/L (ref 3.5–5.1)
Sodium: 139 mmol/L (ref 135–145)

## 2022-06-22 LAB — URINALYSIS, ROUTINE W REFLEX MICROSCOPIC
Bilirubin Urine: NEGATIVE
Glucose, UA: NEGATIVE mg/dL
Hgb urine dipstick: NEGATIVE
Ketones, ur: NEGATIVE mg/dL
Nitrite: NEGATIVE
Protein, ur: NEGATIVE mg/dL
Specific Gravity, Urine: 1.015 (ref 1.005–1.030)
pH: 5 (ref 5.0–8.0)

## 2022-06-22 LAB — CBC WITH DIFFERENTIAL/PLATELET
Abs Immature Granulocytes: 1.62 10*3/uL — ABNORMAL HIGH (ref 0.00–0.07)
Basophils Absolute: 0 10*3/uL (ref 0.0–0.1)
Basophils Relative: 0 %
Eosinophils Absolute: 1 10*3/uL — ABNORMAL HIGH (ref 0.0–0.5)
Eosinophils Relative: 7 %
HCT: 39.6 % (ref 36.0–46.0)
Hemoglobin: 13 g/dL (ref 12.0–15.0)
Immature Granulocytes: 11 %
Lymphocytes Relative: 23 %
Lymphs Abs: 3.3 10*3/uL (ref 0.7–4.0)
MCH: 30.6 pg (ref 26.0–34.0)
MCHC: 32.8 g/dL (ref 30.0–36.0)
MCV: 93.2 fL (ref 80.0–100.0)
Monocytes Absolute: 1.5 10*3/uL — ABNORMAL HIGH (ref 0.1–1.0)
Monocytes Relative: 10 %
Neutro Abs: 7 10*3/uL (ref 1.7–7.7)
Neutrophils Relative %: 49 %
Platelets: 233 10*3/uL (ref 150–400)
RBC: 4.25 MIL/uL (ref 3.87–5.11)
RDW: 14.7 % (ref 11.5–15.5)
WBC: 14.5 10*3/uL — ABNORMAL HIGH (ref 4.0–10.5)
nRBC: 0 % (ref 0.0–0.2)

## 2022-06-22 LAB — MAGNESIUM
Magnesium: 1.9 mg/dL (ref 1.7–2.4)
Magnesium: 1.9 mg/dL (ref 1.7–2.4)

## 2022-06-22 LAB — PHOSPHORUS: Phosphorus: 2.6 mg/dL (ref 2.5–4.6)

## 2022-06-22 LAB — CULTURE, BLOOD (ROUTINE X 2)
Culture: NO GROWTH
Culture: NO GROWTH
Special Requests: ADEQUATE
Special Requests: ADEQUATE

## 2022-06-22 LAB — GLIA (IGA/G) + TTG IGA
Antigliadin Abs, IgA: 5 units (ref 0–19)
Gliadin IgG: 3 units (ref 0–19)
Tissue Transglutaminase Ab, IgA: <2 U/mL (ref 0–3)

## 2022-06-22 LAB — SURGICAL PATHOLOGY

## 2022-06-22 MED ORDER — SODIUM BICARBONATE 8.4 % IV SOLN
INTRAVENOUS | Status: DC
  Filled 2022-06-22 (×2): qty 150
  Filled 2022-06-22: qty 1000

## 2022-06-22 MED ORDER — COLESTIPOL HCL 1 G PO TABS
2.0000 g | ORAL_TABLET | ORAL | Status: DC
  Administered 2022-06-22 – 2022-06-28 (×13): 2 g via ORAL
  Filled 2022-06-22 (×14): qty 2

## 2022-06-22 NOTE — Progress Notes (Addendum)
Progress Note   Subjective  Chief Complaint: Diarrhea  S/P colonoscopy 06/20/22 with pathology results pending  Today, the patient tells me that she is growing very frustrated with no real answers and only minimal improvement of symptoms.  Tells me that she thinks things may have slowed just a tiny bit but the second that she gets off of the bedside commode she lays down and almost immediately has another accident or stool.  She does think it may be has slightly more formed since starting the Colestid yesterday.  Tells me that if the biopsies do not really show anything then she is thinking about going to another hospital to get some more answers.  Apparently nursing staff are also pressing her to get in and out of the bed when she has to have a bowel movement and she tells me she is too weak to do that.   Objective   Vital signs in last 24 hours: Temp:  [97.8 F (36.6 C)-98.7 F (37.1 C)] 98.7 F (37.1 C) (07/05 0448) Pulse Rate:  [90-99] 99 (07/05 0448) Resp:  [16-18] 18 (07/05 0448) BP: (95-139)/(75-77) 139/76 (07/05 0448) SpO2:  [99 %] 99 % (07/05 0448) Weight:  [80.5 kg] 80.5 kg (07/05 0500) Last BM Date : 06/21/22 General:    white female in NAD Heart:  Regular rate and rhythm; no murmurs Lungs: Respirations even and unlabored, lungs CTA bilaterally Abdomen:  Soft, nontender and nondistended. Normal bowel sounds. Psych:  Cooperative. Normal mood and affect.  Intake/Output from previous day: 07/04 0701 - 07/05 0700 In: 3673.7 [P.O.:2040; I.V.:1633.7] Out: -   Lab Results: Recent Labs    06/20/22 0513 06/22/22 0816  WBC 10.9* 14.5*  HGB 13.5 13.0  HCT 42.1 39.6  PLT 265 233   BMET Recent Labs    06/20/22 0513 06/21/22 0507 06/22/22 0520  NA 139 140 139  K 4.1 2.8* 4.1  CL 117* 111 118*  CO2 16* 22 16*  GLUCOSE 93 118* 108*  BUN <5* <5* <5*  CREATININE 0.76 0.75 0.72  CALCIUM 8.2* 8.3* 8.6*     Assessment / Plan:   Assessment: 1.  Severe persistent  diarrhea: Ongoing greater than 11 days, mild fever in the ED, CT with misty appearance of the root of the mesentery, C. difficile, GI pathogen panel, O&P negative, no recent new meds or sick contacts, colonoscopy 7/3 was normal, biopsies done to rule out microscopic colitis, minimal improvement on Lomotil every 4 hours and addition of Colestid 1 g twice daily yesterday 2.  Psoriatic arthritis: On Simponi and methotrexate, methotrexate on hold  Plan: 1.  Did call and get a preliminary path report, there is some increase in lymphocytes but nothing that is pathognomonic for lymphocytic colitis.  We will await their complete results later today. 2.  Continue Lomotil every 4 hours scheduled 3.  Increased Colestid to 2 G BID 4.  Patient is very frustrated.  She told me that she is thinking about leaving here and going to another hospital. 5.  Discussed patient's situation with nursing staff and explained that she is too weak to get in and out of the bed on her own and they will need to do more frequent checks to get her cleaned up so that she does not develop UTI and skin breakdown.  Thank you for your kind consultation, we will continue to follow while she is in the hospital.   LOS: 3 days   Levin Erp  06/22/2022, 9:31  AM

## 2022-06-22 NOTE — Progress Notes (Signed)
PROGRESS NOTE    Melissa Middleton  QQV:[id number] DOB: 1956-02-03 DOA: 06/17/2022 PCP: Shelda Pal, DO     Brief Narrative:  66 year old WF PMHx GERD, anxiety, depression, insomnia, Nissen fundoplication, seasonal allergies, osteoarthritis, and psoriasis on methotrexate   Presented with nausea, vomiting, fever with worsening diarrhea.  On presentation, she was febrile to 100.5, tachycardic. CT abdomen and pelvis with nonspecific misty appearance of root of the abdominal mesentery which could be seen in the setting of mesenteric adenitis, colonic diverticulosis without diverticulitis and moderate to large liquid stool burden without evidence of enteric obstruction.  She was started on IV fluids.  GI was subsequently consulted and she underwent colonoscopy on 06/20/2022.   Subjective: Afebrile overnight, A/O x4.  Patient unsure why all of her studies, labs are not run stat.  States her stools are beginning to firm up.  Having some dysuria.   Assessment & Plan: Covid vaccination;   Principal Problem:   Intractable nausea and vomiting Active Problems:   INSOMNIA, CHRONIC   Anxiety and depression   Gastroesophageal reflux disease without esophagitis   Moderate persistent asthma   OAB (overactive bladder)   Psoriatic arthritis (HCC)   S/P Nissen fundoplication (without gastrostomy tube) procedure   Diarrhea   Gastroenteritis   Hypokalemia   Metabolic acidosis   Acute gastroenteritis   Incontinence of feces  Possible acute gastroenteritis presenting with nausea/vomiting/diarrhea and fever -Still having significant diarrhea and has a rectal tube currently. -Stool testing negative for C. difficile and GI PCR.   -GI was consulted on 06/20/2022 because of significant diarrhea.  She underwent colonoscopy on 06/20/2022 to evaluate for microscopic colitis: Showed diverticulosis in the left colon and the right colon.   -7/5 discussed case with Dr. Zachery Conch GI documents patient  pathology NOT fully consistent with microscopic colitis   -7/5 patient's WBC increased overnight.Dr. Zachery Conch GI may order CT enterography if patient is negative for UTI. -Colestid 2 g daily - Lomotil every 4 hours -7/5 TSH pending  Non anion gap metabolic acidosis -Due to diarrhea.   -7/5 sodium bicarb 75 ml/hr   Anxiety/depression/insomnia -Xanax 0.25 mg TID -Trazodone 100 mg qhs.   Hypokalemia -Potassium goal> 4 - Resolved.   Hypophosphatemia -Phosphorus goal> 2.5   History of psoriasis -Methotrexate on hold.  Outpatient follow-up with rheumatology.      Mobility Assessment (last 72 hours)     Mobility Assessment     Row Name 06/20/22 0900           Does patient have an order for bedrest or is patient medically unstable No - Continue assessment       What is the highest level of mobility based on the progressive mobility assessment? Level 5 (Walks with assist in room/hall) - Balance while stepping forward/back and can walk in room with assist - Complete                      DVT prophylaxis: Lovenox Code Status: Full Family Communication: 7/5 son at bedside for discussion of plan of care all questions answered Status is: Inpatient    Dispo: The patient is from: Home              Anticipated d/c is to: Home              Anticipated d/c date is: 2 days              Patient currently is not medically stable to d/c.  Consultants:  GI  Procedures/Significant Events:    I have personally reviewed and interpreted all radiology studies and my findings are as above.  VENTILATOR SETTINGS:    Cultures 6/30 GI panel negative 7/1 C. difficile antigen negative, C. difficile toxin negative   Antimicrobials: Anti-infectives (From admission, onward)    Start     Ordered Stop   06/18/22 1700  azithromycin (ZITHROMAX) tablet 500 mg  Status:  Discontinued        06/18/22 0857 06/19/22 0935   06/17/22 1715  ciprofloxacin (CIPRO) tablet 500  mg        06/17/22 1714 06/17/22 1721         Devices    LINES / TUBES:      Continuous Infusions:  0.9 % NaCl with KCl 40 mEq / L 75 mL/hr at 06/22/22 0121     Objective: Vitals:   06/21/22 1438 06/21/22 2006 06/22/22 0448 06/22/22 0500  BP: 95/77 124/75 139/76   Pulse: 94 90 99   Resp: '16 18 18   '$ Temp: 98.1 F (36.7 C) 97.8 F (36.6 C) 98.7 F (37.1 C)   TempSrc: Oral Oral Oral   SpO2: 99% 99% 99%   Weight:    80.5 kg    Intake/Output Summary (Last 24 hours) at 06/22/2022 0800 Last data filed at 06/22/2022 0600 Gross per 24 hour  Intake 3673.69 ml  Output --  Net 3673.69 ml   Filed Weights   06/20/22 0500 06/21/22 0500 06/22/22 0500  Weight: 82.6 kg 81.4 kg 80.5 kg    Examination:  General: A/O x4, No acute respiratory distress Eyes: negative scleral hemorrhage, negative anisocoria, negative icterus ENT: Negative Runny nose, negative gingival bleeding, Neck:  Negative scars, masses, torticollis, lymphadenopathy, JVD Lungs: Clear to auscultation bilaterally without wheezes or crackles Cardiovascular: Regular rate and rhythm without murmur gallop or rub normal S1 and S2 Abdomen: negative abdominal pain, nondistended, positive soft, bowel sounds, no rebound, no ascites, no appreciable mass, positive right CVA tenderness Extremities: No significant cyanosis, clubbing, or edema bilateral lower extremities Skin: Negative rashes, lesions, ulcers Psychiatric:  Negative depression, positive anxiety, negative fatigue, negative mania  Central nervous system:  Cranial nerves II through XII intact, tongue/uvula midline, all extremities muscle strength 5/5, sensation intact throughout, negative dysarthria, negative expressive aphasia, negative receptive aphasia.  .     Data Reviewed: Care during the described time interval was provided by me .  I have reviewed this patient's available data, including medical history, events of note, physical examination, and all test  results as part of my evaluation.  CBC: Recent Labs  Lab 06/15/22 1655 06/17/22 1250 06/18/22 0556 06/20/22 0513  WBC 7.7 6.5 6.6 10.9*  NEUTROABS 5.4 5.2  --  7.1  HGB 13.0 13.6 12.7 13.5  HCT 39.6 40.7 38.4 42.1  MCV 91.8 90.8 92.8 95.5  PLT 265.0 230 230 324   Basic Metabolic Panel: Recent Labs  Lab 06/18/22 0757 06/18/22 1633 06/19/22 0603 06/20/22 0513 06/21/22 0507 06/22/22 0520  NA 141 140 140 139 140 139  K 2.9* 3.2* 3.7 4.1 2.8* 4.1  CL 114* 114* 117* 117* 111 118*  CO2 19* 19* 17* 16* 22 16*  GLUCOSE 110* 98 89 93 118* 108*  BUN 9 8 6* <5* <5* <5*  CREATININE 0.80 0.83 0.73 0.76 0.75 0.72  CALCIUM 8.1* 8.2* 8.0* 8.2* 8.3* 8.6*  MG 1.7  --  2.3 1.9 1.9 1.9  PHOS 2.6 2.1* 1.7*  --   --   --  GFR: Estimated Creatinine Clearance: 72.5 mL/min (by C-G formula based on SCr of 0.72 mg/dL). Liver Function Tests: Recent Labs  Lab 06/15/22 1655 06/17/22 1250 06/18/22 0757 06/18/22 1633  AST 14 16 11*  --   ALT '13 12 9  '$ --   ALKPHOS 65 68 45  --   BILITOT 0.6 0.6 0.6  --   PROT 7.2 7.0 5.6*  --   ALBUMIN 4.2 3.7 2.9* 3.0*   Recent Labs  Lab 06/17/22 1250  LIPASE 21   No results for input(s): "AMMONIA" in the last 168 hours. Coagulation Profile: No results for input(s): "INR", "PROTIME" in the last 168 hours. Cardiac Enzymes: No results for input(s): "CKTOTAL", "CKMB", "CKMBINDEX", "TROPONINI" in the last 168 hours. BNP (last 3 results) No results for input(s): "PROBNP" in the last 8760 hours. HbA1C: No results for input(s): "HGBA1C" in the last 72 hours. CBG: No results for input(s): "GLUCAP" in the last 168 hours. Lipid Profile: No results for input(s): "CHOL", "HDL", "LDLCALC", "TRIG", "CHOLHDL", "LDLDIRECT" in the last 72 hours. Thyroid Function Tests: No results for input(s): "TSH", "T4TOTAL", "FREET4", "T3FREE", "THYROIDAB" in the last 72 hours. Anemia Panel: No results for input(s): "VITAMINB12", "FOLATE", "FERRITIN", "TIBC", "IRON",  "RETICCTPCT" in the last 72 hours. Sepsis Labs: Recent Labs  Lab 06/17/22 1323  LATICACIDVEN 1.8    Recent Results (from the past 240 hour(s))  Clostridium Difficile by PCR     Status: None   Collection Time: 06/17/22  8:08 AM   Specimen: Stool   Stool  Result Value Ref Range Status   Toxigenic C. Difficile by PCR Negative Negative Final  Stool Culture     Status: None   Collection Time: 06/17/22  8:08 AM   Specimen: Stool   Stool  Result Value Ref Range Status   Salmonella/Shigella Screen Final report  Final   Stool Culture result 1 (RSASHR) Comment  Final    Comment: No Salmonella or Shigella recovered.   Campylobacter Culture Final report  Final   Stool Culture result 1 (CMPCXR) Comment  Final    Comment: No Campylobacter species isolated.   E coli, Shiga toxin Assay Negative Negative Final  Blood culture (routine x 2)     Status: None (Preliminary result)   Collection Time: 06/17/22 12:49 PM   Specimen: Right Antecubital; Blood  Result Value Ref Range Status   Specimen Description   Final    RIGHT ANTECUBITAL BLOOD Performed at [facility], [address] [place], [place] [address]    Special Requests   Final    Blood Culture adequate volume BOTTLES DRAWN AEROBIC AND ANAEROBIC Performed at [facility], [address] [place], [place] [address]    Culture   Final    NO GROWTH 4 DAYS Performed at [facility], [place] [address], [place], [place] [address]    Report Status PENDING  Incomplete  Blood culture (routine x 2)     Status: None (Preliminary result)   Collection Time: 06/17/22  2:01 PM   Specimen: Left Antecubital; Blood  Result Value Ref Range Status   Specimen Description   Final    LEFT ANTECUBITAL BLOOD Performed at [facility], [address] [place], [place] [address]    Special Requests   Final    Blood Culture adequate volume BOTTLES DRAWN AEROBIC AND ANAEROBIC Performed at [facility], [address] [place], [place] [address]    Culture  Final    NO GROWTH 4 DAYS Performed at [facility], [place] [address] [place], [place] [address]    Report Status PENDING  Incomplete  Gastrointestinal Panel by PCR , Stool     Status: None   Collection Time: 06/17/22  5:01 PM   Specimen: Stool  Result Value Ref Range Status   Campylobacter species NOT DETECTED NOT DETECTED Final   Plesimonas shigelloides NOT DETECTED NOT DETECTED Final   Salmonella species NOT DETECTED NOT DETECTED Final   Yersinia enterocolitica NOT DETECTED NOT DETECTED Final   Vibrio species NOT DETECTED NOT DETECTED Final   Vibrio cholerae NOT DETECTED NOT DETECTED Final   Enteroaggregative E coli (EAEC) NOT DETECTED NOT DETECTED Final   Enteropathogenic E coli (EPEC) NOT DETECTED NOT DETECTED Final   Enterotoxigenic E coli (ETEC) NOT DETECTED NOT DETECTED Final   Shiga like toxin producing E coli (STEC) NOT DETECTED NOT DETECTED Final   Shigella/Enteroinvasive E coli (EIEC) NOT DETECTED NOT DETECTED Final   Cryptosporidium NOT DETECTED NOT DETECTED Final   Cyclospora cayetanensis NOT DETECTED NOT DETECTED Final   Entamoeba histolytica NOT DETECTED NOT DETECTED Final   Giardia lamblia NOT DETECTED NOT DETECTED Final   Adenovirus F40/41 NOT DETECTED NOT DETECTED Final   Astrovirus NOT DETECTED NOT DETECTED Final   Norovirus GI/GII NOT DETECTED NOT DETECTED Final   Rotavirus A NOT DETECTED NOT DETECTED Final   Sapovirus (I, II, IV, and V) NOT DETECTED NOT DETECTED Final    Comment: Performed at [facility], [address] [place], [place] [address]  C Difficile Quick Screen w PCR reflex     Status: None   Collection Time: 06/18/22  3:42 AM   Specimen: STOOL  Result Value Ref Range Status   C Diff antigen NEGATIVE NEGATIVE Final   C Diff toxin NEGATIVE NEGATIVE Final   C Diff interpretation No C. difficile detected.  Final    Comment: Performed at [facility], [place]  [address], [place], [place] [address]         Radiology Studies: No results found.      Scheduled Meds:  colestipol  1 g Oral BID   diphenoxylate-atropine  1 tablet Oral Q4H   enoxaparin (LOVENOX) injection  40 mg Subcutaneous Daily   loratadine  10 mg Oral Daily   montelukast  10 mg Oral QHS   pantoprazole  40 mg Oral Daily   traZODone  100 mg Oral QHS   Continuous Infusions:  0.9 % NaCl with KCl 40 mEq / L 75 mL/hr at 06/22/22 0121     LOS: 3 days    Time spent:40 min    Judithann Villamar, Geraldo Docker, MD [facility]   If 7PM-7AM, please contact night-coverage 06/22/2022, 8:00 AM

## 2022-06-23 ENCOUNTER — Inpatient Hospital Stay (HOSPITAL_COMMUNITY): Payer: 59

## 2022-06-23 DIAGNOSIS — R159 Full incontinence of feces: Secondary | ICD-10-CM | POA: Diagnosis not present

## 2022-06-23 DIAGNOSIS — F419 Anxiety disorder, unspecified: Secondary | ICD-10-CM | POA: Diagnosis not present

## 2022-06-23 DIAGNOSIS — K529 Noninfective gastroenteritis and colitis, unspecified: Secondary | ICD-10-CM | POA: Diagnosis not present

## 2022-06-23 DIAGNOSIS — R197 Diarrhea, unspecified: Secondary | ICD-10-CM | POA: Diagnosis not present

## 2022-06-23 DIAGNOSIS — R112 Nausea with vomiting, unspecified: Secondary | ICD-10-CM | POA: Diagnosis not present

## 2022-06-23 LAB — CBC WITH DIFFERENTIAL/PLATELET
Abs Immature Granulocytes: 1.51 10*3/uL — ABNORMAL HIGH (ref 0.00–0.07)
Basophils Absolute: 0 10*3/uL (ref 0.0–0.1)
Basophils Relative: 0 %
Eosinophils Absolute: 0.5 10*3/uL (ref 0.0–0.5)
Eosinophils Relative: 4 %
HCT: 36.6 % (ref 36.0–46.0)
Hemoglobin: 12.1 g/dL (ref 12.0–15.0)
Immature Granulocytes: 13 %
Lymphocytes Relative: 22 %
Lymphs Abs: 2.6 10*3/uL (ref 0.7–4.0)
MCH: 30.3 pg (ref 26.0–34.0)
MCHC: 33.1 g/dL (ref 30.0–36.0)
MCV: 91.7 fL (ref 80.0–100.0)
Monocytes Absolute: 1.3 10*3/uL — ABNORMAL HIGH (ref 0.1–1.0)
Monocytes Relative: 11 %
Neutro Abs: 6 10*3/uL (ref 1.7–7.7)
Neutrophils Relative %: 50 %
Platelets: 221 10*3/uL (ref 150–400)
RBC: 3.99 MIL/uL (ref 3.87–5.11)
RDW: 14.5 % (ref 11.5–15.5)
WBC: 11.9 10*3/uL — ABNORMAL HIGH (ref 4.0–10.5)
nRBC: 0 % (ref 0.0–0.2)

## 2022-06-23 LAB — OVA AND PARASITE EXAMINATION
CONCENTRATE RESULT:: NONE SEEN
MICRO NUMBER:: 13594287
SPECIMEN QUALITY:: ADEQUATE
TRICHROME RESULT:: NONE SEEN

## 2022-06-23 LAB — COMPREHENSIVE METABOLIC PANEL
ALT: 9 U/L (ref 0–44)
AST: 9 U/L — ABNORMAL LOW (ref 15–41)
Albumin: 2.6 g/dL — ABNORMAL LOW (ref 3.5–5.0)
Alkaline Phosphatase: 57 U/L (ref 38–126)
Anion gap: 7 (ref 5–15)
BUN: <5 mg/dL — ABNORMAL LOW (ref 8–23)
CO2: 28 mmol/L (ref 22–32)
Calcium: 8.3 mg/dL — ABNORMAL LOW (ref 8.9–10.3)
Chloride: 103 mmol/L (ref 98–111)
Creatinine, Ser: 0.64 mg/dL (ref 0.44–1.00)
GFR, Estimated: >60 mL/min (ref >60)
Glucose, Bld: 106 mg/dL — ABNORMAL HIGH (ref 70–99)
Potassium: 3.6 mmol/L (ref 3.5–5.1)
Sodium: 138 mmol/L (ref 135–145)
Total Bilirubin: 0.5 mg/dL (ref 0.3–1.2)
Total Protein: 5.4 g/dL — ABNORMAL LOW (ref 6.5–8.1)

## 2022-06-23 LAB — MAGNESIUM: Magnesium: 1.7 mg/dL (ref 1.7–2.4)

## 2022-06-23 LAB — C-REACTIVE PROTEIN: CRP: 1.9 mg/dL — ABNORMAL HIGH (ref <1.0)

## 2022-06-23 LAB — SEDIMENTATION RATE: Sed Rate: 15 mm/hr (ref 0–22)

## 2022-06-23 LAB — PHOSPHORUS: Phosphorus: 3.5 mg/dL (ref 2.5–4.6)

## 2022-06-23 LAB — TSH: TSH: 1.014 u[IU]/mL (ref 0.350–4.500)

## 2022-06-23 MED ORDER — SODIUM CHLORIDE 0.9 % IV SOLN: INTRAVENOUS | Status: DC

## 2022-06-23 MED ORDER — IOHEXOL 300 MG/ML  SOLN
100.0000 mL | Freq: Once | INTRAMUSCULAR | Status: AC | PRN
  Administered 2022-06-23: 100 mL via INTRAVENOUS

## 2022-06-23 MED ORDER — BARIUM SULFATE 0.1 % PO SUSP
ORAL | Status: AC
  Filled 2022-06-23: qty 3

## 2022-06-23 NOTE — Progress Notes (Signed)
     [facility] Progress Note  CC:  Diarrhea  Subjective:  Says that she is much better today.  Stools less frequent and forming.  Not happen with some of the care here, but she says that she appreciates our assistance.  She wants to do whatever evaluation is needed here to get an answer and resolution to this.  Objective:  Vital signs in last 24 hours: Temp:  [98.4 F (36.9 C)-99 F (37.2 C)] 98.9 F (37.2 C) (07/06 0554) Pulse Rate:  [70-90] 85 (07/06 0554) Resp:  [16-20] 18 (07/06 0554) BP: (116-159)/(65-91) 128/73 (07/06 0554) SpO2:  [99 %-100 %] 100 % (07/06 0554) Weight:  [80.2 kg] 80.2 kg (07/06 0500) Last BM Date : 06/22/22 General:  Alert, Well-developed, in NAD Heart:  Regular rate and rhythm; no murmurs Pulm:  CTAB.  No W/R/R. Abdomen:  Soft, non-distended.  BS present and hyperactive.  Non-tender. Extremities:  Without edema. Neurologic:  Alert and oriented x 4;  grossly normal neurologically. Psych:  Alert and cooperative. Normal mood and affect.  Intake/Output from previous day: 07/05 0701 - 07/06 0700 In: 2190.4 [P.O.:1424; I.V.:766.4] Out: 400 [Urine:400]  Lab Results: Recent Labs    06/22/22 0816 06/23/22 0613  WBC 14.5* 11.9*  HGB 13.0 12.1  HCT 39.6 36.6  PLT 233 221   BMET Recent Labs    06/21/22 0507 06/22/22 0520 06/23/22 0613  NA 140 139 138  K 2.8* 4.1 3.6  CL 111 118* 103  CO2 22 16* 28  GLUCOSE 118* 108* 106*  BUN <5* <5* <5*  CREATININE 0.75 0.72 0.64  CALCIUM 8.3* 8.6* 8.3*   LFT Recent Labs    06/23/22 0613  PROT 5.4*  ALBUMIN 2.6*  AST 9*  ALT 9  ALKPHOS 57  BILITOT 0.5   Assessment / Plan: 1.  Severe persistent diarrhea: Ongoing greater than 12 days, mild fever in the ED, CT with misty appearance of the root of the mesentery, C. difficile, GI pathogen panel, O&P negative, no recent new meds or sick contacts, colonoscopy 7/3 was normal, biopsies done to rule out microscopic colitis-there are changes of  reparative change most prominently. Minimally increased intraepithelial lymphocytosis which raises possibility for microscopic colitis however does not have classic findings for this.  Sed rate, CRP, TSH, celiac labs all good.  Moderate improvement on Lomotil every 4 hours and addition of Colestid 2 g twice daily in the past 12 hours.  Working diagnosis I think was infectious source as symptoms were sudden in onset vs microscopic colitis although not classic on biopsies. 2.  Psoriatic arthritis: On Simponi and methotrexate, methotrexate on hold. 3.  Leukocytosis:  Is improving at 11.9K this AM.  UA without any significant findings.    -Will still proceed with CTE for completeness since symptoms were so dramatic.   -Continue with current meds for now.    LOS: 4 days   Melissa Middleton. Melissa Middleton  06/23/2022, 8:40 AM

## 2022-06-23 NOTE — Progress Notes (Signed)
PROGRESS NOTE    Melissa Middleton  JQZ:[id number] DOB: [birth year] DOA: 06/17/2022 PCP: Shelda Pal, DO     Brief Narrative:  66 year old WF PMHx GERD, anxiety, depression, insomnia, Nissen fundoplication, seasonal allergies, osteoarthritis, and psoriasis on methotrexate   Presented with nausea, vomiting, fever with worsening diarrhea.  On presentation, she was febrile to 100.5, tachycardic. CT abdomen and pelvis with nonspecific misty appearance of root of the abdominal mesentery which could be seen in the setting of mesenteric adenitis, colonic diverticulosis without diverticulitis and moderate to large liquid stool burden without evidence of enteric obstruction.  She was started on IV fluids.  GI was subsequently consulted and she underwent colonoscopy on 06/20/2022.   Subjective: 7/6 afebrile A/O x4, just returned from [place].    Assessment & Plan: Covid vaccination;   Principal Problem:   Intractable nausea and vomiting Active Problems:   INSOMNIA, CHRONIC   Anxiety and depression   Gastroesophageal reflux disease without esophagitis   Moderate persistent asthma   OAB (overactive bladder)   Psoriatic arthritis (HCC)   S/P Nissen fundoplication (without gastrostomy tube) procedure   Diarrhea   Gastroenteritis   Hypokalemia   Metabolic acidosis   Acute gastroenteritis   Incontinence of feces  Possible acute gastroenteritis presenting with nausea/vomiting/diarrhea and fever -Still having significant diarrhea and has a rectal tube currently. -Stool testing negative for C. difficile and GI PCR.   -GI was consulted on 06/20/2022 because of significant diarrhea.  She underwent colonoscopy on 06/20/2022 to evaluate for microscopic colitis: Showed diverticulosis in the left colon and the right colon.   -7/5 discussed case with Dr. Zachery Conch GI documents patient pathology NOT fully consistent with microscopic colitis   -7/5 patient's WBC increased overnight.Dr. Zachery Conch  GI may order CT enterography if patient is negative for UTI. -Colestid 2 g daily - Lomotil every 4 hours -7/5 TSH pending -7/6 CTE results pending  Non anion gap metabolic acidosis -Due to diarrhea.   -7/5 sodium bicarb 75 ml/hr -7/6 DC sodium bicarb.  Normal saline 55m/hr   Anxiety/depression/insomnia -Xanax 0.25 mg TID -Trazodone 100 mg qhs.   Hypokalemia -Potassium goal> 4 - Resolved.   Hypophosphatemia -Phosphorus goal> 2.5   History of psoriasis -Methotrexate on hold.  Outpatient follow-up with rheumatology.      Mobility Assessment (last 72 hours)     Mobility Assessment     Row Name 06/23/22 0911 06/22/22 0900         Does patient have an order for bedrest or is patient medically unstable No - Continue assessment No - Continue assessment      What is the highest level of mobility based on the progressive mobility assessment? Level 5 (Walks with assist in room/hall) - Balance while stepping forward/back and can walk in room with assist - Complete Level 5 (Walks with assist in room/hall) - Balance while stepping forward/back and can walk in room with assist - Complete                     DVT prophylaxis: Lovenox Code Status: Full Family Communication: 7/5 son at bedside for discussion of plan of care all questions answered Status is: Inpatient    Dispo: The patient is from: Home              Anticipated d/c is to: Home              Anticipated d/c date is: 2 days  Patient currently is not medically stable to d/c.      Consultants:  GI  Procedures/Significant Events:    I have personally reviewed and interpreted all radiology studies and my findings are as above.  VENTILATOR SETTINGS:    Cultures 6/30 GI panel negative 7/1 C. difficile antigen negative, C. difficile toxin negative   Antimicrobials: Anti-infectives (From admission, onward)    Start     Ordered Stop   06/18/22 1700  azithromycin (ZITHROMAX) tablet  500 mg  Status:  Discontinued        06/18/22 0857 06/19/22 0935   06/17/22 1715  ciprofloxacin (CIPRO) tablet 500 mg        06/17/22 1714 06/17/22 1721         Devices    LINES / TUBES:      Continuous Infusions:  sodium bicarbonate 150 mEq in dextrose 5 % 1,150 mL infusion 75 mL/hr at 06/23/22 0827     Objective: Vitals:   06/22/22 2034 06/23/22 0500 06/23/22 0554 06/23/22 1400  BP: (!) 159/91  128/73 125/72  Pulse: 70  85 (!) 105  Resp: '20  18 16  '$ Temp: 98.4 F (36.9 C)  98.9 F (37.2 C) 99.5 F (37.5 C)  TempSrc: Oral  Oral Oral  SpO2: 99%  100%   Weight:  80.2 kg      Intake/Output Summary (Last 24 hours) at 06/23/2022 1533 Last data filed at 06/23/2022 0160 Gross per 24 hour  Intake 2426.39 ml  Output 400 ml  Net 2026.39 ml    Filed Weights   06/21/22 0500 06/22/22 0500 06/23/22 0500  Weight: 81.4 kg 80.5 kg 80.2 kg    Examination:  General: A/O x4, No acute respiratory distress Eyes: negative scleral hemorrhage, negative anisocoria, negative icterus ENT: Negative Runny nose, negative gingival bleeding, Neck:  Negative scars, masses, torticollis, lymphadenopathy, JVD Lungs: Clear to auscultation bilaterally without wheezes or crackles Cardiovascular: Regular rate and rhythm without murmur gallop or rub normal S1 and S2 Abdomen: negative abdominal pain, nondistended, positive soft, bowel sounds, no rebound, no ascites, no appreciable mass, positive right CVA tenderness Extremities: No significant cyanosis, clubbing, or edema bilateral lower extremities Skin: Negative rashes, lesions, ulcers Psychiatric:  Negative depression, positive anxiety, negative fatigue, negative mania  Central nervous system:  Cranial nerves II through XII intact, tongue/uvula midline, all extremities muscle strength 5/5, sensation intact throughout, negative dysarthria, negative expressive aphasia, negative receptive aphasia.  .     Data Reviewed: Care during the described  time interval was provided by me .  I have reviewed this patient's available data, including medical history, events of note, physical examination, and all test results as part of my evaluation.  CBC: Recent Labs  Lab 06/17/22 1250 06/18/22 0556 06/20/22 0513 06/22/22 0816 06/23/22 0613  WBC 6.5 6.6 10.9* 14.5* 11.9*  NEUTROABS 5.2  --  7.1 7.0 6.0  HGB 13.6 12.7 13.5 13.0 12.1  HCT 40.7 38.4 42.1 39.6 36.6  MCV 90.8 92.8 95.5 93.2 91.7  PLT 230 230 265 233 109    Basic Metabolic Panel: Recent Labs  Lab 06/18/22 0757 06/18/22 1633 06/19/22 0603 06/20/22 0513 06/21/22 0507 06/22/22 0520 06/22/22 0816 06/23/22 0613  NA 141 140 140 139 140 139  --  138  K 2.9* 3.2* 3.7 4.1 2.8* 4.1  --  3.6  CL 114* 114* 117* 117* 111 118*  --  103  CO2 19* 19* 17* 16* 22 16*  --  28  GLUCOSE 110* 98 89  93 118* 108*  --  106*  BUN 9 8 6* <5* <5* <5*  --  <5*  CREATININE 0.80 0.83 0.73 0.76 0.75 0.72  --  0.64  CALCIUM 8.1* 8.2* 8.0* 8.2* 8.3* 8.6*  --  8.3*  MG 1.7  --  2.3 1.9 1.9 1.9 1.9 1.7  PHOS 2.6 2.1* 1.7*  --   --   --  2.6 3.5    GFR: Estimated Creatinine Clearance: 72.4 mL/min (by C-G formula based on SCr of 0.64 mg/dL). Liver Function Tests: Recent Labs  Lab 06/17/22 1250 06/18/22 0757 06/18/22 1633 06/23/22 0613  AST 16 11*  --  9*  ALT 12 9  --  9  ALKPHOS 68 45  --  57  BILITOT 0.6 0.6  --  0.5  PROT 7.0 5.6*  --  5.4*  ALBUMIN 3.7 2.9* 3.0* 2.6*    Recent Labs  Lab 06/17/22 1250  LIPASE 21    No results for input(s): "AMMONIA" in the last 168 hours. Coagulation Profile: No results for input(s): "INR", "PROTIME" in the last 168 hours. Cardiac Enzymes: No results for input(s): "CKTOTAL", "CKMB", "CKMBINDEX", "TROPONINI" in the last 168 hours. BNP (last 3 results) No results for input(s): "PROBNP" in the last 8760 hours. HbA1C: No results for input(s): "HGBA1C" in the last 72 hours. CBG: No results for input(s): "GLUCAP" in the last 168 hours. Lipid  Profile: No results for input(s): "CHOL", "HDL", "LDLCALC", "TRIG", "CHOLHDL", "LDLDIRECT" in the last 72 hours. Thyroid Function Tests: Recent Labs    06/23/22 0613  TSH 1.014   Anemia Panel: No results for input(s): "VITAMINB12", "FOLATE", "FERRITIN", "TIBC", "IRON", "RETICCTPCT" in the last 72 hours. Sepsis Labs: Recent Labs  Lab 06/17/22 1323  LATICACIDVEN 1.8     Recent Results (from the past 240 hour(s))  Clostridium Difficile by PCR     Status: None   Collection Time: 06/17/22  8:08 AM   Specimen: Stool   Stool  Result Value Ref Range Status   Toxigenic C. Difficile by PCR Negative Negative Final  Stool Culture     Status: None   Collection Time: 06/17/22  8:08 AM   Specimen: Stool   Stool  Result Value Ref Range Status   Salmonella/Shigella Screen Final report  Final   Stool Culture result 1 (RSASHR) Comment  Final    Comment: No Salmonella or Shigella recovered.   Campylobacter Culture Final report  Final   Stool Culture result 1 (CMPCXR) Comment  Final    Comment: No Campylobacter species isolated.   E coli, Shiga toxin Assay Negative Negative Final  Blood culture (routine x 2)     Status: None   Collection Time: 06/17/22 12:49 PM   Specimen: Right Antecubital; Blood  Result Value Ref Range Status   Specimen Description   Final    RIGHT ANTECUBITAL BLOOD Performed at [facility], [address] [place], [place] [address]    Special Requests   Final    Blood Culture adequate volume BOTTLES DRAWN AEROBIC AND ANAEROBIC Performed at [facility], [address], [place], [place] [address]    Culture   Final    NO GROWTH 5 DAYS Performed at [facility], [place] [address], [place], [place] [address]    Report Status 06/22/2022 FINAL  Final  Blood culture (routine x 2)     Status: None   Collection Time: 06/17/22  2:01 PM   Specimen: Left Antecubital; Blood  Result Value Ref Range Status   Specimen Description   Final    LEFT  ANTECUBITAL BLOOD Performed at [facility], [address] [place], [place] [address]    Special Requests   Final    Blood Culture adequate volume BOTTLES DRAWN AEROBIC AND ANAEROBIC Performed at [facility], [address] [place], [place] [address]    Culture   Final    NO GROWTH 5 DAYS Performed at [facility], [place] [address], [place], [place] [address]    Report Status 06/22/2022 FINAL  Final  Gastrointestinal Panel by PCR , Stool     Status: None   Collection Time: 06/17/22  5:01 PM   Specimen: Stool  Result Value Ref Range Status   Campylobacter species NOT DETECTED NOT DETECTED Final   Plesimonas shigelloides NOT DETECTED NOT DETECTED Final   Salmonella species NOT DETECTED NOT DETECTED Final   Yersinia enterocolitica NOT DETECTED NOT DETECTED Final   Vibrio species NOT DETECTED NOT DETECTED Final   Vibrio cholerae NOT DETECTED NOT DETECTED Final   Enteroaggregative E coli (EAEC) NOT DETECTED NOT DETECTED Final   Enteropathogenic E coli (EPEC) NOT DETECTED NOT DETECTED Final   Enterotoxigenic E coli (ETEC) NOT DETECTED NOT DETECTED Final   Shiga like toxin producing E coli (STEC) NOT DETECTED NOT DETECTED Final   Shigella/Enteroinvasive E coli (EIEC) NOT DETECTED NOT DETECTED Final   Cryptosporidium NOT DETECTED NOT DETECTED Final   Cyclospora cayetanensis NOT DETECTED NOT DETECTED Final   Entamoeba histolytica NOT DETECTED NOT DETECTED Final   Giardia lamblia NOT DETECTED NOT DETECTED Final   Adenovirus F40/41 NOT DETECTED NOT DETECTED Final   Astrovirus NOT DETECTED NOT DETECTED Final   Norovirus GI/GII NOT DETECTED NOT DETECTED Final   Rotavirus A NOT DETECTED NOT DETECTED Final   Sapovirus (I, II, IV, and V) NOT DETECTED NOT DETECTED Final    Comment: Performed at [facility], [address] [place], [place] [address]  C Difficile Quick Screen w PCR reflex     Status: None   Collection Time: 06/18/22  3:42 AM   Specimen:  STOOL  Result Value Ref Range Status   C Diff antigen NEGATIVE NEGATIVE Final   C Diff toxin NEGATIVE NEGATIVE Final   C Diff interpretation No C. difficile detected.  Final    Comment: Performed at [facility], [place] [address], [place], [place] [address]         Radiology Studies: No results found.      Scheduled Meds:  barium       colestipol  2 g Oral 2 times per day   diphenoxylate-atropine  1 tablet Oral Q4H   enoxaparin (LOVENOX) injection  40 mg Subcutaneous Daily   loratadine  10 mg Oral Daily   montelukast  10 mg Oral QHS   pantoprazole  40 mg Oral Daily   traZODone  100 mg Oral QHS   Continuous Infusions:  sodium bicarbonate 150 mEq in dextrose 5 % 1,150 mL infusion 75 mL/hr at 06/23/22 0827     LOS: 4 days    Time spent:40 min    Nirvan Laban, Geraldo Docker, MD [facility]   If 7PM-7AM, please contact night-coverage 06/23/2022, 3:33 PM

## 2022-06-23 NOTE — Plan of Care (Signed)

## 2022-06-24 DIAGNOSIS — R197 Diarrhea, unspecified: Secondary | ICD-10-CM | POA: Diagnosis not present

## 2022-06-24 DIAGNOSIS — R112 Nausea with vomiting, unspecified: Secondary | ICD-10-CM | POA: Diagnosis not present

## 2022-06-24 DIAGNOSIS — K529 Noninfective gastroenteritis and colitis, unspecified: Secondary | ICD-10-CM | POA: Diagnosis not present

## 2022-06-24 DIAGNOSIS — R159 Full incontinence of feces: Secondary | ICD-10-CM | POA: Diagnosis not present

## 2022-06-24 DIAGNOSIS — F419 Anxiety disorder, unspecified: Secondary | ICD-10-CM | POA: Diagnosis not present

## 2022-06-24 LAB — PHOSPHORUS: Phosphorus: 4.6 mg/dL (ref 2.5–4.6)

## 2022-06-24 LAB — MAGNESIUM: Magnesium: 1.6 mg/dL — ABNORMAL LOW (ref 1.7–2.4)

## 2022-06-24 LAB — CBC WITH DIFFERENTIAL/PLATELET
Abs Immature Granulocytes: 1.77 10*3/uL — ABNORMAL HIGH (ref 0.00–0.07)
Basophils Absolute: 0 10*3/uL (ref 0.0–0.1)
Basophils Relative: 0 %
Eosinophils Absolute: 1.2 10*3/uL — ABNORMAL HIGH (ref 0.0–0.5)
Eosinophils Relative: 7 %
HCT: 32.8 % — ABNORMAL LOW (ref 36.0–46.0)
Hemoglobin: 10.7 g/dL — ABNORMAL LOW (ref 12.0–15.0)
Immature Granulocytes: 11 %
Lymphocytes Relative: 21 %
Lymphs Abs: 3.3 10*3/uL (ref 0.7–4.0)
MCH: 29.7 pg (ref 26.0–34.0)
MCHC: 32.6 g/dL (ref 30.0–36.0)
MCV: 91.1 fL (ref 80.0–100.0)
Monocytes Absolute: 1.4 10*3/uL — ABNORMAL HIGH (ref 0.1–1.0)
Monocytes Relative: 9 %
Neutro Abs: 7.9 10*3/uL — ABNORMAL HIGH (ref 1.7–7.7)
Neutrophils Relative %: 52 %
Platelets: 185 10*3/uL (ref 150–400)
RBC: 3.6 MIL/uL — ABNORMAL LOW (ref 3.87–5.11)
RDW: 14.3 % (ref 11.5–15.5)
WBC: 15.5 10*3/uL — ABNORMAL HIGH (ref 4.0–10.5)
nRBC: 0 % (ref 0.0–0.2)

## 2022-06-24 LAB — COMPREHENSIVE METABOLIC PANEL
ALT: 12 U/L (ref 0–44)
AST: 12 U/L — ABNORMAL LOW (ref 15–41)
Albumin: 2.2 g/dL — ABNORMAL LOW (ref 3.5–5.0)
Alkaline Phosphatase: 57 U/L (ref 38–126)
Anion gap: 7 (ref 5–15)
BUN: 8 mg/dL (ref 8–23)
CO2: 28 mmol/L (ref 22–32)
Calcium: 8 mg/dL — ABNORMAL LOW (ref 8.9–10.3)
Chloride: 105 mmol/L (ref 98–111)
Creatinine, Ser: 0.69 mg/dL (ref 0.44–1.00)
GFR, Estimated: >60 mL/min (ref >60)
Glucose, Bld: 102 mg/dL — ABNORMAL HIGH (ref 70–99)
Potassium: 2.9 mmol/L — ABNORMAL LOW (ref 3.5–5.1)
Sodium: 140 mmol/L (ref 135–145)
Total Bilirubin: 0.6 mg/dL (ref 0.3–1.2)
Total Protein: 5 g/dL — ABNORMAL LOW (ref 6.5–8.1)

## 2022-06-24 MED ORDER — POTASSIUM CHLORIDE CRYS ER 20 MEQ PO TBCR
30.0000 meq | EXTENDED_RELEASE_TABLET | Freq: Two times a day (BID) | ORAL | Status: DC
  Administered 2022-06-24 – 2022-06-28 (×9): 30 meq via ORAL
  Filled 2022-06-24 (×9): qty 1

## 2022-06-24 MED ORDER — MAGNESIUM SULFATE 2 GM/50ML IV SOLN
2.0000 g | Freq: Once | INTRAVENOUS | Status: AC
  Administered 2022-06-24: 2 g via INTRAVENOUS
  Filled 2022-06-24: qty 50

## 2022-06-24 MED ORDER — POTASSIUM CHLORIDE 10 MEQ/100ML IV SOLN: 10.0000 meq | INTRAVENOUS | Status: DC

## 2022-06-24 NOTE — Progress Notes (Signed)
PROGRESS NOTE    Melissa SCHLAGEL  HYI:[id number] DOB: 04-Mar-1956 DOA: 06/17/2022 PCP: Shelda Pal, DO     Brief Narrative:  66 year old WF PMHx GERD, anxiety, depression, insomnia, Nissen fundoplication, seasonal allergies, osteoarthritis, and psoriasis on methotrexate   Presented with nausea, vomiting, fever with worsening diarrhea.  On presentation, she was febrile to 100.5, tachycardic. CT abdomen and pelvis with nonspecific misty appearance of root of the abdominal mesentery which could be seen in the setting of mesenteric adenitis, colonic diverticulosis without diverticulitis and moderate to large liquid stool burden without evidence of enteric obstruction.  She was started on IV fluids.  GI was subsequently consulted and she underwent colonoscopy on 06/20/2022.   Subjective: 7/7 afebrile overnight A/O x4, diarrhea overnight s/p CTE.  Patient states believes diarrhea is beginning to dissipate.   Assessment & Plan: Covid vaccination;   Principal Problem:   Intractable nausea and vomiting Active Problems:   INSOMNIA, CHRONIC   Anxiety and depression   Gastroesophageal reflux disease without esophagitis   Moderate persistent asthma   OAB (overactive bladder)   Psoriatic arthritis (HCC)   S/P Nissen fundoplication (without gastrostomy tube) procedure   Diarrhea   Gastroenteritis   Hypokalemia   Metabolic acidosis   Acute gastroenteritis   Incontinence of feces  Possible acute gastroenteritis presenting with nausea/vomiting/diarrhea and fever -Still having significant diarrhea and has a rectal tube currently. -Stool testing negative for C. difficile and GI PCR.   -GI was consulted on 06/20/2022 because of significant diarrhea.  She underwent colonoscopy on 06/20/2022 to evaluate for microscopic colitis: Showed diverticulosis in the left colon and the right colon.   -7/5 discussed case with Dr. Zachery Conch GI documents patient pathology NOT fully consistent with  microscopic colitis   -7/5 patient's WBC increased overnight.Dr. Zachery Conch GI may order CT enterography if patient is negative for UTI. -Colestid 2 g daily - Lomotil every 4 hours -7/5 TSH = 1.0   Non anion gap metabolic acidosis -Due to diarrhea.   -7/5 sodium bicarb 75 ml/hr   Anxiety/depression/insomnia -Xanax 0.25 mg TID -Trazodone 100 mg qhs.   Hypokalemia -Potassium goal> 4 - 7/7 K-Dur 30 mEq BID   Hypophosphatemia -Phosphorus goal> 2.5  Hypomagnesmia - Magnesium goal> 2 - 7/7 Magnesium IV 2 g   History of psoriasis -Methotrexate on hold.  Outpatient follow-up with rheumatology.      Mobility Assessment (last 72 hours)     Mobility Assessment     Row Name 06/23/22 2017 06/23/22 0911 06/22/22 0900       Does patient have an order for bedrest or is patient medically unstable No - Continue assessment No - Continue assessment No - Continue assessment     What is the highest level of mobility based on the progressive mobility assessment? Level 5 (Walks with assist in room/hall) - Balance while stepping forward/back and can walk in room with assist - Complete Level 5 (Walks with assist in room/hall) - Balance while stepping forward/back and can walk in room with assist - Complete Level 5 (Walks with assist in room/hall) - Balance while stepping forward/back and can walk in room with assist - Complete                    DVT prophylaxis: Lovenox Code Status: Full Family Communication: 7/5 son at bedside for discussion of plan of care all questions answered Status is: Inpatient    Dispo: The patient is from: Home  Anticipated d/c is to: Home              Anticipated d/c date is: 2 days              Patient currently is not medically stable to d/c.      Consultants:  GI  Procedures/Significant Events:    I have personally reviewed and interpreted all radiology studies and my findings are as above.  VENTILATOR  SETTINGS:    Cultures 6/30 GI panel negative 7/1 C. difficile antigen negative, C. difficile toxin negative   Antimicrobials: Anti-infectives (From admission, onward)    Start     Ordered Stop   06/18/22 1700  azithromycin (ZITHROMAX) tablet 500 mg  Status:  Discontinued        06/18/22 0857 06/19/22 0935   06/17/22 1715  ciprofloxacin (CIPRO) tablet 500 mg        06/17/22 1714 06/17/22 1721         Devices    LINES / TUBES:      Continuous Infusions:  sodium chloride 75 mL/hr at 06/24/22 0525     Objective: Vitals:   06/23/22 2204 06/24/22 0149 06/24/22 0500 06/24/22 0547  BP: 113/75   (!) 100/54  Pulse: 96   71  Resp: 16   20  Temp: 100 F (37.8 C) 99.2 F (37.3 C)  98 F (36.7 C)  TempSrc: Oral Oral    SpO2: 97%   97%  Weight:   79.9 kg     Intake/Output Summary (Last 24 hours) at 06/24/2022 1221 Last data filed at 06/23/2022 2017 Gross per 24 hour  Intake 240 ml  Output --  Net 240 ml    Filed Weights   06/22/22 0500 06/23/22 0500 06/24/22 0500  Weight: 80.5 kg 80.2 kg 79.9 kg    Examination:  General: A/O x4, No acute respiratory distress Eyes: negative scleral hemorrhage, negative anisocoria, negative icterus ENT: Negative Runny nose, negative gingival bleeding, Neck:  Negative scars, masses, torticollis, lymphadenopathy, JVD Lungs: Clear to auscultation bilaterally without wheezes or crackles Cardiovascular: Regular rate and rhythm without murmur gallop or rub normal S1 and S2 Abdomen: negative abdominal pain, nondistended, positive soft, bowel sounds, no rebound, no ascites, no appreciable mass,  Extremities: No significant cyanosis, clubbing, or edema bilateral lower extremities Skin: Negative rashes, lesions, ulcers Psychiatric:  Negative depression, positive anxiety, negative fatigue, negative mania  Central nervous system:  Cranial nerves II through XII intact, tongue/uvula midline, all extremities muscle strength 5/5, sensation  intact throughout, negative dysarthria, negative expressive aphasia, negative receptive aphasia.  .     Data Reviewed: Care during the described time interval was provided by me .  I have reviewed this patient's available data, including medical history, events of note, physical examination, and all test results as part of my evaluation.  CBC: Recent Labs  Lab 06/17/22 1250 06/18/22 0556 06/20/22 0513 06/22/22 0816 06/23/22 0613 06/24/22 0628  WBC 6.5 6.6 10.9* 14.5* 11.9* 15.5*  NEUTROABS 5.2  --  7.1 7.0 6.0 7.9*  HGB 13.6 12.7 13.5 13.0 12.1 10.7*  HCT 40.7 38.4 42.1 39.6 36.6 32.8*  MCV 90.8 92.8 95.5 93.2 91.7 91.1  PLT 230 230 265 233 221 657    Basic Metabolic Panel: Recent Labs  Lab 06/18/22 1633 06/19/22 0603 06/20/22 0513 06/21/22 0507 06/22/22 0520 06/22/22 0816 06/23/22 0613 06/24/22 0628  NA 140 140 139 140 139  --  138 140  K 3.2* 3.7 4.1 2.8* 4.1  --  3.6 2.9*  CL 114* 117* 117* 111 118*  --  103 105  CO2 19* 17* 16* 22 16*  --  28 28  GLUCOSE 98 89 93 118* 108*  --  106* 102*  BUN 8 6* <5* <5* <5*  --  <5* 8  CREATININE 0.83 0.73 0.76 0.75 0.72  --  0.64 0.69  CALCIUM 8.2* 8.0* 8.2* 8.3* 8.6*  --  8.3* 8.0*  MG  --  2.3 1.9 1.9 1.9 1.9 1.7 1.6*  PHOS 2.1* 1.7*  --   --   --  2.6 3.5 4.6    GFR: Estimated Creatinine Clearance: 72.3 mL/min (by C-G formula based on SCr of 0.69 mg/dL). Liver Function Tests: Recent Labs  Lab 06/17/22 1250 06/18/22 0757 06/18/22 1633 06/23/22 0613 06/24/22 0628  AST 16 11*  --  9* 12*  ALT 12 9  --  9 12  ALKPHOS 68 45  --  57 57  BILITOT 0.6 0.6  --  0.5 0.6  PROT 7.0 5.6*  --  5.4* 5.0*  ALBUMIN 3.7 2.9* 3.0* 2.6* 2.2*    Recent Labs  Lab 06/17/22 1250  LIPASE 21    No results for input(s): "AMMONIA" in the last 168 hours. Coagulation Profile: No results for input(s): "INR", "PROTIME" in the last 168 hours. Cardiac Enzymes: No results for input(s): "CKTOTAL", "CKMB", "CKMBINDEX", "TROPONINI" in  the last 168 hours. BNP (last 3 results) No results for input(s): "PROBNP" in the last 8760 hours. HbA1C: No results for input(s): "HGBA1C" in the last 72 hours. CBG: No results for input(s): "GLUCAP" in the last 168 hours. Lipid Profile: No results for input(s): "CHOL", "HDL", "LDLCALC", "TRIG", "CHOLHDL", "LDLDIRECT" in the last 72 hours. Thyroid Function Tests: Recent Labs    06/23/22 0613  TSH 1.014   Anemia Panel: No results for input(s): "VITAMINB12", "FOLATE", "FERRITIN", "TIBC", "IRON", "RETICCTPCT" in the last 72 hours. Sepsis Labs: Recent Labs  Lab 06/17/22 1323  LATICACIDVEN 1.8     Recent Results (from the past 240 hour(s))  Clostridium Difficile by PCR     Status: None   Collection Time: 06/17/22  8:08 AM   Specimen: Stool   Stool  Result Value Ref Range Status   Toxigenic C. Difficile by PCR Negative Negative Final  Ova and parasite examination     Status: None   Collection Time: 06/17/22  8:08 AM   Specimen: Stool  Result Value Ref Range Status   MICRO NUMBER: [id number]  Final   SPECIMEN QUALITY: Adequate  Final   Source STOOL  Final   STATUS: FINAL  Final   CONCENTRATE RESULT: No ova or parasites seen  Final   TRICHROME RESULT: No ova or parasites seen  Final   COMMENT:   Final    Routine Ova and Parasite exam may not detect some parasites that occasionally cause diarrheal illness. Cryptosporidium Antigen and/or Cyclospora and Isospora Exam may be ordered to detect these parasites. One negative sample does not necessarily rule out  the presence of a parasitic infection.  For additional information, please refer to https://education.[facility].com/faq/FAQ203 (This link is being provided for informational/ educational purposes only.)   Stool Culture     Status: None   Collection Time: 06/17/22  8:08 AM   Specimen: Stool   Stool  Result Value Ref Range Status   Salmonella/Shigella Screen Final report  Final   Stool Culture result 1 (RSASHR)  Comment  Final    Comment: No Salmonella or Shigella recovered.  Campylobacter Culture Final report  Final   Stool Culture result 1 (CMPCXR) Comment  Final    Comment: No Campylobacter species isolated.   E coli, Shiga toxin Assay Negative Negative Final  Blood culture (routine x 2)     Status: None   Collection Time: 06/17/22 12:49 PM   Specimen: Right Antecubital; Blood  Result Value Ref Range Status   Specimen Description   Final    RIGHT ANTECUBITAL BLOOD Performed at [facility], [address] [place], [place] [address]    Special Requests   Final    Blood Culture adequate volume BOTTLES DRAWN AEROBIC AND ANAEROBIC Performed at [facility], [address], [place], [place] [address]    Culture   Final    NO GROWTH 5 DAYS Performed at [facility], [place] [address], [place], [place] [address]    Report Status 06/22/2022 FINAL  Final  Blood culture (routine x 2)     Status: None   Collection Time: 06/17/22  2:01 PM   Specimen: Left Antecubital; Blood  Result Value Ref Range Status   Specimen Description   Final    LEFT ANTECUBITAL BLOOD Performed at [facility], [address] [place], [place] [address]    Special Requests   Final    Blood Culture adequate volume BOTTLES DRAWN AEROBIC AND ANAEROBIC Performed at [facility], [address] [place], [place] [address]    Culture   Final    NO GROWTH 5 DAYS Performed at [facility], [place] [address], [place], [place] [address]    Report Status 06/22/2022 FINAL  Final  Gastrointestinal Panel by PCR , Stool     Status: None   Collection Time: 06/17/22  5:01 PM   Specimen: Stool  Result Value Ref Range Status   Campylobacter species NOT DETECTED NOT DETECTED Final   Plesimonas shigelloides NOT DETECTED NOT DETECTED Final   Salmonella species NOT DETECTED NOT DETECTED Final   Yersinia enterocolitica NOT DETECTED NOT DETECTED Final   Vibrio species NOT DETECTED  NOT DETECTED Final   Vibrio cholerae NOT DETECTED NOT DETECTED Final   Enteroaggregative E coli (EAEC) NOT DETECTED NOT DETECTED Final   Enteropathogenic E coli (EPEC) NOT DETECTED NOT DETECTED Final   Enterotoxigenic E coli (ETEC) NOT DETECTED NOT DETECTED Final   Shiga like toxin producing E coli (STEC) NOT DETECTED NOT DETECTED Final   Shigella/Enteroinvasive E coli (EIEC) NOT DETECTED NOT DETECTED Final   Cryptosporidium NOT DETECTED NOT DETECTED Final   Cyclospora cayetanensis NOT DETECTED NOT DETECTED Final   Entamoeba histolytica NOT DETECTED NOT DETECTED Final   Giardia lamblia NOT DETECTED NOT DETECTED Final   Adenovirus F40/41 NOT DETECTED NOT DETECTED Final   Astrovirus NOT DETECTED NOT DETECTED Final   Norovirus GI/GII NOT DETECTED NOT DETECTED Final   Rotavirus A NOT DETECTED NOT DETECTED Final   Sapovirus (I, II, IV, and V) NOT DETECTED NOT DETECTED Final    Comment: Performed at [facility], [address] [place], [place] [address]  C Difficile Quick Screen w PCR reflex     Status: None   Collection Time: 06/18/22  3:42 AM   Specimen: STOOL  Result Value Ref Range Status   C Diff antigen NEGATIVE NEGATIVE Final   C Diff toxin NEGATIVE NEGATIVE Final   C Diff interpretation No C. difficile detected.  Final    Comment: Performed at [facility]  [place] [address], [place], [place] [address]         Radiology Studies: CT ENTERO ABD/PELVIS W CONTAST  Result Date: 06/23/2022 CLINICAL DATA:  Persistent diarrhea. EXAM: CT ABDOMEN AND PELVIS WITH CONTRAST (ENTEROGRAPHY) TECHNIQUE: Multidetector CT of the abdomen and pelvis during bolus administration of intravenous contrast. Negative oral contrast was given. RADIATION DOSE REDUCTION: This exam was performed according to the departmental dose-optimization program which includes automated exposure control, adjustment of the mA and/or kV according to patient size and/or use of iterative reconstruction  technique. CONTRAST:  15m OMNIPAQUE IOHEXOL 300 MG/ML  SOLN COMPARISON:  CT scan 06/17/2022 FINDINGS: Lower chest:  Insert lung bases Hepatobiliary: No hepatic lesions or intrahepatic biliary dilatation. The gallbladder contains layering high attenuation material which is likely vicarious excretion of contrast from the prior CT scan. There was no evidence of sludge or stones on the prior CT. No common bile duct dilatation. Pancreas: No mass, inflammation or ductal dilatation. Spleen: Normal size.  No focal lesions. Adrenals/Urinary Tract: Adrenal glands and kidneys are normal. No renal lesions, renal calculi or hydronephrosis. The bladder is unremarkable. Stomach/Bowel: The stomach, duodenum and small bowel are well distended. No areas of abnormal mucosal enhancement to suggest inflammatory bowel disease or Crohn's disease. The terminal ileum is unremarkable. The appendix is normal. There is moderate fluid in the cecum. No inflammatory changes. Scattered colonic diverticulosis, most notable in the sigmoid colon but no findings for acute diverticulitis. Vascular/Lymphatic: Scattered distal aortic calcifications but no aneurysm or dissection. The branch vessels are patent. The major venous structures are patent. The inflammatory changes in the root of the small bowel mesentery seen on the prior CT scan have resolved. There are some residual scattered borderline mesenteric lymph nodes. Findings suggest resolving mesenteric adenitis. No retroperitoneal mass or adenopathy. Reproductive: The uterus and ovaries are unremarkable. Bilateral tubal ligation clips are noted. Other: No free pelvic fluid collections or pelvic adenopathy the. No abdominal wall hernia. Musculoskeletal: No significant bony findings. Advanced degenerative changes involving the spine. IMPRESSION: 1. No CT findings to suggest inflammatory bowel disease or Crohn's disease. 2. Interval resolution of inflammatory changes in the root of the small bowel  mesentery with some residual scattered borderline mesenteric lymph nodes. Findings suggest resolving mesenteric adenitis. 3. Colonic diverticulosis without findings for acute diverticulitis. Aortic Atherosclerosis (ICD10-I70.0). Electronically Signed   By: PMarijo SanesM.D.   On: 06/23/2022 16:07        Scheduled Meds:  colestipol  2 g Oral 2 times per day   diphenoxylate-atropine  1 tablet Oral Q4H   enoxaparin (LOVENOX) injection  40 mg Subcutaneous Daily   loratadine  10 mg Oral Daily   montelukast  10 mg Oral QHS   pantoprazole  40 mg Oral Daily   traZODone  100 mg Oral QHS   Continuous Infusions:  sodium chloride 75 mL/hr at 06/24/22 0525     LOS: 5 days    Time spent:40 min    Sommer Spickard, CGeraldo Docker MD [facility]   If 7PM-7AM, please contact night-coverage 06/24/2022, 12:21 PM

## 2022-06-24 NOTE — Progress Notes (Signed)
[facility] Progress Note  CC:  Diarrhea  Subjective:  Continues to feel better except for drinking the CTE contrast yesterday.  Eating well.    Objective:  Vital signs in last 24 hours: Temp:  [98 F (36.7 C)-100 F (37.8 C)] 98 F (36.7 C) (07/07 0547) Pulse Rate:  [71-105] 71 (07/07 0547) Resp:  [16-20] 20 (07/07 0547) BP: (100-125)/(54-75) 100/54 (07/07 0547) SpO2:  [97 %] 97 % (07/07 0547) Weight:  [79.9 kg] 79.9 kg (07/07 0500) Last BM Date : 06/23/22 General:  Alert, Well-developed, in NAD Heart:  Regular rate and rhythm; no murmurs Pulm:  CTAB. No W/R/R. Abdomen:  Soft, non-distended.  BS present.  Non-tender.  Extremities:  Without edema. Neurologic:  Alert and oriented x 4;  grossly normal neurologically. Psych:  Alert and cooperative. Normal mood and affect.  Intake/Output from previous day: 07/06 0701 - 07/07 0700 In: 830 [P.O.:830] Out: -   Lab Results: Recent Labs    06/22/22 0816 06/23/22 0613 06/24/22 0628  WBC 14.5* 11.9* 15.5*  HGB 13.0 12.1 10.7*  HCT 39.6 36.6 32.8*  PLT 233 221 185   BMET Recent Labs    06/22/22 0520 06/23/22 0613 06/24/22 0628  NA 139 138 140  K 4.1 3.6 2.9*  CL 118* 103 105  CO2 16* 28 28  GLUCOSE 108* 106* 102*  BUN <5* <5* 8  CREATININE 0.72 0.64 0.69  CALCIUM 8.6* 8.3* 8.0*   LFT Recent Labs    06/24/22 0628  PROT 5.0*  ALBUMIN 2.2*  AST 12*  ALT 12  ALKPHOS 57  BILITOT 0.6   CT ENTERO ABD/PELVIS W CONTAST  Result Date: 06/23/2022 CLINICAL DATA:  Persistent diarrhea. EXAM: CT ABDOMEN AND PELVIS WITH CONTRAST (ENTEROGRAPHY) TECHNIQUE: Multidetector CT of the abdomen and pelvis during bolus administration of intravenous contrast. Negative oral contrast was given. RADIATION DOSE REDUCTION: This exam was performed according to the departmental dose-optimization program which includes automated exposure control, adjustment of the mA and/or kV according to patient size and/or use of  iterative reconstruction technique. CONTRAST:  168m OMNIPAQUE IOHEXOL 300 MG/ML  SOLN COMPARISON:  CT scan 06/17/2022 FINDINGS: Lower chest:  Insert lung bases Hepatobiliary: No hepatic lesions or intrahepatic biliary dilatation. The gallbladder contains layering high attenuation material which is likely vicarious excretion of contrast from the prior CT scan. There was no evidence of sludge or stones on the prior CT. No common bile duct dilatation. Pancreas: No mass, inflammation or ductal dilatation. Spleen: Normal size.  No focal lesions. Adrenals/Urinary Tract: Adrenal glands and kidneys are normal. No renal lesions, renal calculi or hydronephrosis. The bladder is unremarkable. Stomach/Bowel: The stomach, duodenum and small bowel are well distended. No areas of abnormal mucosal enhancement to suggest inflammatory bowel disease or Crohn's disease. The terminal ileum is unremarkable. The appendix is normal. There is moderate fluid in the cecum. No inflammatory changes. Scattered colonic diverticulosis, most notable in the sigmoid colon but no findings for acute diverticulitis. Vascular/Lymphatic: Scattered distal aortic calcifications but no aneurysm or dissection. The branch vessels are patent. The major venous structures are patent. The inflammatory changes in the root of the small bowel mesentery seen on the prior CT scan have resolved. There are some residual scattered borderline mesenteric lymph nodes. Findings suggest resolving mesenteric adenitis. No retroperitoneal mass or adenopathy. Reproductive: The uterus and ovaries are unremarkable. Bilateral tubal ligation clips are noted. Other: No free pelvic fluid collections or pelvic adenopathy the. No abdominal wall hernia. Musculoskeletal: No  significant bony findings. Advanced degenerative changes involving the spine. IMPRESSION: 1. No CT findings to suggest inflammatory bowel disease or Crohn's disease. 2. Interval resolution of inflammatory changes in the  root of the small bowel mesentery with some residual scattered borderline mesenteric lymph nodes. Findings suggest resolving mesenteric adenitis. 3. Colonic diverticulosis without findings for acute diverticulitis. Aortic Atherosclerosis (ICD10-I70.0). Electronically Signed   By: Marijo Sanes M.D.   On: 06/23/2022 16:07    Assessment / Plan: 1.  Severe persistent diarrhea: Ongoing greater than 12 days, mild fever in the ED, CT with misty appearance of the root of the mesentery, C. difficile, GI pathogen panel, O&P negative, no recent new meds or sick contacts, colonoscopy 7/3 was normal, biopsies done to rule out microscopic colitis-there are changes of reparative change most prominently. Minimally increased intraepithelial lymphocytosis which raises possibility for microscopic colitis however does not have classic findings for this.  Sed rate, CRP, TSH, celiac labs all good.  Moderate improvement on Lomotil every 4 hours and addition of Colestid 2 g twice daily in the past 12 hours.  Working diagnosis I think was infectious source as symptoms were sudden in onset vs microscopic colitis although not classic on biopsies.  CTE showed resolution of previous findings, only some persistent/residual borderline lymph nodes, likely reactive. 2.  Psoriatic arthritis: On Simponi and methotrexate, methotrexate on hold. 3.  Leukocytosis:  WBC count back up some today at 15.5.  ? Etiology. 4.  Hypokalemia:  K+ 2.9 today.   -Needs OOB and possibly PT eval. -Continue current meds.  If still doing ok throughout the day then likely home tomorrow with current anti-diarrheals.  Will arrange for hospital follow-up with Dr. Loletha Carrow.   LOS: 5 days   Melissa Middleton. Melissa Middleton  06/24/2022, 8:48 AM

## 2022-06-24 NOTE — Plan of Care (Signed)

## 2022-06-25 DIAGNOSIS — R112 Nausea with vomiting, unspecified: Secondary | ICD-10-CM | POA: Diagnosis not present

## 2022-06-25 DIAGNOSIS — R197 Diarrhea, unspecified: Secondary | ICD-10-CM | POA: Diagnosis not present

## 2022-06-25 DIAGNOSIS — K529 Noninfective gastroenteritis and colitis, unspecified: Secondary | ICD-10-CM | POA: Diagnosis not present

## 2022-06-25 DIAGNOSIS — F419 Anxiety disorder, unspecified: Secondary | ICD-10-CM | POA: Diagnosis not present

## 2022-06-25 LAB — COMPREHENSIVE METABOLIC PANEL
ALT: 12 U/L (ref 0–44)
AST: 12 U/L — ABNORMAL LOW (ref 15–41)
Albumin: 2.1 g/dL — ABNORMAL LOW (ref 3.5–5.0)
Alkaline Phosphatase: 53 U/L (ref 38–126)
Anion gap: 4 — ABNORMAL LOW (ref 5–15)
BUN: 8 mg/dL (ref 8–23)
CO2: 24 mmol/L (ref 22–32)
Calcium: 7.7 mg/dL — ABNORMAL LOW (ref 8.9–10.3)
Chloride: 109 mmol/L (ref 98–111)
Creatinine, Ser: 0.59 mg/dL (ref 0.44–1.00)
GFR, Estimated: >60 mL/min (ref >60)
Glucose, Bld: 106 mg/dL — ABNORMAL HIGH (ref 70–99)
Potassium: 3.6 mmol/L (ref 3.5–5.1)
Sodium: 137 mmol/L (ref 135–145)
Total Bilirubin: 0.3 mg/dL (ref 0.3–1.2)
Total Protein: 4.7 g/dL — ABNORMAL LOW (ref 6.5–8.1)

## 2022-06-25 LAB — BLOOD CULTURE ID PANEL (REFLEXED) - BCID2

## 2022-06-25 LAB — CBC WITH DIFFERENTIAL/PLATELET
Abs Immature Granulocytes: 1.6 10*3/uL — ABNORMAL HIGH (ref 0.00–0.07)
Basophils Absolute: 0 10*3/uL (ref 0.0–0.1)
Basophils Relative: 0 %
Eosinophils Absolute: 0.8 10*3/uL — ABNORMAL HIGH (ref 0.0–0.5)
Eosinophils Relative: 6 %
HCT: 31.7 % — ABNORMAL LOW (ref 36.0–46.0)
Hemoglobin: 10.4 g/dL — ABNORMAL LOW (ref 12.0–15.0)
Immature Granulocytes: 12 %
Lymphocytes Relative: 21 %
Lymphs Abs: 3 10*3/uL (ref 0.7–4.0)
MCH: 30.1 pg (ref 26.0–34.0)
MCHC: 32.8 g/dL (ref 30.0–36.0)
MCV: 91.9 fL (ref 80.0–100.0)
Monocytes Absolute: 1.2 10*3/uL — ABNORMAL HIGH (ref 0.1–1.0)
Monocytes Relative: 8 %
Neutro Abs: 7.3 10*3/uL (ref 1.7–7.7)
Neutrophils Relative %: 53 %
Platelets: 167 10*3/uL (ref 150–400)
RBC: 3.45 MIL/uL — ABNORMAL LOW (ref 3.87–5.11)
RDW: 14.4 % (ref 11.5–15.5)
WBC: 13.9 10*3/uL — ABNORMAL HIGH (ref 4.0–10.5)
nRBC: 0 % (ref 0.0–0.2)

## 2022-06-25 LAB — MAGNESIUM: Magnesium: 2 mg/dL (ref 1.7–2.4)

## 2022-06-25 LAB — PHOSPHORUS: Phosphorus: 3.7 mg/dL (ref 2.5–4.6)

## 2022-06-25 MED ORDER — CIPROFLOXACIN HCL 500 MG PO TABS
500.0000 mg | ORAL_TABLET | Freq: Two times a day (BID) | ORAL | Status: DC
  Administered 2022-06-25 – 2022-06-27 (×5): 500 mg via ORAL
  Filled 2022-06-25 (×5): qty 1

## 2022-06-25 NOTE — Progress Notes (Signed)
PROGRESS NOTE    Melissa Middleton  GNF:[id number] DOB: 1956/07/03 DOA: 06/17/2022 PCP: Shelda Pal, DO     Brief Narrative:  66 year old WF PMHx GERD, anxiety, depression, insomnia, Nissen fundoplication, seasonal allergies, osteoarthritis, and psoriasis on methotrexate   Presented with nausea, vomiting, fever with worsening diarrhea.  On presentation, she was febrile to 100.5, tachycardic. CT abdomen and pelvis with nonspecific misty appearance of root of the abdominal mesentery which could be seen in the setting of mesenteric adenitis, colonic diverticulosis without diverticulitis and moderate to large liquid stool burden without evidence of enteric obstruction.  She was started on IV fluids.  GI was subsequently consulted and she underwent colonoscopy on 06/20/2022.   Subjective: 7/8 afebrile overnight, slightly elevated temp: Tmax= 37.2 C, C/O dysuria    Assessment & Plan: Covid vaccination;   Principal Problem:   Intractable nausea and vomiting Active Problems:   INSOMNIA, CHRONIC   Anxiety and depression   Gastroesophageal reflux disease without esophagitis   Moderate persistent asthma   OAB (overactive bladder)   Psoriatic arthritis (HCC)   S/P Nissen fundoplication (without gastrostomy tube) procedure   Diarrhea   Gastroenteritis   Hypokalemia   Metabolic acidosis   Acute gastroenteritis   Incontinence of feces  Possible acute gastroenteritis presenting with nausea/vomiting/diarrhea and fever -Still having significant diarrhea and has a rectal tube currently. -Stool testing negative for C. difficile and GI PCR.   -GI was consulted on 06/20/2022 because of significant diarrhea.  She underwent colonoscopy on 06/20/2022 to evaluate for microscopic colitis: Showed diverticulosis in the left colon and the right colon.   -7/5 discussed case with Dr. Zachery Conch GI documents patient pathology NOT fully consistent with microscopic colitis   -7/5 patient's WBC  increased overnight.Dr. Zachery Conch GI may order CT enterography if patient is negative for UTI. -Colestid 2 g daily - Lomotil every 4 hours -7/5 TSH = 1.0   Leukocytosis - Multifactorial, presumed UTI, reactive from diarrhea and CTE - See UTI  UTI? - 7/8 empiric coverage with ciprofloxacin 500 mg BID, allergy to cephalosporin  Non anion gap metabolic acidosis -Due to diarrhea.   -7/5 sodium bicarb 75 ml/hr -Resolved - 7/6 normal saline65m/hr   Anxiety/depression/insomnia -Xanax 0.25 mg TID -Trazodone 100 mg qhs.   Hypokalemia -Potassium goal> 4 - 7/7 K-Dur 30 mEq BID   Hypophosphatemia -Phosphorus goal> 2.5  Hypomagnesmia - Magnesium goal> 2 - 7/7 Magnesium IV 2 g   History of psoriasis -Methotrexate on hold.  Outpatient follow-up with rheumatology.      Mobility Assessment (last 72 hours)     Mobility Assessment     Row Name 06/25/22 0940 06/24/22 1954 06/23/22 2017 06/23/22 0911     Does patient have an order for bedrest or is patient medically unstable No - Continue assessment No - Continue assessment No - Continue assessment No - Continue assessment    What is the highest level of mobility based on the progressive mobility assessment? Level 6 (Walks independently in room and hall) - Balance while walking in room without assist - Complete Level 6 (Walks independently in room and hall) - Balance while walking in room without assist - Complete Level 5 (Walks with assist in room/hall) - Balance while stepping forward/back and can walk in room with assist - Complete Level 5 (Walks with assist in room/hall) - Balance while stepping forward/back and can walk in room with assist - Complete  DVT prophylaxis: Lovenox Code Status: Full Family Communication: 7/5 son at bedside for discussion of plan of care all questions answered Status is: Inpatient    Dispo: The patient is from: Home              Anticipated d/c is to: Home               Anticipated d/c date is: 2 days              Patient currently is not medically stable to d/c.      Consultants:  GI  Procedures/Significant Events:    I have personally reviewed and interpreted all radiology studies and my findings are as above.  VENTILATOR SETTINGS:    Cultures 6/30 GI panel negative 7/1 C. difficile antigen negative, C. difficile toxin negative   Antimicrobials: Anti-infectives (From admission, onward)    Start     Ordered Stop   06/18/22 1700  azithromycin (ZITHROMAX) tablet 500 mg  Status:  Discontinued        06/18/22 0857 06/19/22 0935   06/17/22 1715  ciprofloxacin (CIPRO) tablet 500 mg        06/17/22 1714 06/17/22 1721         Devices    LINES / TUBES:      Continuous Infusions:  sodium chloride 75 mL/hr at 06/24/22 1955     Objective: Vitals:   06/24/22 1436 06/24/22 1940 06/25/22 0500 06/25/22 0515  BP: 119/87 120/66  112/60  Pulse: 75 96  72  Resp: (!) '22 18  18  '$ Temp: 99 F (37.2 C) 98.5 F (36.9 C)  98.2 F (36.8 C)  TempSrc: Oral Oral  Oral  SpO2: 99% 100%  97%  Weight:   84.5 kg     Intake/Output Summary (Last 24 hours) at 06/25/2022 1051 Last data filed at 06/24/2022 1959 Gross per 24 hour  Intake 3801.42 ml  Output --  Net 3801.42 ml    Filed Weights   06/23/22 0500 06/24/22 0500 06/25/22 0500  Weight: 80.2 kg 79.9 kg 84.5 kg    Examination:  General: A/O x4, No acute respiratory distress Eyes: negative scleral hemorrhage, negative anisocoria, negative icterus ENT: Negative Runny nose, negative gingival bleeding, Neck:  Negative scars, masses, torticollis, lymphadenopathy, JVD Lungs: Clear to auscultation bilaterally without wheezes or crackles Cardiovascular: Regular rate and rhythm without murmur gallop or rub normal S1 and S2 Abdomen: negative abdominal pain, nondistended, positive soft, bowel sounds, no rebound, no ascites, no appreciable mass,  Extremities: No significant cyanosis, clubbing, or  edema bilateral lower extremities Skin: Negative rashes, lesions, ulcers Psychiatric:  Negative depression, positive anxiety, negative fatigue, negative mania  Central nervous system:  Cranial nerves II through XII intact, tongue/uvula midline, all extremities muscle strength 5/5, sensation intact throughout, negative dysarthria, negative expressive aphasia, negative receptive aphasia.  .     Data Reviewed: Care during the described time interval was provided by me .  I have reviewed this patient's available data, including medical history, events of note, physical examination, and all test results as part of my evaluation.  CBC: Recent Labs  Lab 06/20/22 0513 06/22/22 0816 06/23/22 0613 06/24/22 0628 06/25/22 0455  WBC 10.9* 14.5* 11.9* 15.5* 13.9*  NEUTROABS 7.1 7.0 6.0 7.9* 7.3  HGB 13.5 13.0 12.1 10.7* 10.4*  HCT 42.1 39.6 36.6 32.8* 31.7*  MCV 95.5 93.2 91.7 91.1 91.9  PLT 265 233 221 185 751    Basic Metabolic Panel: Recent Labs  Lab 06/19/22 0603  06/20/22 4401 06/21/22 0507 06/22/22 0520 06/22/22 0816 06/23/22 0272 06/24/22 0628 06/25/22 0455  NA 140   < > 140 139  --  138 140 137  K 3.7   < > 2.8* 4.1  --  3.6 2.9* 3.6  CL 117*   < > 111 118*  --  103 105 109  CO2 17*   < > 22 16*  --  '28 28 24  '$ GLUCOSE 89   < > 118* 108*  --  106* 102* 106*  BUN 6*   < > <5* <5*  --  <5* 8 8  CREATININE 0.73   < > 0.75 0.72  --  0.64 0.69 0.59  CALCIUM 8.0*   < > 8.3* 8.6*  --  8.3* 8.0* 7.7*  MG 2.3   < > 1.9 1.9 1.9 1.7 1.6* 2.0  PHOS 1.7*  --   --   --  2.6 3.5 4.6 3.7   < > = values in this interval not displayed.    GFR: Estimated Creatinine Clearance: 74.3 mL/min (by C-G formula based on SCr of 0.59 mg/dL). Liver Function Tests: Recent Labs  Lab 06/18/22 1633 06/23/22 0613 06/24/22 0628 06/25/22 0455  AST  --  9* 12* 12*  ALT  --  '9 12 12  '$ ALKPHOS  --  57 57 53  BILITOT  --  0.5 0.6 0.3  PROT  --  5.4* 5.0* 4.7*  ALBUMIN 3.0* 2.6* 2.2* 2.1*    No  results for input(s): "LIPASE", "AMYLASE" in the last 168 hours.  No results for input(s): "AMMONIA" in the last 168 hours. Coagulation Profile: No results for input(s): "INR", "PROTIME" in the last 168 hours. Cardiac Enzymes: No results for input(s): "CKTOTAL", "CKMB", "CKMBINDEX", "TROPONINI" in the last 168 hours. BNP (last 3 results) No results for input(s): "PROBNP" in the last 8760 hours. HbA1C: No results for input(s): "HGBA1C" in the last 72 hours. CBG: No results for input(s): "GLUCAP" in the last 168 hours. Lipid Profile: No results for input(s): "CHOL", "HDL", "LDLCALC", "TRIG", "CHOLHDL", "LDLDIRECT" in the last 72 hours. Thyroid Function Tests: Recent Labs    06/23/22 0613  TSH 1.014    Anemia Panel: No results for input(s): "VITAMINB12", "FOLATE", "FERRITIN", "TIBC", "IRON", "RETICCTPCT" in the last 72 hours. Sepsis Labs: No results for input(s): "PROCALCITON", "LATICACIDVEN" in the last 168 hours.   Recent Results (from the past 240 hour(s))  Clostridium Difficile by PCR     Status: None   Collection Time: 06/17/22  8:08 AM   Specimen: Stool   Stool  Result Value Ref Range Status   Toxigenic C. Difficile by PCR Negative Negative Final  Ova and parasite examination     Status: None   Collection Time: 06/17/22  8:08 AM   Specimen: Stool  Result Value Ref Range Status   MICRO NUMBER: [id number]  Final   SPECIMEN QUALITY: Adequate  Final   Source STOOL  Final   STATUS: FINAL  Final   CONCENTRATE RESULT: No ova or parasites seen  Final   TRICHROME RESULT: No ova or parasites seen  Final   COMMENT:   Final    Routine Ova and Parasite exam may not detect some parasites that occasionally cause diarrheal illness. Cryptosporidium Antigen and/or Cyclospora and Isospora Exam may be ordered to detect these parasites. One negative sample does not necessarily rule out  the presence of a parasitic infection.  For additional information, please refer to  https://education.[facility].com/faq/FAQ203 (This link is being  provided for informational/ educational purposes only.)   Stool Culture     Status: None   Collection Time: 06/17/22  8:08 AM   Specimen: Stool   Stool  Result Value Ref Range Status   Salmonella/Shigella Screen Final report  Final   Stool Culture result 1 (RSASHR) Comment  Final    Comment: No Salmonella or Shigella recovered.   Campylobacter Culture Final report  Final   Stool Culture result 1 (CMPCXR) Comment  Final    Comment: No Campylobacter species isolated.   E coli, Shiga toxin Assay Negative Negative Final  Blood culture (routine x 2)     Status: None   Collection Time: 06/17/22 12:49 PM   Specimen: Right Antecubital; Blood  Result Value Ref Range Status   Specimen Description   Final    RIGHT ANTECUBITAL BLOOD Performed at [facility], [address] [place], [place] [address]    Special Requests   Final    Blood Culture adequate volume BOTTLES DRAWN AEROBIC AND ANAEROBIC Performed at [facility], [address], [place], [place] [address]    Culture   Final    NO GROWTH 5 DAYS Performed at [facility], [place] [address] [place], [place] [address]    Report Status 06/22/2022 FINAL  Final  Blood culture (routine x 2)     Status: None   Collection Time: 06/17/22  2:01 PM   Specimen: Left Antecubital; Blood  Result Value Ref Range Status   Specimen Description   Final    LEFT ANTECUBITAL BLOOD Performed at [facility], [address] [place], [place] [address]    Special Requests   Final    Blood Culture adequate volume BOTTLES DRAWN AEROBIC AND ANAEROBIC Performed at [facility], [address], [place], [place] [address]    Culture   Final    NO GROWTH 5 DAYS Performed at [facility], [place] [address], [place], [place] [address]    Report Status 06/22/2022 FINAL  Final  Gastrointestinal Panel by PCR , Stool     Status: None    Collection Time: 06/17/22  5:01 PM   Specimen: Stool  Result Value Ref Range Status   Campylobacter species NOT DETECTED NOT DETECTED Final   Plesimonas shigelloides NOT DETECTED NOT DETECTED Final   Salmonella species NOT DETECTED NOT DETECTED Final   Yersinia enterocolitica NOT DETECTED NOT DETECTED Final   Vibrio species NOT DETECTED NOT DETECTED Final   Vibrio cholerae NOT DETECTED NOT DETECTED Final   Enteroaggregative E coli (EAEC) NOT DETECTED NOT DETECTED Final   Enteropathogenic E coli (EPEC) NOT DETECTED NOT DETECTED Final   Enterotoxigenic E coli (ETEC) NOT DETECTED NOT DETECTED Final   Shiga like toxin producing E coli (STEC) NOT DETECTED NOT DETECTED Final   Shigella/Enteroinvasive E coli (EIEC) NOT DETECTED NOT DETECTED Final   Cryptosporidium NOT DETECTED NOT DETECTED Final   Cyclospora cayetanensis NOT DETECTED NOT DETECTED Final   Entamoeba histolytica NOT DETECTED NOT DETECTED Final   Giardia lamblia NOT DETECTED NOT DETECTED Final   Adenovirus F40/41 NOT DETECTED NOT DETECTED Final   Astrovirus NOT DETECTED NOT DETECTED Final   Norovirus GI/GII NOT DETECTED NOT DETECTED Final   Rotavirus A NOT DETECTED NOT DETECTED Final   Sapovirus (I, II, IV, and V) NOT DETECTED NOT DETECTED Final    Comment: Performed at [facility], [address] [place], [place] [address]  C Difficile Quick Screen w PCR reflex     Status: None   Collection Time: 06/18/22  3:42 AM   Specimen: STOOL  Result Value Ref Range Status   C Diff antigen NEGATIVE NEGATIVE Final   C Diff toxin NEGATIVE NEGATIVE Final   C Diff interpretation No C. difficile detected.  Final    Comment: Performed at [facility], [place] [address], [place], [place] [address]  Culture, blood (Routine X 2) w Reflex to ID Panel     Status: None (Preliminary result)   Collection Time: 06/24/22  6:52 PM   Specimen: BLOOD  Result Value Ref Range Status   Specimen Description   Final    BLOOD  BLOOD RIGHT HAND Performed at [facility] [address], [place], [place] [address]    Special Requests   Final    BOTTLES DRAWN AEROBIC AND ANAEROBIC Blood Culture adequate volume Performed at [place] [address] [place], [place] [address]    Culture   Final    NO GROWTH < 12 HOURS Performed at [facility] [address] [place], [place] [address]    Report Status PENDING  Incomplete  Culture, blood (Routine X 2) w Reflex to ID Panel     Status: None (Preliminary result)   Collection Time: 06/24/22  6:54 PM   Specimen: BLOOD  Result Value Ref Range Status   Specimen Description   Final    BLOOD LEFT ANTECUBITAL Performed at [facility] [address], [place], [place] [address]    Special Requests   Final    BOTTLES DRAWN AEROBIC AND ANAEROBIC Blood Culture adequate volume Performed at [place] [address], [place], [place] [address]    Culture   Final    NO GROWTH < 12 HOURS Performed at [facility] [address] [place],  [address]    Report Status PENDING  Incomplete         Radiology Studies: CT ENTERO ABD/PELVIS W CONTAST  Result Date: 06/23/2022 CLINICAL DATA:  Persistent diarrhea. EXAM: CT ABDOMEN AND PELVIS WITH CONTRAST (ENTEROGRAPHY) TECHNIQUE: Multidetector CT of the abdomen and pelvis during bolus administration of intravenous contrast. Negative oral contrast was given. RADIATION DOSE REDUCTION: This exam was performed according to the departmental dose-optimization program which includes automated exposure control, adjustment of the mA and/or kV according to patient size and/or use of iterative reconstruction technique. CONTRAST:  110m OMNIPAQUE IOHEXOL 300 MG/ML  SOLN COMPARISON:  CT scan 06/17/2022 FINDINGS: Lower chest:  Insert lung bases Hepatobiliary: No hepatic lesions or intrahepatic biliary dilatation. The gallbladder contains  layering high attenuation material which is likely vicarious excretion of contrast from the prior CT scan. There was no evidence of sludge or stones on the prior CT. No common bile duct dilatation. Pancreas: No mass, inflammation or ductal dilatation. Spleen: Normal size.  No focal lesions. Adrenals/Urinary Tract: Adrenal glands and kidneys are normal. No renal lesions, renal calculi or hydronephrosis. The bladder is unremarkable. Stomach/Bowel: The stomach, duodenum and small bowel are well distended. No areas of abnormal mucosal enhancement to suggest inflammatory bowel disease or Crohn's disease. The terminal ileum is unremarkable. The appendix is normal. There is moderate fluid in the cecum. No inflammatory changes. Scattered colonic diverticulosis, most notable in the sigmoid colon but no findings for acute diverticulitis. Vascular/Lymphatic: Scattered distal aortic calcifications but no aneurysm or dissection. The branch vessels are patent. The major  venous structures are patent. The inflammatory changes in the root of the small bowel mesentery seen on the prior CT scan have resolved. There are some residual scattered borderline mesenteric lymph nodes. Findings suggest resolving mesenteric adenitis. No retroperitoneal mass or adenopathy. Reproductive: The uterus and ovaries are unremarkable. Bilateral tubal ligation clips are noted. Other: No free pelvic fluid collections or pelvic adenopathy the. No abdominal wall hernia. Musculoskeletal: No significant bony findings. Advanced degenerative changes involving the spine. IMPRESSION: 1. No CT findings to suggest inflammatory bowel disease or Crohn's disease. 2. Interval resolution of inflammatory changes in the root of the small bowel mesentery with some residual scattered borderline mesenteric lymph nodes. Findings suggest resolving mesenteric adenitis. 3. Colonic diverticulosis without findings for acute diverticulitis. Aortic Atherosclerosis (ICD10-I70.0).  Electronically Signed   By: Marijo Sanes M.D.   On: 06/23/2022 16:07        Scheduled Meds:  colestipol  2 g Oral 2 times per day   diphenoxylate-atropine  1 tablet Oral Q4H   enoxaparin (LOVENOX) injection  40 mg Subcutaneous Daily   loratadine  10 mg Oral Daily   montelukast  10 mg Oral QHS   potassium chloride  30 mEq Oral BID   traZODone  100 mg Oral QHS   Continuous Infusions:  sodium chloride 75 mL/hr at 06/24/22 1955     LOS: 6 days    Time spent:40 min    Vilma Will, Geraldo Docker, MD [facility]   If 7PM-7AM, please contact night-coverage 06/25/2022, 10:51 AM

## 2022-06-25 NOTE — Progress Notes (Signed)
Progress Note   Subjective  Patient states she continues to do better in regards to her bowel function.  She remains quite frustrated being in the hospital.  We had a lengthy discussion about her course to date.  At times she refers to [facility] as a "third world [place]" and states some of the staff here are terrible, and then at times praises the staff and states they have done a wonderful job.  Her main complaint is dysuria today.  She continues to have mild leukocytosis.  Blood cultures and urine culture sent yesterday.    Objective   Vital signs in last 24 hours: Temp:  [98.2 F (36.8 C)-99 F (37.2 C)] 98.2 F (36.8 C) (07/08 0515) Pulse Rate:  [72-96] 72 (07/08 0515) Resp:  [18-22] 18 (07/08 0515) BP: (112-120)/(60-87) 112/60 (07/08 0515) SpO2:  [97 %-100 %] 97 % (07/08 0515) Weight:  [84.5 kg] 84.5 kg (07/08 0500) Last BM Date : 06/24/22 General:    white female in NAD Neurologic:  Alert and oriented,  grossly normal neurologically. Psych:  Cooperative. Normal mood and affect.  Intake/Output from previous day: 07/07 0701 - 07/08 0700 In: 4155.4 [P.O.:2247; I.V.:1858.4; IV Piggyback:50] Out: -  Intake/Output this shift: No intake/output data recorded.  Lab Results: Recent Labs    06/23/22 0613 06/24/22 0628 06/25/22 0455  WBC 11.9* 15.5* 13.9*  HGB 12.1 10.7* 10.4*  HCT 36.6 32.8* 31.7*  PLT 221 185 167   BMET Recent Labs    06/23/22 0613 06/24/22 0628 06/25/22 0455  NA 138 140 137  K 3.6 2.9* 3.6  CL 103 105 109  CO2 '28 28 24  '$ GLUCOSE 106* 102* 106*  BUN <5* 8 8  CREATININE 0.64 0.69 0.59  CALCIUM 8.3* 8.0* 7.7*   LFT Recent Labs    06/25/22 0455  PROT 4.7*  ALBUMIN 2.1*  AST 12*  ALT 12  ALKPHOS 53  BILITOT 0.3   PT/INR No results for input(s): "LABPROT", "INR" in the last 72 hours.  Studies/Results: CT ENTERO ABD/PELVIS W CONTAST  Result Date: 06/23/2022 CLINICAL DATA:  Persistent diarrhea. EXAM: CT ABDOMEN AND PELVIS WITH  CONTRAST (ENTEROGRAPHY) TECHNIQUE: Multidetector CT of the abdomen and pelvis during bolus administration of intravenous contrast. Negative oral contrast was given. RADIATION DOSE REDUCTION: This exam was performed according to the departmental dose-optimization program which includes automated exposure control, adjustment of the mA and/or kV according to patient size and/or use of iterative reconstruction technique. CONTRAST:  168m OMNIPAQUE IOHEXOL 300 MG/ML  SOLN COMPARISON:  CT scan 06/17/2022 FINDINGS: Lower chest:  Insert lung bases Hepatobiliary: No hepatic lesions or intrahepatic biliary dilatation. The gallbladder contains layering high attenuation material which is likely vicarious excretion of contrast from the prior CT scan. There was no evidence of sludge or stones on the prior CT. No common bile duct dilatation. Pancreas: No mass, inflammation or ductal dilatation. Spleen: Normal size.  No focal lesions. Adrenals/Urinary Tract: Adrenal glands and kidneys are normal. No renal lesions, renal calculi or hydronephrosis. The bladder is unremarkable. Stomach/Bowel: The stomach, duodenum and small bowel are well distended. No areas of abnormal mucosal enhancement to suggest inflammatory bowel disease or Crohn's disease. The terminal ileum is unremarkable. The appendix is normal. There is moderate fluid in the cecum. No inflammatory changes. Scattered colonic diverticulosis, most notable in the sigmoid colon but no findings for acute diverticulitis. Vascular/Lymphatic: Scattered distal aortic calcifications but no aneurysm or dissection. The branch vessels are patent. The major  venous structures are patent. The inflammatory changes in the root of the small bowel mesentery seen on the prior CT scan have resolved. There are some residual scattered borderline mesenteric lymph nodes. Findings suggest resolving mesenteric adenitis. No retroperitoneal mass or adenopathy. Reproductive: The uterus and ovaries are  unremarkable. Bilateral tubal ligation clips are noted. Other: No free pelvic fluid collections or pelvic adenopathy the. No abdominal wall hernia. Musculoskeletal: No significant bony findings. Advanced degenerative changes involving the spine. IMPRESSION: 1. No CT findings to suggest inflammatory bowel disease or Crohn's disease. 2. Interval resolution of inflammatory changes in the root of the small bowel mesentery with some residual scattered borderline mesenteric lymph nodes. Findings suggest resolving mesenteric adenitis. 3. Colonic diverticulosis without findings for acute diverticulitis. Aortic Atherosclerosis (ICD10-I70.0). Electronically Signed   By: Marijo Sanes M.D.   On: 06/23/2022 16:07       Assessment / Plan:    66 y/o female on Simponi and MTX for psoriatic arthrtitis who presented to the hospital with severe persistent diarrhea.  Initial CT scan showing some misty appearance of the root of the mesentery. C diff , GI pathogen panel, stool ova / parasites negative. She denied any recent new meds or sick contacts that proceeded this.   Colonoscopy 7/3, was normal.  Biopsies showing some nonspecific changes which are likely reparative/postinfectious, with some minimally increased intraepithelial lymphocytes, not typical for microscopic colitis.  Follow-up CT enterography was done showing no persistent bowel wall inflammation, mesentery shows changes consistent with resolving mesenteric adenitis.  We have discussed previously that given her acute onset of symptoms, imaging findings, colonoscopy findings, I suspect she very likely had an acute infectious enteritis with perhaps some postinfectious dysmotility.  We had also discussed that acute onset microscopic colitis is possible but we have a very unusual to present like this.  She has had some leukocytosis of unclear etiology, but based off symptoms I am concerned she probably has a UTI at this point and would consider empiric therapy for  this.  I will discuss this with her primary Dr. Sherral Hammers.  We have held off on empiric steroids or budesonide for microscopic colitis given this appears less likely, she has responded to measures with Lomotil/Colestid so far, and her leukocytosis.  I have discussed her case with her primary GI Dr. Loletha Carrow who agreed with my assessment /impression of her case.  Overall her bowel function is much improved on her current regimen, hopefully moving towards discharge soon, however with leukocytosis and her urinary symptoms this needs to be addressed prior to going home.  Cultures pending.  Plan: - continue Lomotil every 4 hours - continue Colestid 2 g twice daily - we will discuss with hospitalist service about possible underlying UTI and starting antibiotics for that. - hope to move toward discharge in the near future. - as above, have held off on budesonide / empiric steroids thus far given microscopic colitis seems less likely, other measures have helped her diarrhea, and leukocytosis - I think it would be beneficial for the patient to discuss her hospital course with a [facility] representative/administrator to give feedback on her experience given discussion I had with her today.  Jolly Mango, MD [facility]

## 2022-06-25 NOTE — Plan of Care (Signed)

## 2022-06-25 NOTE — Progress Notes (Signed)
PHARMACY - PHYSICIAN COMMUNICATION CRITICAL VALUE ALERT - BLOOD CULTURE IDENTIFICATION (BCID)  Melissa Middleton is an 66 y.o. female who presented to [facility] on 06/17/2022 with a chief complaint of nausea/vomiting.  Assessment:  BCID + Staph species in 1 out of 4 bottles.  No methicillin detected.  WBC ~ 13 and patient on Cipro for possible UTI.  Patient currently afebrile.  Single blood culture likely contaminant.  Name of physician (or Provider) ContactedLorra Hals, FNP  Current antibiotics: Cipro  Changes to prescribed antibiotics recommended:  Patient is on recommended antibiotics - No changes needed  Results for orders placed or performed during the hospital encounter of 06/17/22  Blood Culture ID Panel (Reflexed) (Collected: 06/24/2022  6:52 PM)  Result Value Ref Range   Enterococcus faecalis NOT DETECTED NOT DETECTED   Enterococcus Faecium NOT DETECTED NOT DETECTED   Listeria monocytogenes NOT DETECTED NOT DETECTED   Staphylococcus species DETECTED (A) NOT DETECTED   Staphylococcus aureus (BCID) NOT DETECTED NOT DETECTED   Staphylococcus epidermidis NOT DETECTED NOT DETECTED   Staphylococcus lugdunensis NOT DETECTED NOT DETECTED   Streptococcus species NOT DETECTED NOT DETECTED   Streptococcus agalactiae NOT DETECTED NOT DETECTED   Streptococcus pneumoniae NOT DETECTED NOT DETECTED   Streptococcus pyogenes NOT DETECTED NOT DETECTED   A.calcoaceticus-baumannii NOT DETECTED NOT DETECTED   Bacteroides fragilis NOT DETECTED NOT DETECTED   Enterobacterales NOT DETECTED NOT DETECTED   Enterobacter cloacae complex NOT DETECTED NOT DETECTED   Escherichia coli NOT DETECTED NOT DETECTED   Klebsiella aerogenes NOT DETECTED NOT DETECTED   Klebsiella oxytoca NOT DETECTED NOT DETECTED   Klebsiella pneumoniae NOT DETECTED NOT DETECTED   Proteus species NOT DETECTED NOT DETECTED   Salmonella species NOT DETECTED NOT DETECTED   Serratia marcescens NOT DETECTED NOT DETECTED   Haemophilus  influenzae NOT DETECTED NOT DETECTED   Neisseria meningitidis NOT DETECTED NOT DETECTED   Pseudomonas aeruginosa NOT DETECTED NOT DETECTED   Stenotrophomonas maltophilia NOT DETECTED NOT DETECTED   Candida albicans NOT DETECTED NOT DETECTED   Candida auris NOT DETECTED NOT DETECTED   Candida glabrata NOT DETECTED NOT DETECTED   Candida krusei NOT DETECTED NOT DETECTED   Candida parapsilosis NOT DETECTED NOT DETECTED   Candida tropicalis NOT DETECTED NOT DETECTED   Cryptococcus neoformans/gattii NOT DETECTED NOT DETECTED    Everette Rank, PharmD 06/25/2022  10:33 PM

## 2022-06-26 DIAGNOSIS — K529 Noninfective gastroenteritis and colitis, unspecified: Secondary | ICD-10-CM | POA: Diagnosis not present

## 2022-06-26 DIAGNOSIS — R197 Diarrhea, unspecified: Secondary | ICD-10-CM | POA: Diagnosis not present

## 2022-06-26 DIAGNOSIS — F419 Anxiety disorder, unspecified: Secondary | ICD-10-CM | POA: Diagnosis not present

## 2022-06-26 DIAGNOSIS — R7881 Bacteremia: Secondary | ICD-10-CM | POA: Diagnosis present

## 2022-06-26 DIAGNOSIS — R112 Nausea with vomiting, unspecified: Secondary | ICD-10-CM | POA: Diagnosis not present

## 2022-06-26 DIAGNOSIS — R159 Full incontinence of feces: Secondary | ICD-10-CM | POA: Diagnosis not present

## 2022-06-26 LAB — CBC WITH DIFFERENTIAL/PLATELET
Abs Immature Granulocytes: 1.45 10*3/uL — ABNORMAL HIGH (ref 0.00–0.07)
Basophils Absolute: 0.1 10*3/uL (ref 0.0–0.1)
Basophils Relative: 0 %
Eosinophils Absolute: 1.9 10*3/uL — ABNORMAL HIGH (ref 0.0–0.5)
Eosinophils Relative: 14 %
HCT: 31.4 % — ABNORMAL LOW (ref 36.0–46.0)
Hemoglobin: 10.2 g/dL — ABNORMAL LOW (ref 12.0–15.0)
Immature Granulocytes: 11 %
Lymphocytes Relative: 23 %
Lymphs Abs: 3.1 10*3/uL (ref 0.7–4.0)
MCH: 30.1 pg (ref 26.0–34.0)
MCHC: 32.5 g/dL (ref 30.0–36.0)
MCV: 92.6 fL (ref 80.0–100.0)
Monocytes Absolute: 0.9 10*3/uL (ref 0.1–1.0)
Monocytes Relative: 6 %
Neutro Abs: 6.3 10*3/uL (ref 1.7–7.7)
Neutrophils Relative %: 46 %
Platelets: 176 10*3/uL (ref 150–400)
RBC: 3.39 MIL/uL — ABNORMAL LOW (ref 3.87–5.11)
RDW: 14.4 % (ref 11.5–15.5)
WBC: 13.7 10*3/uL — ABNORMAL HIGH (ref 4.0–10.5)
nRBC: 0 % (ref 0.0–0.2)

## 2022-06-26 LAB — COMPREHENSIVE METABOLIC PANEL
ALT: 13 U/L (ref 0–44)
AST: 13 U/L — ABNORMAL LOW (ref 15–41)
Albumin: 2.2 g/dL — ABNORMAL LOW (ref 3.5–5.0)
Alkaline Phosphatase: 59 U/L (ref 38–126)
Anion gap: 5 (ref 5–15)
BUN: 8 mg/dL (ref 8–23)
CO2: 27 mmol/L (ref 22–32)
Calcium: 8.1 mg/dL — ABNORMAL LOW (ref 8.9–10.3)
Chloride: 108 mmol/L (ref 98–111)
Creatinine, Ser: 0.64 mg/dL (ref 0.44–1.00)
GFR, Estimated: >60 mL/min (ref >60)
Glucose, Bld: 117 mg/dL — ABNORMAL HIGH (ref 70–99)
Potassium: 4 mmol/L (ref 3.5–5.1)
Sodium: 140 mmol/L (ref 135–145)
Total Bilirubin: 0.4 mg/dL (ref 0.3–1.2)
Total Protein: 5.1 g/dL — ABNORMAL LOW (ref 6.5–8.1)

## 2022-06-26 LAB — MAGNESIUM: Magnesium: 2 mg/dL (ref 1.7–2.4)

## 2022-06-26 LAB — PHOSPHORUS: Phosphorus: 3.1 mg/dL (ref 2.5–4.6)

## 2022-06-26 NOTE — Progress Notes (Signed)
PROGRESS NOTE    Melissa Middleton  QPR:[id number] DOB: [birth year] DOA: 06/17/2022 PCP: Shelda Pal, DO     Brief Narrative:  66 year old WF PMHx GERD, anxiety, depression, insomnia, Nissen fundoplication, seasonal allergies, osteoarthritis, and psoriasis on methotrexate   Presented with nausea, vomiting, fever with worsening diarrhea.  On presentation, she was febrile to 100.5, tachycardic. CT abdomen and pelvis with nonspecific misty appearance of root of the abdominal mesentery which could be seen in the setting of mesenteric adenitis, colonic diverticulosis without diverticulitis and moderate to large liquid stool burden without evidence of enteric obstruction.  She was started on IV fluids.  GI was subsequently consulted and she underwent colonoscopy on 06/20/2022.   Subjective: 7/9 afebrile overnight, A/O x4.  Negative abdominal pain.  Negative nausea.  States stool now beginning to form   Assessment & Plan: Covid vaccination;   Principal Problem:   Intractable nausea and vomiting Active Problems:   INSOMNIA, CHRONIC   Anxiety and depression   Gastroesophageal reflux disease without esophagitis   Moderate persistent asthma   OAB (overactive bladder)   Psoriatic arthritis (HCC)   S/P Nissen fundoplication (without gastrostomy tube) procedure   Diarrhea   Gastroenteritis   Hypokalemia   Metabolic acidosis   Acute gastroenteritis   Incontinence of feces  Possible acute gastroenteritis presenting with nausea/vomiting/diarrhea and fever -Still having significant diarrhea and has a rectal tube currently. -Stool testing negative for C. difficile and GI PCR.   -GI was consulted on 06/20/2022 because of significant diarrhea.  She underwent colonoscopy on 06/20/2022 to evaluate for microscopic colitis: Showed diverticulosis in the left colon and the right colon.   -7/5 discussed case with Dr. Zachery Conch GI documents patient pathology NOT fully consistent with microscopic  colitis   -7/5 patient's WBC increased overnight.Dr. Zachery Conch GI may order CT enterography if patient is negative for UTI. -Colestid 2 g daily - Lomotil every 4 hours -7/5 TSH = 1.0   Leukocytosis - Multifactorial, presumed UTI, reactive from diarrhea and CTE - See UTI  Bacteremia positive staph hemolyticus  - 7/9, 1/4 bottles most likely contaminant, no action required most likely contaminant -7/9 repeat blood cultures pending  UTI positive E. coli - 7/8 empiric coverage with ciprofloxacin 500 mg BID, allergy to cephalosporin  Non anion gap metabolic acidosis -Due to diarrhea.   -7/5 sodium bicarb 75 ml/hr -Resolved - 7/6 normal saline64m/hr   Anxiety/depression/insomnia -Xanax 0.25 mg TID -Trazodone 100 mg qhs.   Hypokalemia -Potassium goal> 4 - 7/7 K-Dur 30 mEq BID   Hypophosphatemia -Phosphorus goal> 2.5  Hypomagnesmia - Magnesium goal> 2 - 7/7 Magnesium IV 2 g   History of psoriasis -Methotrexate on hold.  Outpatient follow-up with rheumatology.      Mobility Assessment (last 72 hours)     Mobility Assessment     Row Name 06/26/22 [id number]/08/23 2101 06/25/22 0940 06/24/22 1954 06/23/22 2017   Does patient have an order for bedrest or is patient medically unstable No - Continue assessment No - Continue assessment No - Continue assessment No - Continue assessment No - Continue assessment   What is the highest level of mobility based on the progressive mobility assessment? Level 6 (Walks independently in room and hall) - Balance while walking in room without assist - Complete Level 6 (Walks independently in room and hall) - Balance while walking in room without assist - Complete Level 6 (Walks independently in room and hall) - Balance while walking in room without assist - Complete Level  6 (Walks independently in room and hall) - Balance while walking in room without assist - Complete Level 5 (Walks with assist in room/hall) - Balance while stepping  forward/back and can walk in room with assist - Complete                  DVT prophylaxis: Lovenox Code Status: Full Family Communication: 7/9 spoke with daughter over phone discussed plan of care all questions answered Status is: Inpatient    Dispo: The patient is from: Home              Anticipated d/c is to: Home              Anticipated d/c date is: 2 days              Patient currently is not medically stable to d/c.      Consultants:  GI  Procedures/Significant Events:    I have personally reviewed and interpreted all radiology studies and my findings are as above.  VENTILATOR SETTINGS:    Cultures 6/30 GI panel negative 7/1 C. difficile antigen negative, C. difficile toxin negative 7/7 blood positive staph hemolyticus 1/4 bottles most likely contaminant 7/7 urine positive E. coli   Antimicrobials: Anti-infectives (From admission, onward)    Start     Ordered Stop   06/18/22 1700  azithromycin (ZITHROMAX) tablet 500 mg  Status:  Discontinued        06/18/22 0857 06/19/22 0935   06/17/22 1715  ciprofloxacin (CIPRO) tablet 500 mg        06/17/22 1714 06/17/22 1721         Devices    LINES / TUBES:      Continuous Infusions:  sodium chloride 75 mL/hr at 06/26/22 0125     Objective: Vitals:   06/25/22 1800 06/25/22 2101 06/26/22 0500 06/26/22 0600  BP:  117/62  128/66  Pulse:  75  (!) 59  Resp:  16  18  Temp:  98.3 F (36.8 C)  98.9 F (37.2 C)  TempSrc:  Oral  Oral  SpO2:  100%  98%  Weight: 79.8 kg  83.4 kg   Height: '5\' 5"'$  (1.651 m)       Intake/Output Summary (Last 24 hours) at 06/26/2022 1128 Last data filed at 06/25/2022 2107 Gross per 24 hour  Intake 240 ml  Output 200 ml  Net 40 ml    Filed Weights   06/25/22 0500 06/25/22 1800 06/26/22 0500  Weight: 84.5 kg 79.8 kg 83.4 kg    Examination:  General: A/O x4, No acute respiratory distress Eyes: negative scleral hemorrhage, negative anisocoria, negative  icterus ENT: Negative Runny nose, negative gingival bleeding, Neck:  Negative scars, masses, torticollis, lymphadenopathy, JVD Lungs: Clear to auscultation bilaterally without wheezes or crackles Cardiovascular: Regular rate and rhythm without murmur gallop or rub normal S1 and S2 Abdomen: negative abdominal pain, nondistended, positive soft, bowel sounds, no rebound, no ascites, no appreciable mass,  Extremities: No significant cyanosis, clubbing, or edema bilateral lower extremities Skin: Negative rashes, lesions, ulcers Psychiatric:  Negative depression, positive anxiety, negative fatigue, negative mania  Central nervous system:  Cranial nerves II through XII intact, tongue/uvula midline, all extremities muscle strength 5/5, sensation intact throughout, negative dysarthria, negative expressive aphasia, negative receptive aphasia.  .     Data Reviewed: Care during the described time interval was provided by me .  I have reviewed this patient's available data, including medical history, events of note, physical examination, and  all test results as part of my evaluation.  CBC: Recent Labs  Lab 06/22/22 0816 06/23/22 0613 06/24/22 0628 06/25/22 0455 06/26/22 0546  WBC 14.5* 11.9* 15.5* 13.9* 13.7*  NEUTROABS 7.0 6.0 7.9* 7.3 6.3  HGB 13.0 12.1 10.7* 10.4* 10.2*  HCT 39.6 36.6 32.8* 31.7* 31.4*  MCV 93.2 91.7 91.1 91.9 92.6  PLT 233 221 185 167 008    Basic Metabolic Panel: Recent Labs  Lab 06/22/22 0520 06/22/22 0816 06/23/22 0613 06/24/22 0628 06/25/22 0455 06/26/22 0546  NA 139  --  138 140 137 140  K 4.1  --  3.6 2.9* 3.6 4.0  CL 118*  --  103 105 109 108  CO2 16*  --  '28 28 24 27  '$ GLUCOSE 108*  --  106* 102* 106* 117*  BUN <5*  --  <5* '8 8 8  '$ CREATININE 0.72  --  0.64 0.69 0.59 0.64  CALCIUM 8.6*  --  8.3* 8.0* 7.7* 8.1*  MG 1.9 1.9 1.7 1.6* 2.0 2.0  PHOS  --  2.6 3.5 4.6 3.7 3.1    GFR: Estimated Creatinine Clearance: 73.8 mL/min (by C-G formula based on SCr  of 0.64 mg/dL). Liver Function Tests: Recent Labs  Lab 06/23/22 0613 06/24/22 0628 06/25/22 0455 06/26/22 0546  AST 9* 12* 12* 13*  ALT '9 12 12 13  '$ ALKPHOS 57 57 53 59  BILITOT 0.5 0.6 0.3 0.4  PROT 5.4* 5.0* 4.7* 5.1*  ALBUMIN 2.6* 2.2* 2.1* 2.2*    No results for input(s): "LIPASE", "AMYLASE" in the last 168 hours.  No results for input(s): "AMMONIA" in the last 168 hours. Coagulation Profile: No results for input(s): "INR", "PROTIME" in the last 168 hours. Cardiac Enzymes: No results for input(s): "CKTOTAL", "CKMB", "CKMBINDEX", "TROPONINI" in the last 168 hours. BNP (last 3 results) No results for input(s): "PROBNP" in the last 8760 hours. HbA1C: No results for input(s): "HGBA1C" in the last 72 hours. CBG: No results for input(s): "GLUCAP" in the last 168 hours. Lipid Profile: No results for input(s): "CHOL", "HDL", "LDLCALC", "TRIG", "CHOLHDL", "LDLDIRECT" in the last 72 hours. Thyroid Function Tests: No results for input(s): "TSH", "T4TOTAL", "FREET4", "T3FREE", "THYROIDAB" in the last 72 hours.  Anemia Panel: No results for input(s): "VITAMINB12", "FOLATE", "FERRITIN", "TIBC", "IRON", "RETICCTPCT" in the last 72 hours. Sepsis Labs: No results for input(s): "PROCALCITON", "LATICACIDVEN" in the last 168 hours.   Recent Results (from the past 240 hour(s))  Clostridium Difficile by PCR     Status: None   Collection Time: 06/17/22  8:08 AM   Specimen: Stool   Stool  Result Value Ref Range Status   Toxigenic C. Difficile by PCR Negative Negative Final  Ova and parasite examination     Status: None   Collection Time: 06/17/22  8:08 AM   Specimen: Stool  Result Value Ref Range Status   MICRO NUMBER: [id number]  Final   SPECIMEN QUALITY: Adequate  Final   Source STOOL  Final   STATUS: FINAL  Final   CONCENTRATE RESULT: No ova or parasites seen  Final   TRICHROME RESULT: No ova or parasites seen  Final   COMMENT:   Final    Routine Ova and Parasite exam may not  detect some parasites that occasionally cause diarrheal illness. Cryptosporidium Antigen and/or Cyclospora and Isospora Exam may be ordered to detect these parasites. One negative sample does not necessarily rule out  the presence of a parasitic infection.  For additional information, please refer to https://education.[facility].com/faq/FAQ203 (  This link is being provided for informational/ educational purposes only.)   Stool Culture     Status: None   Collection Time: 06/17/22  8:08 AM   Specimen: Stool   Stool  Result Value Ref Range Status   Salmonella/Shigella Screen Final report  Final   Stool Culture result 1 (RSASHR) Comment  Final    Comment: No Salmonella or Shigella recovered.   Campylobacter Culture Final report  Final   Stool Culture result 1 (CMPCXR) Comment  Final    Comment: No Campylobacter species isolated.   E coli, Shiga toxin Assay Negative Negative Final  Blood culture (routine x 2)     Status: None   Collection Time: 06/17/22 12:49 PM   Specimen: Right Antecubital; Blood  Result Value Ref Range Status   Specimen Description   Final    RIGHT ANTECUBITAL BLOOD Performed at [facility], [address] [place], [place] [address]    Special Requests   Final    Blood Culture adequate volume BOTTLES DRAWN AEROBIC AND ANAEROBIC Performed at [facility], [address], [place], [place] [address]    Culture   Final    NO GROWTH 5 DAYS Performed at [facility], [place] [address] [place], [place] [address]    Report Status 06/22/2022 FINAL  Final  Blood culture (routine x 2)     Status: None   Collection Time: 06/17/22  2:01 PM   Specimen: Left Antecubital; Blood  Result Value Ref Range Status   Specimen Description   Final    LEFT ANTECUBITAL BLOOD Performed at [facility], [address] [place], [place] [address]    Special Requests   Final    Blood Culture adequate volume BOTTLES DRAWN AEROBIC AND  ANAEROBIC Performed at [facility], [address], [place], [place] [address]    Culture   Final    NO GROWTH 5 DAYS Performed at [facility], [place] [address], [place], [place] [address]    Report Status 06/22/2022 FINAL  Final  Gastrointestinal Panel by PCR , Stool     Status: None   Collection Time: 06/17/22  5:01 PM   Specimen: Stool  Result Value Ref Range Status   Campylobacter species NOT DETECTED NOT DETECTED Final   Plesimonas shigelloides NOT DETECTED NOT DETECTED Final   Salmonella species NOT DETECTED NOT DETECTED Final   Yersinia enterocolitica NOT DETECTED NOT DETECTED Final   Vibrio species NOT DETECTED NOT DETECTED Final   Vibrio cholerae NOT DETECTED NOT DETECTED Final   Enteroaggregative E coli (EAEC) NOT DETECTED NOT DETECTED Final   Enteropathogenic E coli (EPEC) NOT DETECTED NOT DETECTED Final   Enterotoxigenic E coli (ETEC) NOT DETECTED NOT DETECTED Final   Shiga like toxin producing E coli (STEC) NOT DETECTED NOT DETECTED Final   Shigella/Enteroinvasive E coli (EIEC) NOT DETECTED NOT DETECTED Final   Cryptosporidium NOT DETECTED NOT DETECTED Final   Cyclospora cayetanensis NOT DETECTED NOT DETECTED Final   Entamoeba histolytica NOT DETECTED NOT DETECTED Final   Giardia lamblia NOT DETECTED NOT DETECTED Final   Adenovirus F40/41 NOT DETECTED NOT DETECTED Final   Astrovirus NOT DETECTED NOT DETECTED Final   Norovirus GI/GII NOT DETECTED NOT DETECTED Final   Rotavirus A NOT DETECTED NOT DETECTED Final   Sapovirus (I, II, IV, and V) NOT DETECTED NOT DETECTED Final    Comment: Performed at [facility], [address] [place], [place] [address]  C Difficile Quick Screen w PCR reflex     Status: None   Collection Time: 06/18/22  3:42 AM   Specimen: STOOL  Result Value Ref Range Status   C Diff antigen NEGATIVE NEGATIVE Final   C Diff toxin NEGATIVE NEGATIVE Final   C Diff interpretation No C. difficile detected.  Final    Comment:  Performed at [facility], [place] [address], [place], [place] [address]  Urine Culture     Status: Abnormal (Preliminary result)   Collection Time: 06/24/22  6:41 PM   Specimen: Urine, Clean Catch  Result Value Ref Range Status   Specimen Description   Final    URINE, CLEAN CATCH Performed at [facility], [place] [address], [place], [place] [address]    Special Requests   Final    NONE Performed at [facility], [place] [address], [place], [place] [address]    Culture 80,000 COLONIES/mL ESCHERICHIA COLI (A)  Final   Report Status PENDING  Incomplete  Culture, blood (Routine X 2) w Reflex to ID Panel     Status: None (Preliminary result)   Collection Time: 06/24/22  6:52 PM   Specimen: BLOOD  Result Value Ref Range Status   Specimen Description   Final    BLOOD BLOOD RIGHT HAND Performed at [place] [address] [place], [place] [address]    Special Requests   Final    BOTTLES DRAWN AEROBIC AND ANAEROBIC Blood Culture adequate volume Performed at [place] [address], [place], [place] [address]    Culture  Setup Time   Final    GRAM POSITIVE COCCI IN CLUSTERS AEROBIC BOTTLE ONLY CRITICAL RESULT CALLED TO, READ BACK BY AND VERIFIED WITH: L POINDEXTER,PHARMD'@2200'$  06/25/22 [place] Performed at [facility], [place] [address], [place],  [address]    Culture GRAM POSITIVE COCCI  Final   Report Status PENDING  Incomplete  Blood Culture ID Panel (Reflexed)     Status: Abnormal   Collection Time: 06/24/22  6:52 PM  Result Value Ref Range Status   Enterococcus faecalis NOT DETECTED NOT DETECTED Final   Enterococcus Faecium NOT DETECTED NOT DETECTED Final   Listeria monocytogenes NOT DETECTED NOT DETECTED Final   Staphylococcus species DETECTED (A) NOT DETECTED Final    Comment: CRITICAL RESULT CALLED TO, READ BACK BY AND VERIFIED WITH: L POINDEXTER,PHARMD'@2200'$  06/25/22 [place]     Staphylococcus aureus (BCID) NOT DETECTED NOT DETECTED Final   Staphylococcus epidermidis NOT DETECTED NOT DETECTED Final   Staphylococcus lugdunensis NOT DETECTED NOT DETECTED Final   Streptococcus species NOT DETECTED NOT DETECTED Final   Streptococcus agalactiae NOT DETECTED NOT DETECTED Final   Streptococcus pneumoniae NOT DETECTED NOT DETECTED Final   Streptococcus pyogenes NOT DETECTED NOT DETECTED Final   A.calcoaceticus-baumannii NOT DETECTED NOT DETECTED Final   Bacteroides fragilis NOT DETECTED NOT DETECTED Final   Enterobacterales NOT DETECTED NOT DETECTED Final   Enterobacter cloacae complex NOT DETECTED NOT DETECTED Final   Escherichia coli NOT DETECTED NOT DETECTED Final   Klebsiella aerogenes NOT DETECTED NOT DETECTED Final   Klebsiella oxytoca NOT DETECTED NOT DETECTED Final   Klebsiella pneumoniae NOT DETECTED NOT DETECTED Final   Proteus species NOT DETECTED NOT DETECTED Final   Salmonella species NOT DETECTED NOT DETECTED Final   Serratia marcescens NOT DETECTED NOT DETECTED Final   Haemophilus influenzae NOT DETECTED NOT DETECTED Final   Neisseria meningitidis NOT  DETECTED NOT DETECTED Final   Pseudomonas aeruginosa NOT DETECTED NOT DETECTED Final   Stenotrophomonas maltophilia NOT DETECTED NOT DETECTED Final   Candida albicans NOT DETECTED NOT DETECTED Final   Candida auris NOT DETECTED NOT DETECTED Final   Candida glabrata NOT DETECTED NOT DETECTED Final   Candida krusei NOT DETECTED NOT DETECTED Final   Candida parapsilosis NOT DETECTED NOT DETECTED Final   Candida tropicalis NOT DETECTED NOT DETECTED Final   Cryptococcus neoformans/gattii NOT DETECTED NOT DETECTED Final    Comment: Performed at [facility], [place] [address] [place], [place] [address]  Culture, blood (Routine X 2) w Reflex to ID Panel     Status: None (Preliminary result)   Collection Time: 06/24/22  6:54 PM   Specimen: BLOOD  Result Value Ref Range Status   Specimen Description   Final     BLOOD LEFT ANTECUBITAL Performed at [place] [address], [place], [place] [address]    Special Requests   Final    BOTTLES DRAWN AEROBIC AND ANAEROBIC Blood Culture adequate volume Performed at [facility] [address], [place], [place] [address]    Culture   Final    NO GROWTH 2 DAYS Performed at [facility] [place] [address], [place], [place] [address]    Report Status PENDING  Incomplete         Radiology Studies: No results found.      Scheduled Meds:  ciprofloxacin  500 mg Oral BID   colestipol  2 g Oral 2 times per day   diphenoxylate-atropine  1 tablet Oral Q4H   enoxaparin (LOVENOX) injection  40 mg Subcutaneous Daily   loratadine  10 mg Oral Daily   montelukast  10 mg Oral QHS   potassium chloride  30 mEq Oral BID   traZODone  100 mg Oral QHS   Continuous Infusions:  sodium chloride 75 mL/hr at 06/26/22 0125     LOS: 7 days    Time spent:40 min    Danette Weinfeld, Geraldo Docker, MD [facility]   If 7PM-7AM, please contact night-coverage 06/26/2022, 11:28 AM

## 2022-06-26 NOTE — Progress Notes (Signed)
Progress Note   Subjective  Patient a bit anxious this AM about her lab results. Being treated for UTI with cipro yesterday and her dysuria has resolved. Her diarrhea is much better controlled. No BMs overnight. 1 out of 4 bottles positive for Staph, suspected to be possible contaminant.   Objective   Vital signs in last 24 hours: Temp:  [98.3 F (36.8 C)-98.9 F (37.2 C)] 98.9 F (37.2 C) (07/09 0600) Pulse Rate:  [59-86] 59 (07/09 0600) Resp:  [16-18] 18 (07/09 0600) BP: (109-128)/(61-66) 128/66 (07/09 0600) SpO2:  [97 %-100 %] 98 % (07/09 0600) Weight:  [79.8 kg-83.4 kg] 83.4 kg (07/09 0500) Last BM Date : 06/25/22 General:    white female in NAD Neurologic:  Alert and oriented,  grossly normal neurologically. Psych:  Cooperative. Normal mood and affect.  Intake/Output from previous day: 07/08 0701 - 07/09 0700 In: 240 [P.O.:240] Out: 200 [Urine:200] Intake/Output this shift: No intake/output data recorded.  Lab Results: Recent Labs    06/24/22 0628 06/25/22 0455 06/26/22 0546  WBC 15.5* 13.9* 13.7*  HGB 10.7* 10.4* 10.2*  HCT 32.8* 31.7* 31.4*  PLT 185 167 176   BMET Recent Labs    06/24/22 0628 06/25/22 0455 06/26/22 0546  NA 140 137 140  K 2.9* 3.6 4.0  CL 105 109 108  CO2 '28 24 27  '$ GLUCOSE 102* 106* 117*  BUN '8 8 8  '$ CREATININE 0.69 0.59 0.64  CALCIUM 8.0* 7.7* 8.1*   LFT Recent Labs    06/26/22 0546  PROT 5.1*  ALBUMIN 2.2*  AST 13*  ALT 13  ALKPHOS 59  BILITOT 0.4   PT/INR No results for input(s): "LABPROT", "INR" in the last 72 hours.  Studies/Results: No results found.     Assessment / Plan:    66 y/o female on Simponi and MTX for psoriatic arthrtitis who presented to the hospital with severe persistent diarrhea.  Initial CT scan showing some misty appearance of the root of the mesentery. C diff , GI pathogen panel, stool ova / parasites negative. She denied any recent new meds or sick contacts that proceeded this.    Colonoscopy 7/3, was normal.  Biopsies showing some nonspecific changes which are likely reparative/postinfectious, with some minimally increased intraepithelial lymphocytes, not typical for microscopic colitis.  Follow-up CT enterography was done showing no persistent bowel wall inflammation, mesentery shows changes consistent with resolving mesenteric adenitis.  We have discussed previously that given her acute onset of symptoms, imaging findings, colonoscopy findings, I suspect she very likely had an acute infectious enteritis with perhaps some postinfectious dysmotility.  We had also discussed that acute onset microscopic colitis is possible but we have a very unusual to present like this.    Of note she has had some leukocytosis without clear cause on CT. Initial UA not too impressive but developed dysuria and on culture clearly has a UTI.  Treated with Cipro starting yesterday and her symptoms are markedly improved.  1 of 4 blood cultures returned positive for staph infection, this may likely be a contaminant but defer to Dr. Sherral Hammers.  Repeat cultures were obtained.  In general her diarrhea is much better on Lomotil and Colestid, she will continue this upon discharge and hopefully as her bowel function improves can wean off of this.  I will coordinate some close follow-up in our clinic in the next 2 weeks or so.  Hopefully she is getting near discharge.  Not much else to add at  this point, we will sign off.    Plan: - continue Lomotil every 4 hours - continue Colestid 2 g twice daily - antibiotics for UTI per primary service - as above, have held off on budesonide / empiric steroids thus far given microscopic colitis seems less likely, other measures have helped her diarrhea, and leukocytosis - office follow up in the next 1-2 weeks with Dr. Loletha Carrow, we will coordinate.  We will sign off for now, please call with questions moving forward.  Jolly Mango, MD [facility]

## 2022-06-27 ENCOUNTER — Telehealth: Payer: Self-pay

## 2022-06-27 DIAGNOSIS — R197 Diarrhea, unspecified: Secondary | ICD-10-CM | POA: Diagnosis not present

## 2022-06-27 DIAGNOSIS — F419 Anxiety disorder, unspecified: Secondary | ICD-10-CM | POA: Diagnosis not present

## 2022-06-27 DIAGNOSIS — R112 Nausea with vomiting, unspecified: Secondary | ICD-10-CM | POA: Diagnosis not present

## 2022-06-27 DIAGNOSIS — K529 Noninfective gastroenteritis and colitis, unspecified: Secondary | ICD-10-CM | POA: Diagnosis not present

## 2022-06-27 LAB — CBC WITH DIFFERENTIAL/PLATELET
Abs Immature Granulocytes: 1.15 10*3/uL — ABNORMAL HIGH (ref 0.00–0.07)
Basophils Absolute: 0.2 10*3/uL — ABNORMAL HIGH (ref 0.0–0.1)
Basophils Relative: 1 %
Eosinophils Absolute: 2.8 10*3/uL — ABNORMAL HIGH (ref 0.0–0.5)
Eosinophils Relative: 20 %
HCT: 32.5 % — ABNORMAL LOW (ref 36.0–46.0)
Hemoglobin: 10.4 g/dL — ABNORMAL LOW (ref 12.0–15.0)
Immature Granulocytes: 8 %
Lymphocytes Relative: 21 %
Lymphs Abs: 3 10*3/uL (ref 0.7–4.0)
MCH: 31 pg (ref 26.0–34.0)
MCHC: 32 g/dL (ref 30.0–36.0)
MCV: 96.7 fL (ref 80.0–100.0)
Monocytes Absolute: 0.8 10*3/uL (ref 0.1–1.0)
Monocytes Relative: 6 %
Neutro Abs: 6.3 10*3/uL (ref 1.7–7.7)
Neutrophils Relative %: 44 %
Platelets: 181 10*3/uL (ref 150–400)
RBC: 3.36 MIL/uL — ABNORMAL LOW (ref 3.87–5.11)
RDW: 14.5 % (ref 11.5–15.5)
WBC: 14.3 10*3/uL — ABNORMAL HIGH (ref 4.0–10.5)
nRBC: 0.1 % (ref 0.0–0.2)

## 2022-06-27 LAB — COMPREHENSIVE METABOLIC PANEL
ALT: 12 U/L (ref 0–44)
AST: 15 U/L (ref 15–41)
Albumin: 2.3 g/dL — ABNORMAL LOW (ref 3.5–5.0)
Alkaline Phosphatase: 54 U/L (ref 38–126)
Anion gap: 5 (ref 5–15)
BUN: 10 mg/dL (ref 8–23)
CO2: 27 mmol/L (ref 22–32)
Calcium: 8.4 mg/dL — ABNORMAL LOW (ref 8.9–10.3)
Chloride: 108 mmol/L (ref 98–111)
Creatinine, Ser: 0.81 mg/dL (ref 0.44–1.00)
GFR, Estimated: >60 mL/min (ref >60)
Glucose, Bld: 100 mg/dL — ABNORMAL HIGH (ref 70–99)
Potassium: 4.5 mmol/L (ref 3.5–5.1)
Sodium: 140 mmol/L (ref 135–145)
Total Bilirubin: 0.5 mg/dL (ref 0.3–1.2)
Total Protein: 5.1 g/dL — ABNORMAL LOW (ref 6.5–8.1)

## 2022-06-27 LAB — PHOSPHORUS: Phosphorus: 3.8 mg/dL (ref 2.5–4.6)

## 2022-06-27 LAB — CULTURE, BLOOD (ROUTINE X 2): Special Requests: ADEQUATE

## 2022-06-27 LAB — URINE CULTURE: Culture: 80000 — AB

## 2022-06-27 LAB — MAGNESIUM: Magnesium: 2.1 mg/dL (ref 1.7–2.4)

## 2022-06-27 MED ORDER — CEFAZOLIN SODIUM-DEXTROSE 1-4 GM/50ML-% IV SOLN
1.0000 g | Freq: Three times a day (TID) | INTRAVENOUS | Status: DC
  Administered 2022-06-27 – 2022-06-28 (×3): 1 g via INTRAVENOUS
  Filled 2022-06-27 (×4): qty 50

## 2022-06-27 NOTE — Telephone Encounter (Signed)
-----   Message from Yetta Flock, MD sent at 06/26/2022 11:24 AM EDT ----- Regarding: [facility] follow up [facility] this patient will need post hospital follow up with Dr. Loletha Carrow or APP in the next 2 weeks or so, if you can help coordinate. Thanks!!  Dr. Loni Muse

## 2022-06-27 NOTE — Plan of Care (Signed)

## 2022-06-27 NOTE — Telephone Encounter (Signed)
Pt already has an appt on Thursday, 07/28/22 at 8:40 am with Dr. Loletha Carrow. Will sent appt information and will mail a copy as well.

## 2022-06-27 NOTE — Progress Notes (Signed)
PROGRESS NOTE    DENIELLE BAYARD  BJY:[id number] DOB: 03-Aug-1956 DOA: 06/17/2022 PCP: Shelda Pal, DO     Brief Narrative:  66 year old WF PMHx GERD, anxiety, depression, insomnia, Nissen fundoplication, seasonal allergies, osteoarthritis, and psoriasis on methotrexate   Presented with nausea, vomiting, fever with worsening diarrhea.  On presentation, she was febrile to 100.5, tachycardic. CT abdomen and pelvis with nonspecific misty appearance of root of the abdominal mesentery which could be seen in the setting of mesenteric adenitis, colonic diverticulosis without diverticulitis and moderate to large liquid stool burden without evidence of enteric obstruction.  She was started on IV fluids.  GI was subsequently consulted and she underwent colonoscopy on 06/20/2022.   Subjective: 7/10 A/O x4, negative abdominal pain, negative nausea.  Stool formed   Assessment & Plan: Covid vaccination;   Principal Problem:   Intractable nausea and vomiting Active Problems:   INSOMNIA, CHRONIC   Anxiety and depression   Gastroesophageal reflux disease without esophagitis   Moderate persistent asthma   OAB (overactive bladder)   Psoriatic arthritis (HCC)   S/P Nissen fundoplication (without gastrostomy tube) procedure   Diarrhea   Gastroenteritis   Hypokalemia   Metabolic acidosis   Acute gastroenteritis   Incontinence of feces   Bacteremia  Possible acute gastroenteritis presenting with nausea/vomiting/diarrhea and fever -Still having significant diarrhea and has a rectal tube currently. -Stool testing negative for C. difficile and GI PCR.   -GI was consulted on 06/20/2022 because of significant diarrhea.  She underwent colonoscopy on 06/20/2022 to evaluate for microscopic colitis: Showed diverticulosis in the left colon and the right colon.   -7/5 discussed case with Dr. Zachery Conch GI documents patient pathology NOT fully consistent with microscopic colitis   -7/5 patient's WBC  increased overnight.Dr. Zachery Conch GI may order CT enterography if patient is negative for UTI. -Colestid 2 g daily - Lomotil every 4 hours -7/5 TSH = 1.0   Leukocytosis - Multifactorial, presumed UTI, reactive from diarrhea and CTE - See UTI  Bacteremia positive staph hemolyticus  - 7/9, 1/4 bottles most likely contaminant, no action required most likely contaminant -7/9 repeat blood cultures pending  UTI positive E. coli - 7/8 empiric coverage with ciprofloxacin 500 mg BID, allergy to cephalosporin -7/10 E. coli resistant to Cipro: Changed to Ancef patient with multiple drug allergies so we will keep patient overnight to ensure she tolerates.  Non anion gap metabolic acidosis -Due to diarrhea.   -7/5 sodium bicarb 75 ml/hr -Resolved - 7/6 normal saline23m/hr   Anxiety/depression/insomnia -Xanax 0.25 mg TID -Trazodone 100 mg qhs.   Hypokalemia -Potassium goal> 4 - 7/7 K-Dur 30 mEq BID   Hypophosphatemia -Phosphorus goal> 2.5  Hypomagnesmia - Magnesium goal> 2 - 7/7 Magnesium IV 2 g   History of psoriasis -Methotrexate on hold.  Outpatient follow-up with rheumatology.      Mobility Assessment (last 72 hours)     Mobility Assessment     Row Name 06/26/22 2238 06/26/22 0928 06/25/22 2101 06/25/22 0940 06/24/22 1954   Does patient have an order for bedrest or is patient medically unstable No - Continue assessment No - Continue assessment No - Continue assessment No - Continue assessment No - Continue assessment   What is the highest level of mobility based on the progressive mobility assessment? Level 6 (Walks independently in room and hall) - Balance while walking in room without assist - Complete Level 6 (Walks independently in room and hall) - Balance while walking in room without assist - Complete  Level 6 (Walks independently in room and hall) - Balance while walking in room without assist - Complete Level 6 (Walks independently in room and hall) - Balance while  walking in room without assist - Complete Level 6 (Walks independently in room and hall) - Balance while walking in room without assist - Complete                  DVT prophylaxis: Lovenox Code Status: Full Family Communication: 7/10 spoke with Husband over phone discussed plan of care all questions answered Status is: Inpatient    Dispo: The patient is from: Home              Anticipated d/c is to: Home              Anticipated d/c date is: 2 days              Patient currently is not medically stable to d/c.      Consultants:  GI  Procedures/Significant Events:    I have personally reviewed and interpreted all radiology studies and my findings are as above.  VENTILATOR SETTINGS:    Cultures 6/30 GI panel negative 7/1 C. difficile antigen negative, C. difficile toxin negative 7/7 blood positive staph hemolyticus 1/4 bottles most likely contaminant 7/7 urine positive E. coli resistant to Cipro   Antimicrobials: Anti-infectives (From admission, onward)    Start     Ordered Stop   06/27/22 1200  ceFAZolin (ANCEF) IVPB 1 g/50 mL premix        06/27/22 1118     06/25/22 1200  ciprofloxacin (CIPRO) tablet 500 mg  Status:  Discontinued        06/25/22 1102 06/27/22 1118   06/18/22 1700  azithromycin (ZITHROMAX) tablet 500 mg  Status:  Discontinued        06/18/22 0857 06/19/22 0935   06/17/22 1715  ciprofloxacin (CIPRO) tablet 500 mg        06/17/22 1714 06/17/22 1721          Devices    LINES / TUBES:      Continuous Infusions:  sodium chloride 75 mL/hr at 06/27/22 0543     Objective: Vitals:   06/26/22 1340 06/26/22 2022 06/27/22 0450 06/27/22 0451  BP: 126/82 (!) 113/57  (!) 106/53  Pulse: 84 75  68  Resp: '18 18  18  '$ Temp: 97.9 F (36.6 C) 98.3 F (36.8 C)  98.2 F (36.8 C)  TempSrc: Oral Oral  Oral  SpO2: 100% 100%  93%  Weight:   83.9 kg   Height:        Intake/Output Summary (Last 24 hours) at 06/27/2022 1114 Last data filed  at 06/27/2022 0935 Gross per 24 hour  Intake 3860.03 ml  Output --  Net 3860.03 ml    Filed Weights   06/25/22 1800 06/26/22 0500 06/27/22 0450  Weight: 79.8 kg 83.4 kg 83.9 kg    Examination:  General: A/O x4, No acute respiratory distress Eyes: negative scleral hemorrhage, negative anisocoria, negative icterus ENT: Negative Runny nose, negative gingival bleeding, Neck:  Negative scars, masses, torticollis, lymphadenopathy, JVD Lungs: Clear to auscultation bilaterally without wheezes or crackles Cardiovascular: Regular rate and rhythm without murmur gallop or rub normal S1 and S2 Abdomen: negative abdominal pain, nondistended, positive soft, bowel sounds, no rebound, no ascites, no appreciable mass,  Extremities: No significant cyanosis, clubbing, or edema bilateral lower extremities Skin: Negative rashes, lesions, ulcers Psychiatric:  Negative depression, positive anxiety, negative fatigue,  negative mania  Central nervous system:  Cranial nerves II through XII intact, tongue/uvula midline, all extremities muscle strength 5/5, sensation intact throughout, negative dysarthria, negative expressive aphasia, negative receptive aphasia.  .     Data Reviewed: Care during the described time interval was provided by me .  I have reviewed this patient's available data, including medical history, events of note, physical examination, and all test results as part of my evaluation.  CBC: Recent Labs  Lab 06/23/22 0613 06/24/22 0628 06/25/22 0455 06/26/22 0546 06/27/22 0523  WBC 11.9* 15.5* 13.9* 13.7* 14.3*  NEUTROABS 6.0 7.9* 7.3 6.3 6.3  HGB 12.1 10.7* 10.4* 10.2* 10.4*  HCT 36.6 32.8* 31.7* 31.4* 32.5*  MCV 91.7 91.1 91.9 92.6 96.7  PLT 221 185 167 176 170    Basic Metabolic Panel: Recent Labs  Lab 06/23/22 0613 06/24/22 0628 06/25/22 0455 06/26/22 0546 06/27/22 0523  NA 138 140 137 140 140  K 3.6 2.9* 3.6 4.0 4.5  CL 103 105 109 108 108  CO2 '28 28 24 27 27  '$ GLUCOSE  106* 102* 106* 117* 100*  BUN <5* '8 8 8 10  '$ CREATININE 0.64 0.69 0.59 0.64 0.81  CALCIUM 8.3* 8.0* 7.7* 8.1* 8.4*  MG 1.7 1.6* 2.0 2.0 2.1  PHOS 3.5 4.6 3.7 3.1 3.8    GFR: Estimated Creatinine Clearance: 73.1 mL/min (by C-G formula based on SCr of 0.81 mg/dL). Liver Function Tests: Recent Labs  Lab 06/23/22 0613 06/24/22 0628 06/25/22 0455 06/26/22 0546 06/27/22 0523  AST 9* 12* 12* 13* 15  ALT '9 12 12 13 12  '$ ALKPHOS 57 57 53 59 54  BILITOT 0.5 0.6 0.3 0.4 0.5  PROT 5.4* 5.0* 4.7* 5.1* 5.1*  ALBUMIN 2.6* 2.2* 2.1* 2.2* 2.3*    No results for input(s): "LIPASE", "AMYLASE" in the last 168 hours.  No results for input(s): "AMMONIA" in the last 168 hours. Coagulation Profile: No results for input(s): "INR", "PROTIME" in the last 168 hours. Cardiac Enzymes: No results for input(s): "CKTOTAL", "CKMB", "CKMBINDEX", "TROPONINI" in the last 168 hours. BNP (last 3 results) No results for input(s): "PROBNP" in the last 8760 hours. HbA1C: No results for input(s): "HGBA1C" in the last 72 hours. CBG: No results for input(s): "GLUCAP" in the last 168 hours. Lipid Profile: No results for input(s): "CHOL", "HDL", "LDLCALC", "TRIG", "CHOLHDL", "LDLDIRECT" in the last 72 hours. Thyroid Function Tests: No results for input(s): "TSH", "T4TOTAL", "FREET4", "T3FREE", "THYROIDAB" in the last 72 hours.  Anemia Panel: No results for input(s): "VITAMINB12", "FOLATE", "FERRITIN", "TIBC", "IRON", "RETICCTPCT" in the last 72 hours. Sepsis Labs: No results for input(s): "PROCALCITON", "LATICACIDVEN" in the last 168 hours.   Recent Results (from the past 240 hour(s))  Blood culture (routine x 2)     Status: None   Collection Time: 06/17/22 12:49 PM   Specimen: Right Antecubital; Blood  Result Value Ref Range Status   Specimen Description   Final    RIGHT ANTECUBITAL BLOOD Performed at [facility], [address] [place], [place] [address]    Special Requests   Final    Blood  Culture adequate volume BOTTLES DRAWN AEROBIC AND ANAEROBIC Performed at [facility], [address] [place], [place] [address]    Culture   Final    NO GROWTH 5 DAYS Performed at [facility], [place] [address] [place], [place] [address]    Report Status 06/22/2022 FINAL  Final  Blood culture (routine x 2)  Status: None   Collection Time: 06/17/22  2:01 PM   Specimen: Left Antecubital; Blood  Result Value Ref Range Status   Specimen Description   Final    LEFT ANTECUBITAL BLOOD Performed at [facility], [address] [place], [place] [address]    Special Requests   Final    Blood Culture adequate volume BOTTLES DRAWN AEROBIC AND ANAEROBIC Performed at [facility], [address], [place], [place] [address]    Culture   Final    NO GROWTH 5 DAYS Performed at [facility], [place] [address], [place], [place] [address]    Report Status 06/22/2022 FINAL  Final  Gastrointestinal Panel by PCR , Stool     Status: None   Collection Time: 06/17/22  5:01 PM   Specimen: Stool  Result Value Ref Range Status   Campylobacter species NOT DETECTED NOT DETECTED Final   Plesimonas shigelloides NOT DETECTED NOT DETECTED Final   Salmonella species NOT DETECTED NOT DETECTED Final   Yersinia enterocolitica NOT DETECTED NOT DETECTED Final   Vibrio species NOT DETECTED NOT DETECTED Final   Vibrio cholerae NOT DETECTED NOT DETECTED Final   Enteroaggregative E coli (EAEC) NOT DETECTED NOT DETECTED Final   Enteropathogenic E coli (EPEC) NOT DETECTED NOT DETECTED Final   Enterotoxigenic E coli (ETEC) NOT DETECTED NOT DETECTED Final   Shiga like toxin producing E coli (STEC) NOT DETECTED NOT DETECTED Final   Shigella/Enteroinvasive E coli (EIEC) NOT DETECTED NOT DETECTED Final   Cryptosporidium NOT DETECTED NOT DETECTED Final   Cyclospora cayetanensis NOT DETECTED NOT DETECTED Final   Entamoeba histolytica NOT DETECTED NOT DETECTED Final   Giardia  lamblia NOT DETECTED NOT DETECTED Final   Adenovirus F40/41 NOT DETECTED NOT DETECTED Final   Astrovirus NOT DETECTED NOT DETECTED Final   Norovirus GI/GII NOT DETECTED NOT DETECTED Final   Rotavirus A NOT DETECTED NOT DETECTED Final   Sapovirus (I, II, IV, and V) NOT DETECTED NOT DETECTED Final    Comment: Performed at [facility], [address] [place], [place] [address]  C Difficile Quick Screen w PCR reflex     Status: None   Collection Time: 06/18/22  3:42 AM   Specimen: STOOL  Result Value Ref Range Status   C Diff antigen NEGATIVE NEGATIVE Final   C Diff toxin NEGATIVE NEGATIVE Final   C Diff interpretation No C. difficile detected.  Final    Comment: Performed at [facility], [place] [address] [place], [place] [address]  Urine Culture     Status: Abnormal   Collection Time: 06/24/22  6:41 PM   Specimen: Urine, Clean Catch  Result Value Ref Range Status   Specimen Description   Final    URINE, CLEAN CATCH Performed at [facility], [place] [address], [place], [place] [address]    Special Requests   Final    NONE Performed at [facility], [place] [address], [place], [place] [address]    Culture 80,000 COLONIES/mL ESCHERICHIA COLI (A)  Final   Report Status 06/27/2022 FINAL  Final   Organism ID, Bacteria ESCHERICHIA COLI (A)  Final      Susceptibility   Escherichia coli - MIC*    AMPICILLIN 8 SENSITIVE Sensitive     CEFAZOLIN <=4 SENSITIVE Sensitive     CEFEPIME <=0.12 SENSITIVE Sensitive     CEFTRIAXONE <=0.25 SENSITIVE Sensitive     CIPROFLOXACIN >=4 RESISTANT Resistant  GENTAMICIN <=1 SENSITIVE Sensitive     IMIPENEM <=0.25 SENSITIVE Sensitive     NITROFURANTOIN <=16 SENSITIVE Sensitive     TRIMETH/SULFA >=320 RESISTANT Resistant     AMPICILLIN/SULBACTAM 4 SENSITIVE Sensitive     PIP/TAZO <=4 SENSITIVE Sensitive     * 80,000 COLONIES/mL ESCHERICHIA COLI  Culture, blood (Routine X 2) w Reflex to ID  Panel     Status: Abnormal   Collection Time: 06/24/22  6:52 PM   Specimen: BLOOD  Result Value Ref Range Status   Specimen Description   Final    BLOOD BLOOD RIGHT HAND Performed at [place] [address], [place], [place] [address]    Special Requests   Final    BOTTLES DRAWN AEROBIC AND ANAEROBIC Blood Culture adequate volume Performed at [place] [address], [place], [place] [address]    Culture  Setup Time   Final    GRAM POSITIVE COCCI IN CLUSTERS AEROBIC BOTTLE ONLY CRITICAL RESULT CALLED TO, READ BACK BY AND VERIFIED WITH: L POINDEXTER,PHARMD'@2200'$  06/25/22 [place]    Culture (A)  Final    STAPHYLOCOCCUS HAEMOLYTICUS THE SIGNIFICANCE OF ISOLATING THIS ORGANISM FROM A SINGLE SET OF BLOOD CULTURES WHEN MULTIPLE SETS ARE DRAWN IS UNCERTAIN. PLEASE NOTIFY THE MICROBIOLOGY DEPARTMENT WITHIN ONE WEEK IF SPECIATION AND SENSITIVITIES ARE REQUIRED. Performed at [facility], [place] [address], [place],  [address]    Report Status 06/27/2022 FINAL  Final  Blood Culture ID Panel (Reflexed)     Status: Abnormal   Collection Time: 06/24/22  6:52 PM  Result Value Ref Range Status   Enterococcus faecalis NOT DETECTED NOT DETECTED Final   Enterococcus Faecium NOT DETECTED NOT DETECTED Final   Listeria monocytogenes NOT DETECTED NOT DETECTED Final   Staphylococcus species DETECTED (A) NOT DETECTED Final    Comment: CRITICAL RESULT CALLED TO, READ BACK BY AND VERIFIED WITH: L POINDEXTER,PHARMD'@2200'$  06/25/22 [place]    Staphylococcus aureus (BCID) NOT DETECTED NOT DETECTED Final   Staphylococcus epidermidis NOT DETECTED NOT DETECTED Final   Staphylococcus lugdunensis NOT DETECTED NOT DETECTED Final   Streptococcus species NOT DETECTED NOT DETECTED Final   Streptococcus agalactiae NOT DETECTED NOT DETECTED Final   Streptococcus pneumoniae NOT DETECTED NOT DETECTED Final   Streptococcus pyogenes NOT DETECTED NOT DETECTED Final    A.calcoaceticus-baumannii NOT DETECTED NOT DETECTED Final   Bacteroides fragilis NOT DETECTED NOT DETECTED Final   Enterobacterales NOT DETECTED NOT DETECTED Final   Enterobacter cloacae complex NOT DETECTED NOT DETECTED Final   Escherichia coli NOT DETECTED NOT DETECTED Final   Klebsiella aerogenes NOT DETECTED NOT DETECTED Final   Klebsiella oxytoca NOT DETECTED NOT DETECTED Final   Klebsiella pneumoniae NOT DETECTED NOT DETECTED Final   Proteus species NOT DETECTED NOT DETECTED Final   Salmonella species NOT DETECTED NOT DETECTED Final   Serratia marcescens NOT DETECTED NOT DETECTED Final   Haemophilus influenzae NOT DETECTED NOT DETECTED Final   Neisseria meningitidis NOT DETECTED NOT DETECTED Final   Pseudomonas aeruginosa NOT DETECTED NOT DETECTED Final   Stenotrophomonas maltophilia NOT DETECTED NOT DETECTED Final   Candida albicans NOT DETECTED NOT DETECTED Final   Candida auris NOT DETECTED NOT DETECTED Final   Candida glabrata NOT DETECTED NOT DETECTED Final   Candida krusei NOT DETECTED NOT DETECTED Final   Candida parapsilosis NOT DETECTED NOT DETECTED Final   Candida tropicalis NOT DETECTED NOT DETECTED Final   Cryptococcus neoformans/gattii NOT DETECTED NOT DETECTED Final    Comment: Performed  at [facility], [place] [address], [place], [place] [address]  Culture, blood (Routine X 2) w Reflex to ID Panel     Status: None (Preliminary result)   Collection Time: 06/24/22  6:54 PM   Specimen: BLOOD  Result Value Ref Range Status   Specimen Description   Final    BLOOD LEFT ANTECUBITAL Performed at [place] [address], [place], [place] [address]    Special Requests   Final    BOTTLES DRAWN AEROBIC AND ANAEROBIC Blood Culture adequate volume Performed at [place] [address] [place], [place] [address]    Culture   Final    NO GROWTH 3 DAYS Performed at [facility], [place] [address] [place], [place]  [address]    Report Status PENDING  Incomplete  Culture, blood (Routine X 2) w Reflex to ID Panel     Status: None (Preliminary result)   Collection Time: 06/26/22 10:59 AM   Specimen: BLOOD  Result Value Ref Range Status   Specimen Description   Final    BLOOD BLOOD RIGHT HAND Performed at [place] [address] [place], [place] [address]    Special Requests   Final    BOTTLES DRAWN AEROBIC AND ANAEROBIC Blood Culture adequate volume Performed at [place] [address] [place], [place] [address]    Culture   Final    NO GROWTH < 24 HOURS Performed at [place] [address], [place], [place] [address]    Report Status PENDING  Incomplete  Culture, blood (Routine X 2) w Reflex to ID Panel     Status: None (Preliminary result)   Collection Time: 06/26/22 10:59 AM   Specimen: BLOOD  Result Value Ref Range Status   Specimen Description   Final    BLOOD BLOOD LEFT HAND Performed at [facility] [place] [address], [place], [place] [address]    Special Requests   Final    BOTTLES DRAWN AEROBIC AND ANAEROBIC Blood Culture adequate volume Performed at [place] [address] [place], [place] [address]    Culture   Final    NO GROWTH < 24 HOURS Performed at [place] [address], [place],  [address]    Report Status PENDING  Incomplete         Radiology Studies: No results found.      Scheduled Meds:  ciprofloxacin  500 mg Oral BID   colestipol  2 g Oral 2 times per day   diphenoxylate-atropine  1 tablet Oral Q4H   enoxaparin (LOVENOX) injection  40 mg Subcutaneous Daily   loratadine  10 mg Oral Daily   montelukast  10 mg Oral QHS   potassium chloride  30 mEq Oral BID   traZODone  100 mg Oral QHS   Continuous Infusions:  sodium chloride 75 mL/hr at 06/27/22 0543     LOS: 8 days    Time spent:40 min    Codi Kertz, Geraldo Docker, MD [facility]   If 7PM-7AM, please contact night-coverage 06/27/2022, 11:14 AM

## 2022-06-28 ENCOUNTER — Telehealth: Payer: Self-pay | Admitting: Gastroenterology

## 2022-06-28 DIAGNOSIS — R1110 Vomiting, unspecified: Secondary | ICD-10-CM

## 2022-06-28 DIAGNOSIS — K529 Noninfective gastroenteritis and colitis, unspecified: Secondary | ICD-10-CM | POA: Diagnosis not present

## 2022-06-28 DIAGNOSIS — R112 Nausea with vomiting, unspecified: Secondary | ICD-10-CM | POA: Diagnosis not present

## 2022-06-28 DIAGNOSIS — J4540 Moderate persistent asthma, uncomplicated: Secondary | ICD-10-CM

## 2022-06-28 DIAGNOSIS — R159 Full incontinence of feces: Secondary | ICD-10-CM

## 2022-06-28 DIAGNOSIS — F419 Anxiety disorder, unspecified: Secondary | ICD-10-CM | POA: Diagnosis not present

## 2022-06-28 LAB — CBC WITH DIFFERENTIAL/PLATELET
Abs Immature Granulocytes: 0.82 10*3/uL — ABNORMAL HIGH (ref 0.00–0.07)
Basophils Absolute: 0.1 10*3/uL (ref 0.0–0.1)
Basophils Relative: 1 %
Eosinophils Absolute: 2.9 10*3/uL — ABNORMAL HIGH (ref 0.0–0.5)
Eosinophils Relative: 21 %
HCT: 32.7 % — ABNORMAL LOW (ref 36.0–46.0)
Hemoglobin: 10.6 g/dL — ABNORMAL LOW (ref 12.0–15.0)
Immature Granulocytes: 6 %
Lymphocytes Relative: 24 %
Lymphs Abs: 3.2 10*3/uL (ref 0.7–4.0)
MCH: 30.5 pg (ref 26.0–34.0)
MCHC: 32.4 g/dL (ref 30.0–36.0)
MCV: 94 fL (ref 80.0–100.0)
Monocytes Absolute: 0.8 10*3/uL (ref 0.1–1.0)
Monocytes Relative: 6 %
Neutro Abs: 5.9 10*3/uL (ref 1.7–7.7)
Neutrophils Relative %: 42 %
Platelets: 189 10*3/uL (ref 150–400)
RBC: 3.48 MIL/uL — ABNORMAL LOW (ref 3.87–5.11)
RDW: 14.6 % (ref 11.5–15.5)
WBC: 13.7 10*3/uL — ABNORMAL HIGH (ref 4.0–10.5)
nRBC: 0 % (ref 0.0–0.2)

## 2022-06-28 LAB — MAGNESIUM: Magnesium: 2 mg/dL (ref 1.7–2.4)

## 2022-06-28 LAB — PHOSPHORUS: Phosphorus: 4.1 mg/dL (ref 2.5–4.6)

## 2022-06-28 MED ORDER — CEPHALEXIN 500 MG PO CAPS
500.0000 mg | ORAL_CAPSULE | Freq: Two times a day (BID) | ORAL | Status: DC
  Administered 2022-06-28: 500 mg via ORAL
  Filled 2022-06-28: qty 1

## 2022-06-28 MED ORDER — DIPHENOXYLATE-ATROPINE 2.5-0.025 MG PO TABS: 1.0000 | ORAL_TABLET | ORAL | 0 refills | Status: DC

## 2022-06-28 MED ORDER — CEPHALEXIN 500 MG PO CAPS: 500.0000 mg | ORAL_CAPSULE | Freq: Two times a day (BID) | ORAL | 0 refills | Status: DC

## 2022-06-28 MED ORDER — COLESTIPOL HCL 1 G PO TABS: 2.0000 g | ORAL_TABLET | Freq: Two times a day (BID) | ORAL | 0 refills | Status: DC

## 2022-06-28 NOTE — Discharge Summary (Signed)
Physician Discharge Summary  Melissa Middleton:[id number] DOB: October 24, 1956 DOA: 06/17/2022  PCP: Shelda Pal, DO  Admit date: 06/17/2022 Discharge date: 06/28/2022  Time spent: 35 minutes  Recommendations for Outpatient Follow-up:   Possible acute gastroenteritis presenting with nausea/vomiting/diarrhea and fever -Still having significant diarrhea and has a rectal tube currently. -Stool testing negative for C. difficile and GI PCR.   -GI was consulted on 06/20/2022 because of significant diarrhea.  She underwent colonoscopy on 06/20/2022 to evaluate for microscopic colitis: Showed diverticulosis in the left colon and the right colon.   -7/5 discussed case with Dr. Zachery [facility] GI documents patient pathology NOT fully consistent with microscopic colitis   -7/5 patient's WBC increased overnight.Dr. Zachery Conch GI may order CT enterography if patient is negative for UTI. -Colestid 2 g daily - Lomotil every 4 hours -7/5 TSH = 1.0  -Diarrhea has resolved   Leukocytosis - Multifactorial, presumed UTI, reactive from diarrhea and CTE - See UTI   Bacteremia positive staph hemolyticus  - 7/9, 1/4 bottles most likely contaminant, no action required most likely contaminant -7/9 repeat blood cultures NGTD   UTI positive E. coli - 7/8 empiric coverage with ciprofloxacin 500 mg BID, allergy to cephalosporin -7/10 E. coli resistant to Cipro: Changed to Ancef patient with multiple drug allergies so we will keep patient overnight to ensure she tolerates. -Stable for discharge.  Will discharge on Keflex 500 mg BID 5-day course total..   Non anion gap metabolic acidosis -Due to diarrhea.   -7/5 sodium bicarb 75 ml/hr -Resolved - 7/6 normal saline77m/hr -Resolved   Anxiety/depression/insomnia -Xanax 0.25 mg TID -Trazodone 100 mg qhs.   Hypokalemia -Potassium goal> 4 - 7/7 K-Dur 30 mEq BID   Hypophosphatemia -Phosphorus goal> 2.5   Hypomagnesmia - Magnesium goal> 2 - 7/7  Magnesium IV 2 g   History of psoriasis -Methotrexate on hold.  Outpatient follow-up with rheumatology.     Discharge Diagnoses:  Principal Problem:   Intractable nausea and vomiting Active Problems:   INSOMNIA, CHRONIC   Anxiety and depression   Gastroesophageal reflux disease without esophagitis   Moderate persistent asthma   OAB (overactive bladder)   Psoriatic arthritis (HCC)   S/P Nissen fundoplication (without gastrostomy tube) procedure   Diarrhea   Gastroenteritis   Hypokalemia   Metabolic acidosis   Acute gastroenteritis   Incontinence of feces   Bacteremia   Discharge Condition: Stable  Diet recommendation: Regular  Filed Weights   06/26/22 0500 06/27/22 0450 06/28/22 0500  Weight: 83.4 kg 83.9 kg 84.1 kg    History of present illness:  66year old WF PMHx GERD, anxiety, depression, insomnia, Nissen fundoplication, seasonal allergies, osteoarthritis, and psoriasis on methotrexate    Presented with nausea, vomiting, fever with worsening diarrhea.  On presentation, she was febrile to 100.5, tachycardic. CT abdomen and pelvis with nonspecific misty appearance of root of the abdominal mesentery which could be seen in the setting of mesenteric adenitis, colonic diverticulosis without diverticulitis and moderate to large liquid stool burden without evidence of enteric obstruction.  She was started on IV fluids.  GI was subsequently consulted and she underwent colonoscopy on 06/20/2022  Hospital Course:  See above  Procedures: 7/6 CTE:No CT findings to suggest inflammatory bowel disease or Crohn's disease. -Interval resolution of inflammatory changes in the root small bowel mesentery with some residual scattered borderline mesenteric lymph nodes. Findings suggest resolving mesenteric adenitis. -Colonic diverticulosis without findings for acute diverticulitis.   Consultations: GI   Cultures  6/30 GI panel negative  7/1 C. difficile antigen negative, C.  difficile toxin negative 7/7 blood positive staph hemolyticus 1/4 bottles most likely contaminant 7/7 urine positive E. coli resistant to Cipro 7/7 LEFT AC NGTD 7/9 RIGHT hand NGTD 7/9 LEFT hand NGTD   Antibiotics Anti-infectives (From admission, onward)    Start     Ordered Stop   06/28/22 1200  cephALEXin (KEFLEX) capsule 500 mg        06/28/22 1043     06/28/22 0000  cephALEXin (KEFLEX) 500 MG capsule        06/28/22 1054     06/27/22 1200  ceFAZolin (ANCEF) IVPB 1 g/50 mL premix  Status:  Discontinued        06/27/22 1118 06/28/22 1043   06/25/22 1200  ciprofloxacin (CIPRO) tablet 500 mg  Status:  Discontinued        06/25/22 1102 06/27/22 1118   06/18/22 1700  azithromycin (ZITHROMAX) tablet 500 mg  Status:  Discontinued        06/18/22 0857 06/19/22 0935   06/17/22 1715  ciprofloxacin (CIPRO) tablet 500 mg        06/17/22 1714 06/17/22 1721         Discharge Exam: Vitals:   06/27/22 1132 06/27/22 2004 06/28/22 0500 06/28/22 0527  BP: 128/74 116/66  (!) 114/50  Pulse: 75 85  (!) 57  Resp: '17 18  18  '$ Temp:  97.8 F (36.6 C)  98 F (36.7 C)  TempSrc:  Oral  Oral  SpO2: 100% 98%  97%  Weight:   84.1 kg   Height:        General: A/O x4, No acute respiratory distress Eyes: negative scleral hemorrhage, negative anisocoria, negative icterus ENT: Negative Runny nose, negative gingival bleeding, Neck:  Negative scars, masses, torticollis, lymphadenopathy, JVD Lungs: Clear to auscultation bilaterally without wheezes or crackles   Discharge Instructions   Allergies as of 06/28/2022       Reactions   Macrodantin [nitrofurantoin Macrocrystal] Itching, Rash   Cashew Nut (anacardium Occidentale) Skin Test Rash   Cefuroxime Axetil Rash, Other (See Comments)   REACTION: red face/rash   Flavoring Agent Itching, Rash   Peanut-containing Drug Products Rash   Penicillins Rash   Tolerated Ancef intra op 05/04/2021   Shellfish Allergy Itching, Rash   Redness         Medication List     STOP taking these medications    meloxicam 7.5 MG tablet Commonly known as: MOBIC   methocarbamol 500 MG tablet Commonly known as: ROBAXIN   spironolactone 25 MG tablet Commonly known as: ALDACTONE       TAKE these medications    albuterol 108 (90 Base) MCG/ACT inhaler Commonly known as: VENTOLIN HFA Inhale 2 puffs into the lungs every 4 (four) hours as needed for wheezing or shortness of breath.   ALPRAZolam 0.25 MG tablet Commonly known as: XANAX TAKE 1 TABLET AT BEDTIME AS NEEDED FOR ANXIETY What changed: See the new instructions.   celecoxib 200 MG capsule Commonly known as: CELEBREX Take 200 mg by mouth as needed for mild pain.   cephALEXin 500 MG capsule Commonly known as: KEFLEX Take 1 capsule (500 mg total) by mouth every 12 (twelve) hours.   colestipol 1 g tablet Commonly known as: COLESTID Take 2 tablets (2 g total) by mouth 2 (two) times daily.   diphenoxylate-atropine 2.5-0.025 MG tablet Commonly known as: LOMOTIL Take 1 tablet by mouth every 4 (four) hours.   EPINEPHrine 0.3 mg/0.3 mL Soaj  injection Commonly known as: EpiPen 2-Pak Use as directed for severe allergic reactions What changed:  how much to take how to take this when to take this reasons to take this additional instructions   Estradiol 10 MCG Tabs vaginal tablet Commonly known as: Vagifem USE VAGINALLY 2 TIMES PER WK What changed:  how much to take how to take this   fluticasone 50 MCG/ACT nasal spray Commonly known as: Flonase TWO SPRAYS EACH NOSTRIL ONCE A DAY FOR NASAL CONGESTION OR DRAINAGE. What changed:  how to take this when to take this reasons to take this additional instructions   folic acid 1 MG tablet Commonly known as: FOLVITE Take 1 mg by mouth daily.   levocetirizine 5 MG tablet Commonly known as: XYZAL TAKE 1 TABLET BY MOUTH EVERY DAY IN THE EVENING What changed:  how much to take how to take this when to take  this additional instructions   methotrexate 2.5 MG tablet Commonly known as: RHEUMATREX Take 15 mg by mouth once a week.   montelukast 10 MG tablet Commonly known as: SINGULAIR TAKE 1 TABLET BY MOUTH EVERYDAY AT BEDTIME What changed: See the new instructions.   multivitamin with minerals Tabs tablet Take 1 tablet by mouth daily.   nystatin powder Commonly known as: MYCOSTATIN/NYSTOP Apply 1 application topically 3 (three) times daily.   Simponi Aria 50 MG/4ML Soln injection Generic drug: golimumab Inject 50 mg into the vein every 8 (eight) weeks.   traZODone 100 MG tablet Commonly known as: DESYREL Take 1-1.5 tablets (100-150 mg total) by mouth at bedtime.   VITAMIN B COMPLEX PO Take 1 tablet by mouth daily.   VITAMIN C PO Take 1 tablet by mouth daily.   Vitamin D 50 MCG (2000 UT) Caps Take 4,000 Units by mouth daily.   zinc gluconate 50 MG tablet Take 50 mg by mouth daily.       Allergies  Allergen Reactions   Macrodantin [Nitrofurantoin Macrocrystal] Itching and Rash   Cashew Nut (Anacardium Occidentale) Skin Test Rash   Cefuroxime Axetil Rash and Other (See Comments)    REACTION: red face/rash   Flavoring Agent Itching and Rash   Peanut-Containing Drug Products Rash   Penicillins Rash    Tolerated Ancef intra op 05/04/2021   Shellfish Allergy Itching and Rash    Redness    Follow-up Information     Doran Stabler, MD Follow up on 07/28/2022.   Specialty: Gastroenterology Why: 8:40AM Contact information: [place] [address] [phone]                  The results of significant diagnostics from this hospitalization (including imaging, microbiology, ancillary and laboratory) are listed below for reference.    Significant Diagnostic Studies: CT ENTERO ABD/PELVIS W CONTAST  Result Date: 06/23/2022 CLINICAL DATA:  Persistent diarrhea. EXAM: CT ABDOMEN AND PELVIS WITH CONTRAST (ENTEROGRAPHY) TECHNIQUE:  Multidetector CT of the abdomen and pelvis during bolus administration of intravenous contrast. Negative oral contrast was given. RADIATION DOSE REDUCTION: This exam was performed according to the departmental dose-optimization program which includes automated exposure control, adjustment of the mA and/or kV according to patient size and/or use of iterative reconstruction technique. CONTRAST:  144m OMNIPAQUE IOHEXOL 300 MG/ML  SOLN COMPARISON:  CT scan 06/17/2022 FINDINGS: Lower chest:  Insert lung bases Hepatobiliary: No hepatic lesions or intrahepatic biliary dilatation. The gallbladder contains layering high attenuation material which is likely vicarious excretion of contrast from the prior CT  scan. There was no evidence of sludge or stones on the prior CT. No common bile duct dilatation. Pancreas: No mass, inflammation or ductal dilatation. Spleen: Normal size.  No focal lesions. Adrenals/Urinary Tract: Adrenal glands and kidneys are normal. No renal lesions, renal calculi or hydronephrosis. The bladder is unremarkable. Stomach/Bowel: The stomach, duodenum and small bowel are well distended. No areas of abnormal mucosal enhancement to suggest inflammatory bowel disease or Crohn's disease. The terminal ileum is unremarkable. The appendix is normal. There is moderate fluid in the cecum. No inflammatory changes. Scattered colonic diverticulosis, most notable in the sigmoid colon but no findings for acute diverticulitis. Vascular/Lymphatic: Scattered distal aortic calcifications but no aneurysm or dissection. The branch vessels are patent. The major venous structures are patent. The inflammatory changes in the root of the small bowel mesentery seen on the prior CT scan have resolved. There are some residual scattered borderline mesenteric lymph nodes. Findings suggest resolving mesenteric adenitis. No retroperitoneal mass or adenopathy. Reproductive: The uterus and ovaries are unremarkable. Bilateral tubal ligation  clips are noted. Other: No free pelvic fluid collections or pelvic adenopathy the. No abdominal wall hernia. Musculoskeletal: No significant bony findings. Advanced degenerative changes involving the spine. IMPRESSION: 1. No CT findings to suggest inflammatory bowel disease or Crohn's disease. 2. Interval resolution of inflammatory changes in the root of the small bowel mesentery with some residual scattered borderline mesenteric lymph nodes. Findings suggest resolving mesenteric adenitis. 3. Colonic diverticulosis without findings for acute diverticulitis. Aortic Atherosclerosis (ICD10-I70.0). Electronically Signed   By: Marijo Sanes M.D.   On: 06/23/2022 16:07   CT ABDOMEN PELVIS W CONTRAST  Result Date: 06/17/2022 CLINICAL DATA:  Acute non localized abdominal pain. EXAM: CT ABDOMEN AND PELVIS WITH CONTRAST TECHNIQUE: Multidetector CT imaging of the abdomen and pelvis was performed using the standard protocol following bolus administration of intravenous contrast. RADIATION DOSE REDUCTION: This exam was performed according to the departmental dose-optimization program which includes automated exposure control, adjustment of the mA and/or kV according to patient size and/or use of iterative reconstruction technique. CONTRAST:  18m OMNIPAQUE IOHEXOL 300 MG/ML  SOLN COMPARISON:  None Available. FINDINGS: Lower chest: Limited visualization of the lower thorax is negative for focal airspace opacity or pleural effusion. Normal heart size.  No pericardial effusion. Hepatobiliary: Normal hepatic contour. No discrete hepatic lesions. Normal appearance of the gallbladder given degree of distention. No radiopaque gallstones. No intra or extrahepatic biliary duct dilatation. No ascites. Pancreas: Normal appearance of the pancreas. Spleen: Normal appearance of the spleen. Adrenals/Urinary Tract: There is symmetric enhancement and excretion of the bilateral kidneys. No evidence of nephrolithiasis on this postcontrast  examination. No discrete renal lesions. No urine obstruction or perinephric stranding Normal appearance of the bilateral adrenal glands. Normal appearance of the urinary bladder given degree of distention. Stomach/Bowel: Rather extensive colonic diverticulosis, primarily involving the distal descending and sigmoid colon, without definitive evidence of superimposed acute diverticulitis. Moderate to large liquid stool burden, not resulting in enteric obstruction. Normal appearance of the terminal ileum and the retrocecal appendix. Small hiatal hernia. No pneumoperitoneum, pneumatosis or portal venous gas. Vascular/Lymphatic: Misty appearance of the root of the abdominal mesentery within the left mid abdomen with scattered prominent though non pathologically enlarged mesenteric lymph nodes with index lymph nodes measuring 0.5 - 0.7 cm in diameter (representative images 30, 38, 40 and 44, series 2). No bulky retroperitoneal, mesenteric, pelvic or inguinal lymph adenopathy. Reproductive: Post bilateral tubal ligation. No discrete adnexal lesions. No free fluid within the pelvic  cul-de-sac. Other: Regional soft tissues appear unremarkable. Musculoskeletal: No acute or aggressive osseous abnormalities moderate multilevel lumbar spine DDD, worse at L1-L2 and L2-L3 with disc space height loss, endplate irregularity and small posteriorly directed disc osteophyte complexes at these locations. IMPRESSION: 1. Misty appearance of the root of the abdominal mesentery, nonspecific though potentially could be seen in the setting of a mesenteric adenitis, less likely secondary to a lymphoproliferative disorder. Clinical correlation is advised. 2. Rather extensive colonic diverticulosis, primarily involving the distal descending and sigmoid colon, without evidence of superimposed acute diverticulitis. If not recently performed, further evaluation with colonoscopy could be performed as indicated. 3. Moderate to large liquid stool burden  without evidence of enteric obstruction. Electronically Signed   By: Sandi Mariscal M.D.   On: 06/17/2022 16:43    Microbiology: Recent Results (from the past 240 hour(s))  Urine Culture     Status: Abnormal   Collection Time: 06/24/22  6:41 PM   Specimen: Urine, Clean Catch  Result Value Ref Range Status   Specimen Description   Final    URINE, CLEAN CATCH Performed at [facility], [place] [address], [place], [place] [address]    Special Requests   Final    NONE Performed at [facility], [place] [address], [place], [place] [address]    Culture 80,000 COLONIES/mL ESCHERICHIA COLI (A)  Final   Report Status 06/27/2022 FINAL  Final   Organism ID, Bacteria ESCHERICHIA COLI (A)  Final      Susceptibility   Escherichia coli - MIC*    AMPICILLIN 8 SENSITIVE Sensitive     CEFAZOLIN <=4 SENSITIVE Sensitive     CEFEPIME <=0.12 SENSITIVE Sensitive     CEFTRIAXONE <=0.25 SENSITIVE Sensitive     CIPROFLOXACIN >=4 RESISTANT Resistant     GENTAMICIN <=1 SENSITIVE Sensitive     IMIPENEM <=0.25 SENSITIVE Sensitive     NITROFURANTOIN <=16 SENSITIVE Sensitive     TRIMETH/SULFA >=320 RESISTANT Resistant     AMPICILLIN/SULBACTAM 4 SENSITIVE Sensitive     PIP/TAZO <=4 SENSITIVE Sensitive     * 80,000 COLONIES/mL ESCHERICHIA COLI  Culture, blood (Routine X 2) w Reflex to ID Panel     Status: Abnormal   Collection Time: 06/24/22  6:52 PM   Specimen: BLOOD  Result Value Ref Range Status   Specimen Description   Final    BLOOD BLOOD RIGHT HAND Performed at [facility] [address], [place], [place] [address]    Special Requests   Final    BOTTLES DRAWN AEROBIC AND ANAEROBIC Blood Culture adequate volume Performed at [place] [address] [place], [place] [address]    Culture  Setup Time   Final    GRAM POSITIVE COCCI IN CLUSTERS AEROBIC BOTTLE ONLY CRITICAL RESULT CALLED TO, READ BACK BY AND VERIFIED WITH: L  POINDEXTER,PHARMD'@2200'$  06/25/22 [place]    Culture (A)  Final    STAPHYLOCOCCUS HAEMOLYTICUS THE SIGNIFICANCE OF ISOLATING THIS ORGANISM FROM A SINGLE SET OF BLOOD CULTURES WHEN MULTIPLE SETS ARE DRAWN IS UNCERTAIN. PLEASE NOTIFY THE MICROBIOLOGY DEPARTMENT WITHIN ONE WEEK IF SPECIATION AND SENSITIVITIES ARE REQUIRED. Performed at [facility], [place] [address], [place], [place] [address]    Report Status 06/27/2022 FINAL  Final  Blood Culture ID Panel (Reflexed)     Status: Abnormal   Collection Time: 06/24/22  6:52 PM  Result Value Ref Range Status   Enterococcus faecalis NOT DETECTED NOT DETECTED Final   Enterococcus  Faecium NOT DETECTED NOT DETECTED Final   Listeria monocytogenes NOT DETECTED NOT DETECTED Final   Staphylococcus species DETECTED (A) NOT DETECTED Final    Comment: CRITICAL RESULT CALLED TO, READ BACK BY AND VERIFIED WITH: L POINDEXTER,PHARMD'@2200'$  06/25/22 [place]    Staphylococcus aureus (BCID) NOT DETECTED NOT DETECTED Final   Staphylococcus epidermidis NOT DETECTED NOT DETECTED Final   Staphylococcus lugdunensis NOT DETECTED NOT DETECTED Final   Streptococcus species NOT DETECTED NOT DETECTED Final   Streptococcus agalactiae NOT DETECTED NOT DETECTED Final   Streptococcus pneumoniae NOT DETECTED NOT DETECTED Final   Streptococcus pyogenes NOT DETECTED NOT DETECTED Final   A.calcoaceticus-baumannii NOT DETECTED NOT DETECTED Final   Bacteroides fragilis NOT DETECTED NOT DETECTED Final   Enterobacterales NOT DETECTED NOT DETECTED Final   Enterobacter cloacae complex NOT DETECTED NOT DETECTED Final   Escherichia coli NOT DETECTED NOT DETECTED Final   Klebsiella aerogenes NOT DETECTED NOT DETECTED Final   Klebsiella oxytoca NOT DETECTED NOT DETECTED Final   Klebsiella pneumoniae NOT DETECTED NOT DETECTED Final   Proteus species NOT DETECTED NOT DETECTED Final   Salmonella species NOT DETECTED NOT DETECTED Final   Serratia marcescens NOT DETECTED NOT DETECTED Final    Haemophilus influenzae NOT DETECTED NOT DETECTED Final   Neisseria meningitidis NOT DETECTED NOT DETECTED Final   Pseudomonas aeruginosa NOT DETECTED NOT DETECTED Final   Stenotrophomonas maltophilia NOT DETECTED NOT DETECTED Final   Candida albicans NOT DETECTED NOT DETECTED Final   Candida auris NOT DETECTED NOT DETECTED Final   Candida glabrata NOT DETECTED NOT DETECTED Final   Candida krusei NOT DETECTED NOT DETECTED Final   Candida parapsilosis NOT DETECTED NOT DETECTED Final   Candida tropicalis NOT DETECTED NOT DETECTED Final   Cryptococcus neoformans/gattii NOT DETECTED NOT DETECTED Final    Comment: Performed at [facility], [address], [place], [place] [address]  Culture, blood (Routine X 2) w Reflex to ID Panel     Status: None (Preliminary result)   Collection Time: 06/24/22  6:54 PM   Specimen: BLOOD  Result Value Ref Range Status   Specimen Description   Final    BLOOD LEFT ANTECUBITAL Performed at [place] [address] [place], [place] [address]    Special Requests   Final    BOTTLES DRAWN AEROBIC AND ANAEROBIC Blood Culture adequate volume Performed at [place] [address] [place], [place] [address]    Culture   Final    NO GROWTH 4 DAYS Performed at [facility], [place] [address] [place], [place] [address]    Report Status PENDING  Incomplete  Culture, blood (Routine X 2) w Reflex to ID Panel     Status: None (Preliminary result)   Collection Time: 06/26/22 10:59 AM   Specimen: BLOOD  Result Value Ref Range Status   Specimen Description   Final    BLOOD BLOOD RIGHT HAND Performed at [place] [address] [place], [place] [address]    Special Requests   Final    BOTTLES DRAWN AEROBIC AND ANAEROBIC Blood Culture adequate volume Performed at [facility] [facility] [address] [place], [place] [address]    Culture   Final    NO GROWTH 2 DAYS Performed  at [facility] [address] [place], [place] [address]    Report Status PENDING  Incomplete  Culture, blood (Routine X 2) w Reflex to ID Panel  Status: None (Preliminary result)   Collection Time: 06/26/22 10:59 AM   Specimen: BLOOD  Result Value Ref Range Status   Specimen Description   Final    BLOOD BLOOD LEFT HAND Performed at [facility] [address], [place], [place] [address]    Special Requests   Final    BOTTLES DRAWN AEROBIC AND ANAEROBIC Blood Culture adequate volume Performed at [facility] [address], [place], [place] [address]    Culture   Final    NO GROWTH 2 DAYS Performed at [employer] [address], [place], [place] [address]    Report Status PENDING  Incomplete     Labs: Basic Metabolic Panel: Recent Labs  Lab 06/23/22 0613 06/24/22 0628 06/25/22 0455 06/26/22 0546 06/27/22 0523 06/28/22 0532  NA 138 140 137 140 140  --   K 3.6 2.9* 3.6 4.0 4.5  --   CL 103 105 109 108 108  --   CO2 '28 28 24 27 27  '$ --   GLUCOSE 106* 102* 106* 117* 100*  --   BUN <5* '8 8 8 10  '$ --   CREATININE 0.64 0.69 0.59 0.64 0.81  --   CALCIUM 8.3* 8.0* 7.7* 8.1* 8.4*  --   MG 1.7 1.6* 2.0 2.0 2.1 2.0  PHOS 3.5 4.6 3.7 3.1 3.8 4.1   Liver Function Tests: Recent Labs  Lab 06/23/22 0613 06/24/22 0628 06/25/22 0455 06/26/22 0546 06/27/22 0523  AST 9* 12* 12* 13* 15  ALT '9 12 12 13 12  '$ ALKPHOS 57 57 53 59 54  BILITOT 0.5 0.6 0.3 0.4 0.5  PROT 5.4* 5.0* 4.7* 5.1* 5.1*  ALBUMIN 2.6* 2.2* 2.1* 2.2* 2.3*   No results for input(s): "LIPASE", "AMYLASE" in the last 168 hours. No results for input(s): "AMMONIA" in the last 168 hours. CBC: Recent Labs  Lab 06/24/22 0628 06/25/22 0455 06/26/22 0546 06/27/22 0523 06/28/22 0532  WBC 15.5* 13.9* 13.7* 14.3* 13.7*  NEUTROABS 7.9* 7.3 6.3 6.3 5.9  HGB 10.7* 10.4* 10.2* 10.4* 10.6*  HCT 32.8* 31.7* 31.4* 32.5* 32.7*  MCV 91.1 91.9 92.6 96.7 94.0  PLT 185  167 176 181 189   Cardiac Enzymes: No results for input(s): "CKTOTAL", "CKMB", "CKMBINDEX", "TROPONINI" in the last 168 hours. BNP: BNP (last 3 results) No results for input(s): "BNP" in the last 8760 hours.  ProBNP (last 3 results) No results for input(s): "PROBNP" in the last 8760 hours.  CBG: No results for input(s): "GLUCAP" in the last 168 hours.     Signed:  Dia Crawford, MD [facility]

## 2022-06-28 NOTE — Telephone Encounter (Signed)
Reviewed, medication directions with Colestid and Lomotil.

## 2022-06-28 NOTE — Plan of Care (Signed)

## 2022-06-28 NOTE — Telephone Encounter (Signed)
Inbound call from patient requesting to speak with a nurse regarding medication intake. Please give patient a call back to advise.  Thank you

## 2022-06-29 ENCOUNTER — Other Ambulatory Visit: Payer: Self-pay | Admitting: *Deleted

## 2022-06-29 LAB — CULTURE, BLOOD (ROUTINE X 2)
Culture: NO GROWTH
Special Requests: ADEQUATE

## 2022-06-29 NOTE — Patient Outreach (Signed)
  Care Coordination [facility] Note Transition Care Management Follow-up Telephone Call Date of discharge and from where: 06/28/22 [facility] How have you been since you were released from the hospital? I am ok but I am feeling weak. Any questions or concerns? No  Items Reviewed: Did the pt receive and understand the discharge instructions provided? Yes  Medications obtained and verified? No  Other? No  Any new allergies since your discharge? No  Dietary orders reviewed? Yes Do you have support at home? Yes   Home Care and Equipment/Supplies: Were home health services ordered? no If so, what is the name of the agency? N/A  Has the agency set up a time to come to the patient's home? not applicable Were any new equipment or medical supplies ordered?  No What is the name of the medical supply agency? N/A Were you able to get the supplies/equipment? not applicable Do you have any questions related to the use of the equipment or supplies? No  Functional Questionnaire: (I = Independent and D = Dependent) ADLs: I  Bathing/Dressing- I  Meal Prep- I  Eating- I  Maintaining continence- I  Transferring/Ambulation- I  Managing Meds- I  Follow up appointments reviewed:  PCP Hospital f/u appt confirmed? No   Specialist Hospital f/u appt confirmed? Yes  Scheduled to see Dr. Loletha Carrow on 07/28/22 @ 0840. Are transportation arrangements needed? Yes  If their condition worsens, is the pt aware to call PCP or go to the Emergency Dept.? Yes Was the patient provided with contact information for the PCP's office or ED? Yes Was to pt encouraged to call back with questions or concerns? Yes  SDOH assessments and interventions completed:   Yes  Care Coordination Interventions Activated:  No Care Coordination Interventions:   N/A  Encounter Outcome:  Pt. Visit Completed

## 2022-06-29 NOTE — Patient Outreach (Signed)
  Care Coordination [facility] Note Transition Care Management Unsuccessful Follow-up Telephone Call  Date of discharge and from where:  06/28/22 [facility]  Attempts:  1st Attempt  Reason for unsuccessful TCM follow-up call:  Left voice message  Emelia Loron RN, BSN [place] [phone] [email or web address]

## 2022-07-01 LAB — CULTURE, BLOOD (ROUTINE X 2)
Culture: NO GROWTH
Culture: NO GROWTH
Special Requests: ADEQUATE
Special Requests: ADEQUATE

## 2022-07-01 NOTE — Telephone Encounter (Signed)
Patient was needing reassurance of medication management with Lomotil and Colestid. Advised her to titrate as needed.

## 2022-07-04 ENCOUNTER — Telehealth: Payer: Self-pay | Admitting: Gastroenterology

## 2022-07-04 NOTE — Telephone Encounter (Signed)
I am aware of her recent inpatient stay after speaking with Dr. 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  This treatment regimen will not likely eliminate the diarrhea completely, we are just looking to control it with stool that is at least semi-formed and fewer than 3 BMs per day.  I recommend Lomotil 1 tablet every 6 hours and colestipol 2 g once daily.  If that does not sufficiently control the diarrhea, then take 2 tablets of Lomotil (instead of 1 tablet) with the first morning and last evening dose.  If at any point she has no BM for an entire day, then eliminate the colestipol.  The appointment she was given is my next available, and she can see an APP sooner if willing.  -HD

## 2022-07-04 NOTE — Telephone Encounter (Signed)
Pt called in. She states that she is very concerned that something is going on "inside of her". Pt stated that she had not been keeping up with the Lomotil and Colestid like she should have and she paid for it. Pt reports that the diarrhea returned last night. I told pt that she should be taking Lomotil every 4 hours and Colestid 2 g daily. Pt is concerned about constipation, she stated that she spoke with the CMA twice last week and was still unclear on how to titrate the medications. Pt states that the diarrhea "hit her hard" last night. Pt reports that her symptoms are usually controlled on the Lomotil every 4 hours but patient is concerned about being constipated and being on the medication long term. Pt denies any abdominal pain, but does report rumbling in her stomach. Pt is not sure if foods are causing her symptoms or not. Pt has not kept a food diary, but denies any correlation between her symptoms and certain foods. Pt states that she also started a pre and probiotic. Pt states that she can't step foot in another [facility] hospital and she is so down on [facility]. Pt wanting to be seen sooner than 07/28/22, I offered her an appt with an app next week. Pt is seeking further advise or alternative treatment options in the interim.  Please advise, thanks.

## 2022-07-04 NOTE — Telephone Encounter (Signed)
Inbound call from patient requesting a call back to discuss her issues with diarrhea. Patient is scheduled to see Dr. Loletha Carrow on 8/10 at 8:40. Please advise.

## 2022-07-04 NOTE — Telephone Encounter (Signed)
Called and spoke with patient regarding Dr. Loletha Carrow' recommendations. Melissa Middleton would like all of the recommendations sent to her via MyChart for her review. I again offered Melissa Middleton a sooner appt with an APP and she said that she did not know if that would do her any good. Melissa Middleton advised to keep her f/u with Dr. Loletha Carrow. Melissa Middleton verbalized understanding and had no concerns at the end of the call.

## 2022-07-06 ENCOUNTER — Telehealth: Payer: 59 | Admitting: Family Medicine

## 2022-07-20 ENCOUNTER — Other Ambulatory Visit: Payer: Self-pay | Admitting: Nurse Practitioner

## 2022-07-20 NOTE — Telephone Encounter (Signed)
Medication refill request: xanax 0.'25mg'$  Last AEX:  11-30-21 Next AEX: 12-01-22 Last MMG (if hormonal medication request): n/a Refill authorized: last given 05-23-22 #30 with 0 refills. Please approve if appropriate

## 2022-07-26 ENCOUNTER — Ambulatory Visit: Payer: 59 | Admitting: Internal Medicine

## 2022-07-26 ENCOUNTER — Ambulatory Visit (HOSPITAL_BASED_OUTPATIENT_CLINIC_OR_DEPARTMENT_OTHER)
Admission: RE | Admit: 2022-07-26 | Discharge: 2022-07-26 | Disposition: A | Discharge status: Home / Self Care | Payer: 59 | Source: Ambulatory Visit | Attending: Internal Medicine | Admitting: Internal Medicine

## 2022-07-26 ENCOUNTER — Encounter: Payer: Self-pay | Admitting: Internal Medicine

## 2022-07-26 VITALS — BP 118/62 | HR 80 | Temp 98.1°F | Resp 18 | Ht 65.0 in | Wt 173.3 lb

## 2022-07-26 DIAGNOSIS — R197 Diarrhea, unspecified: Secondary | ICD-10-CM

## 2022-07-26 DIAGNOSIS — J4530 Mild persistent asthma, uncomplicated: Secondary | ICD-10-CM | POA: Insufficient documentation

## 2022-07-26 DIAGNOSIS — R590 Localized enlarged lymph nodes: Secondary | ICD-10-CM

## 2022-07-26 DIAGNOSIS — T782XXD Anaphylactic shock, unspecified, subsequent encounter: Secondary | ICD-10-CM

## 2022-07-26 DIAGNOSIS — J3089 Other allergic rhinitis: Secondary | ICD-10-CM | POA: Diagnosis not present

## 2022-07-26 DIAGNOSIS — K909 Intestinal malabsorption, unspecified: Secondary | ICD-10-CM

## 2022-07-26 DIAGNOSIS — Z9225 Personal history of immunosupression therapy: Secondary | ICD-10-CM

## 2022-07-26 MED ORDER — BECLOMETHASONE DIPROP HFA 40 MCG/ACT IN AERB: 2.0000 | INHALATION_SPRAY | Freq: Two times a day (BID) | RESPIRATORY_TRACT | 6 refills | Status: AC

## 2022-07-26 NOTE — Progress Notes (Signed)
Follow Up Note  RE: ARDELL AARONSON MRN: [id number] DOB: 09/19/1956 Date of Office Visit: 07/26/2022  Referring provider: Shelda Pal* Primary care provider: Shelda Pal, DO  Chief Complaint: Nasal Congestion, Shortness of Breath, and Other (Diarrhea since June 24 nonstop, slight congestion, labor breathing, chest tightness and shallow coughing/)  History of Present Illness: I had the pleasure of seeing Maclaine Ahola for a follow up visit at the [facility] on 07/26/2022. She is a 66 y.o. female, with past medical history of psoriatic arthritis previously on methotrexate/Simponi who is being followed for asthma, allergic rhinitis previously on AIT, food allergies. Her previous allergy office visit was on 01/10/20 with Dr. Maudie Mercury. Today is a new complaint visit of diarrhea  .  History obtained from patient, chart review and  husband .  She previously had been following in this clinic for allergic rhinitis after completing 8 years of allergy immunotherapy she decided to stop allergy injections.  She reports that she has not gotten any sinus infections since stopping allergy injections.  She also had a history of intermittent asthma and only required albuterol as needed usually once or twice a year during URIs.  However she has noticed increased chest tightness over the past few weeks as well as dyspnea on exertion.  Regards to food allergy she has avoided peanuts and shellfish and cashew but has been eating other tree nuts such as walnuts.  Her main complaint is her history of diarrhea starting in May 2023.  She reports watery diarrhea initially 4 times a day with stool incontinence.  His rapidly progressed at the end of June resulting in 12-day hospitalization at [facility].  GI workup is summarized below.  She also presented to [facility] ER on 07/05/2022 for worsening symptoms and is continue to follow with GI as an outpatient without clear etiology of diarrhea.   Due to CT findings of mesenteric lymphadenopathy she was evaluated by heme-onc and blood work for lymphoma was done however suspicion for lymphoma is very low and current working diagnosis is reactive lymphadenopathy.  She is returning to allergy clinic today due to concerns that food allergies may have triggered her diarrhea.  She denies any clear food triggers for her symptoms.  Denies any urticaria, cough, wheeze, conjunctival injection associated with diarrhea.  She would have up to 20 stools a day when her symptoms were at their worst.  She is very frustrated she has not had a diagnosis yet.  She currently is on budesonide 3 mg 24-hour capsule, Lomotil, colestipol which has decreased frequency of diarrhea.  She is also very concerned about this progressive chest tightness and dyspnea over the past few weeks.  Previous FEV1 was 1.87 L, 73%.  She is unsure if this is asthma and is concerned for pneumonia and requests chest x-ray for further evaluation.   Summary of GI Work up to-date (as annotated below): from GI visit on 07/07/22  CT a/p 07/05/2022 (persistent diarrhea, personally reviewed): small hiatal hernia, previous Nissen with a post surgical fluid collection next to the cardia/fundus, diverticulosis, prominent appendix with no inflammatory stranding, mesenteric lymphadenopathy, hazy mesentery, normal pancreas Labs 07/05/2022: c diff negative, GI panel negative, CMP Na 135, albumin 3.9, WBC 13, hgb 13.9, plt 555 CT enterography 06/23/2022 (persistent diarrhea): Normal liver, bile ducts, gallbladder with high attenuation material suspected excretion of prior contrast, normal pancreas, no evidence of inflammatory changes small bowel/colon with no evidence of Crohn's disease. Diverticulosis, resolved inflammatory changes  of the small bowel mesentery. Residual scattered mesenteric lymph nodes. Colonoscopy 06/20/2022 (clinically significant diarrhea of nclear etilog- acute infectious work up negative, on  MTX/symponi, colonoscopy performed unprepped): normal terminal ileum, diverticulosis, Prep better than expected however residual stool in the colon- cecum could not be cleared. Pathology-random colon biopsy: Prominent lamina propria lymphoplasmacytosis and reparative changes; nonspecific increased intraepithelial lymphocytes. No evidence of chronicity. No viral cytopathic effect. Basement membrane thickening is not identified. Possibilities: self limiting colitis vs early microscopic colitis. Labs 07/01/2022: CBC hgb 10.6 WBC 13.7Celiac GI panel negative C. difficile negative GI panel negative, CMP- normal liver enzymes, albumin 2.3, cr 0.81; previously 01/2022 cbc- hgb 12.6 normal iron studies ) CT a/p 06/17/2022 (abdominal pain/diarrhea/n+v): Normal liver contour, normal gallbladder, pancreas, spleen, diverticulosis, stool entire colon, no terminal ileum, appendix, small hernia, misty appearance of the mesentery with scattered prominent subcentimeter mesenteric nodes Assessment and Plan: Serah is a 66 y.o. female with: Mild persistent asthma without complication - Plan: Spirometry with Graph, DG Chest 2 View  Other allergic rhinitis  Diarrhea due to malabsorption - Plan: IgE Nut Prof. w/Component Rflx, Allergy Panel 19, [employer], Food Allergy Profile  Anaphylaxis, subsequent encounter - Plan: IgE Nut Prof. w/Component Rflx, Allergy Panel 19, [employer], Food Allergy Profile  Mesenteric lymphadenopathy  Personal history of immunosuppression therapy Plan: Patient Instructions  Food Allergy  Past history - 2018 skin testing was positive to peanuts. Interim history -I do not think chronic diarrhea is caused by food allergies but we can update your testing. I am so sorry you been so sick!  Hopefully we are narrowing down on answers!  Continue to avoid all nuts and all shellfish until we get updated testing For diarrhea continue to follow-up with GI and take all medications as prescribed For  mild symptoms you can take over the counter antihistamines such as Benadryl and monitor symptoms closely. If symptoms worsen or if you have severe symptoms including breathing issues, throat closure, significant swelling, whole body hives, severe diarrhea and vomiting, lightheadedness then inject epinephrine and seek immediate medical care afterwards.   Mild persistent asthma  Increased dyspnea, chest tightness and decrease in FEV1 today on spirometry indicates asthma is not well-controlled Start Qvar 2 puffs twice daily with spacer Wash mouth out after use Get chest x-ray today May use albuterol rescue inhaler 2 puffs or nebulizer every 4 to 6 hours as needed for shortness of breath, chest tightness, coughing, and wheezing. May use albuterol rescue inhaler 2 puffs 5 to 15 minutes prior to strenuous physical activities. Monitor frequency of use.    Seasonal and perennial allergic rhinitis Past history - 2018 skin testing was positive to grass, tree, dust mites, mold and cockroach. Interim history - patient has completed 8 years of allergy immunotherapy finish current allergy vial and then we will stop the injections.  May use over the counter antihistamines such as Zyrtec (cetirizine), Claritin (loratadine), Allegra (fexofenadine), or Xyzal (levocetirizine) daily as needed. Continue montelukast '10mg'$  daily at night.  May use Flonase 1 spray per nostril twice a day as needed for nasal congestion.   Follow up: 4 weeks   Thank you so much for letting me partake in your care today.  Don't hesitate to reach out if you have any additional concerns!  Roney Marion, MD  [facility] [place] [place]   No follow-ups on file.  Meds ordered this encounter  Medications   beclomethasone (QVAR) 40 MCG/ACT inhaler    Sig: Inhale 2 puffs  into the lungs 2 (two) times daily.    Dispense:  1 each    Refill:  6    Lab Orders         IgE Nut Prof. w/Component Rflx         Allergy Panel  19, [employer]         Food Allergy Profile     Diagnostics: Spirometry:  Tracings reviewed. Her effort: Good reproducible efforts. FVC: 2.39 L FEV1: 1.56 L, 65% predicted FEV1/FVC ratio: 65% Interpretation: Spirometry consistent with moderate obstructive disease.  Please see scanned spirometry results for details.     Medication List:  Current Outpatient Medications  Medication Sig Dispense Refill   ALPRAZolam (XANAX) 0.25 MG tablet Take 1 tablet (0.25 mg total) by mouth at bedtime as needed for anxiety. TAKE 1 TABLET AT BEDTIME AS NEEDED FOR ANXIETY 30 tablet 0   Ascorbic Acid (VITAMIN C PO) Take 1 tablet by mouth daily.     B Complex Vitamins (VITAMIN B COMPLEX PO) Take 1 tablet by mouth daily.     beclomethasone (QVAR) 40 MCG/ACT inhaler Inhale 2 puffs into the lungs 2 (two) times daily. 1 each 6   budesonide (ENTOCORT EC) 3 MG 24 hr capsule Take by mouth.     celecoxib (CELEBREX) 200 MG capsule Take 200 mg by mouth as needed for mild pain.     Cholecalciferol (VITAMIN D) 50 MCG (2000 UT) CAPS Take 4,000 Units by mouth daily.     colestipol (COLESTID) 1 g tablet Take 2 tablets (2 g total) by mouth 2 (two) times daily. 56 tablet 0   dicyclomine (BENTYL) 20 MG tablet Take 20 mg by mouth every 6 (six) hours.     diphenoxylate-atropine (LOMOTIL) 2.5-0.025 MG tablet Take 1 tablet by mouth every 4 (four) hours. 30 tablet 0   EPINEPHrine (EPIPEN 2-PAK) 0.3 mg/0.3 mL IJ SOAJ injection Use as directed for severe allergic reactions (Patient taking differently: Inject 0.3 mg into the muscle as needed for anaphylaxis.) 2 Device 2   esomeprazole (NEXIUM) 40 MG capsule one capsule (40 mg dose).     Estradiol (VAGIFEM) 10 MCG TABS vaginal tablet USE VAGINALLY 2 TIMES PER WK (Patient taking differently: Place 10 mcg vaginally. USE VAGINALLY 2 TIMES PER WK) 24 tablet 1   Ferrous Sulfate (IRON) 325 (65 Fe) MG TABS 1 tablet Orally Once a day for 30 day(s)     fluticasone (FLONASE) 50 MCG/ACT  nasal spray TWO SPRAYS EACH NOSTRIL ONCE A DAY FOR NASAL CONGESTION OR DRAINAGE. (Patient taking differently: Place into both nostrils daily as needed for allergies.) 16 g 5   folic acid (FOLVITE) 1 MG tablet Take 1 mg by mouth daily.     levocetirizine (XYZAL) 5 MG tablet TAKE 1 TABLET BY MOUTH EVERY DAY IN THE EVENING (Patient taking differently: Take 5 mg by mouth every evening.) 90 tablet 1   methotrexate (RHEUMATREX) 2.5 MG tablet Take 15 mg by mouth once a week.     Multiple Vitamin (MULTIVITAMIN WITH MINERALS) TABS tablet Take 1 tablet by mouth daily.     nystatin (MYCOSTATIN/NYSTOP) powder Apply 1 application topically 3 (three) times daily. 15 g 1   SIMPONI ARIA 50 MG/4ML SOLN injection Inject 50 mg into the vein every 8 (eight) weeks.     spironolactone (ALDACTONE) 25 MG tablet Take by mouth.     traZODone (DESYREL) 100 MG tablet Take 1-1.5 tablets (100-150 mg total) by mouth at bedtime. 135 tablet 2  Zinc 100 MG TABS 1 tablet Orally Once a day for 30 day(s)     zinc gluconate 50 MG tablet Take 50 mg by mouth daily.     albuterol (VENTOLIN HFA) 108 (90 Base) MCG/ACT inhaler Inhale 2 puffs into the lungs every 4 (four) hours as needed for wheezing or shortness of breath. 18 g 1   cephALEXin (KEFLEX) 500 MG capsule Take 1 capsule (500 mg total) by mouth every 12 (twelve) hours. 10 capsule 0   montelukast (SINGULAIR) 10 MG tablet TAKE 1 TABLET BY MOUTH EVERYDAY AT BEDTIME (Patient not taking: Reported on 07/26/2022) 90 tablet 3   No current facility-administered medications for this visit.   Allergies: Allergies  Allergen Reactions   Macrodantin [Nitrofurantoin Macrocrystal] Itching and Rash   Cashew Nut (Anacardium Occidentale) Skin Test Rash   Cefuroxime Axetil Rash and Other (See Comments)    REACTION: red face/rash   Flavoring Agent Itching and Rash   Peanut-Containing Drug Products Rash   Penicillins Rash    Tolerated Ancef intra op 05/04/2021   Shellfish Allergy Itching and  Rash    Redness   I reviewed her past medical history, social history, family history, and environmental history and no significant changes have been reported from her previous visit.  ROS: All others negative except as noted per HPI.   Objective: BP 118/62   Pulse 80   Temp 98.1 F (36.7 C) (Temporal)   Resp 18   Ht '5\' 5"'$  (1.651 m)   Wt 173 lb 4.8 oz (78.6 kg)   SpO2 97%   BMI 28.84 kg/m  Body mass index is 28.84 kg/m. General Appearance:  Alert, cooperative, no distress, appears stated age  Head:  Normocephalic, without obvious abnormality, atraumatic  Eyes:  Conjunctiva clear, EOM's intact  Nose: Nares normal, normal mucosa, no visible anterior polyps, and septum midline  Throat: Lips, tongue normal; teeth and gums normal, normal posterior oropharynx and no tonsillar exudate  Neck: Supple, symmetrical  Lungs:   clear to auscultation bilaterally, Respirations unlabored, no coughing  Heart:  regular rate and rhythm and no murmur, Appears well perfused  Extremities: No edema  Skin: Skin color, texture, turgor normal, no rashes or lesions on visualized portions of skin   Neurologic: No gross deficits   Previous notes and tests were reviewed. The plan was reviewed with the patient/family, and all questions/concerned were addressed.  It was my pleasure to see Stefani today and participate in her care. Please feel free to contact me with any questions or concerns.  Sincerely,  Roney Marion, MD  Allergy & Immunology  [facility] [place] [facility] Office: [phone]

## 2022-07-26 NOTE — Patient Instructions (Addendum)
Food Allergy  Past history - 2018 skin testing was positive to peanuts. Interim history -I do not think chronic diarrhea is caused by food allergies but we can update your testing. I am so sorry you been so sick!  Hopefully we are narrowing down on answers!  Continue to avoid all nuts and all shellfish until we get updated testing For diarrhea continue to follow-up with GI and take all medications as prescribed For mild symptoms you can take over the counter antihistamines such as Benadryl and monitor symptoms closely. If symptoms worsen or if you have severe symptoms including breathing issues, throat closure, significant swelling, whole body hives, severe diarrhea and vomiting, lightheadedness then inject epinephrine and seek immediate medical care afterwards.   Mild persistent asthma  Increased dyspnea, chest tightness and decrease in FEV1 today on spirometry indicates asthma is not well-controlled Start Qvar 2 puffs twice daily with spacer Wash mouth out after use Get chest x-ray today May use albuterol rescue inhaler 2 puffs or nebulizer every 4 to 6 hours as needed for shortness of breath, chest tightness, coughing, and wheezing. May use albuterol rescue inhaler 2 puffs 5 to 15 minutes prior to strenuous physical activities. Monitor frequency of use.    Seasonal and perennial allergic rhinitis Past history - 2018 skin testing was positive to grass, tree, dust mites, mold and cockroach. Interim history - patient has completed 8 years of allergy immunotherapy finish current allergy vial and then we will stop the injections.  May use over the counter antihistamines such as Zyrtec (cetirizine), Claritin (loratadine), Allegra (fexofenadine), or Xyzal (levocetirizine) daily as needed. Continue montelukast '10mg'$  daily at night.  May use Flonase 1 spray per nostril twice a day as needed for nasal congestion.   Follow up: 4 weeks   Thank you so much for letting me partake in your care today.   Don't hesitate to reach out if you have any additional concerns!  Roney Marion, MD  [facility] [place], [place]

## 2022-07-27 ENCOUNTER — Telehealth: Payer: Self-pay | Admitting: Internal Medicine

## 2022-07-27 ENCOUNTER — Encounter: Payer: Self-pay | Admitting: Internal Medicine

## 2022-07-27 NOTE — Progress Notes (Signed)
Chest xray was normal.  Patient does not have pneumonia.  Recommend use of inhaler for asthma as that is the cause of her chest tightness and follow up in 4 weeks as planned.  Can someone let patient know?

## 2022-07-27 NOTE — Telephone Encounter (Signed)
Patient called Requesting a call from the nurse she has some concerns about her spiro test results please advise

## 2022-07-28 ENCOUNTER — Telehealth: Payer: Self-pay | Admitting: Gastroenterology

## 2022-07-28 ENCOUNTER — Encounter: Payer: Self-pay | Admitting: Gastroenterology

## 2022-07-28 ENCOUNTER — Ambulatory Visit: Payer: 59 | Admitting: Gastroenterology

## 2022-07-28 VITALS — BP 110/82 | HR 74 | Ht 65.0 in | Wt 175.0 lb

## 2022-07-28 DIAGNOSIS — K529 Noninfective gastroenteritis and colitis, unspecified: Secondary | ICD-10-CM

## 2022-07-28 DIAGNOSIS — R140 Abdominal distension (gaseous): Secondary | ICD-10-CM

## 2022-07-28 NOTE — Telephone Encounter (Signed)
Patient called regarding Colestipol medication, wondering if she needs to decrease medication at night as well. Patient requested a call back. Please call to advise.

## 2022-07-28 NOTE — Patient Instructions (Signed)
_______________________________________________________  If you are age 66 or older, your body mass index should be between 23-30. Your Body mass index is 29.12 kg/m. If this is out of the aforementioned range listed, please consider follow up with your Primary Care Provider.  If you are age 34 or younger, your body mass index should be between 19-25. Your Body mass index is 29.12 kg/m. If this is out of the aformentioned range listed, please consider follow up with your Primary Care Provider.   ________________________________________________________  The [facility] providers would like to encourage you to use [facility] to communicate with providers for non-urgent requests or questions.  Due to long hold times on the telephone, sending your provider a message by [facility] may be a faster and more efficient way to get a response.  Please allow 48 business hours for a response.  Please remember that this is for non-urgent requests.  _______________________________________________________  Stop the Budesonide.  Decrease the Colestipol to once a day in the morning.   Call in 7-10 days with an update.   It was a pleasure to see you today!  Thank you for trusting me with your gastrointestinal care!

## 2022-07-28 NOTE — Telephone Encounter (Signed)
She sent a mychart message and I have referred that message to dr Edison Pace

## 2022-07-28 NOTE — Progress Notes (Signed)
[facility] Progress Note  Chief Complaint: Chronic diarrhea  Subjective  History: Melissa Middleton is here for follow-up after hospitalization in late June for acute onset diarrheal illness with vomiting and diarrhea suspected to be infectious.  She was seen by Dr. Havery Moros and stool testing negative for identifiable organisms (GI pathogen panel, additional ova and parasite testing and additional C. difficile toxin, GDH antigen and PCR).Negative celiac panel.  Colonoscopy was done with biopsies showing nonspecific mild patchy increased lymphocytes.  She was treated with antidiarrheals and colestipol.  It should be noted she was dissatisfied with the care she received during that hospital stay. Dr. Havery Moros updated me currently in the hospital stay.  Merdith called our office 07/04/2022 with recurrence of diarrhea after scaling back her medicines, further treatment advice was given.  Unbeknownst to me until now, [name] went to an ED in [place] the following day for this diarrhea and then sought another opinion with the [facility] practice on 07/07/2022.  A consult note was reviewed, they wondered if this may have been an early microscopic colitis and put her on budesonide in addition to Lomotil and colestipol.  Because she had mild nonspecific mesenteric adenopathy on her CT scan during the [facility] hospital stay, she was then seen by hematologist through [facility] on 07/18/2022 who believed this adenopathy was reactive to the suspected infectious illness. __________________________  Melissa Middleton was here with her husband in follow-up for the diarrhea.  At this point, she is on Lomotil twice daily, colestipol twice daily, Bentyl twice daily and budesonide 9 mg daily.  She was apparently given tapering instructions for the budesonide from the GI physician [facility].  She also underwent a sigmoidoscopy within the last 2 weeks.  While we do not have records, she was told the scope and the biopsies were normal.  She is  still frustrated by the lack of a clear cause for the onset of this illness and persistence of diarrhea.  She has felt weak since the beginning of the illness and says that intermittently her blood pressure has been low. She denies chest pain lightheadedness or rectal bleeding.  ROS: Cardiovascular:  no chest pain Respiratory: no dyspnea Positive for fatigue Arthralgias Remainder systems negative except as above The patient's Past Medical, Family and Social History were reviewed and are on file in the EMR. Past Medical History:  Diagnosis Date   Allergic rhinitis    Anemia    Anxiety    Asthma    Benign neoplasm of stomach    gastric polyps   Diaphragmatic hernia without mention of obstruction or gangrene    Diverticulosis of colon (without mention of hemorrhage)    Esophageal reflux    see GI   GERD (gastroesophageal reflux disease)    Hiatal hernia    Insomnia    Osteoarthritis    sees ortho-has had steroid shot in knee   Pneumonia    PONV (postoperative nausea and vomiting)    Post menopausal problems    symptoms   Premenstrual tension syndromes    gyn uses alprazolam and spironolactone prn ofr treatment   Primary hyperparathyroidism ([employer])    Psoriasis    Psoriatic arthritis ([place])    Vitamin D deficiency     Objective:  Med list reviewed  Current Outpatient Medications:    albuterol (VENTOLIN HFA) 108 (90 Base) MCG/ACT inhaler, Inhale 2 puffs into the lungs every 4 (four) hours as needed for wheezing or shortness of breath., Disp: 18 g, Rfl: 1  ALPRAZolam (XANAX) 0.25 MG tablet, Take 1 tablet (0.25 mg total) by mouth at bedtime as needed for anxiety. TAKE 1 TABLET AT BEDTIME AS NEEDED FOR ANXIETY, Disp: 30 tablet, Rfl: 0   Ascorbic Acid (VITAMIN C PO), Take 1 tablet by mouth daily., Disp: , Rfl:    B Complex Vitamins (VITAMIN B COMPLEX PO), Take 1 tablet by mouth daily., Disp: , Rfl:    beclomethasone (QVAR) 40 MCG/ACT inhaler, Inhale 2 puffs into the lungs 2 (two)  times daily., Disp: 1 each, Rfl: 6   budesonide (ENTOCORT EC) 3 MG 24 hr capsule, Take by mouth., Disp: , Rfl:    celecoxib (CELEBREX) 200 MG capsule, Take 200 mg by mouth as needed for mild pain., Disp: , Rfl:    Cholecalciferol (VITAMIN D) 50 MCG (2000 UT) CAPS, Take 4,000 Units by mouth daily., Disp: , Rfl:    colestipol (COLESTID) 1 g tablet, Take 2 tablets (2 g total) by mouth 2 (two) times daily., Disp: 56 tablet, Rfl: 0   dicyclomine (BENTYL) 20 MG tablet, Take 20 mg by mouth every 6 (six) hours., Disp: , Rfl:    diphenoxylate-atropine (LOMOTIL) 2.5-0.025 MG tablet, Take 1 tablet by mouth every 4 (four) hours., Disp: 30 tablet, Rfl: 0   EPINEPHrine (EPIPEN 2-PAK) 0.3 mg/0.3 mL IJ SOAJ injection, Use as directed for severe allergic reactions (Patient taking differently: Inject 0.3 mg into the muscle as needed for anaphylaxis.), Disp: 2 Device, Rfl: 2   esomeprazole (NEXIUM) 40 MG capsule, one capsule (40 mg dose)., Disp: , Rfl:    Estradiol (VAGIFEM) 10 MCG TABS vaginal tablet, USE VAGINALLY 2 TIMES PER WK (Patient taking differently: Place 10 mcg vaginally. USE VAGINALLY 2 TIMES PER WK), Disp: 24 tablet, Rfl: 1   Ferrous Sulfate (IRON) 325 (65 Fe) MG TABS, 1 tablet Orally Once a day for 30 day(s), Disp: , Rfl:    fluticasone (FLONASE) 50 MCG/ACT nasal spray, TWO SPRAYS EACH NOSTRIL ONCE A DAY FOR NASAL CONGESTION OR DRAINAGE. (Patient taking differently: Place into both nostrils daily as needed for allergies.), Disp: 16 g, Rfl: 5   folic acid (FOLVITE) 1 MG tablet, Take 1 mg by mouth daily., Disp: , Rfl:    levocetirizine (XYZAL) 5 MG tablet, TAKE 1 TABLET BY MOUTH EVERY DAY IN THE EVENING (Patient taking differently: Take 5 mg by mouth every evening.), Disp: 90 tablet, Rfl: 1   methotrexate (RHEUMATREX) 2.5 MG tablet, Take 15 mg by mouth once a week., Disp: , Rfl:    montelukast (SINGULAIR) 10 MG tablet, TAKE 1 TABLET BY MOUTH EVERYDAY AT BEDTIME, Disp: 90 tablet, Rfl: 3   Multiple Vitamin  (MULTIVITAMIN WITH MINERALS) TABS tablet, Take 1 tablet by mouth daily., Disp: , Rfl:    nystatin (MYCOSTATIN/NYSTOP) powder, Apply 1 application topically 3 (three) times daily., Disp: 15 g, Rfl: 1   SIMPONI ARIA 50 MG/4ML SOLN injection, Inject 50 mg into the vein every 8 (eight) weeks., Disp: , Rfl:    spironolactone (ALDACTONE) 25 MG tablet, Take by mouth., Disp: , Rfl:    traZODone (DESYREL) 100 MG tablet, Take 1-1.5 tablets (100-150 mg total) by mouth at bedtime., Disp: 135 tablet, Rfl: 2   Zinc 100 MG TABS, 1 tablet Orally Once a day for 30 day(s), Disp: , Rfl:    zinc gluconate 50 MG tablet, Take 50 mg by mouth daily., Disp: , Rfl:    Vital signs in last 24 hrs: Vitals:   07/28/22 0833  BP: 110/82  Pulse: 74  SpO2: 98%   Wt Readings from Last 3 Encounters:  07/28/22 175 lb (79.4 kg)  07/26/22 173 lb 4.8 oz (78.6 kg)  06/28/22 185 lb 6.5 oz (84.1 kg)    Physical Exam  Well-appearing, no muscle wasting, well-hydrated HEENT: sclera anicteric, oral mucosa moist without lesions Neck: supple, no thyromegaly, JVD or lymphadenopathy Cardiac: Regular without murmur,  no peripheral edema Pulm: clear to auscultation bilaterally, normal RR and effort noted Abdomen: soft, no tenderness, with active bowel sounds. No guarding or palpable hepatosplenomegaly. Skin; warm and dry, no jaundice or rash  ___________________________________________ Radiologic studies:  Dr. Doyne Keel colonoscopy report was reviewed  Extensive inpatient notes, data and reports were reviewed  ____________________________________________ Other: A. COLON, RANDOM, BIOPSY:  -  Colonic mucosa with a prominent lamina propria lymphoplasmacytosis  and reactive/reparative change.   NOTE:  The clinical history of diarrhea is noted.  Morphologically while there  is a prominent lamina propria lymphoplasmacytosis and  reactive/reparative change, pathognomonic etiology for the diarrhea is  not identified.  There  are some minimally increased intraepithelial  lymphocytes which could raise the possibility of a microscopic colitis  (i.e. lymphocytic colitis) however the findings are not pathognomonic.  There is rare minimal activity present.  Definitive evidence of  chronicity is not identified.  There is no morphologic evidence of viral  cytopathic effect.  Basement membrane thickening is not identified.  The  morphologic findings are nonspecific and could represent a resolving  self-limited colitis however an early microscopic colitis cannot be  completely excluded.  Clinical correlation necessary.    _____________________________________________ Assessment & Plan  Assessment: Encounter Diagnoses  Name Primary?   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  At this point she has 1-2 formed stools per day and I think we should start scaling back her multiple medicines.  I explained the nature of this clinical scenario and how it may be months before she is back to normal, with some patients having a persistent IBS beyond that.  Plan: Decrease budesonide to 6 mg a day for 2 days, then 3 mg a day for 2 days, then stop (I had indicated she could stop it altogether, but she was reluctant to do so given the bothered by she received)  Decrease colestipol to once daily  Continue Bentyl and Lomotil at current doses.  Contact us by phone or portal message in 7 to 10 days with an update, and then I can determine further tapering of medicines.  35 minutes were spent on this encounter (including chart review, history/exam, counseling/coordination of care, and documentation) > 50% of that time was spent on counseling and coordination of care.   [name]

## 2022-07-29 NOTE — Telephone Encounter (Signed)
Left a message to return call.  

## 2022-07-29 NOTE — Telephone Encounter (Signed)
Spoke to patient to review the instructions given about the Colestipol. No further questions at this time.

## 2022-07-30 LAB — FOOD ALLERGY PROFILE
Allergen Corn, IgE: <0.1 kU/L
Clam IgE: 0.12 kU/L — AB
Codfish IgE: 0.17 kU/L — AB
Egg White IgE: 1.25 kU/L — AB
Milk IgE: 0.22 kU/L — AB
Scallop IgE: <0.1 kU/L
Sesame Seed IgE: 0.17 kU/L — AB
Shrimp IgE: <0.1 kU/L
Soybean IgE: <0.1 kU/L
Wheat IgE: 0.18 kU/L — AB

## 2022-07-30 LAB — IGE NUT PROF. W/COMPONENT RFLX
F017-IgE Hazelnut (Filbert): 0.14 kU/L — AB
F018-IgE Brazil Nut: <0.1 kU/L
F020-IgE Almond: 0.11 kU/L — AB
F202-IgE Cashew Nut: <0.1 kU/L
F203-IgE Pistachio Nut: <0.1 kU/L
F256-IgE Walnut: <0.1 kU/L
Macadamia Nut, IgE: <0.1 kU/L
Peanut, IgE: 0.27 kU/L — AB
Pecan Nut IgE: <0.1 kU/L

## 2022-07-30 LAB — PEANUT COMPONENTS
F352-IgE Ara h 8: <0.1 kU/L
F422-IgE Ara h 1: <0.1 kU/L
F423-IgE Ara h 2: <0.1 kU/L
F424-IgE Ara h 3: <0.1 kU/L
F427-IgE Ara h 9: <0.1 kU/L
F447-IgE Ara h 6: <0.1 kU/L

## 2022-07-30 LAB — ALLERGY PANEL 19, SEAFOOD GROUP
Allergen Salmon IgE: 0.22 kU/L — AB
Catfish: <0.1 kU/L
F023-IgE Crab: <0.1 kU/L
F080-IgE Lobster: <0.1 kU/L
Tuna: 0.63 kU/L — AB

## 2022-07-30 LAB — PANEL 604726
Cor A 1 IgE: 0.12 kU/L — AB
Cor A 14 IgE: <0.1 kU/L
Cor A 8 IgE: <0.1 kU/L
Cor A 9 IgE: <0.1 kU/L

## 2022-07-30 LAB — ALLERGEN COMPONENT COMMENTS

## 2022-08-01 NOTE — Progress Notes (Signed)
Blood work returned positive to hazelnut, peanut, almond,, salmon, tuna, egg, milk, clam, wheat, sesame, cod.  Allergy to peanut is less likely to be persistent or anaphylactic.  Allergy to hazelnut is more likely to be more persistent.  Although I think it is unlikely her diarrhea is caused by food allergies it is reasonable to try a short elimination diet based on the testing to see if diarrhea improves.  Would not avoid foods for longer than 2 weeks at a time and if diarrhea does not improve go ahead and reintroduce foods.  Can someone contact patient and let them know?

## 2022-08-02 NOTE — Telephone Encounter (Signed)
Inbound call from patient requesting a call back to discuss medication questions. Please advise.

## 2022-08-03 NOTE — Telephone Encounter (Signed)
[place] called to say she is doing well. Her stools have been better, she has had no accidents and feels good. She has stopped taking the Colestid. She is using the Lomotil and Bentyl BID and would like to know if we can start decreasing the doses. Please advise.

## 2022-08-03 NOTE — Telephone Encounter (Signed)
Left message to return call 

## 2022-08-04 NOTE — Telephone Encounter (Signed)
Glad to hear she is doing better.  Please have her decrease the Lomotil to 1 tablet once a day for 7 days, then discontinue it.  Contact us in 2 weeks with an update or sooner if needed.  HD

## 2022-08-04 NOTE — Telephone Encounter (Signed)
Patient agrees with the current treatment plan. She will give [place] an update or call sooner if needed.

## 2022-08-15 ENCOUNTER — Ambulatory Visit: Payer: 59 | Admitting: Family Medicine

## 2022-08-15 ENCOUNTER — Encounter: Payer: Self-pay | Admitting: Family Medicine

## 2022-08-15 VITALS — BP 118/76 | HR 84 | Temp 98.4°F | Ht 65.0 in | Wt 171.4 lb

## 2022-08-15 DIAGNOSIS — G4700 Insomnia, unspecified: Secondary | ICD-10-CM | POA: Diagnosis not present

## 2022-08-15 DIAGNOSIS — J4530 Mild persistent asthma, uncomplicated: Secondary | ICD-10-CM | POA: Diagnosis not present

## 2022-08-15 DIAGNOSIS — Z09 Encounter for follow-up examination after completed treatment for conditions other than malignant neoplasm: Secondary | ICD-10-CM

## 2022-08-15 MED ORDER — MONTELUKAST SODIUM 10 MG PO TABS: ORAL_TABLET | ORAL | 3 refills | Status: DC

## 2022-08-15 MED ORDER — TRAZODONE HCL 100 MG PO TABS: 100.0000 mg | ORAL_TABLET | Freq: Every day | ORAL | 2 refills | Status: DC

## 2022-08-15 NOTE — Patient Instructions (Signed)
Keep the diet clean and stay active.  Let us know if you need anything. 

## 2022-08-15 NOTE — Progress Notes (Signed)
Chief Complaint  Patient presents with   Follow-up    6 month     Subjective: Patient is a 66 y.o. female here for f/u.  Insomnia- Currently taking trazodone 100-150 mg qhs prn. Reports no AE's and good efficacy. Not following with therapist. No SI or HI, no self-medication.   Asthma- Currently taking Qvar intermittently.  She has forgotten to take her Singulair.  Reports no AE's. Using rescue inhaler <2x/week and <2x nightly per month. No recent hospitalization or steroid use.   Hospitalization The patient was admitted to [facility] long hospital on 06/17/2022 and discharged in 06/28/2022 for intractable diarrhea.  Unfortunately, no definitive cause was identified.  Her stools are currently normal and she is no longer on any medications affecting her stools.  She did follow-up with 2 separate gastroenterologist including a [facility] gastroenterologist.  She is now back with [facility] gastroenterology.  Blood cultures were all normal.  She was diagnosed with a UTI in the hospital due to the diarrhea causing infection.  She saw her allergist to ensure that nothing in her diet was contributing to these episodes of diarrhea.  She had 2 different types of testing, skin testing was negative and blood testing showed probably weak allergies to egg and dairy.  That being said, her bowel movements had resolved to normal and she was still continuing to ingest her normal amount of egg and dairy making her skeptical of these results.  She is not having any diarrhea, bleeding, abdominal pain, nausea, vomiting, or fevers.  Diet is back to normal.  Past Medical History:  Diagnosis Date   Allergic rhinitis    Anemia    Anxiety    Asthma    Benign neoplasm of stomach    gastric polyps   Diaphragmatic hernia without mention of obstruction or gangrene    Diverticulosis of colon (without mention of hemorrhage)    Esophageal reflux    see GI   GERD (gastroesophageal reflux disease)    Hiatal hernia    Insomnia     Osteoarthritis    sees ortho-has had steroid shot in knee   Pneumonia    PONV (postoperative nausea and vomiting)    Post menopausal problems    symptoms   Premenstrual tension syndromes    gyn uses alprazolam and spironolactone prn ofr treatment   Primary hyperparathyroidism ([place])    Psoriasis    Psoriatic arthritis ([facility])    Vitamin D deficiency     Objective: BP 118/76   Pulse 84   Temp 98.4 F (36.9 C) (Oral)   Ht '5\' 5"'$  (1.651 m)   Wt 171 lb 6 oz (77.7 kg)   SpO2 97%   BMI 28.52 kg/m  General: Awake, appears stated age Heart: RRR, no LE edema Lungs: CTAB, no rales, wheezes or rhonchi. No accessory muscle use Psych: Age appropriate judgment and insight, normal affect and mood  Assessment and Plan: Insomnia, unspecified type  Mild persistent asthma without complication  Hospital discharge follow-up  Chronic, stable. Cont trazodone 100-150 mg qhs prn. Chronic, stable.  Okay to go back on Singulair 10 mg/d, SABA prn.  If that does not control her symptoms, she can start Qvar again.  I would want her to be compliant with this though. We had a long discussion about her hospitalization, work-up, and additional allergy outpatient testing.  Based off of her history, she likely has weak positive testing to dairy and eggs.  I would not change her diet given her resolution of loose  stools and continued consumption. F/u in 6 mo or prn.  The patient voiced understanding and agreement to the plan.  I spent 45 minutes discussing the above with her in addition to reviewing her chart and the same day of the visit.  [place], DO 08/15/22  4:18 PM

## 2022-09-01 ENCOUNTER — Telehealth (HOSPITAL_BASED_OUTPATIENT_CLINIC_OR_DEPARTMENT_OTHER): Payer: Self-pay

## 2022-09-12 ENCOUNTER — Other Ambulatory Visit: Payer: Self-pay | Admitting: Nurse Practitioner

## 2022-09-13 NOTE — Telephone Encounter (Signed)
Medication refill request: xanax 0.'25mg'$  Last AEX:  11-30-21 Next AEX: 12-01-22 Last MMG (if hormonal medication request): n/a Refill authorized: rx last sent 07-20-22 #30 with 0 refills. Please approve or deny as appropriate

## 2022-09-30 ENCOUNTER — Telehealth: Payer: Self-pay | Admitting: Gastroenterology

## 2022-09-30 ENCOUNTER — Telehealth: Payer: Self-pay | Admitting: Family Medicine

## 2022-09-30 DIAGNOSIS — K529 Noninfective gastroenteritis and colitis, unspecified: Secondary | ICD-10-CM

## 2022-09-30 DIAGNOSIS — R195 Other fecal abnormalities: Secondary | ICD-10-CM

## 2022-09-30 NOTE — Telephone Encounter (Signed)
That's fine to refer. Ty.

## 2022-09-30 NOTE — Telephone Encounter (Signed)
Pt called stating that she would like to talk to Melissa Middleton about seeing a dietician to help with her diarrhea issues she has been having.

## 2022-09-30 NOTE — Telephone Encounter (Signed)
Called the patient and she would like a referral to see a Dietician. The last week the diarrhea has returned (she did call her GI doctor). She had too much grease this week/2 cupcakes and thinks this may be related to what she is eating. She would like a referral

## 2022-09-30 NOTE — Telephone Encounter (Signed)
She had a protracted course of a postinfectious diarrhea that finally improved on its own with symptomatic treatment.  She did not have microscopic colitis and does not need to resume the budesonide.  This condition has probably flared up on her (may be an infectious or food trigger), so I agree with resuming the Lomotil and the dicyclomine.  Less likely to be an infectious cause, but I recommend stool studies early next week with C. difficile PCR as well as ova and parasite study just to be sure.  - HD

## 2022-09-30 NOTE — Telephone Encounter (Signed)
Returned call to patient. She states that her diarrhea returned about a week ago. Pt states that she has noticed food triggers associated with her symptoms. Pt experiences abdominal cramping and diarrhea after any spicy, fried, greasy, or sweet foods. Pt states that she passed 6-8 stool last night, they were large volume diarrhea. Pt states that she took 2 Lomotil and 1 Dicyclomine this morning, and Budesonide 9 mg that she has at home. Pt states that her symptoms seemed to be controlled on that. I told pt that she can continue Lomotil and Bentyl PRN. I told pt to hold off on the Budesonide at this time. I encouraged patient to avoid known food triggers, I told pt that she can try a low FODMAP diet. I also told pt that she can also try IB Donald Prose OTC if she has not tried that. Pt is aware that I will send her concerns to Dr. Loletha Carrow for him to review.  I told pt that I was not sure if you would want to order stool studies or not. Pt is aware that I have sent low FODMAP diet to her MyChart. Pt knows that I will be in contact once you provide recommendations, she is aware that this will likely be Monday. Pt verbalized understanding. Please advise, thanks.

## 2022-09-30 NOTE — Telephone Encounter (Signed)
She sees GI could they not help her with this if diet related.

## 2022-09-30 NOTE — Telephone Encounter (Signed)
Inbound call from patient stating that she would like to speak with the nurse regarding the severe diarrhea she has been having since June. Patient is requesting a call back to discuss. Please advise.

## 2022-10-03 NOTE — Telephone Encounter (Signed)
Called left message to call back 

## 2022-10-03 NOTE — Telephone Encounter (Signed)
Lm on vm for patient to return call.  Stool study orders in epic.

## 2022-10-03 NOTE — Addendum Note (Signed)
Addended by: Yevette Edwards on: 10/03/2022 08:58 AM   Modules accepted: Orders

## 2022-10-03 NOTE — Telephone Encounter (Signed)
Pt returned call. We reviewed Dr. Loletha Carrow' recommendations. Pt states that she does not think that she has parasites or C. Diff. She went out to a festival over the weekend and took Colestid and Lomotil and that controlled her symptoms. Pt is aware that stool study orders are in epic if she changes her mind about submitting the samples. Pt advised to continue medications PRN. Pt verbalized understanding and had no concerns at the end of the call.

## 2022-10-03 NOTE — Telephone Encounter (Signed)
Patient returned your call. Please call to advise.

## 2022-10-03 NOTE — Telephone Encounter (Signed)
Spoke to the patient and she prefers to be referred to Melissa Middleton. She states GI is only wanting to do another stool test.

## 2022-10-04 NOTE — Telephone Encounter (Signed)
Referral done

## 2022-10-04 NOTE — Telephone Encounter (Signed)
What diagnosis do I use for her referral

## 2022-10-28 ENCOUNTER — Other Ambulatory Visit: Payer: Self-pay | Admitting: Nurse Practitioner

## 2022-10-28 NOTE — Telephone Encounter (Signed)
Last annual exam was 11/30/2021

## 2022-10-29 ENCOUNTER — Other Ambulatory Visit: Payer: Self-pay | Admitting: Family Medicine

## 2022-11-01 ENCOUNTER — Other Ambulatory Visit: Payer: 59

## 2022-11-17 ENCOUNTER — Telehealth: Payer: Self-pay | Admitting: Gastroenterology

## 2022-11-17 MED ORDER — ESOMEPRAZOLE MAGNESIUM 40 MG PO CPDR: 40.0000 mg | DELAYED_RELEASE_CAPSULE | Freq: Every day | ORAL | 0 refills | Status: DC

## 2022-11-17 NOTE — Telephone Encounter (Signed)
Inbound call from patient stating she is really uncomfortable with gas on "both ends". Patient states the gas has been constant, and is very uncomfortable at night when she tries to sleep.Wanting something prescribed. Please advise.

## 2022-11-17 NOTE — Telephone Encounter (Signed)
Not so sure how much this is likely to help with gas, but if she feels it has been successful in the past, then I am willing to try.  Prescription sent   - HD

## 2022-11-17 NOTE — Telephone Encounter (Signed)
Returned call to patient. She states that she is very gassy. She takes 1-2 Gas-X daily which seem to help some but does not resolve her symptoms. I informed pt that she can take up to 4 Gas-X a day. Pt has been advised to avoid foods that produce gas. Pt states that Nexium seems to help her symptoms. She is not having reflux symptoms but she does get periodic epigastric pain. I told pt that if Nexium helped her she could resume it. Pt states that it is cheaper for her to get a 90 day supply prescription. Pt states that she used to receive Nexium 40 mg daily from her PCP but she is wanting to know if you would be willing to prescribe this for her? If so, she would like RX [facility]. Otherwise her previous episodes of diarrhea have resolved. Please advise, thanks.

## 2022-11-18 NOTE — Telephone Encounter (Signed)
Left patient a detailed vm letting her know that prescription was [facility] to [facility] in [place]. I advised pt to call back if she had any additional questions or concerns.

## 2022-11-19 ENCOUNTER — Other Ambulatory Visit: Payer: Self-pay | Admitting: Nurse Practitioner

## 2022-11-19 DIAGNOSIS — N951 Menopausal and female climacteric states: Secondary | ICD-10-CM

## 2022-11-22 NOTE — Telephone Encounter (Signed)
Medication refill request: vagifem 30mg Last AEX:  11-30-21 Next AEX: 12-01-22 Last MMG (if hormonal medication request): 10-21-22 neg (care everywhere) Refill authorized: please approve if appropriate

## 2022-12-01 ENCOUNTER — Ambulatory Visit (INDEPENDENT_AMBULATORY_CARE_PROVIDER_SITE_OTHER): Payer: 59 | Admitting: Nurse Practitioner

## 2022-12-01 ENCOUNTER — Encounter: Payer: Self-pay | Admitting: Nurse Practitioner

## 2022-12-01 VITALS — BP 116/78 | Ht 64.0 in | Wt 175.0 lb

## 2022-12-01 DIAGNOSIS — Z01419 Encounter for gynecological examination (general) (routine) without abnormal findings: Secondary | ICD-10-CM | POA: Diagnosis not present

## 2022-12-01 DIAGNOSIS — N951 Menopausal and female climacteric states: Secondary | ICD-10-CM | POA: Diagnosis not present

## 2022-12-01 MED ORDER — ESTRADIOL 10 MCG VA TABS: 1.0000 | ORAL_TABLET | VAGINAL | 4 refills | Status: DC

## 2022-12-01 NOTE — Progress Notes (Signed)
   Melissa Middleton 05-23-1956 [id number]   History:  66 y.o. G2P2002 presents for annual exam. Postmenopausal. Using Vagifem tablet for vaginal dryness but has not been using consistently. Normal pap and mammogram history.  Hyperparathyroidism managed by endocrinology, psoriatic arthritis managed by rheumatology.   Gynecologic History No LMP recorded. Patient postmenopausal.   Contraception/Family planning: post menopausal status Sexually active: No  Health Maintenance Last Pap: 11/30/2021. Results were: Normal neg HPV Last mammogram: 10/21/2022. Results were: Normal Last colonoscopy: 06/20/2022 Last Dexa: 06/04/2020. Results were: Normal  Past medical history, past surgical history, family history and social history were all reviewed and documented in the EPIC chart. Married. 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  Sister with history of ovarian cancer.   ROS:  A ROS was performed and pertinent positives and negatives are included.  Exam:  Vitals:   12/01/22 1325  BP: 116/78  Weight: 175 lb (79.4 kg)  Height: '5\' 4"'$  (1.626 m)     Body mass index is 30.04 kg/m.  General appearance:  Normal Thyroid:  Symmetrical, normal in size, without palpable masses or nodularity. Respiratory  Auscultation:  Clear without wheezing or rhonchi Cardiovascular  Auscultation:  Regular rate, without rubs, murmurs or gallops  Edema/varicosities:  Not grossly evident Abdominal  Soft,nontender, without masses, guarding or rebound.  Liver/spleen:  No organomegaly noted  Hernia:  None appreciated  Skin  Inspection:  Redness along lower abdominal fold c/w fungal infection   Breasts: Examined lying and sitting.   Right: Without masses, retractions, discharge or axillary adenopathy.   Left: Without masses, retractions, discharge or axillary adenopathy. Genitourinary   Inguinal/mons:  Normal without inguinal adenopathy  External genitalia:  Normal appearing  vulva with no masses, tenderness, or lesions. Atrophic changes  BUS/Urethra/Skene's glands:  Normal  Vagina:  Normal appearing with normal color and discharge, no lesions. Atrophic changes  Cervix:  Normal appearing without discharge or lesions  Uterus:  Normal in size, shape and contour.  Midline and mobile, nontender  Adnexa/parametria:     Rt: Normal in size, without masses or tenderness.   Lt: Normal in size, without masses or tenderness.  Anus and perineum: Normal  Digital rectal exam: Deferred  Patient informed chaperone available to be present for breast and pelvic exam. Patient has requested no chaperone to be present. Patient has been advised what will be completed during breast and pelvic exam.   Assessment/Plan:  66 y.o. G2P2002 for annual  exam.   Well female exam with routine gynecological exam -Education provided on SBEs, importance of preventative screenings, current guidelines, high calcium diet, regular exercise, and multivitamin daily. Labs with PCP and rheumatology.   Menopausal vaginal dryness - Plan: Estradiol (VAGIFEM) 10 MCG TABS vaginal tablet - Not using consistently. She experiences vaginal dryness and irritation. We again discussed effectiveness with consistent use and she plans to use twice weekly as recommended.   Screening for cervical cancer - Normal Pap history.  Will repeat at 3-year intervals per guidelines due to use of immunosuppressants.   Screening for breast cancer - Normal mammogram history.  Continue annual screenings.  Normal breast exam today.  Screening for colon cancer - Colonoscopy 06/2022. Will repeat at GI's recommended interval.   Screening for osteoporosis - Normal bone density 05/2020. Will repeat at 5-year interval per recommendation.   Follow-up in 1 year for annual.   [place], 1:56 PM 12/01/2022

## 2022-12-29 ENCOUNTER — Ambulatory Visit: Payer: 59 | Admitting: Gastroenterology

## 2022-12-30 ENCOUNTER — Other Ambulatory Visit: Payer: Self-pay | Admitting: Nurse Practitioner

## 2022-12-30 NOTE — Telephone Encounter (Signed)
Last AEX 12/01/2022--nothing scheduled yet for 2024. However, recall has been placed.

## 2023-02-14 ENCOUNTER — Ambulatory Visit: Payer: 59 | Admitting: Family Medicine

## 2023-02-15 ENCOUNTER — Ambulatory Visit: Payer: 59 | Admitting: Family Medicine

## 2023-02-17 ENCOUNTER — Other Ambulatory Visit: Payer: Self-pay | Admitting: Gastroenterology

## 2023-02-20 ENCOUNTER — Other Ambulatory Visit: Payer: Self-pay | Admitting: Radiology

## 2023-02-20 NOTE — Telephone Encounter (Signed)
AEX 12/01/22.

## 2023-02-27 ENCOUNTER — Encounter: Payer: Self-pay | Admitting: Family Medicine

## 2023-02-28 ENCOUNTER — Encounter: Payer: Self-pay | Admitting: Family Medicine

## 2023-02-28 ENCOUNTER — Ambulatory Visit: Payer: 59 | Admitting: Family Medicine

## 2023-02-28 VITALS — BP 112/62 | HR 78 | Temp 98.1°F | Ht 65.0 in | Wt 179.5 lb

## 2023-02-28 DIAGNOSIS — R058 Other specified cough: Secondary | ICD-10-CM | POA: Diagnosis not present

## 2023-02-28 DIAGNOSIS — R5383 Other fatigue: Secondary | ICD-10-CM | POA: Diagnosis not present

## 2023-02-28 LAB — VITAMIN B12: Vitamin B-12: 761 pg/mL (ref 211–911)

## 2023-02-28 LAB — IBC + FERRITIN
Ferritin: 42.7 ng/mL (ref 10.0–291.0)
Iron: 105 ug/dL (ref 42–145)
Saturation Ratios: 29.9 % (ref 20.0–50.0)
TIBC: 351.4 ug/dL (ref 250.0–450.0)
Transferrin: 251 mg/dL (ref 212.0–360.0)

## 2023-02-28 LAB — VITAMIN D 25 HYDROXY (VIT D DEFICIENCY, FRACTURES): VITD: 37.36 ng/mL (ref 30.00–100.00)

## 2023-02-28 MED ORDER — PREDNISONE 20 MG PO TABS: 40.0000 mg | ORAL_TABLET | Freq: Every day | ORAL | 0 refills | Status: AC

## 2023-02-28 MED ORDER — FLUTICASONE PROPIONATE 50 MCG/ACT NA SUSP: 2.0000 | Freq: Every day | NASAL | 5 refills | Status: DC | PRN

## 2023-02-28 NOTE — Patient Instructions (Signed)
Give us 2-3 business days to get the results of your labs back.   Keep the diet clean and stay active.  Stay hydrated.  Let us know if you need anything.  

## 2023-02-28 NOTE — Progress Notes (Signed)
Chief Complaint  Patient presents with   Follow-up    Post Covid Symptoms    Subjective: Patient is a 67 y.o. female here for f/u covid.  Had covid 4 weeks ago. She has had extreme fatigue until recently. Getting slightly better. No SOB, wheezing, coughing, runny/stuffy nose, ST, ear pain/drainage, N/V/D. Took Mucinex and her allergy regimen at home.   Past Medical History:  Diagnosis Date   Allergic rhinitis    Anemia    Anxiety    Asthma    Benign neoplasm of stomach    gastric polyps   Diaphragmatic hernia without mention of obstruction or gangrene    Diverticulosis of colon (without mention of hemorrhage)    Esophageal reflux    see GI   GERD (gastroesophageal reflux disease)    Hiatal hernia    Insomnia    Osteoarthritis    sees ortho-has had steroid shot in knee   Pneumonia    PONV (postoperative nausea and vomiting)    Post menopausal problems    symptoms   Premenstrual tension syndromes    gyn uses alprazolam and spironolactone prn ofr treatment   Primary hyperparathyroidism (HCC)    Psoriasis    Psoriatic arthritis (HCC)    Vitamin D deficiency     Objective: BP 112/62 (BP Location: Left Arm, Patient Position: Sitting, Cuff Size: Normal)   Pulse 78   Temp 98.1 F (36.7 C) (Oral)   Ht '5\' 5"'$  (1.651 m)   Wt 179 lb 8 oz (81.4 kg)   SpO2 97%   BMI 29.87 kg/m  General: Awake, appears stated age Heart: RRR, no LE edema Lungs: CTAB, no rales, wheezes or rhonchi. No accessory muscle use Psych: Age appropriate judgment and insight, normal affect and mood  Assessment and Plan: Post-viral cough syndrome - Plan: predniSONE (DELTASONE) 20 MG tablet  Fatigue, unspecified type - Plan: B12, VITAMIN D 25 Hydroxy (Vit-D Deficiency, Fractures), IBC + Ferritin  5 d pred burst 40 mg/d.  Ck labs. Counseled on diet/exercise. Stay hydrated. May be improving on own.  The patient voiced understanding and agreement to the plan.  [place], DO 02/28/23   11:22 AM

## 2023-04-10 ENCOUNTER — Telehealth: Payer: Self-pay | Admitting: Gastroenterology

## 2023-04-10 NOTE — Telephone Encounter (Signed)
Inbound call from patient, states she was recently hospitalized for diarrhea, and would like to speak with a nurse in regards. States she is back on Colestid 3G twice a day, and Dicyclomine 1 pill every 6 hours. She would like to know if medication dosage is correct. Did not wish to schedule an appointment at this time, wished to speak to a nurse before.

## 2023-04-11 ENCOUNTER — Other Ambulatory Visit: Payer: Self-pay

## 2023-04-11 DIAGNOSIS — K529 Noninfective gastroenteritis and colitis, unspecified: Secondary | ICD-10-CM

## 2023-04-11 NOTE — Telephone Encounter (Signed)
Pt states she is having diarrhea like she did when she was in the hospital last year. She wanted to know how she was taking the colestid and bentyl previously. DIscussed with her the prescription had stated 2 colestid BID and bentyl  every 6 hours prn. She reports she has been doing this and it has helped about 50%. States she is having urgency and about 4 yellow watery stools/day. States this has been going on for about at week and a half. Pt thinks it has something to do with her lower back and her psoriatic arthritis, and possibly covid. She wanted to know if Dr. Myrtie Neither had any other recommendations. Please advise.

## 2023-04-11 NOTE — Telephone Encounter (Signed)
Spoke with pt and she is aware of recommendations per Dr. Myrtie Neither. Order in epic for lab.

## 2023-04-11 NOTE — Telephone Encounter (Signed)
Difficult to say.  She had a prolonged course of postinfectious diarrhea last year.  So this could be a post-COVID phenomenon, but it would seem unusual for it to begin this long after the COVID infection that occurred in late February/early March.  I also see from February ED visit for facial pain that she had had some dental work and received antibiotics.  So if she has not had any stool studies done since the diarrhea started, then please arrange a C. difficile PCR.  That is negative, she can change from Bentyl to Imodium.  If ongoing issues, please make an office appointment.  -HD

## 2023-04-12 ENCOUNTER — Other Ambulatory Visit: Payer: Self-pay | Admitting: Radiology

## 2023-04-12 NOTE — Telephone Encounter (Signed)
RF request received for alprazolam 0.25mg .  Last RF 02/21/24.  AEX 12/01/22.  Advise if ok for refill.

## 2023-05-02 ENCOUNTER — Other Ambulatory Visit: Payer: Self-pay | Admitting: Family Medicine

## 2023-05-25 ENCOUNTER — Other Ambulatory Visit: Payer: Self-pay | Admitting: Radiology

## 2023-05-25 ENCOUNTER — Other Ambulatory Visit: Payer: Self-pay | Admitting: Family Medicine

## 2023-05-25 DIAGNOSIS — F419 Anxiety disorder, unspecified: Secondary | ICD-10-CM

## 2023-05-25 NOTE — Telephone Encounter (Signed)
Med refill request: alprazolam 0.25mg  Last AEX: 12/01/22 Refill authorized: Please Advise?

## 2023-07-13 ENCOUNTER — Other Ambulatory Visit: Payer: Self-pay | Admitting: Family Medicine

## 2023-08-23 ENCOUNTER — Other Ambulatory Visit: Payer: Self-pay | Admitting: Nurse Practitioner

## 2023-08-23 DIAGNOSIS — F419 Anxiety disorder, unspecified: Secondary | ICD-10-CM

## 2023-08-23 NOTE — Telephone Encounter (Signed)
Med refill request: Xanax Last AEX: 12/01/2022 Next AEX: nothing currently scheduled, recall placed for 11/2023. Last MMG (if hormonal med): n/a Refill authorized: rx pend.

## 2023-08-31 ENCOUNTER — Other Ambulatory Visit: Payer: Self-pay | Admitting: Family Medicine

## 2023-09-27 ENCOUNTER — Other Ambulatory Visit: Payer: Self-pay | Admitting: Family Medicine

## 2023-12-20 ENCOUNTER — Other Ambulatory Visit: Payer: Self-pay | Admitting: Nurse Practitioner

## 2023-12-20 DIAGNOSIS — F419 Anxiety disorder, unspecified: Secondary | ICD-10-CM

## 2023-12-21 NOTE — Telephone Encounter (Signed)
 Med refill request: Xanax Last AEX: 12/01/2022 Next AEX: 01/09/24 Last MMG (if hormonal med): 10/20/21 Refill authorized: #30, 1 RF

## 2024-01-09 ENCOUNTER — Ambulatory Visit (INDEPENDENT_AMBULATORY_CARE_PROVIDER_SITE_OTHER): Payer: 59 | Admitting: Nurse Practitioner

## 2024-01-09 ENCOUNTER — Encounter: Payer: Self-pay | Admitting: Nurse Practitioner

## 2024-01-09 VITALS — BP 102/70 | HR 78 | Ht 64.0 in | Wt 184.0 lb

## 2024-01-09 DIAGNOSIS — N951 Menopausal and female climacteric states: Secondary | ICD-10-CM

## 2024-01-09 DIAGNOSIS — Z01419 Encounter for gynecological examination (general) (routine) without abnormal findings: Secondary | ICD-10-CM

## 2024-01-09 DIAGNOSIS — Z780 Asymptomatic menopausal state: Secondary | ICD-10-CM

## 2024-01-09 MED ORDER — ESTRADIOL 10 MCG VA TABS: 1.0000 | ORAL_TABLET | VAGINAL | 4 refills | Status: DC

## 2024-01-09 NOTE — Progress Notes (Signed)
Melissa Middleton 1956-01-17 [id number]   History:  68 y.o. G2P2002 presents for annual exam. Postmenopausal. Using Vagifem tablet for vaginal dryness. Normal pap and mammogram history.  Hyperparathyroidism managed by endocrinology, psoriatic arthritis managed by rheumatology. Seeing [facility] for bowel issues/allergies.   Gynecologic History No LMP recorded. Patient postmenopausal.   Contraception/Family planning: post menopausal status Sexually active: No  Health Maintenance Last Pap: 11/30/2021. Results were: Normal neg HPV, 3-year repeat Last mammogram: 10/21/2022. Results were: Normal Last colonoscopy: 2024 Last Dexa: 06/04/2020. Results were: Normal  Past medical history, past surgical history, family history and social history were all reviewed and documented in the EPIC chart. Married. Retired. Daughter works in mammography, has 1 yo daughter, son due April 2025. 22 yo son in radiology program at [employer]. Sister with history of ovarian cancer.   ROS:  A ROS was performed and pertinent positives and negatives are included.  Exam:  Vitals:   01/09/24 1514  BP: 102/70  Pulse: 78  SpO2: 98%  Weight: 184 lb (83.5 kg)  Height: 5\' 4"  (1.626 m)      Body mass index is 31.58 kg/m.  General appearance:  Normal Thyroid:  Symmetrical, normal in size, without palpable masses or nodularity. Respiratory  Auscultation:  Clear without wheezing or rhonchi Cardiovascular  Auscultation:  Regular rate, without rubs, murmurs or gallops  Edema/varicosities:  Not grossly evident Abdominal  Soft,nontender, without masses, guarding or rebound.  Liver/spleen:  No organomegaly noted  Hernia:  None appreciated  Skin  Inspection:  Redness along lower abdominal fold c/w fungal infection   Breasts: Examined lying and sitting.   Right: Without masses, retractions, discharge or axillary adenopathy.   Left: Without masses, retractions, discharge or axillary adenopathy. Pelvic:  External genitalia:  no lesions              Urethra:  normal appearing urethra with no masses, tenderness or lesions              Bartholins and Skenes: normal                 Vagina: normal appearing vagina with normal color and discharge, no lesions. Atrophic changes              Cervix: no lesions Bimanual Exam:  Uterus:  no masses or tenderness              Adnexa: no mass, fullness, tenderness              Rectovaginal: Deferred              Anus:  normal, no lesions  Patient informed chaperone available to be present for breast and pelvic exam. Patient has requested no chaperone to be present. Patient has been advised what will be completed during breast and pelvic exam.   Assessment/Plan:  68 y.o. G2P2002 for annual  exam.   Well female exam with routine gynecological exam -Education provided on SBEs, importance of preventative screenings, current guidelines, high calcium diet, regular exercise, and multivitamin daily. Labs with PCP and rheumatology.   Menopausal vaginal dryness - Plan: Estradiol (VAGIFEM) 10 MCG TABS vaginal tablet - Not using consistently. She experiences vaginal dryness and irritation. We again discussed effectiveness with consistent use and she plans to use twice weekly as recommended.   Screening for cervical cancer - Normal Pap history.  Will repeat at 3-year intervals per guidelines due to use of immunosuppressants.   Screening for breast cancer - Normal mammogram history.  Overdue and plans to schedule. Normal breast exam today.  Screening for colon cancer - Colonoscopy 2024 per patient. Will repeat at GI's recommended interval.   Screening for osteoporosis - Normal bone density 05/2020. Will repeat at 5-year interval per recommendation.   Return in about 1 year (around 01/08/2025) for Annual.   Olivia Mackie [facility], 3:42 PM 01/09/2024

## 2024-02-14 ENCOUNTER — Other Ambulatory Visit: Payer: Self-pay | Admitting: Family Medicine

## 2024-02-28 ENCOUNTER — Ambulatory Visit: Admitting: Family Medicine

## 2024-03-01 ENCOUNTER — Encounter: Payer: Self-pay | Admitting: Family Medicine

## 2024-03-01 ENCOUNTER — Ambulatory Visit: Admitting: Family Medicine

## 2024-03-01 VITALS — BP 128/70 | HR 89 | Temp 98.3°F | Ht 64.0 in | Wt 181.4 lb

## 2024-03-01 DIAGNOSIS — F411 Generalized anxiety disorder: Secondary | ICD-10-CM

## 2024-03-01 DIAGNOSIS — L409 Psoriasis, unspecified: Secondary | ICD-10-CM

## 2024-03-01 DIAGNOSIS — G8929 Other chronic pain: Secondary | ICD-10-CM

## 2024-03-01 DIAGNOSIS — M542 Cervicalgia: Secondary | ICD-10-CM

## 2024-03-01 DIAGNOSIS — G4700 Insomnia, unspecified: Secondary | ICD-10-CM

## 2024-03-01 DIAGNOSIS — L309 Dermatitis, unspecified: Secondary | ICD-10-CM | POA: Insufficient documentation

## 2024-03-01 MED ORDER — BUSPIRONE HCL 7.5 MG PO TABS: 7.5000 mg | ORAL_TABLET | Freq: Two times a day (BID) | ORAL | 2 refills | Status: DC

## 2024-03-01 MED ORDER — BETAMETHASONE DIPROPIONATE AUG 0.05 % EX OINT: TOPICAL_OINTMENT | Freq: Two times a day (BID) | CUTANEOUS | 2 refills | Status: AC

## 2024-03-01 NOTE — Progress Notes (Signed)
 Chief Complaint  Patient presents with   Follow-up    Patient presents today for a year follow-up    Subjective: Patient is a 68 y.o. female here for anxiety.  Hx of anxiety since HS. Worsening over past months. No specific incident flaring things up. Takes Xanax from GYN intermittently. Wakes up feeling on edge. Does not follow with a therapist at this time. No HI or SI. No self medication. She takes trazodone 100-150 mg qhs for sleep which does help.   She has a hx of psoriasis and needs a refill of her steroid cream. Works well, no AE's. She is due to see her dermatology team.   Chronic neck pain. Saw spine specialist and told it wasn't that bad. Massage helps. No neuro s/s's. Also saw ortho and was sent to PT but she never went. Interested in seeing Tilda Franco in [place].   Past Medical History:  Diagnosis Date   Allergic rhinitis    Anemia    Anxiety    Asthma    Benign neoplasm of stomach    gastric polyps   Diaphragmatic hernia without mention of obstruction or gangrene    Diverticulosis of colon (without mention of hemorrhage)    Esophageal reflux    see GI   GERD (gastroesophageal reflux disease)    Hiatal hernia    Insomnia    Osteoarthritis    sees ortho-has had steroid shot in knee   Pneumonia    PONV (postoperative nausea and vomiting)    Post menopausal problems    symptoms   Premenstrual tension syndromes    gyn uses alprazolam and spironolactone prn ofr treatment   Primary hyperparathyroidism (HCC)    Psoriasis    Psoriatic arthritis (HCC)    Vitamin D deficiency     Objective: BP 128/70   Pulse 89   Temp 98.3 F (36.8 C)   Ht 5\' 4"  (1.626 m)   Wt 181 lb 6.4 oz (82.3 kg)   SpO2 98%   BMI 31.14 kg/m  General: Awake, appears stated age MSK: +TTP over cerv parasp msc. Heart: RRR, no LE edema Lungs: CTAB, no rales, wheezes or rhonchi. No accessory muscle use Psych: Age appropriate judgment and insight, normal affect and mood  Assessment and  Plan: Insomnia, unspecified type  Psoriasis - Plan: augmented betamethasone dipropionate (DIPROLENE-AF) 0.05 % ointment  GAD (generalized anxiety disorder) - Plan: busPIRone (BUSPAR) 7.5 MG tablet  Chronic neck pain - Plan: Ambulatory referral to Physical Therapy  Cont Trazodone 100-150 mg qhs.  Appreciate dermatology. Will refill her betamethasone. Chronic, uncontrolled. Counseling info provided. Start BuSpar 7.5 mg bid. F/u in 1 mo to recheck.  Refer PT.  The patient voiced understanding and agreement to the plan.  Jilda Roche [place], DO 03/01/24  2:50 PM

## 2024-03-01 NOTE — Patient Instructions (Addendum)
 Keep the diet clean and stay active.  Aim to do some physical exertion for 150 minutes per week. This is typically divided into 5 days per week, 30 minutes per day. The activity should be enough to get your heart rate up. Anything is better than nothing if you have time constraints.  If you do not hear anything about your referral in the next 1-2 weeks, call our office and ask for an update.  Please consider counseling. Contact [phone] to schedule an appointment or inquire about cost/insurance coverage.  [facility] located at [address], [address], [place], [place].  Phone number = [phone].  Dr. Regan Lemming - Adult Psychiatry.    [facility] located at [address] [place], [place], [place]. Phone number = [phone].   [facility] located at [address] [place], [place].  Phone number = [phone].   [facility] located at [address] [place], [place], [place].  Phone number = [phone].  Let us know if you need anything.

## 2024-03-15 ENCOUNTER — Encounter: Payer: Self-pay | Admitting: Family Medicine

## 2024-03-15 LAB — HM MAMMOGRAPHY

## 2024-03-25 ENCOUNTER — Other Ambulatory Visit: Payer: Self-pay | Admitting: Family Medicine

## 2024-03-25 DIAGNOSIS — F411 Generalized anxiety disorder: Secondary | ICD-10-CM

## 2024-03-28 ENCOUNTER — Other Ambulatory Visit: Payer: Self-pay | Admitting: Nurse Practitioner

## 2024-03-28 DIAGNOSIS — F419 Anxiety disorder, unspecified: Secondary | ICD-10-CM

## 2024-03-28 NOTE — Telephone Encounter (Signed)
 Med refill request: xanax  Last AEX: 01/09/24 Next AEX:01/09/25 Last MMG (if hormonal med) 03/15/24 birads cat 2 benign  Refill authorized: Last rx 12/21/23 #30 with 1 refill. Please approve or deny

## 2024-04-01 ENCOUNTER — Ambulatory Visit: Payer: Self-pay | Admitting: Family Medicine

## 2024-04-16 ENCOUNTER — Other Ambulatory Visit: Payer: Self-pay | Admitting: Family Medicine

## 2024-07-24 ENCOUNTER — Other Ambulatory Visit: Payer: Self-pay | Admitting: Nurse Practitioner

## 2024-07-24 DIAGNOSIS — F419 Anxiety disorder, unspecified: Secondary | ICD-10-CM

## 2024-07-24 NOTE — Telephone Encounter (Signed)
 Med refill request: Xanax   Last AEX: 01/09/24  Next AEX: 01/14/25 Last MMG (if hormonal med) 03/15/24 BIRADS Cat 2 benign  Refill authorized: Last rx 03/28/24 #30 with 1 refill. Please approve or deny

## 2024-08-03 ENCOUNTER — Other Ambulatory Visit: Payer: Self-pay | Admitting: Family Medicine

## 2024-08-03 DIAGNOSIS — F411 Generalized anxiety disorder: Secondary | ICD-10-CM

## 2024-08-21 ENCOUNTER — Other Ambulatory Visit: Payer: Self-pay | Admitting: Family Medicine

## 2024-09-11 ENCOUNTER — Other Ambulatory Visit: Payer: Self-pay | Admitting: Family Medicine

## 2024-09-11 NOTE — Telephone Encounter (Signed)
 Copied from CRM #[id number]. Topic: Clinical - Medication Refill >> Sep 11, 2024  2:02 PM Dedra B wrote: Medication: levocetirizine (XYZAL ) 5 MG tablet (requesting 90 days)  Has the patient contacted their pharmacy? Yes, no refills   This is the patient's preferred pharmacy:  [id number] - [place], [place] - [address] [place] [address] Phone: [phone] Fax: [phone]  Is this the correct pharmacy for this prescription? Yes   Has the prescription been filled recently? No  Is the patient out of the medication? Yes  Has the patient been seen for an appointment in the last year OR does the patient have an upcoming appointment? Yes  Can we respond through MyChart? Yes  Agent: Please be advised that Rx refills may take up to 3 business days. We ask that you follow-up with your pharmacy.

## 2024-09-16 ENCOUNTER — Ambulatory Visit: Admitting: Family Medicine

## 2024-09-16 ENCOUNTER — Encounter: Payer: Self-pay | Admitting: Family Medicine

## 2024-09-16 VITALS — BP 124/72 | HR 77 | Temp 95.8°F | Resp 16 | Ht 64.0 in | Wt 175.4 lb

## 2024-09-16 DIAGNOSIS — G4700 Insomnia, unspecified: Secondary | ICD-10-CM | POA: Diagnosis not present

## 2024-09-16 DIAGNOSIS — N3941 Urge incontinence: Secondary | ICD-10-CM | POA: Diagnosis not present

## 2024-09-16 MED ORDER — LEVOCETIRIZINE DIHYDROCHLORIDE 5 MG PO TABS: 5.0000 mg | ORAL_TABLET | Freq: Every evening | ORAL | 3 refills | Status: AC

## 2024-09-16 MED ORDER — OXYBUTYNIN CHLORIDE ER 5 MG PO TB24: 5.0000 mg | ORAL_TABLET | Freq: Every day | ORAL | 1 refills | Status: DC

## 2024-09-16 NOTE — Progress Notes (Signed)
 Chief Complaint  Patient presents with   Follow-up    Follow Up    Subjective Melissa Middleton presents for f/u insomnia.   Patient has a history of insomnia.  She is currently taking trazodone  100-150 mg nightly as needed.  Compliant, no adverse effects.  Working well. Pt is not following with a counselor/psychologist.  Has been dealing with stool and urine incontinence. Diet is OK. Sugary and fatty foods trigger it. Has been going on for a few years. Comes and goes. No bleeding, pain, N/V, wt loss.   Past Medical History:  Diagnosis Date   Allergic rhinitis    Anemia    Anxiety    Asthma    Benign neoplasm of stomach    gastric polyps   Diaphragmatic hernia without mention of obstruction or gangrene    Diverticulosis of colon (without mention of hemorrhage)    Esophageal reflux    see GI   GERD (gastroesophageal reflux disease)    Hiatal hernia    Insomnia    Osteoarthritis    sees ortho-has had steroid shot in knee   Pneumonia    PONV (postoperative nausea and vomiting)    Post menopausal problems    symptoms   Premenstrual tension syndromes    gyn uses alprazolam  and spironolactone  prn ofr treatment   Primary hyperparathyroidism    Psoriasis    Psoriatic arthritis (HCC)    Vitamin D  deficiency    Allergies as of 09/16/2024       Reactions   Doxycycline  Rash   Pt states she can take   Macrodantin [nitrofurantoin Macrocrystal] Itching, Rash   Cashew Nut (anacardium Occidentale) Skin Test Rash   Cefuroxime Axetil Rash, Other (See Comments)   REACTION: red face/rash   Flavoring Agent (non-screening) Itching, Rash   Peanut -containing Drug Products Rash   Penicillins Rash   Tolerated Ancef  intra op 05/04/2021   Shellfish Allergy  Itching, Rash   Redness        Medication List        Accurate as of September 16, 2024  1:49 PM. If you have any questions, ask your nurse or doctor.          STOP taking these medications    busPIRone  7.5 MG  tablet Commonly known as: BUSPAR  Stopped by: Mabel [place] Jiron       TAKE these medications    albuterol  108 (90 Base) MCG/ACT inhaler Commonly known as: VENTOLIN  HFA Inhale 2 puffs into the lungs every 4 (four) hours as needed for wheezing or shortness of breath.   ALPRAZolam  0.25 MG tablet Commonly known as: XANAX  TAKE 1 TABLET BY MOUTH EVERY DAY AT BEDTIME AS NEEDED FOR ANXIETY   augmented betamethasone  dipropionate 0.05 % ointment Commonly known as: DIPROLENE -AF Apply topically 2 (two) times daily.   beclomethasone 40 MCG/ACT inhaler Commonly known as: QVAR  Inhale 2 puffs into the lungs 2 (two) times daily.   BIOTIN PO Take by mouth.   celecoxib 200 MG capsule Commonly known as: CELEBREX Take 200 mg by mouth as needed for mild pain.   EPINEPHrine  0.3 mg/0.3 mL Soaj injection Commonly known as: EpiPen  2-Pak Use as directed for severe allergic reactions What changed:  how much to take how to take this when to take this reasons to take this additional instructions   Estradiol  10 MCG Tabs vaginal tablet Commonly known as: Vagifem  Place 1 tablet (10 mcg total) vaginally 2 (two) times a week.   fluticasone  50 MCG/ACT nasal spray Commonly  known as: FLONASE  PLACE 2 SPRAYS INTO BOTH NOSTRILS DAILY AS NEEDED FOR ALLERGIES OR RHINITIS.   folic acid 1 MG tablet Commonly known as: FOLVITE Take 1 mg by mouth daily.   Iron 325 (65 Fe) MG Tabs 1 tablet Orally Once a day for 30 day(s)   levocetirizine 5 MG tablet Commonly known as: XYZAL  Take 1 tablet (5 mg total) by mouth every evening. Needs appt   methotrexate 2.5 MG tablet Commonly known as: RHEUMATREX Take 15 mg by mouth once a week.   montelukast  10 MG tablet Commonly known as: SINGULAIR  TAKE 1 TABLET BY MOUTH EVERYDAY AT BEDTIME   multivitamin with minerals Tabs tablet Take 1 tablet by mouth daily.   nystatin  powder Commonly known as: MYCOSTATIN /NYSTOP  Apply 1 application topically 3 (three)  times daily.   oxybutynin 5 MG 24 hr tablet Commonly known as: DITROPAN-XL Take 1 tablet (5 mg total) by mouth at bedtime. Started by: Mabel Deward Pry   PROBIOTIC PO Take by mouth.   progesterone 100 MG capsule Commonly known as: PROMETRIUM Take 100 mg by mouth at bedtime.   Simponi Aria 50 MG/4ML Soln injection Generic drug: golimumab Inject 50 mg into the vein every 8 (eight) weeks.   spironolactone  25 MG tablet Commonly known as: ALDACTONE  Take by mouth.   traZODone  100 MG tablet Commonly known as: DESYREL  TAKE 1-1.5 TABLETS (100-150 MG TOTAL) BY MOUTH AT BEDTIME.   VITAMIN B COMPLEX PO Take 1 tablet by mouth daily.   VITAMIN C PO Take 1 tablet by mouth daily.   Vitamin D  50 MCG (2000 UT) Caps Take 4,000 Units by mouth daily.   Zinc 100 MG Tabs 1 tablet Orally Once a day for 30 day(s)        Exam BP 124/72 (BP Location: Left Arm, Patient Position: Sitting)   Pulse 77   Temp (!) 95.8 F (35.4 C) (Temporal)   Resp 16   Ht 5' 4 (1.626 m)   Wt 175 lb 6.4 oz (79.6 kg)   SpO2 97%   BMI 30.11 kg/m  General:  well developed, well nourished, in no apparent distress Heart: RRR Abd: BS+, S, NT, ND Lungs: CTAB. No respiratory distress Psych: well oriented with normal range of affect and age-appropriate judgement/insight, alert and oriented x4.  Assessment and Plan  Insomnia, unspecified type  Urge incontinence  Chronic, stable.  Continue trazodone  100-150 mg nightly as needed.   Chronic, unstable. Start oxybutynin 5 mg/d. Hopefully helps w stools too. F/u in 1 mo. The patient voiced understanding and agreement to the plan.  Mabel Deward [place], DO 09/16/24 1:49 PM

## 2024-09-16 NOTE — Patient Instructions (Addendum)
 Keep the diet clean and stay active.  Take Metamucil or Benefiber daily.  Let us  know if you need anything.

## 2024-10-08 ENCOUNTER — Other Ambulatory Visit: Payer: Self-pay | Admitting: Family Medicine

## 2024-10-08 DIAGNOSIS — N3941 Urge incontinence: Secondary | ICD-10-CM

## 2024-10-23 ENCOUNTER — Ambulatory Visit: Admitting: Family Medicine

## 2024-11-16 ENCOUNTER — Other Ambulatory Visit: Payer: Self-pay | Admitting: Family Medicine

## 2024-11-22 ENCOUNTER — Other Ambulatory Visit: Payer: Self-pay | Admitting: Nurse Practitioner

## 2024-11-22 DIAGNOSIS — F419 Anxiety disorder, unspecified: Secondary | ICD-10-CM

## 2024-11-22 NOTE — Telephone Encounter (Signed)
 Med refill request: alprazolam  0.25 mg Last AEX: 01/09/24 Next AEX: 01/15/25 Last MMG (if hormonal med) 03/15/24 BI-RADS 2 benign DX: anxiety Refill sent to provider for approval or denial.

## 2024-11-27 ENCOUNTER — Ambulatory Visit: Admitting: Family Medicine

## 2025-01-09 ENCOUNTER — Ambulatory Visit: Payer: 59 | Admitting: Nurse Practitioner

## 2025-01-14 ENCOUNTER — Ambulatory Visit: Admitting: Nurse Practitioner

## 2025-01-15 ENCOUNTER — Ambulatory Visit: Payer: Self-pay | Admitting: Nurse Practitioner

## 2025-01-15 ENCOUNTER — Encounter: Payer: Self-pay | Admitting: Nurse Practitioner

## 2025-01-15 VITALS — BP 124/78 | HR 101 | Ht 65.0 in | Wt 179.0 lb

## 2025-01-15 DIAGNOSIS — Z124 Encounter for screening for malignant neoplasm of cervix: Secondary | ICD-10-CM

## 2025-01-15 DIAGNOSIS — N3941 Urge incontinence: Secondary | ICD-10-CM | POA: Diagnosis not present

## 2025-01-15 DIAGNOSIS — N951 Menopausal and female climacteric states: Secondary | ICD-10-CM | POA: Diagnosis not present

## 2025-01-15 DIAGNOSIS — Z780 Asymptomatic menopausal state: Secondary | ICD-10-CM

## 2025-01-15 DIAGNOSIS — Z01419 Encounter for gynecological examination (general) (routine) without abnormal findings: Secondary | ICD-10-CM | POA: Diagnosis not present

## 2025-01-15 MED ORDER — ESTRADIOL 10 MCG VA TABS: 1.0000 | ORAL_TABLET | VAGINAL | 4 refills | Status: AC

## 2025-01-15 NOTE — Progress Notes (Signed)
 "  Melissa Middleton Mar 27, 1956 [id number]   History:  69 y.o. G2P2002 presents for breast and pelvic exam. Postmenopausal. Using Vagifem  tablet for vaginal dryness but not consistent with it. Husband takes diabetic medications causing ED. Taking Prometrium nighlty, has not started estrogen patch - prescribed by Melissa Middleton. Normal pap history.  Hyperparathyroidism managed by endocrinology, psoriatic arthritis managed by rheumatology. Also sees [facility] for bowel issues/allergies.   Gynecologic History No LMP recorded. Patient postmenopausal.   Contraception/Family planning: post menopausal status Sexually active: No  Health Maintenance Last Pap: 11/30/2021. Results were: Normal neg HPV, 3-year repeat Last mammogram: 03/15/2024. Results were: Normal Last colonoscopy: 2024 Last Dexa: 06/04/2020. Results were: Normal  Past medical history, past surgical history, family history and social history were all reviewed and documented in the EPIC chart. Married. Retired. Daughter works in mammography at [employer], has 56 yo daughter, son 42 months old. 31 yo son, works in information systems manager at [employer]. Sister with history of ovarian cancer.   ROS:  A ROS was performed and pertinent positives and negatives are included.  Exam:  Vitals:   01/15/25 1548  BP: 124/78  Pulse: (!) 101  SpO2: 97%  Weight: 179 lb (81.2 kg)  Height: 5' 5 (1.651 m)    Body mass index is 29.79 kg/m.  General appearance:  Normal Thyroid :  Symmetrical, normal in size, without palpable masses or nodularity. Respiratory  Auscultation:  Clear without wheezing or rhonchi Cardiovascular  Auscultation:  Regular rate, without rubs, murmurs or gallops  Edema/varicosities:  Not grossly evident Abdominal  Soft,nontender, without masses, guarding or rebound.  Liver/spleen:  No organomegaly noted  Hernia:  None appreciated  Skin  Inspection:  Redness along lower abdominal fold c/w fungal infection   Breasts: Examined lying and  sitting.   Right: Without masses, retractions, discharge or axillary adenopathy.   Left: Without masses, retractions, discharge or axillary adenopathy. Pelvic: External genitalia:  no lesions              Urethra:  normal appearing urethra with no masses, tenderness or lesions              Bartholins and Skenes: normal                 Vagina: normal appearing vagina with normal color and discharge, no lesions. Atrophic changes. Grade 1 cystocele              Cervix: no lesions Bimanual Exam:  Uterus:  no masses or tenderness              Adnexa: no mass, fullness, tenderness              Rectovaginal: Deferred              Anus:  normal, no lesions  Dereck Middleton, CMA present as chaperone.   Assessment/Plan:  69 y.o. [id number] for breast and pelvic exam.  Encounter for breast and pelvic examination - Education provided on SBEs, importance of preventative screenings, current guidelines, high calcium diet, regular exercise, and multivitamin daily. Labs with PCP and rheumatology.   Menopausal vaginal dryness - Plan: Estradiol  (VAGIFEM ) 10 MCG TABS vaginal tablet - Not using consistently. She experiences vaginal dryness and irritation. We again discussed effectiveness with consistent use and she plans to use twice weekly as recommended.   Postmenopausal - started Prometrium, prescribed by Melissa Middleton. Did not start estrogen patch.   Cervical cancer screening - Plan: Cytology - PAP( [place]). Pap pending.   Urge  incontinence - PCP prescribed Ditropan  but patient never started. Considering starting now.   Screening for breast cancer - Normal mammogram history. Continue annual screenings. Normal breast exam today.  Screening for colon cancer - Colonoscopy 2024 per patient. Will repeat at GI's recommended interval.   Screening for osteoporosis - Normal bone density 05/2020. Will repeat at 5-year interval per recommendation.   Return in about 1 year (around 01/15/2026) for Med follow  up.    Melissa Middleton, 4:47 PM 01/15/2025 "

## 2025-01-17 LAB — CYTOLOGY - PAP: Adequacy: ABNORMAL

## 2025-01-21 ENCOUNTER — Ambulatory Visit: Payer: Self-pay | Admitting: Nurse Practitioner

## 2025-01-29 ENCOUNTER — Ambulatory Visit: Admitting: Nurse Practitioner

## 2025-01-30 ENCOUNTER — Ambulatory Visit: Admitting: Nurse Practitioner

## 2025-03-19 ENCOUNTER — Encounter: Admitting: Family Medicine

## 2025-03-20 ENCOUNTER — Encounter: Admitting: Family Medicine

## 2026-01-19 ENCOUNTER — Ambulatory Visit: Admitting: Nurse Practitioner

## 2043-05-12 ENCOUNTER — Encounter (HOSPITAL_BASED_OUTPATIENT_CLINIC_OR_DEPARTMENT_OTHER): Payer: 59
